# Patient Record
Sex: Male | Born: 1970
Health system: Southern US, Community
[De-identification: ages and names within clinical notes are randomized; demographics above are authoritative.]

## PROBLEM LIST (undated history)

## (undated) DIAGNOSIS — Z87442 Personal history of urinary calculi: Secondary | ICD-10-CM

## (undated) DIAGNOSIS — G8929 Other chronic pain: Secondary | ICD-10-CM

## (undated) DIAGNOSIS — C801 Malignant (primary) neoplasm, unspecified: Secondary | ICD-10-CM

## (undated) DIAGNOSIS — M5412 Radiculopathy, cervical region: Secondary | ICD-10-CM

## (undated) DIAGNOSIS — Z8489 Family history of other specified conditions: Secondary | ICD-10-CM

## (undated) DIAGNOSIS — R4689 Other symptoms and signs involving appearance and behavior: Secondary | ICD-10-CM

## (undated) DIAGNOSIS — N200 Calculus of kidney: Secondary | ICD-10-CM

## (undated) DIAGNOSIS — R011 Cardiac murmur, unspecified: Secondary | ICD-10-CM

## (undated) DIAGNOSIS — J45909 Unspecified asthma, uncomplicated: Secondary | ICD-10-CM

## (undated) DIAGNOSIS — F4310 Post-traumatic stress disorder, unspecified: Secondary | ICD-10-CM

## (undated) DIAGNOSIS — M549 Dorsalgia, unspecified: Secondary | ICD-10-CM

## (undated) HISTORY — PX: EPIDURAL BLOCK INJECTION: SHX1516

## (undated) HISTORY — DX: Other symptoms and signs involving appearance and behavior: R46.89

## (undated) HISTORY — PX: SHOULDER SURGERY: SHX246

## (undated) HISTORY — PX: COLONOSCOPY: SHX174

## (undated) MED FILL — Dexamethasone Sodium Phosphate Inj 100 MG/10ML: INTRAMUSCULAR | Qty: 1 | Status: AC

## (undated) MED FILL — Fosaprepitant Dimeglumine For IV Infusion 150 MG (Base Eq): INTRAVENOUS | Qty: 5 | Status: AC

---

## 2004-09-11 ENCOUNTER — Emergency Department (HOSPITAL_COMMUNITY): Admission: EM | Admit: 2004-09-11 | Discharge: 2004-09-11 | Payer: Self-pay | Admitting: Emergency Medicine

## 2006-11-21 ENCOUNTER — Emergency Department (HOSPITAL_COMMUNITY): Admission: EM | Admit: 2006-11-21 | Discharge: 2006-11-21 | Payer: Self-pay | Admitting: Family Medicine

## 2006-11-22 ENCOUNTER — Emergency Department (HOSPITAL_COMMUNITY): Admission: EM | Admit: 2006-11-22 | Discharge: 2006-11-22 | Payer: Self-pay | Admitting: Emergency Medicine

## 2008-08-27 ENCOUNTER — Emergency Department (HOSPITAL_COMMUNITY): Admission: EM | Admit: 2008-08-27 | Discharge: 2008-08-27 | Payer: Self-pay | Admitting: Emergency Medicine

## 2008-09-07 ENCOUNTER — Emergency Department (HOSPITAL_COMMUNITY): Admission: EM | Admit: 2008-09-07 | Discharge: 2008-09-07 | Payer: Self-pay | Admitting: Emergency Medicine

## 2008-11-12 ENCOUNTER — Emergency Department (HOSPITAL_COMMUNITY): Admission: EM | Admit: 2008-11-12 | Discharge: 2008-11-12 | Payer: Self-pay | Admitting: Emergency Medicine

## 2008-11-13 ENCOUNTER — Emergency Department (HOSPITAL_COMMUNITY): Admission: EM | Admit: 2008-11-13 | Discharge: 2008-11-13 | Payer: Self-pay | Admitting: Emergency Medicine

## 2009-06-23 ENCOUNTER — Emergency Department (HOSPITAL_COMMUNITY): Admission: EM | Admit: 2009-06-23 | Discharge: 2009-06-23 | Payer: Self-pay | Admitting: Emergency Medicine

## 2009-06-24 ENCOUNTER — Emergency Department (HOSPITAL_COMMUNITY): Admission: EM | Admit: 2009-06-24 | Discharge: 2009-06-24 | Payer: Self-pay | Admitting: Emergency Medicine

## 2009-08-02 ENCOUNTER — Emergency Department (HOSPITAL_COMMUNITY): Admission: EM | Admit: 2009-08-02 | Discharge: 2009-08-02 | Payer: Self-pay | Admitting: Internal Medicine

## 2010-06-06 ENCOUNTER — Emergency Department (HOSPITAL_COMMUNITY): Admission: EM | Admit: 2010-06-06 | Discharge: 2010-06-07 | Payer: Self-pay | Admitting: Emergency Medicine

## 2010-08-17 ENCOUNTER — Emergency Department (HOSPITAL_COMMUNITY): Admission: EM | Admit: 2010-08-17 | Discharge: 2010-08-17 | Payer: Self-pay | Admitting: Emergency Medicine

## 2010-08-20 ENCOUNTER — Emergency Department (HOSPITAL_COMMUNITY): Admission: EM | Admit: 2010-08-20 | Discharge: 2010-08-20 | Payer: Self-pay | Admitting: Emergency Medicine

## 2010-12-22 ENCOUNTER — Emergency Department (HOSPITAL_COMMUNITY)
Admission: EM | Admit: 2010-12-22 | Discharge: 2010-12-22 | Disposition: A | Discharge status: Home / Self Care | Payer: Self-pay | Attending: Emergency Medicine | Admitting: Emergency Medicine

## 2010-12-22 DIAGNOSIS — R079 Chest pain, unspecified: Secondary | ICD-10-CM | POA: Insufficient documentation

## 2010-12-22 DIAGNOSIS — M546 Pain in thoracic spine: Secondary | ICD-10-CM | POA: Insufficient documentation

## 2010-12-22 DIAGNOSIS — S239XXA Sprain of unspecified parts of thorax, initial encounter: Secondary | ICD-10-CM | POA: Insufficient documentation

## 2010-12-22 DIAGNOSIS — X500XXA Overexertion from strenuous movement or load, initial encounter: Secondary | ICD-10-CM | POA: Insufficient documentation

## 2010-12-22 DIAGNOSIS — J45909 Unspecified asthma, uncomplicated: Secondary | ICD-10-CM | POA: Insufficient documentation

## 2010-12-22 LAB — URINALYSIS, ROUTINE W REFLEX MICROSCOPIC
Bilirubin Urine: NEGATIVE
Hgb urine dipstick: NEGATIVE
Ketones, ur: NEGATIVE mg/dL
Nitrite: NEGATIVE
Protein, ur: NEGATIVE mg/dL
Specific Gravity, Urine: 1.022 (ref 1.005–1.030)
Urine Glucose, Fasting: NEGATIVE mg/dL
Urobilinogen, UA: 0.2 mg/dL (ref 0.0–1.0)
pH: 6 (ref 5.0–8.0)

## 2011-01-15 LAB — DIFFERENTIAL
Basophils Absolute: 0.1 10*3/uL (ref 0.0–0.1)
Basophils Relative: 1 % (ref 0–1)
Eosinophils Absolute: 0.3 10*3/uL (ref 0.0–0.7)
Eosinophils Relative: 4 % (ref 0–5)
Lymphocytes Relative: 46 % (ref 12–46)
Lymphs Abs: 3.5 10*3/uL (ref 0.7–4.0)
Monocytes Absolute: 0.8 10*3/uL (ref 0.1–1.0)
Monocytes Relative: 10 % (ref 3–12)
Neutro Abs: 3 10*3/uL (ref 1.7–7.7)
Neutrophils Relative %: 39 % — ABNORMAL LOW (ref 43–77)

## 2011-01-15 LAB — BASIC METABOLIC PANEL
BUN: 16 mg/dL (ref 6–23)
CO2: 29 mEq/L (ref 19–32)
Calcium: 9 mg/dL (ref 8.4–10.5)
Chloride: 103 mEq/L (ref 96–112)
Creatinine, Ser: 1.06 mg/dL (ref 0.4–1.5)
GFR calc Af Amer: >60 mL/min (ref >60)
GFR calc non Af Amer: >60 mL/min (ref >60)
Glucose, Bld: 98 mg/dL (ref 70–99)
Potassium: 4 mEq/L (ref 3.5–5.1)
Sodium: 137 mEq/L (ref 135–145)

## 2011-01-15 LAB — CBC
HCT: 40.2 % (ref 39.0–52.0)
Hemoglobin: 13.2 g/dL (ref 13.0–17.0)
MCH: 29.3 pg (ref 26.0–34.0)
MCHC: 32.8 g/dL (ref 30.0–36.0)
MCV: 89.1 fL (ref 78.0–100.0)
Platelets: 261 10*3/uL (ref 150–400)
RBC: 4.51 MIL/uL (ref 4.22–5.81)
RDW: 13.5 % (ref 11.5–15.5)
WBC: 7.7 10*3/uL (ref 4.0–10.5)

## 2011-01-15 LAB — ETHANOL: Alcohol, Ethyl (B): <5 mg/dL (ref 0–10)

## 2011-02-08 LAB — URINALYSIS, ROUTINE W REFLEX MICROSCOPIC
Bilirubin Urine: NEGATIVE
Glucose, UA: NEGATIVE mg/dL
Ketones, ur: 15 mg/dL — AB
Leukocytes, UA: NEGATIVE
Nitrite: NEGATIVE
Protein, ur: NEGATIVE mg/dL
Specific Gravity, Urine: 1.022 (ref 1.005–1.030)
Urobilinogen, UA: 0.2 mg/dL (ref 0.0–1.0)
pH: 5.5 (ref 5.0–8.0)

## 2011-02-08 LAB — URINE MICROSCOPIC-ADD ON

## 2011-09-28 ENCOUNTER — Emergency Department (HOSPITAL_COMMUNITY)
Admission: EM | Admit: 2011-09-28 | Discharge: 2011-09-28 | Disposition: A | Discharge status: Home / Self Care | Payer: Managed Care, Other (non HMO) | Attending: Emergency Medicine | Admitting: Emergency Medicine

## 2011-09-28 ENCOUNTER — Encounter: Payer: Self-pay | Admitting: *Deleted

## 2011-09-28 ENCOUNTER — Emergency Department (HOSPITAL_COMMUNITY): Payer: Managed Care, Other (non HMO)

## 2011-09-28 DIAGNOSIS — M19019 Primary osteoarthritis, unspecified shoulder: Secondary | ICD-10-CM

## 2011-09-28 DIAGNOSIS — M6580 Other synovitis and tenosynovitis, unspecified site: Secondary | ICD-10-CM | POA: Insufficient documentation

## 2011-09-28 DIAGNOSIS — M259 Joint disorder, unspecified: Secondary | ICD-10-CM | POA: Insufficient documentation

## 2011-09-28 DIAGNOSIS — M25529 Pain in unspecified elbow: Secondary | ICD-10-CM | POA: Insufficient documentation

## 2011-09-28 DIAGNOSIS — M25519 Pain in unspecified shoulder: Secondary | ICD-10-CM | POA: Insufficient documentation

## 2011-09-28 DIAGNOSIS — R296 Repeated falls: Secondary | ICD-10-CM | POA: Insufficient documentation

## 2011-09-28 DIAGNOSIS — M778 Other enthesopathies, not elsewhere classified: Secondary | ICD-10-CM

## 2011-09-28 MED ORDER — DIAZEPAM 5 MG PO TABS: 5.0000 mg | ORAL_TABLET | Freq: Three times a day (TID) | ORAL | Status: AC | PRN

## 2011-09-28 MED ORDER — OXYCODONE-ACETAMINOPHEN 5-325 MG PO TABS
1.0000 | ORAL_TABLET | Freq: Once | ORAL | Status: AC
  Administered 2011-09-28: 1 via ORAL
  Filled 2011-09-28: qty 1

## 2011-09-28 MED ORDER — IBUPROFEN 800 MG PO TABS
800.0000 mg | ORAL_TABLET | Freq: Once | ORAL | Status: AC
  Administered 2011-09-28: 800 mg via ORAL
  Filled 2011-09-28: qty 1

## 2011-09-28 MED ORDER — IBUPROFEN 800 MG PO TABS: 800.0000 mg | ORAL_TABLET | Freq: Three times a day (TID) | ORAL | Status: AC

## 2011-09-28 MED ORDER — OXYCODONE-ACETAMINOPHEN 5-325 MG PO TABS: 2.0000 | ORAL_TABLET | ORAL | Status: AC | PRN

## 2011-09-28 MED ORDER — DIAZEPAM 5 MG PO TABS
5.0000 mg | ORAL_TABLET | Freq: Once | ORAL | Status: AC
  Administered 2011-09-28: 5 mg via ORAL
  Filled 2011-09-28: qty 1

## 2011-09-28 NOTE — ED Notes (Signed)
Pt shows no signs of neuro deficits
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
## 2011-09-28 NOTE — ED Notes (Signed)
Patient with c/o bilateral arm pain.  He states that he fell backwards two days ago and his right elbow and left arm are painful.  Patient took two vicodin at home but didn't help

## 2011-11-04 DIAGNOSIS — J9819 Other pulmonary collapse: Secondary | ICD-10-CM

## 2011-11-04 HISTORY — DX: Other pulmonary collapse: J98.19

## 2012-02-03 ENCOUNTER — Emergency Department (HOSPITAL_COMMUNITY)
Admission: EM | Admit: 2012-02-03 | Discharge: 2012-02-04 | Disposition: A | Discharge status: Home / Self Care | Payer: Self-pay | Attending: Emergency Medicine | Admitting: Emergency Medicine

## 2012-02-03 ENCOUNTER — Encounter (HOSPITAL_COMMUNITY): Payer: Self-pay | Admitting: *Deleted

## 2012-02-03 DIAGNOSIS — X58XXXA Exposure to other specified factors, initial encounter: Secondary | ICD-10-CM | POA: Insufficient documentation

## 2012-02-03 DIAGNOSIS — M722 Plantar fascial fibromatosis: Secondary | ICD-10-CM | POA: Insufficient documentation

## 2012-02-03 DIAGNOSIS — S161XXA Strain of muscle, fascia and tendon at neck level, initial encounter: Secondary | ICD-10-CM

## 2012-02-03 DIAGNOSIS — F17200 Nicotine dependence, unspecified, uncomplicated: Secondary | ICD-10-CM | POA: Insufficient documentation

## 2012-02-03 DIAGNOSIS — S139XXA Sprain of joints and ligaments of unspecified parts of neck, initial encounter: Secondary | ICD-10-CM | POA: Insufficient documentation

## 2012-02-03 NOTE — ED Notes (Signed)
Neck back chest pain and many other symptoms.  Rapid speech not stopping

## 2012-02-03 NOTE — ED Notes (Signed)
The pt has had both feet pain for  2 months.

## 2012-02-03 NOTE — ED Notes (Addendum)
Pt comes to the ED with multiple complaints:  Pt complaining of foot pain bilaterally for the past month - area where pain is the worst is on the "pads" and the "arch".  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  Pt is also reporting neck pain and says that he is unable to turn his neck to the left for the past five days.  The chest pain has been going on for the past week and describes pain as "annoyingly sharp and pressure and roll around almost seems like gas".  He states that his left arm goes numb at night.

## 2012-02-04 MED ORDER — DIAZEPAM 5 MG PO TABS: 5.0000 mg | ORAL_TABLET | Freq: Two times a day (BID) | ORAL | Status: AC

## 2012-02-04 MED ORDER — NAPROXEN 500 MG PO TABS: 500.0000 mg | ORAL_TABLET | Freq: Two times a day (BID) | ORAL | Status: DC | PRN

## 2012-02-04 NOTE — Discharge Instructions (Signed)

## 2012-02-12 NOTE — ED Provider Notes (Signed)
History    41 year old male with chief complaint of bilateral foot pain. Has been bothering him intermittently for the past several months. Worse with the last 2 days. Has been seen previously for the same complaint diagnosis of plantar fascia is. Pain is in the soles of his feet. Is exacerbated with palpation and movement. Patient states that he spends several hours a day on his feet. Denies any acute trauma. No unusual swelling or rash. No fevers or chills. No nausea or vomiting. Patient is also complaining of right lateral neck pain. Patient again has a history of similar pain in the past. Again denies acute trauma. No numbness, tingling or loss of strength. Review of symptoms patient is also complaining of intermittent sharp chest pain. No appreciable exacerbating relieving factors. No shortness of breath. Abdominal pain. Diffuse and crampy, feels like gas. Intermittent mild headaches. Sometimes his left arm goes numb but he attributes this to ackward sleeping position at night.  CSN: [id number]  Arrival date & time 02/03/12  2136   First MD Initiated Contact with Patient 02/03/12 2350      Chief Complaint  Patient presents with  . multiple symptoms     (Consider location/radiation/quality/duration/timing/severity/associated sxs/prior treatment) HPI  History reviewed. No pertinent past medical history.  History reviewed. No pertinent past surgical history.  No family history on file.  History  Substance Use Topics  . Smoking status: Passive Smoker  . Smokeless tobacco: Not on file  . Alcohol Use: Yes      Review of Systems   Review of symptoms negative unless otherwise noted in HPI.   Allergies  Bee and Vancomycin  Home Medications   Current Outpatient Rx  Name Route Sig Dispense Refill  . CYCLOBENZAPRINE HCL 10 MG PO TABS Oral Take 5 mg by mouth 3 (three) times daily as needed. For pain    . DIAZEPAM 5 MG PO TABS Oral Take 10 mg by mouth every 6 (six) hours as  needed. For pain    . IBUPROFEN 200 MG PO TABS Oral Take 800 mg by mouth every 6 (six) hours as needed. For pain     . IBUPROFEN 800 MG PO TABS Oral Take 800 mg by mouth every 8 (eight) hours as needed. For pain    . OXYCODONE-ACETAMINOPHEN 5-325 MG PO TABS Oral Take 1 tablet by mouth every 4 (four) hours as needed. For pain    . DIAZEPAM 5 MG PO TABS Oral Take 1 tablet (5 mg total) by mouth 2 (two) times daily. 10 tablet 0  . NAPROXEN 500 MG PO TABS Oral Take 1 tablet (500 mg total) by mouth 2 (two) times daily as needed. 30 tablet 0    BP 128/93  Pulse 73  Temp(Src) 98 F (36.7 C) (Oral)  Resp 16  SpO2 100%  Physical Exam  Nursing note and vitals reviewed. Constitutional: He appears well-developed and well-nourished. No distress.       Laying in bed. No acute distress.  HENT:  Head: Normocephalic and atraumatic.  Right Ear: External ear normal.  Left Ear: External ear normal.  Nose: Nose normal.  Mouth/Throat: Oropharynx is clear and moist.       Long dreadlocks. Tenderness along the right lateral neck. Full range of motion. No overlying skin changes. No midline spinal tenderness.  Eyes: Conjunctivae and EOM are normal. Pupils are equal, round, and reactive to light. Right eye exhibits no discharge. Left eye exhibits no discharge.  Neck: Neck supple.  Cardiovascular: Normal rate,  regular rhythm and normal heart sounds.  Exam reveals no gallop and no friction rub.   No murmur heard. Pulmonary/Chest: Effort normal and breath sounds normal. No respiratory distress.  Abdominal: Soft. He exhibits no distension. There is no tenderness.  Musculoskeletal: He exhibits no edema and no tenderness.       Diffuse tenderness along the plantar surface of both feet from area at the distal calcaneus 2 heads of the metacarpals. No pedal edema. No tenderness or swelling dorsally. Good DP pulses bilaterally. No calf tenderness. Leg symmetric as compared to each other.  Neurological: He is alert.    Skin: Skin is warm and dry. He is not diaphoretic.  Psychiatric: He has a normal mood and affect. His behavior is normal. Thought content normal.    ED Course  Procedures (including critical care time)  Labs Reviewed - No data to display No results found.   1. Neck strain   2. Plantar fasciitis       MDM  41 year old male with multiple complaints. Chief complaint which seems to be bilateral foot pain. Suspect plantar fascia this. Patient has a history of this and has been treated previously. No evidence of infection. Doubt DVT. Patient's also complaining of neck pain which I believe is strain. There is no history of trauma. He has a nonfocal neurological examination. Review of systems is positive for many other things including chest pain, headaches, abdominal pain and others. Very low suspicion for emergent etiology of these. Will treat for possible plantar fasciitis and cervical strain. Return precautions were discussed. Outpatient followup.        Virgel Manifold, MD 02/12/12 1426

## 2012-06-24 ENCOUNTER — Emergency Department (HOSPITAL_COMMUNITY)
Admission: EM | Admit: 2012-06-24 | Discharge: 2012-06-24 | Disposition: A | Discharge status: Home / Self Care | Payer: Self-pay | Attending: Emergency Medicine | Admitting: Emergency Medicine

## 2012-06-24 ENCOUNTER — Encounter (HOSPITAL_COMMUNITY): Payer: Self-pay | Admitting: Emergency Medicine

## 2012-06-24 DIAGNOSIS — M62838 Other muscle spasm: Secondary | ICD-10-CM | POA: Insufficient documentation

## 2012-06-24 DIAGNOSIS — F17200 Nicotine dependence, unspecified, uncomplicated: Secondary | ICD-10-CM | POA: Insufficient documentation

## 2012-06-24 MED ORDER — METHOCARBAMOL 500 MG PO TABS
1000.0000 mg | ORAL_TABLET | Freq: Once | ORAL | Status: AC
Start: 2012-06-24 — End: 2012-06-24
  Administered 2012-06-24: 1000 mg via ORAL
  Filled 2012-06-24: qty 2

## 2012-06-24 MED ORDER — METHOCARBAMOL 750 MG PO TABS: 750.0000 mg | ORAL_TABLET | Freq: Two times a day (BID) | ORAL | Status: AC

## 2012-06-24 NOTE — ED Provider Notes (Signed)
History     CSN: [id number]  Arrival date & time 06/24/12  0311   First MD Initiated Contact with Patient 06/24/12 [phone]      Chief Complaint  Patient presents with  . Neck Pain    (Consider location/radiation/quality/duration/timing/severity/associated sxs/prior treatment) HPI Pt with a work related injury in 5/13 and has persistent back pain. Pt is being followed and prescribed percocet which are not relieving all of his pain. He has had persistent muscle tightness across back esp trapezius and rhomboid muscle groups. No new injury. No weakness or numbness.  History reviewed. No pertinent past medical history.  History reviewed. No pertinent past surgical history.  No family history on file.  History  Substance Use Topics  . Smoking status: Passive Smoker  . Smokeless tobacco: Not on file  . Alcohol Use: Yes      Review of Systems  Constitutional: Negative for fever and chills.  Gastrointestinal: Negative for nausea, vomiting and abdominal pain.  Musculoskeletal: Positive for myalgias and back pain.  Neurological: Negative for weakness and numbness.    Allergies  Nutritional supplements; Shrimp; and Vancomycin  Home Medications   Current Outpatient Rx  Name Route Sig Dispense Refill  . OXYCODONE-ACETAMINOPHEN 5-325 MG PO TABS Oral Take 1 tablet by mouth every 4 (four) hours as needed. For pain    . METHOCARBAMOL 750 MG PO TABS Oral Take 1 tablet (750 mg total) by mouth 2 (two) times daily. 20 tablet 0    BP 140/93  Temp 98 F (36.7 C) (Oral)  Resp 16  SpO2 97%  Physical Exam  Nursing note and vitals reviewed. Constitutional: He is oriented to person, place, and time. He appears well-developed and well-nourished. No distress.  HENT:  Head: Normocephalic and atraumatic.  Mouth/Throat: Oropharynx is clear and moist.  Eyes: EOM are normal. Pupils are equal, round, and reactive to light.  Neck: Normal range of motion. Neck supple.  Cardiovascular: Normal  rate and regular rhythm.   Pulmonary/Chest: Effort normal and breath sounds normal. No respiratory distress. He has no wheezes. He has no rales.  Abdominal: Soft. Bowel sounds are normal.  Musculoskeletal: Normal range of motion. He exhibits tenderness (TTP, and noted muscle spasms of bl Trap/rhomboids. no midline TTP. ). He exhibits no edema.  Neurological: He is alert and oriented to person, place, and time.       5/5 motor, sensation intact, normal ambulation.   Skin: Skin is warm and dry. No rash noted. No erythema.  Psychiatric: He has a normal mood and affect. His behavior is normal.    ED Course  Procedures (including critical care time)  Labs Reviewed - No data to display No results found.   1. Muscle spasm       MDM  Will treat and d/c with muscle relaxant         Julianne Rice, MD 06/24/12 [phone]

## 2012-06-24 NOTE — ED Notes (Signed)
PT. 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

## 2012-07-19 ENCOUNTER — Emergency Department (INDEPENDENT_AMBULATORY_CARE_PROVIDER_SITE_OTHER)
Admission: EM | Admit: 2012-07-19 | Discharge: 2012-07-19 | Disposition: A | Discharge status: Home / Self Care | Payer: Self-pay | Source: Home / Self Care | Attending: Family Medicine | Admitting: Family Medicine

## 2012-07-19 ENCOUNTER — Encounter (HOSPITAL_COMMUNITY): Payer: Self-pay | Admitting: *Deleted

## 2012-07-19 DIAGNOSIS — Z4802 Encounter for removal of sutures: Secondary | ICD-10-CM

## 2012-07-19 HISTORY — DX: Unspecified asthma, uncomplicated: J45.909

## 2012-07-19 NOTE — ED Provider Notes (Signed)
History     CSN: [id number]  Arrival date & time 07/19/12  1114   First MD Initiated Contact with Patient 07/19/12 1308      Chief Complaint  Patient presents with  . Suture / Staple Removal    (Consider location/radiation/quality/duration/timing/severity/associated sxs/prior treatment) Patient is a 41 y.o. male presenting with suture removal. The history is provided by the patient.  Suture / Staple Removal  The sutures were placed 7 to 10 days ago (placed by orthopedic surgeon but not removed at post op check, here for removal.). There has been no drainage from the wound. There is no redness present. There is no swelling present. The pain has no pain.    Past Medical History  Diagnosis Date  . Asthma     Past Surgical History  Procedure Date  . Shoulder surgery     History reviewed. No pertinent family history.  History  Substance Use Topics  . Smoking status: Passive Smoke Exposure - Never Smoker  . Smokeless tobacco: Not on file  . Alcohol Use: Yes      Review of Systems  Constitutional: Negative.     Allergies  Nutritional supplements; Shrimp; and Vancomycin  Home Medications   Current Outpatient Rx  Name Route Sig Dispense Refill  . METHOCARBAMOL 500 MG PO TABS Oral Take 500 mg by mouth 4 (four) times daily.    . OXYCODONE-ACETAMINOPHEN 5-325 MG PO TABS Oral Take 1 tablet by mouth every 4 (four) hours as needed. For pain      BP 124/63  Pulse 68  Temp 98.2 F (36.8 C) (Oral)  Resp 18  SpO2 100%  Physical Exam  Nursing note and vitals reviewed. Constitutional: He appears well-developed and well-nourished.  Skin: Skin is warm and dry.       2 cm well healed incision over left clavicle, apparently sub cuticular closure, no sutures palpable, end of incision opened , probed under magnification, unable to locate any suture material.     ED Course  Procedures (including critical care time)  Labs Reviewed - No data to display No results  found.   1. Visit for suture removal       MDM  Pt unhappy with service as we were unable to locate sutures, pt doesn't feel that we believed him or didn't look hard enough for stitches. This is untrue, I was not able to locate them.        Billy Fischer, MD 07/19/12 [phone]

## 2012-07-19 NOTE — ED Notes (Signed)
Pt is here for suture removal of the left shoulder.    He had shoulder surgery in [place] 07-09-2012.   Wound of the left anterior shoulder healing well

## 2012-07-19 NOTE — ED Notes (Signed)
In with Dr Juventino Slovak while he was attempting to remove sutures.  Pt stated he went to surgeon on 07/13/12 and he stated the sutures were not ready to be removed.  Pt states that there were 2 pieces of suture material hanging out at bottom of sutured laceration.  Dr Juventino Slovak used lighted magnifying headlamp to attempt to remove sutures.  Dr Juventino Slovak used 15 lade to cut small incision at bottom of original incision.  Dr Juventino Slovak then used tweezers to attempt to locate end of suture material.  He was unable to see or feel any suture material.  He explained to patient that he could not see anything in laceration.  Patient again stating that there are sutures there that need to be removed.  Dr Juventino Slovak again explained to patient that he could not find anything to be removed.  Patient and wife became hostile.  Patient stated that Dr Juventino Slovak did not look for sutures.  Dr Juventino Slovak tried multiple times to explain to patient that he did open previous suturing site where patient stated loose ends were hanging out.  Patient put shirt on and left the room.  Patient very irritated and unable to be spoken with.

## 2012-08-30 ENCOUNTER — Encounter (HOSPITAL_COMMUNITY): Payer: Self-pay | Admitting: Cardiology

## 2012-08-30 ENCOUNTER — Emergency Department (HOSPITAL_COMMUNITY)
Admission: EM | Admit: 2012-08-30 | Discharge: 2012-08-30 | Disposition: A | Discharge status: Home / Self Care | Payer: Self-pay | Attending: Emergency Medicine | Admitting: Emergency Medicine

## 2012-08-30 DIAGNOSIS — Z8781 Personal history of (healed) traumatic fracture: Secondary | ICD-10-CM | POA: Insufficient documentation

## 2012-08-30 DIAGNOSIS — M549 Dorsalgia, unspecified: Secondary | ICD-10-CM | POA: Insufficient documentation

## 2012-08-30 DIAGNOSIS — J45909 Unspecified asthma, uncomplicated: Secondary | ICD-10-CM | POA: Insufficient documentation

## 2012-08-30 DIAGNOSIS — Z9889 Other specified postprocedural states: Secondary | ICD-10-CM | POA: Insufficient documentation

## 2012-08-30 DIAGNOSIS — Z79899 Other long term (current) drug therapy: Secondary | ICD-10-CM | POA: Insufficient documentation

## 2012-08-30 MED ORDER — OXYCODONE-ACETAMINOPHEN 5-325 MG PO TABS: 1.0000 | ORAL_TABLET | Freq: Four times a day (QID) | ORAL | Status: DC | PRN

## 2012-08-30 NOTE — ED Notes (Signed)
Pt reports back pain that started back in may after having a forklift dropped on his back. Reports he has a ruptured disc. Reports he ran out of his pain medication and has not been able to deal with the pain.

## 2012-08-30 NOTE — Progress Notes (Signed)
Orthopedic Tech Progress Note Patient Details:  Bradley Colon 1971-04-01 [id number]  Ortho Devices Ortho Device/Splint Location: lumbar support  Ortho Device/Splint Interventions: Application   Cammer, Theodoro Parma 08/30/2012, 3:48 PM
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
## 2012-08-30 NOTE — ED Notes (Addendum)
Pt had injury in May involving a forklift. He had back sx in [place] and goes back and forth to [place] to see that Dr. Abbott Pao states he ran out of pain meds Friday. Has appointment in [place] tomorrow but is unable to tolerate the ride on the bus.

## 2012-09-29 ENCOUNTER — Emergency Department (HOSPITAL_COMMUNITY)
Admission: EM | Admit: 2012-09-29 | Discharge: 2012-09-29 | Disposition: A | Discharge status: Home / Self Care | Payer: Self-pay | Attending: Emergency Medicine | Admitting: Emergency Medicine

## 2012-09-29 ENCOUNTER — Encounter (HOSPITAL_COMMUNITY): Payer: Self-pay | Admitting: *Deleted

## 2012-09-29 DIAGNOSIS — R079 Chest pain, unspecified: Secondary | ICD-10-CM | POA: Insufficient documentation

## 2012-09-29 DIAGNOSIS — M79609 Pain in unspecified limb: Secondary | ICD-10-CM | POA: Insufficient documentation

## 2012-09-29 DIAGNOSIS — G8929 Other chronic pain: Secondary | ICD-10-CM | POA: Insufficient documentation

## 2012-09-29 MED ORDER — OXYCODONE-ACETAMINOPHEN 5-325 MG PO TABS: 2.0000 | ORAL_TABLET | ORAL | Status: DC | PRN

## 2012-09-29 NOTE — ED Notes (Signed)
Pt states he took percocet 3 hrs ago

## 2012-09-29 NOTE — ED Provider Notes (Signed)
Pt angry because routine refill of Percocet not prescribed by initial EDP and requested opinion from another EDP while in ED, Pt admits and reminded by me that he has chronic pain with management best provided by his chronic pain doctor and no [facility], no mandate to prescribe narcotics in ED, will prescribe #6 tabs Percocet only and Pt to not expect refills of narcotics in ED for chronic pain in future.  Babette Relic, MD 09/30/12 2006

## 2012-09-29 NOTE — ED Notes (Signed)
Patient states he takes percocet at home and has taken it earlier.

## 2012-09-29 NOTE — ED Provider Notes (Signed)
History     CSN: [id number]  Arrival date & time 09/29/12  1656   First MD Initiated Contact with Patient 09/29/12 1739      Chief Complaint  Patient presents with  . Back Pain  . Chest Pain    (Consider location/radiation/quality/duration/timing/severity/associated sxs/prior treatment) HPI Complains of pain to back chest and legs for several months since involved in an accident. Patient reports he's run out of Percocet he is visiting from [place]. Last used Percocet today lasted 3 hours. Pain is worse with movement improved with rest Past Medical History  Diagnosis Date  . Asthma    Duration pain is improved with remaining still no fever no new injury no other complaint Past Surgical History  Procedure Date  . Shoulder surgery     No family history on file.  History  Substance Use Topics  . Smoking status: Passive Smoke Exposure - Never Smoker  . Smokeless tobacco: Not on file  . Alcohol Use: Yes      Review of Systems  Constitutional: Negative.   HENT: Negative.   Respiratory: Negative.   Cardiovascular: Positive for chest pain.  Gastrointestinal: Negative.   Musculoskeletal: Positive for back pain and arthralgias.  Skin: Negative.   Neurological: Negative.   Hematological: Negative.   Psychiatric/Behavioral: Negative.   All other systems reviewed and are negative.    Allergies  Bee pollen; Shrimp; Vancomycin; and Wheat bran  Home Medications   Current Outpatient Rx  Name  Route  Sig  Dispense  Refill  . IBUPROFEN 800 MG PO TABS   Oral   Take 800 mg by mouth 2 (two) times daily as needed. For pain         . METHOCARBAMOL 750 MG PO TABS   Oral   Take 1,500 mg by mouth daily as needed. For muscle spasms.         . OXYCODONE-ACETAMINOPHEN 5-325 MG PO TABS   Oral   Take 1 tablet by mouth every 6 (six) hours as needed. For pain   20 tablet   0     BP 151/83  Pulse 70  Temp 98.1 F (36.7 C) (Oral)  Resp 18  SpO2 100%  Physical  Exam  Nursing note and vitals reviewed. Constitutional: He is oriented to person, place, and time. He appears well-developed and well-nourished.  HENT:  Head: Normocephalic and atraumatic.  Eyes: Conjunctivae normal are normal. Pupils are equal, round, and reactive to light.  Neck: Neck supple. No tracheal deviation present. No thyromegaly present.  Cardiovascular: Normal rate and regular rhythm.   No murmur heard. Pulmonary/Chest: Effort normal and breath sounds normal.  Abdominal: Soft. Bowel sounds are normal. He exhibits no distension. There is no tenderness.  Musculoskeletal: Normal range of motion. He exhibits no edema and no tenderness.  Neurological: He is alert and oriented to person, place, and time. Coordination normal.       Gait normal  Skin: Skin is warm and dry. No rash noted.  Psychiatric: He has a normal mood and affect.       Argumentative    ED Course  Procedures (including critical care time) Patient suggested to try to call his pain management clinic which I offered to do. They're unavailable at this hour and closed Labs Reviewed - No data to display No results found.  Patient was argumentative and threaten me with calling a lawyer.  1. Chronic pain       MDM  I suspect drug-seeking behavior. He's  had multiple ED visits at [facility] with prescriptions for narcotics over the past few months No prescription written Diagnosis chronic pain        Orlie Dakin, MD 09/29/12 1812

## 2012-09-29 NOTE — ED Notes (Signed)
To ED for eval continued arm, upper back, chest, and jaw pain. States he has had this same pain since May 23rd when he had a boat fall from a crane onto him. Skin w/d, resp e/u.

## 2013-03-15 ENCOUNTER — Emergency Department (HOSPITAL_COMMUNITY)
Admission: EM | Admit: 2013-03-15 | Discharge: 2013-03-15 | Disposition: A | Discharge status: Home / Self Care | Payer: Self-pay | Attending: Emergency Medicine | Admitting: Emergency Medicine

## 2013-03-15 ENCOUNTER — Encounter (HOSPITAL_COMMUNITY): Payer: Self-pay

## 2013-03-15 DIAGNOSIS — M542 Cervicalgia: Secondary | ICD-10-CM

## 2013-03-15 DIAGNOSIS — J45909 Unspecified asthma, uncomplicated: Secondary | ICD-10-CM | POA: Insufficient documentation

## 2013-03-15 DIAGNOSIS — Z87828 Personal history of other (healed) physical injury and trauma: Secondary | ICD-10-CM | POA: Insufficient documentation

## 2013-03-15 DIAGNOSIS — G8929 Other chronic pain: Secondary | ICD-10-CM

## 2013-03-15 DIAGNOSIS — Z79899 Other long term (current) drug therapy: Secondary | ICD-10-CM | POA: Insufficient documentation

## 2013-03-15 DIAGNOSIS — G8921 Chronic pain due to trauma: Secondary | ICD-10-CM | POA: Insufficient documentation

## 2013-03-15 DIAGNOSIS — M79609 Pain in unspecified limb: Secondary | ICD-10-CM | POA: Insufficient documentation

## 2013-03-15 MED ORDER — HYDROCODONE-ACETAMINOPHEN 5-325 MG PO TABS: ORAL_TABLET | ORAL | Status: DC

## 2013-03-15 MED ORDER — IBUPROFEN 800 MG PO TABS: 800.0000 mg | ORAL_TABLET | Freq: Three times a day (TID) | ORAL | Status: DC

## 2013-03-15 NOTE — ED Notes (Signed)
Pt c/o chronic posterior neck pain and bilateral arm pain d/t an injury 1 year ago. Pt reports having his neck stretched at physical therapy today w/no relief

## 2013-03-15 NOTE — ED Provider Notes (Signed)
History  This chart was scribed for non-physician practitioner Noland Fordyce, PA-C working with Julianne Rice, MD, by Truddie Coco, ED Scribe. This patient was seen in room TR08C/TR08C and the patient's care was started at 6:08 PM   CSN: [id number]  Arrival date & time 03/15/13  1648   First MD Initiated Contact with Patient 03/15/13 1744      Chief Complaint  Patient presents with  . Neck Pain     The history is provided by the patient. No language interpreter was used.   HPI Comments: Bradley Colon is a 42 y.o. male who presents to the Emergency Department complaining of chronic posterior neck pain associated with an injury that occurred one year ago which gradually worsened over the last three weeks.  He reports he has been seeing a physical therapist with no releif.   He reports the pain is sharp and throbbing, 6/10, and is now radiating down bilateral arms.  Pt has applied heat and taken ibuprofen with no relief.  He reports the pain is worse when waking and moving after rest.  Pt was prescribed percocet in the past which improved symptoms.  He reports his orthopaedist is in [place] and he cannot follow up with him for another three weeks.      Past Medical History  Diagnosis Date  . Asthma     Past Surgical History  Procedure Laterality Date  . Shoulder surgery      History reviewed. No pertinent family history.  History  Substance Use Topics  . Smoking status: Passive Smoke Exposure - Never Smoker  . Smokeless tobacco: Not on file  . Alcohol Use: Yes      Review of Systems  HENT: Positive for neck pain (posterior neck pain).   Musculoskeletal: Positive for arthralgias (bilateral arm pain).  All other systems reviewed and are negative.    Allergies  Bee pollen; Shrimp; Vancomycin; and Wheat bran  Home Medications   Current Outpatient Rx  Name  Route  Sig  Dispense  Refill  . Ibuprofen (IBU PO)   Oral   Take 4 tablets by mouth 2 (two) times daily as  needed (pain).         Marland Kitchen tiZANidine (ZANAFLEX) 4 MG tablet   Oral   Take 4 mg by mouth every 6 (six) hours as needed (muscle spasms).         Marland Kitchen HYDROcodone-acetaminophen (NORCO/VICODIN) 5-325 MG per tablet      Take 1-2 pills every 4-6 hours as needed for pain.   6 tablet   0   . ibuprofen (ADVIL,MOTRIN) 800 MG tablet   Oral   Take 1 tablet (800 mg total) by mouth 3 (three) times daily.   21 tablet   0     BP 138/99  Pulse 71  Temp(Src) 98.2 F (36.8 C) (Oral)  Resp 18  SpO2 100%  Physical Exam  Nursing note and vitals reviewed. Constitutional: He is oriented to person, place, and time. He appears well-developed and well-nourished. No distress.  HENT:  Head: Normocephalic and atraumatic.  Eyes: EOM are normal.  Neck: Normal range of motion. Neck supple. No tracheal deviation present.  No midline cervical tenderness.  Cervical para spinal tenderness.    Cardiovascular: Normal rate.   Pulmonary/Chest: Effort normal. No respiratory distress.  Musculoskeletal: Normal range of motion. He exhibits tenderness ( along upper trapezius muscle).  Neurological: He is alert and oriented to person, place, and time.  Skin: Skin is warm and  dry.  Psychiatric: He has a normal mood and affect. His behavior is normal.    ED Course  Procedures   DIAGNOSTIC STUDIES: Oxygen Saturation is 100% on room air, normal by my interpretation.    COORDINATION OF CARE:  6:17 PM Discussed course of care with pt which includes pain medication.  Will provide referral for local orthopaedist.  Pt understands and agrees.   Labs Reviewed - No data to display No results found.   1. Chronic neck pain       MDM  Pt with chronic back and neck pain c/o exacerbation of neck pain after receiving PT for his pain.  States he has orthopedist in [place] but cannot f/u with him for percocet rx for another 3 weeks.  Do not believe imaging would be of benefit at this time.  Rx: norco and ibuprofen.  Provided  contact info to be referred to a PCP, [facility] for chronic neck and back pain.   I personally performed the services described in this documentation, which was scribed in my presence. The recorded information has been reviewed and is accurate.      Noland Fordyce, PA-C 03/16/13 [phone]

## 2013-03-15 NOTE — ED Notes (Signed)
Pt states he took 2400 mg of ibprofen today.

## 2013-03-17 NOTE — ED Provider Notes (Signed)
Medical screening examination/treatment/procedure(s) were performed by non-physician practitioner and as supervising physician I was immediately available for consultation/collaboration.   Julianne Rice, MD 03/17/13 [phone]

## 2013-05-05 ENCOUNTER — Encounter (HOSPITAL_COMMUNITY): Payer: Self-pay | Admitting: *Deleted

## 2013-05-05 DIAGNOSIS — J45909 Unspecified asthma, uncomplicated: Secondary | ICD-10-CM | POA: Insufficient documentation

## 2013-05-05 DIAGNOSIS — Z87828 Personal history of other (healed) physical injury and trauma: Secondary | ICD-10-CM | POA: Insufficient documentation

## 2013-05-05 DIAGNOSIS — M542 Cervicalgia: Secondary | ICD-10-CM | POA: Insufficient documentation

## 2013-05-05 DIAGNOSIS — G8929 Other chronic pain: Secondary | ICD-10-CM | POA: Insufficient documentation

## 2013-05-05 DIAGNOSIS — Z791 Long term (current) use of non-steroidal anti-inflammatories (NSAID): Secondary | ICD-10-CM | POA: Insufficient documentation

## 2013-05-05 DIAGNOSIS — M5412 Radiculopathy, cervical region: Secondary | ICD-10-CM | POA: Insufficient documentation

## 2013-05-05 NOTE — ED Notes (Signed)
The pt has had neck pain for 1-2 weeks and for the past few days he has had some numbness in his rt fingers

## 2013-05-06 ENCOUNTER — Emergency Department (HOSPITAL_COMMUNITY): Payer: Self-pay

## 2013-05-06 ENCOUNTER — Emergency Department (HOSPITAL_COMMUNITY)
Admission: EM | Admit: 2013-05-06 | Discharge: 2013-05-06 | Disposition: A | Discharge status: Home / Self Care | Payer: Self-pay | Attending: Emergency Medicine | Admitting: Emergency Medicine

## 2013-05-06 DIAGNOSIS — M5412 Radiculopathy, cervical region: Secondary | ICD-10-CM

## 2013-05-06 MED ORDER — HYDROCODONE-ACETAMINOPHEN 5-325 MG PO TABS
1.0000 | ORAL_TABLET | Freq: Once | ORAL | Status: DC
  Filled 2013-05-06: qty 1

## 2013-05-06 MED ORDER — HYDROCODONE-ACETAMINOPHEN 5-325 MG PO TABS: ORAL_TABLET | ORAL | Status: DC

## 2013-05-06 NOTE — ED Provider Notes (Signed)
History    CSN: [id number] Arrival date & time 05/05/13  2338  First MD Initiated Contact with Patient 05/06/13 0045     Chief Complaint  Patient presents with  . Torticollis   (Consider location/radiation/quality/duration/timing/severity/associated sxs/prior Treatment) HPI  Zylin Kuhnke is a 42 y.o. male complaining of exacerbation of chronic neck pain. Patient has suffered an injury last year in [place] he has a workers comp case pending. He has an appointment with a local pain clinic in the next 2 weeks. He states his pain is severe, exacerbated by lying flat. He states that he has a pins and needles paresthesia to the second third digit on both hands. This has been happening over the last 2 days. He's been taking Norco and ibuprofen with little relief. He denies any new trauma, weakness.   Past Medical History  Diagnosis Date  . Asthma    Past Surgical History  Procedure Laterality Date  . Shoulder surgery     No family history on file. History  Substance Use Topics  . Smoking status: Passive Smoke Exposure - Never Smoker  . Smokeless tobacco: Not on file  . Alcohol Use: Yes    Review of Systems  Constitutional:       Negative except as described in HPI  HENT:       Negative except as described in HPI  Respiratory:       Negative except as described in HPI  Cardiovascular:       Negative except as described in HPI  Gastrointestinal:       Negative except as described in HPI  Genitourinary:       Negative except as described in HPI  Musculoskeletal:       Negative except as described in HPI  Skin:       Negative except as described in HPI  Neurological:       Negative except as described in HPI  All other systems reviewed and are negative.    Allergies  Bee pollen; Shrimp; Vancomycin; and Wheat bran  Home Medications   Current Outpatient Rx  Name  Route  Sig  Dispense  Refill  . HYDROcodone-acetaminophen (NORCO/VICODIN) 5-325 MG per tablet     Take 1-2 pills every 4-6 hours as needed for pain.   6 tablet   0   . ibuprofen (ADVIL,MOTRIN) 800 MG tablet   Oral   Take 1 tablet (800 mg total) by mouth 3 (three) times daily.   21 tablet   0   . tiZANidine (ZANAFLEX) 4 MG tablet   Oral   Take 4 mg by mouth every 6 (six) hours as needed (muscle spasms).          BP 127/88  Pulse 68  Temp(Src) 98.8 F (37.1 C)  Resp 18  SpO2 98% Physical Exam  Nursing note and vitals reviewed. Constitutional: He is oriented to person, place, and time. He appears well-developed and well-nourished. No distress.  HENT:  Head: Normocephalic.  Eyes: Conjunctivae and EOM are normal.  Cardiovascular: Normal rate.   Pulmonary/Chest: Effort normal. No stridor.  Musculoskeletal: Normal range of motion.  Neurological: He is alert and oriented to person, place, and time.  Strength is 5 out of 5x4 extremities, upper tremor these show 5 out of 5 strength in biceps, triceps and grip strength. Patient is able to distinguish between pinprick and light touch in all nerve distributions to the upper extremities.  Psychiatric: He has a normal mood and affect.  ED Course  Procedures (including critical care time) Labs Reviewed - No data to display No results found. 1. Cervical radiculopathy     MDM   Filed Vitals:   05/05/13 2348  BP: 127/88  Pulse: 68  Temp: 98.8 F (37.1 C)  Resp: 18  SpO2: 98%     Mathan Bernales is a 42 y.o. male with chronic cervicalgia complaining of paresthesia to bilateral hands. There is no weakness in patient has normal sensation bilaterally. Doubt cord compression. I have asked patient to followup with his appointment her pain management and orthopedic surgery  Pt is hemodynamically stable, appropriate for, and amenable to discharge at this time. Pt verbalized understanding and agrees with care plan. Outpatient follow-up and specific return precautions discussed.    New Prescriptions   HYDROCODONE-ACETAMINOPHEN  (NORCO/VICODIN) 5-325 MG PER TABLET    Take 1-2 tablets by mouth every 6 hours as needed for pain.     Monico Blitz, PA-C 05/06/13 [phone]

## 2013-05-06 NOTE — ED Provider Notes (Signed)
Medical screening examination/treatment/procedure(s) were performed by non-physician practitioner and as supervising physician I was immediately available for consultation/collaboration.    Johnna Acosta, MD 05/06/13 [phone]

## 2013-07-03 ENCOUNTER — Encounter (HOSPITAL_COMMUNITY): Payer: Self-pay | Admitting: Emergency Medicine

## 2013-07-03 ENCOUNTER — Emergency Department (HOSPITAL_COMMUNITY): Payer: Self-pay

## 2013-07-03 ENCOUNTER — Emergency Department (HOSPITAL_COMMUNITY)
Admission: EM | Admit: 2013-07-03 | Discharge: 2013-07-03 | Disposition: A | Discharge status: Home / Self Care | Payer: Self-pay | Attending: Emergency Medicine | Admitting: Emergency Medicine

## 2013-07-03 DIAGNOSIS — J45909 Unspecified asthma, uncomplicated: Secondary | ICD-10-CM | POA: Insufficient documentation

## 2013-07-03 DIAGNOSIS — M549 Dorsalgia, unspecified: Secondary | ICD-10-CM | POA: Insufficient documentation

## 2013-07-03 DIAGNOSIS — R3915 Urgency of urination: Secondary | ICD-10-CM | POA: Insufficient documentation

## 2013-07-03 DIAGNOSIS — Z91040 Latex allergy status: Secondary | ICD-10-CM | POA: Insufficient documentation

## 2013-07-03 DIAGNOSIS — Z791 Long term (current) use of non-steroidal anti-inflammatories (NSAID): Secondary | ICD-10-CM | POA: Insufficient documentation

## 2013-07-03 DIAGNOSIS — Z87442 Personal history of urinary calculi: Secondary | ICD-10-CM | POA: Insufficient documentation

## 2013-07-03 HISTORY — DX: Calculus of kidney: N20.0

## 2013-07-03 LAB — CBC WITH DIFFERENTIAL/PLATELET
Basophils Relative: 0 % (ref 0–1)
Eosinophils Absolute: 0.2 10*3/uL (ref 0.0–0.7)
Eosinophils Relative: 3 % (ref 0–5)
HCT: 39.6 % (ref 39.0–52.0)
Hemoglobin: 13.8 g/dL (ref 13.0–17.0)
MCH: 30.1 pg (ref 26.0–34.0)
MCHC: 34.8 g/dL (ref 30.0–36.0)
MCV: 86.3 fL (ref 78.0–100.0)
Monocytes Absolute: 0.7 10*3/uL (ref 0.1–1.0)
Monocytes Relative: 9 % (ref 3–12)
Neutro Abs: 4.5 10*3/uL (ref 1.7–7.7)

## 2013-07-03 LAB — COMPREHENSIVE METABOLIC PANEL
ALT: 20 U/L (ref 0–53)
AST: 19 U/L (ref 0–37)
Calcium: 9.3 mg/dL (ref 8.4–10.5)
Potassium: 4.3 mEq/L (ref 3.5–5.1)
Sodium: 138 mEq/L (ref 135–145)
Total Protein: 6.7 g/dL (ref 6.0–8.3)

## 2013-07-03 LAB — URINALYSIS, ROUTINE W REFLEX MICROSCOPIC
Bilirubin Urine: NEGATIVE
Hgb urine dipstick: NEGATIVE
Specific Gravity, Urine: 1.02 (ref 1.005–1.030)
Urobilinogen, UA: 0.2 mg/dL (ref 0.0–1.0)

## 2013-07-03 MED ORDER — ONDANSETRON HCL 4 MG/2ML IJ SOLN
4.0000 mg | Freq: Once | INTRAMUSCULAR | Status: AC
  Administered 2013-07-03: 4 mg via INTRAVENOUS
  Filled 2013-07-03: qty 2

## 2013-07-03 MED ORDER — IBUPROFEN 800 MG PO TABS: 800.0000 mg | ORAL_TABLET | Freq: Three times a day (TID) | ORAL | Status: DC

## 2013-07-03 MED ORDER — MORPHINE SULFATE 4 MG/ML IJ SOLN
4.0000 mg | Freq: Once | INTRAMUSCULAR | Status: AC
  Administered 2013-07-03: 4 mg via INTRAVENOUS
  Filled 2013-07-03: qty 1

## 2013-07-03 MED ORDER — SODIUM CHLORIDE 0.9 % IV BOLUS (SEPSIS)
1000.0000 mL | Freq: Once | INTRAVENOUS | Status: AC
  Administered 2013-07-03: 1000 mL via INTRAVENOUS

## 2013-07-03 NOTE — Progress Notes (Signed)
Met patient at bedside.Role of case manager explained.Patient verbalizes his understanding.Patient reports increased flank pain as reason for ED visit.Patient reports he does not have a PCP. Patient provided with resource sheet for the [facility] clinic and urgent care center.Patient claims when he tried to see a Dr last week at the clinic they would not see him.I advised patient I  Would follow up and e-mail clinic. Patient reports multiple social Issues.Aldona Bar social worker informed of patient Issues.Patient provided with verbal /written education for [employer] And a discount card for the [employer].Patient provided with information on the Interactive resource center in [place].No further Case Manager issues identified.

## 2013-07-03 NOTE — ED Notes (Signed)
Patient presents to ED with complaints of right flank pain since 5 am this morning. Patient took 2  5mg  oxy this am without relief. Patient has history of kidney stones.

## 2013-07-03 NOTE — ED Provider Notes (Signed)
CSN: [id number]     Arrival date & time 07/03/13  [id number] History   First MD Initiated Contact with Patient 07/03/13 [phone]     Chief Complaint  Patient presents with  . Flank Pain   (Consider location/radiation/quality/duration/timing/severity/associated sxs/prior Treatment) HPI This is a 42 year old male history of chronic neck pain who presents with left flank pain. Patient states he has not been eating well this week and is dehydrated. He had onset of left flank pain last night. He thought it was his chronic pain. He took oxycodone without any relief. He last took 2 oxycodone at 6:30 AM but continues to have pain. He describes his pain as pinching and nonradiating.  Rates his pain at 10/10.  He states his pain is worse with movement and breathing. He endorses nausea without vomiting.  The patient has a history of kidney stones and states that this is somewhat similar. He endorses decreased urine output and darker urine. He denies any fevers, headache, chest pain, shortness of breath, focal weakness or numbness. Past Medical History  Diagnosis Date  . Asthma   . Kidney stones    Past Surgical History  Procedure Laterality Date  . Shoulder surgery     History reviewed. No pertinent family history. History  Substance Use Topics  . Smoking status: Passive Smoke Exposure - Never Smoker  . Smokeless tobacco: Not on file  . Alcohol Use: Yes    Review of Systems  Constitutional: Negative.  Negative for fever.  Respiratory: Negative.  Negative for chest tightness and shortness of breath.   Cardiovascular: Negative.  Negative for chest pain.  Gastrointestinal: Positive for nausea. Negative for vomiting, abdominal pain and diarrhea.  Genitourinary: Positive for urgency, flank pain and decreased urine volume. Negative for testicular pain.  Musculoskeletal: Positive for back pain.  Skin: Negative for wound.  Neurological: Negative for headaches.  Psychiatric/Behavioral: The patient is not  nervous/anxious.   All other systems reviewed and are negative.    Allergies  Bee pollen; Latex; Shrimp; Vancomycin; and Wheat bran  Home Medications   Current Outpatient Rx  Name  Route  Sig  Dispense  Refill  . HYDROcodone-acetaminophen (NORCO/VICODIN) 5-325 MG per tablet   Oral   Take 1 tablet by mouth every 6 (six) hours as needed for pain.         Marland Kitchen oxyCODONE-acetaminophen (PERCOCET/ROXICET) 5-325 MG per tablet   Oral   Take 1 tablet by mouth every 4 (four) hours as needed for pain.         Marland Kitchen tiZANidine (ZANAFLEX) 4 MG tablet   Oral   Take 4 mg by mouth every 6 (six) hours as needed (muscle spasms).         Marland Kitchen ibuprofen (ADVIL,MOTRIN) 800 MG tablet   Oral   Take 1 tablet (800 mg total) by mouth 3 (three) times daily.   21 tablet   0    BP 116/81  Pulse 54  Temp(Src) 98.7 F (37.1 C) (Oral)  Resp 18  Ht 5\' 8"  (1.727 m)  Wt 185 lb (83.915 kg)  BMI 28.14 kg/m2  SpO2 100% Physical Exam  Nursing note and vitals reviewed. Constitutional: He is oriented to person, place, and time. He appears well-developed and well-nourished. He appears distressed.  Patient appears uncomfortable.   HENT:  Head: Normocephalic and atraumatic.  Eyes: Pupils are equal, round, and reactive to light.  Neck: Neck supple.  Cardiovascular: Normal rate, regular rhythm and normal heart sounds.   No murmur heard. Pulmonary/Chest:  Effort normal and breath sounds normal. No respiratory distress. He has no wheezes.  Abdominal: Soft. Bowel sounds are normal. There is no tenderness. There is no rebound.  No CVA tenderness noted  Musculoskeletal: He exhibits no edema.  Lymphadenopathy:    He has no cervical adenopathy.  Neurological: He is alert and oriented to person, place, and time.  Skin: Skin is warm and dry.  Psychiatric: He has a normal mood and affect.    ED Course  Procedures (including critical care time) Labs Review Labs Reviewed  COMPREHENSIVE METABOLIC PANEL - Abnormal;  Notable for the following:    Glucose, Bld 159 (*)    GFR calc non Af Amer 76 (*)    GFR calc Af Amer 88 (*)    All other components within normal limits  CBC WITH DIFFERENTIAL  URINALYSIS, ROUTINE W REFLEX MICROSCOPIC   Imaging Review Ct Abdomen Pelvis Wo Contrast  07/03/2013   *RADIOLOGY REPORT*  Clinical Data: Flank pain, nausea, history of nephrolithiasis  CT ABDOMEN AND PELVIS WITHOUT CONTRAST  Technique:  Multidetector CT imaging of the abdomen and pelvis was performed following the standard protocol without intravenous contrast.  Comparison: 06/23/2009  Findings: Minor left greater than right basilar atelectasis. Normal heart size.  No pericardial or pleural effusion.  Abdomen:  Kidneys demonstrate no acute perinephric edema, obstruction, hydronephrosis, or obstructing ureteral calculus on either side.  Ureters are decompressed and symmetric.  Liver, collapsed gallbladder, biliary system, pancreas, spleen, and adrenal glands demonstrate no acute process by noncontrast imaging.  Negative for bowel obstruction, ileus, or free air.  Nonspecific colonic distention of the transverse colon.  No abdominal free fluid, fluid collection, hemorrhage, abscess, or adenopathy.  Minor calcific atherosclerosis of the aortic bifurcation.  Negative for aneurysm.  Pelvis:  Normal appendix demonstrated.  No acute distal bowel process.  No pelvic free fluid, fluid collection, hemorrhage, abscess, adenopathy, inguinal abnormality, or hernia.  Minor facet arthropathy in the lower lumbar spine.  No acute osseous finding.  IMPRESSION: No acute obstructing urinary tract or ureteral calculus.  Negative for hydronephrosis.  No acute intra-abdominal or pelvic process by noncontrast CT.   Original Report Authenticated By: Jerilynn Mages. Annamaria Boots, M.D.    MDM   1. Back pain    This 42 year old male who presents with left-sided back pain. He is nontoxic-appearing on exam.  He is afebrile and his vital signs are within normal limits. Exam  is notable for tenderness to palpation over the left flank. Basic labwork was obtained and is reassuring. CT scan without contrast was obtained to rule out acute kidney stones. This is negative. Patient was reassured. Patient will be discharged home. I feel source of his pain is likely musculoskeletal given the reproductive measure on exam. Patient will add ibuprofen to his pain medication regimen. He will followup with his pain Dr.  After history, exam, and medical workup I feel the patient has been appropriately medically screened and is safe for discharge home. Pertinent diagnoses were discussed with the patient. Patient was given return precautions.    Merryl Hacker, MD 07/03/13 [phone]

## 2013-07-05 ENCOUNTER — Telehealth: Payer: Self-pay | Admitting: General Practice

## 2013-07-28 NOTE — Telephone Encounter (Signed)
Pt will call back to set up appt 

## 2013-07-30 ENCOUNTER — Encounter (HOSPITAL_COMMUNITY): Payer: Self-pay | Admitting: Emergency Medicine

## 2013-07-30 ENCOUNTER — Emergency Department (HOSPITAL_COMMUNITY)
Admission: EM | Admit: 2013-07-30 | Discharge: 2013-07-31 | Disposition: A | Discharge status: Home / Self Care | Payer: Self-pay | Attending: Emergency Medicine | Admitting: Emergency Medicine

## 2013-07-30 ENCOUNTER — Emergency Department (HOSPITAL_COMMUNITY): Payer: Self-pay

## 2013-07-30 DIAGNOSIS — Z87442 Personal history of urinary calculi: Secondary | ICD-10-CM | POA: Insufficient documentation

## 2013-07-30 DIAGNOSIS — Z8739 Personal history of other diseases of the musculoskeletal system and connective tissue: Secondary | ICD-10-CM | POA: Insufficient documentation

## 2013-07-30 DIAGNOSIS — W010XXA Fall on same level from slipping, tripping and stumbling without subsequent striking against object, initial encounter: Secondary | ICD-10-CM | POA: Insufficient documentation

## 2013-07-30 DIAGNOSIS — X500XXA Overexertion from strenuous movement or load, initial encounter: Secondary | ICD-10-CM | POA: Insufficient documentation

## 2013-07-30 DIAGNOSIS — Y9289 Other specified places as the place of occurrence of the external cause: Secondary | ICD-10-CM | POA: Insufficient documentation

## 2013-07-30 DIAGNOSIS — Z91040 Latex allergy status: Secondary | ICD-10-CM | POA: Insufficient documentation

## 2013-07-30 DIAGNOSIS — Z79899 Other long term (current) drug therapy: Secondary | ICD-10-CM | POA: Insufficient documentation

## 2013-07-30 DIAGNOSIS — J45909 Unspecified asthma, uncomplicated: Secondary | ICD-10-CM | POA: Insufficient documentation

## 2013-07-30 DIAGNOSIS — S93409A Sprain of unspecified ligament of unspecified ankle, initial encounter: Secondary | ICD-10-CM | POA: Insufficient documentation

## 2013-07-30 DIAGNOSIS — Y9301 Activity, walking, marching and hiking: Secondary | ICD-10-CM | POA: Insufficient documentation

## 2013-07-30 HISTORY — DX: Radiculopathy, cervical region: M54.12

## 2013-07-30 HISTORY — DX: Unspecified asthma, uncomplicated: J45.909

## 2013-07-30 NOTE — ED Notes (Signed)
Pt. missed his step and injured his left ankle this evening with swelling.

## 2013-07-31 MED ORDER — HYDROCODONE-ACETAMINOPHEN 5-325 MG PO TABS: 1.0000 | ORAL_TABLET | Freq: Four times a day (QID) | ORAL | Status: DC | PRN

## 2013-07-31 NOTE — ED Provider Notes (Signed)
CSN: [id number]     Arrival date & time 07/30/13  2140 History   First MD Initiated Contact with Patient 07/30/13 2355     Chief Complaint  Patient presents with  . Ankle Pain   (Consider location/radiation/quality/duration/timing/severity/associated sxs/prior Treatment) HPI Comments: Stepped off curb twisting L ankle   Took a Vicodin PTA  Patient is a 42 y.o. male presenting with ankle pain. The history is provided by the patient.  Ankle Pain Location:  Ankle Injury: yes   Mechanism of injury: fall   Fall:    Fall occurred:  Walking   Point of impact:  Feet   Entrapped after fall: no   Ankle location:  L ankle Pain details:    Quality:  Aching and throbbing   Radiates to:  Does not radiate   Severity:  Mild   Onset quality:  Sudden   Timing:  Constant   Progression:  Unchanged Chronicity:  New Dislocation: no   Foreign body present:  No foreign bodies Associated symptoms: no fever     Past Medical History  Diagnosis Date  . Asthma   . Kidney stones   . Cervical radiculopathy   . Reactive airway disease    Past Surgical History  Procedure Laterality Date  . Shoulder surgery     No family history on file. History  Substance Use Topics  . Smoking status: Passive Smoke Exposure - Never Smoker  . Smokeless tobacco: Not on file  . Alcohol Use: Yes    Review of Systems  Constitutional: Negative for fever.  Musculoskeletal: Positive for joint swelling and arthralgias.  Neurological: Negative for weakness and numbness.  All other systems reviewed and are negative.    Allergies  Bee pollen; Shrimp; Vancomycin; Wheat bran; and Latex  Home Medications   Current Outpatient Rx  Name  Route  Sig  Dispense  Refill  . HYDROcodone-acetaminophen (NORCO/VICODIN) 5-325 MG per tablet   Oral   Take 1 tablet by mouth every 6 (six) hours as needed for pain.         Marland Kitchen ibuprofen (ADVIL,MOTRIN) 200 MG tablet   Oral   Take 800 mg by mouth every 6 (six) hours as needed  for pain.         . Ipratropium-Albuterol (COMBIVENT RESPIMAT IN)   Inhalation   Inhale 1 puff into the lungs once.         . loratadine (CLARITIN) 10 MG tablet   Oral   Take 10 mg by mouth daily as needed for allergies.         Marland Kitchen oxyCODONE-acetaminophen (PERCOCET/ROXICET) 5-325 MG per tablet   Oral   Take 1 tablet by mouth every 4 (four) hours as needed for pain.         Marland Kitchen tiZANidine (ZANAFLEX) 4 MG tablet   Oral   Take 4 mg by mouth every 6 (six) hours as needed (muscle spasms).         Marland Kitchen HYDROcodone-acetaminophen (NORCO/VICODIN) 5-325 MG per tablet   Oral   Take 1 tablet by mouth every 6 (six) hours as needed for pain.   10 tablet   0    BP 153/87  Pulse 83  Temp(Src) 98.4 F (36.9 C) (Oral)  Resp 20  SpO2 100% Physical Exam  Nursing note and vitals reviewed. Constitutional: He is oriented to person, place, and time. He appears well-developed and well-nourished.  HENT:  Head: Normocephalic.  Eyes: Pupils are equal, round, and reactive to light.  Neck: Normal  range of motion.  Cardiovascular: Normal rate.   Pulmonary/Chest: Effort normal.  Musculoskeletal:       Right shoulder: He exhibits tenderness and swelling. He exhibits normal range of motion.       Left ankle: He exhibits decreased range of motion and swelling. Tenderness.  Neurological: He is alert and oriented to person, place, and time.  Skin: Skin is warm. No erythema.    ED Course  Procedures (including critical care time) Labs Review Labs Reviewed - No data to display Imaging Review Dg Ankle Complete Left  07/30/2013   *RADIOLOGY REPORT*  Clinical Data: Left ankle injury and pain.  LEFT ANKLE COMPLETE - 3+ VIEW  Comparison: None  Findings: There is no evidence of fracture, subluxation or dislocation. The ankle mortise is intact. A small calcaneal spur is present. No other significant abnormalities noted.  IMPRESSION: No evidence of acute bony abnormality.   Original Report Authenticated  By: Margarette Canada, M.D.    MDM   1. Ankle sprain and strain, left, initial encounter    Placed in [facility]O given crutches and Vicodin for short period of time with orhto FU    Garald Balding, NP 07/31/13 [phone]

## 2013-08-03 NOTE — ED Provider Notes (Signed)
Medical screening examination/treatment/procedure(s) were performed by non-physician practitioner and as supervising physician I was immediately available for consultation/collaboration.  Mariea Clonts, MD 08/03/13 [phone]

## 2013-09-22 ENCOUNTER — Ambulatory Visit: Payer: Self-pay | Admitting: Internal Medicine

## 2013-10-11 ENCOUNTER — Ambulatory Visit: Payer: Self-pay | Admitting: Internal Medicine

## 2013-11-23 ENCOUNTER — Encounter (HOSPITAL_COMMUNITY): Payer: Self-pay | Admitting: Emergency Medicine

## 2013-11-23 ENCOUNTER — Emergency Department (HOSPITAL_COMMUNITY)
Admission: EM | Admit: 2013-11-23 | Discharge: 2013-11-23 | Disposition: A | Discharge status: Home / Self Care | Payer: Self-pay | Attending: Emergency Medicine | Admitting: Emergency Medicine

## 2013-11-23 DIAGNOSIS — K089 Disorder of teeth and supporting structures, unspecified: Secondary | ICD-10-CM | POA: Insufficient documentation

## 2013-11-23 DIAGNOSIS — K006 Disturbances in tooth eruption: Secondary | ICD-10-CM | POA: Insufficient documentation

## 2013-11-23 DIAGNOSIS — J45909 Unspecified asthma, uncomplicated: Secondary | ICD-10-CM | POA: Insufficient documentation

## 2013-11-23 DIAGNOSIS — Z79899 Other long term (current) drug therapy: Secondary | ICD-10-CM | POA: Insufficient documentation

## 2013-11-23 DIAGNOSIS — Z8739 Personal history of other diseases of the musculoskeletal system and connective tissue: Secondary | ICD-10-CM | POA: Insufficient documentation

## 2013-11-23 DIAGNOSIS — H9209 Otalgia, unspecified ear: Secondary | ICD-10-CM | POA: Insufficient documentation

## 2013-11-23 DIAGNOSIS — K0889 Other specified disorders of teeth and supporting structures: Secondary | ICD-10-CM

## 2013-11-23 DIAGNOSIS — Z87442 Personal history of urinary calculi: Secondary | ICD-10-CM | POA: Insufficient documentation

## 2013-11-23 DIAGNOSIS — K1370 Unspecified lesions of oral mucosa: Secondary | ICD-10-CM | POA: Insufficient documentation

## 2013-11-23 DIAGNOSIS — R509 Fever, unspecified: Secondary | ICD-10-CM | POA: Insufficient documentation

## 2013-11-23 DIAGNOSIS — R51 Headache: Secondary | ICD-10-CM | POA: Insufficient documentation

## 2013-11-23 DIAGNOSIS — Z91040 Latex allergy status: Secondary | ICD-10-CM | POA: Insufficient documentation

## 2013-11-23 MED ORDER — IBUPROFEN 800 MG PO TABS: 800.0000 mg | ORAL_TABLET | Freq: Three times a day (TID) | ORAL | Status: DC

## 2013-11-23 MED ORDER — PENICILLIN V POTASSIUM 250 MG PO TABS: 500.0000 mg | ORAL_TABLET | Freq: Once | ORAL | Status: DC

## 2013-11-23 MED ORDER — PENICILLIN V POTASSIUM 500 MG PO TABS: 500.0000 mg | ORAL_TABLET | Freq: Four times a day (QID) | ORAL | Status: DC

## 2013-11-23 NOTE — ED Notes (Signed)
Pt out at nurses station yelling at staff, calling staff names. Security called. Pt states that he does not want antibiotics or his discharge papers. Charge/AD made aware as well.

## 2013-11-23 NOTE — ED Provider Notes (Signed)
CSN: [id number]     Arrival date & time 11/23/13  1740 History   First MD Initiated Contact with Patient 11/23/13 1845     This chart was scribed for non-physician practitioner, Jeannett Senior, PA-C, working with Juanda Crumble B. Karle Starch, MD by Forrestine Him, ED Scribe. This patient was seen in room TR10C/TR10C and the patient's care was started at 7:39 PM.  Chief Complaint  Patient presents with  . Dental Pain   HPI  HPI Comments: Bradley Colon is a 43 y.o. male who presents to the Emergency Department complaining of dental pain to the right front tooth and gums with associated swelling that initially started a few days ago. He also reports diffuse pain to his face, and bilateral otalgia. He states he had a fever a few days ago up to 101 that has now resolved. He says eating and palpation exacerbates his pain causing shooting pain into his head. Denies any alleviating factors. Pt says he contacted his dentist and was told he could not be seen until 3/5. Pt has no other complaints at this time.  Past Medical History  Diagnosis Date  . Asthma   . Kidney stones   . Cervical radiculopathy   . Reactive airway disease    Past Surgical History  Procedure Laterality Date  . Shoulder surgery     No family history on file. History  Substance Use Topics  . Smoking status: Passive Smoke Exposure - Never Smoker  . Smokeless tobacco: Not on file  . Alcohol Use: Yes    Review of Systems  Constitutional: Positive for fever and chills.  HENT: Positive for dental problem and ear pain.   Neurological: Positive for headaches.    Allergies  Bee pollen; Shrimp; Vancomycin; Wheat bran; and Latex  Home Medications   Current Outpatient Rx  Name  Route  Sig  Dispense  Refill  . diphenhydrAMINE (BENADRYL) 25 mg capsule   Oral   Take 25 mg by mouth daily as needed for allergies.         Marland Kitchen ibuprofen (ADVIL,MOTRIN) 200 MG tablet   Oral   Take 800 mg by mouth every 6 (six) hours as needed for  pain.         Marland Kitchen loratadine (CLARITIN) 10 MG tablet   Oral   Take 10 mg by mouth daily as needed for allergies.         . Multiple Vitamins-Minerals (MULTIVITAMIN PO)   Oral   Take 1 tablet by mouth daily.         Marland Kitchen oxyCODONE-acetaminophen (PERCOCET) 10-325 MG per tablet   Oral   Take 1 tablet by mouth every 4 (four) hours as needed for pain.         Marland Kitchen tiZANidine (ZANAFLEX) 4 MG tablet   Oral   Take 4 mg by mouth every 6 (six) hours as needed (muscle spasms).          Triage Vitals: BP 141/92  Pulse 80  Temp(Src) 98.4 F (36.9 C) (Oral)  Resp 18  Ht 5\' 8"  (1.727 m)  Wt 195 lb (88.451 kg)  BMI 29.66 kg/m2  SpO2 98%  Physical Exam  Nursing note and vitals reviewed. Constitutional: He is oriented to person, place, and time. He appears well-developed and well-nourished.  HENT:  Head: Normocephalic and atraumatic.  Poor dentition. Most teeth including upper central incisors are loose. Gums appear normal. Tender to palpation over upper gums.   Eyes: EOM are normal.  Neck: Normal range of  motion.  Cardiovascular: Normal rate and regular rhythm.   Pulmonary/Chest: Effort normal.  Musculoskeletal: Normal range of motion.  Neurological: He is alert and oriented to person, place, and time.  Skin: Skin is warm and dry.  Psychiatric: He has a normal mood and affect. His behavior is normal.    ED Course  Procedures (including critical care time)  DIAGNOSTIC STUDIES: Oxygen Saturation is 98% on RA, Normal by my interpretation.    COORDINATION OF CARE: 7:43 PM- Will give antibiotic and pain medication. Discussed treatment plan with pt at bedside and pt agreed to plan.     Labs Review Labs Reviewed - No data to display Imaging Review No results found.  EKG Interpretation   None       MDM  No diagnosis found. PT with a dental pain and "apparent infection." When I examined pt, he became angry because I pressed on a painful tooth. He would not let me examine  him any further. There was no erythema over the gum, his teeth did appear to be loose, most of them, but he stated this is chronic. I explained to pt if I cannot examine him, i am unable to tell what is going on and what is causing his pain, from what I can tell by looking, his gum appeared normal. There is no obvious facial swelling. I was in the room with a scribe, and she has witnessed entire conversation. Pt was angry, yelling. He states he knows he has an infection, and his dentist wont see him until March 5th. i explained we can give him a referral to a different one, but he stated he did not want to see another one.  I asked what I can do for the pt. I asked if he needed antibiotics. He stated yes. I ordered pt a dose of penicillin here by his request and d/c home with penicillin. Pt then requested pain medications. Stated his tooth pain is severe. He also mentioned chronic pain, stating he has high tolerance for pain pills. I have explained to him that i will treat his pain with ibuprofen, because I am unable to examine him any further, i do not want mask any symptoms and he will need to come back if worsening. Pt became very angry after he was denied narcotic pain medications, he was again yelling at me and calling me "dummy." pt asked to speak to a supervisor. Charge nurse called. PT walked out before receiving his prescriptions or speaking with charge nurse. He was escorted by security due to his behavior and yelling in the hallway and calling me names. He also stated to [place] as walking out that he doesn't want drugs afterall because he just got 60 tabs of percocet 10s and he showed [place] a picture of his prescription on his phone. He walked out yelling saying he will sue everyone here including myself for touching his tooth and not treating him appropriately. Pt is afebrile, no signs of any other problems.   Filed Vitals:   11/23/13 1759  BP: 141/92  Pulse: 80  Temp: 98.4 F (36.9 C)  TempSrc: Oral   Resp: 18  Height: 5\' 8"  (1.727 m)  Weight: 195 lb (88.451 kg)  SpO2: 98%    I personally performed the services described in this documentation, which was scribed in my presence. The recorded information has been reviewed and is accurate.   Renold Genta, PA-C 11/23/13 2003

## 2013-11-23 NOTE — ED Notes (Signed)
Pt reports pain and swelling to R front tooth/gums.

## 2013-11-23 NOTE — Discharge Instructions (Signed)
Ibuprofen for pain. Penicillin for infection.

## 2013-11-23 NOTE — ED Provider Notes (Signed)
Medical screening examination/treatment/procedure(s) were performed by non-physician practitioner and as supervising physician I was immediately available for consultation/collaboration.  EKG Interpretation   None         Samik Balkcom B. Hassani Sliney, MD 11/23/13 2052 

## 2013-11-26 ENCOUNTER — Encounter (HOSPITAL_COMMUNITY): Payer: Self-pay | Admitting: Emergency Medicine

## 2013-11-26 ENCOUNTER — Emergency Department (HOSPITAL_COMMUNITY)
Admission: EM | Admit: 2013-11-26 | Discharge: 2013-11-26 | Disposition: A | Discharge status: Home / Self Care | Payer: Self-pay | Attending: Emergency Medicine | Admitting: Emergency Medicine

## 2013-11-26 DIAGNOSIS — Z87442 Personal history of urinary calculi: Secondary | ICD-10-CM | POA: Insufficient documentation

## 2013-11-26 DIAGNOSIS — R599 Enlarged lymph nodes, unspecified: Secondary | ICD-10-CM | POA: Insufficient documentation

## 2013-11-26 DIAGNOSIS — Z91040 Latex allergy status: Secondary | ICD-10-CM | POA: Insufficient documentation

## 2013-11-26 DIAGNOSIS — K002 Abnormalities of size and form of teeth: Secondary | ICD-10-CM | POA: Insufficient documentation

## 2013-11-26 DIAGNOSIS — K0889 Other specified disorders of teeth and supporting structures: Secondary | ICD-10-CM

## 2013-11-26 DIAGNOSIS — J45909 Unspecified asthma, uncomplicated: Secondary | ICD-10-CM | POA: Insufficient documentation

## 2013-11-26 DIAGNOSIS — Z791 Long term (current) use of non-steroidal anti-inflammatories (NSAID): Secondary | ICD-10-CM | POA: Insufficient documentation

## 2013-11-26 DIAGNOSIS — K089 Disorder of teeth and supporting structures, unspecified: Secondary | ICD-10-CM | POA: Insufficient documentation

## 2013-11-26 DIAGNOSIS — H9209 Otalgia, unspecified ear: Secondary | ICD-10-CM | POA: Insufficient documentation

## 2013-11-26 DIAGNOSIS — Z8739 Personal history of other diseases of the musculoskeletal system and connective tissue: Secondary | ICD-10-CM | POA: Insufficient documentation

## 2013-11-26 MED ORDER — AMOXICILLIN 500 MG PO CAPS: 500.0000 mg | ORAL_CAPSULE | Freq: Three times a day (TID) | ORAL | Status: DC

## 2013-11-26 MED ORDER — AMOXICILLIN 500 MG PO CAPS
500.0000 mg | ORAL_CAPSULE | Freq: Once | ORAL | Status: AC
  Administered 2013-11-26: 500 mg via ORAL
  Filled 2013-11-26: qty 1

## 2013-11-26 NOTE — Discharge Instructions (Signed)
Please continue to take ibuprofen for pain. Take amoxicillin for infection until all gone. Follow up with your dentist.    Dental Pain A tooth ache may be caused by cavities (tooth decay). Cavities expose the nerve of the tooth to air and hot or cold temperatures. It may come from an infection or abscess (also called a boil or furuncle) around your tooth. It is also often caused by dental caries (tooth decay). This causes the pain you are having. DIAGNOSIS  Your caregiver can diagnose this problem by exam. TREATMENT   If caused by an infection, it may be treated with medications which kill germs (antibiotics) and pain medications as prescribed by your caregiver. Take medications as directed.  Only take over-the-counter or prescription medicines for pain, discomfort, or fever as directed by your caregiver.  Whether the tooth ache today is caused by infection or dental disease, you should see your dentist as soon as possible for further care. SEEK MEDICAL CARE IF: The exam and treatment you received today has been provided on an emergency basis only. This is not a substitute for complete medical or dental care. If your problem worsens or new problems (symptoms) appear, and you are unable to meet with your dentist, call or return to this location. SEEK IMMEDIATE MEDICAL CARE IF:   You have a fever.  You develop redness and swelling of your face, jaw, or neck.  You are unable to open your mouth.  You have severe pain uncontrolled by pain medicine. MAKE SURE YOU:   Understand these instructions.  Will watch your condition.  Will get help right away if you are not doing well or get worse. Document Released: 10/20/2005 Document Revised: 01/12/2012 Document Reviewed: 06/07/2008 [facility] Patient Information 2014 [place].

## 2013-11-26 NOTE — ED Notes (Signed)
Pt states that he went to [place] for dental pain and was called a drug seeker.  States that he got angry and left.  C/o dental pain radiating to jaw and ears.

## 2013-11-26 NOTE — ED Provider Notes (Signed)
Medical screening examination/treatment/procedure(s) were performed by non-physician practitioner and as supervising physician I was immediately available for consultation/collaboration.  EKG Interpretation   None         Houston Siren, MD 11/26/13 1001

## 2013-11-26 NOTE — ED Provider Notes (Signed)
CSN: [id number]     Arrival date & time 11/26/13  5809 History   First MD Initiated Contact with Patient 11/26/13 [phone]     Chief Complaint  Patient presents with  . Dental Pain   (Consider location/radiation/quality/duration/timing/severity/associated sxs/prior Treatment) HPI Bradley Colon is a 43 y.o. male who presents to emergency department with dental pain and swollen lymph nodes. Pt states his pain began in week ago. Pain mainly in the right front tooth. States pain radiates around his face and down to the right submandibular area. Patient states he feels like his lymph nodes are swollen. Patient also reports pain radiating into the right ear. He denies fever, chills, malaise. No pain with swallowing. No upper respiratory symptoms. He was actually seen at [facility] emergency department by me 3 days ago, at which time he became angry because I did not give him any narcotic pain medications. Patient did not take his prescription for amoxicillin at that time. Patient also told me at that time he had an appointment with his dentist on March 5, however today he states that he moved his appointment to Monday. Patient denies any facial swelling. States pain worsened with biting down an opening his mouth. States pain is improved by taking his Percocet. No other complaints.   Past Medical History  Diagnosis Date  . Asthma   . Kidney stones   . Cervical radiculopathy   . Reactive airway disease    Past Surgical History  Procedure Laterality Date  . Shoulder surgery     No family history on file. History  Substance Use Topics  . Smoking status: Passive Smoke Exposure - Never Smoker  . Smokeless tobacco: Not on file  . Alcohol Use: Yes    Review of Systems  Constitutional: Negative for fever and chills.  HENT: Positive for dental problem and ear pain. Negative for facial swelling and sore throat.   Genitourinary: Negative for dysuria, urgency, frequency and hematuria.  Musculoskeletal:  Negative for arthralgias, myalgias, neck pain and neck stiffness.  Skin: Negative for rash.  Allergic/Immunologic: Negative for immunocompromised state.  Neurological: Negative for light-headedness and headaches.    Allergies  Bee pollen; Shrimp; Vancomycin; Wheat bran; and Latex  Home Medications   Current Outpatient Rx  Name  Route  Sig  Dispense  Refill  . ibuprofen (ADVIL,MOTRIN) 200 MG tablet   Oral   Take 800 mg by mouth every 6 (six) hours as needed for pain.         Marland Kitchen ibuprofen (ADVIL,MOTRIN) 800 MG tablet   Oral   Take 1 tablet (800 mg total) by mouth 3 (three) times daily.   21 tablet   0   . loratadine (CLARITIN) 10 MG tablet   Oral   Take 10 mg by mouth daily as needed for allergies.         . Multiple Vitamins-Minerals (MULTIVITAMIN PO)   Oral   Take 1 tablet by mouth daily.         Marland Kitchen oxyCODONE-acetaminophen (PERCOCET) 10-325 MG per tablet   Oral   Take 1 tablet by mouth every 4 (four) hours as needed for pain.         Marland Kitchen penicillin v potassium (VEETID) 500 MG tablet   Oral   Take 1 tablet (500 mg total) by mouth 4 (four) times daily.   40 tablet   0   . tiZANidine (ZANAFLEX) 4 MG tablet   Oral   Take 4 mg by mouth every 6 (six)  hours as needed (muscle spasms).          BP 165/103  Pulse 68  Temp(Src) 97.6 F (36.4 C) (Oral)  Resp 20  SpO2 99% Physical Exam  Constitutional: He is oriented to person, place, and time. No distress.  HENT:  Head: Normocephalic.  Right Ear: External ear normal.  Left Ear: External ear normal.  Nose: Nose normal.  Mouth/Throat: Oropharynx is clear and moist. No oropharyngeal exudate.  Poor dentition. Lose right front central incisor. No surrounding gum swelling or erythema. No palpable submandibular swollen lymphadenopathy. No cervical lymphadenopathy. TMs normal bilaterally  Eyes: Conjunctivae are normal.  Neck: Neck supple.  Cardiovascular: Normal rate, regular rhythm and normal heart sounds.    Pulmonary/Chest: Effort normal and breath sounds normal. No respiratory distress. He has no wheezes. He has no rales.  Neurological: He is alert and oriented to person, place, and time.  Skin: Skin is warm and dry.    ED Course  Procedures (including critical care time) Labs Review Labs Reviewed - No data to display Imaging Review No results found.  EKG Interpretation   None       MDM   1. Pain, dental    Pt with persistent dental pain. No signs of infection based on exam, pt requesting antibiotics. Given dose of amoxil in ed and d/c home with amoxil. Pt has pain medications at home according to him. Also has an apt for Monday with a dentist.   I was accompanied by [facility] officer the whole time while in the room with a pt due to his prior behavior.   Filed Vitals:   11/26/13 0706  BP: 165/103  Pulse: 68  Temp: 97.6 F (36.4 C)  TempSrc: Oral  Resp: 20  SpO2: 99%         Bradley Genta, PA-C 11/26/13 0828  Bradley Route Lazaro Isenhower, PA-C 11/26/13 1000

## 2014-01-08 ENCOUNTER — Emergency Department (HOSPITAL_COMMUNITY): Payer: Self-pay

## 2014-01-08 ENCOUNTER — Emergency Department (HOSPITAL_COMMUNITY)
Admission: EM | Admit: 2014-01-08 | Discharge: 2014-01-09 | Disposition: A | Discharge status: Home / Self Care | Payer: Self-pay | Attending: Emergency Medicine | Admitting: Emergency Medicine

## 2014-01-08 ENCOUNTER — Encounter (HOSPITAL_COMMUNITY): Payer: Self-pay | Admitting: Emergency Medicine

## 2014-01-08 DIAGNOSIS — M542 Cervicalgia: Secondary | ICD-10-CM | POA: Insufficient documentation

## 2014-01-08 DIAGNOSIS — Z87442 Personal history of urinary calculi: Secondary | ICD-10-CM | POA: Insufficient documentation

## 2014-01-08 DIAGNOSIS — J45909 Unspecified asthma, uncomplicated: Secondary | ICD-10-CM | POA: Insufficient documentation

## 2014-01-08 DIAGNOSIS — Z8781 Personal history of (healed) traumatic fracture: Secondary | ICD-10-CM | POA: Insufficient documentation

## 2014-01-08 DIAGNOSIS — I498 Other specified cardiac arrhythmias: Secondary | ICD-10-CM | POA: Insufficient documentation

## 2014-01-08 DIAGNOSIS — R071 Chest pain on breathing: Secondary | ICD-10-CM | POA: Insufficient documentation

## 2014-01-08 DIAGNOSIS — R209 Unspecified disturbances of skin sensation: Secondary | ICD-10-CM | POA: Insufficient documentation

## 2014-01-08 DIAGNOSIS — R0789 Other chest pain: Secondary | ICD-10-CM

## 2014-01-08 MED ORDER — KETOROLAC TROMETHAMINE 60 MG/2ML IM SOLN
60.0000 mg | Freq: Once | INTRAMUSCULAR | Status: AC
  Administered 2014-01-08: 60 mg via INTRAMUSCULAR
  Filled 2014-01-08: qty 2

## 2014-01-08 MED ORDER — ALBUTEROL SULFATE HFA 108 (90 BASE) MCG/ACT IN AERS: 1.0000 | INHALATION_SPRAY | Freq: Four times a day (QID) | RESPIRATORY_TRACT | Status: DC | PRN

## 2014-01-08 MED ORDER — INDOMETHACIN 25 MG PO CAPS: 25.0000 mg | ORAL_CAPSULE | Freq: Three times a day (TID) | ORAL | Status: DC | PRN

## 2014-01-08 NOTE — Discharge Instructions (Signed)
Chest Wall Pain Chest wall pain is pain felt in or around the chest bones and muscles. It may take up to 6 weeks to get better. It may take longer if you are active. Chest wall pain can happen on its own. Other times, things like germs, injury, coughing, or exercise can cause the pain. HOME CARE   Avoid activities that make you tired or cause pain. Try not to use your chest, belly (abdominal), or side muscles. Do not use heavy weights.  Put ice on the sore area.  Put ice in a plastic bag.  Place a towel between your skin and the bag.  Leave the ice on for 15-20 minutes for the first 2 days.  Only take medicine as told by your doctor. GET HELP RIGHT AWAY IF:   You have more pain or are very uncomfortable.  You have a fever.  Your chest pain gets worse.  You have new problems.  You feel sick to your stomach (nauseous) or throw up (vomit).  You start to sweat or feel lightheaded.  You have a cough with mucus (phlegm).  You cough up blood. MAKE SURE YOU:   Understand these instructions.  Will watch your condition.  Will get help right away if you are not doing well or get worse. Document Released: 04/07/2008 Document Revised: 01/12/2012 Document Reviewed: 06/16/2011 [facility] Patient Information 2014 [place], [place].

## 2014-01-08 NOTE — ED Provider Notes (Signed)
CSN: [id number]     Arrival date & time 01/08/14  1847 History   First MD Initiated Contact with Patient 01/08/14 2055     Chief Complaint  Patient presents with  . Back Pain     (Consider location/radiation/quality/duration/timing/severity/associated sxs/prior Treatment) Patient is a 43 y.o. male presenting with back pain and chest pain.  Back Pain Pain location: lower cervical spine, upper thoracic spine. Quality:  Burning Pain severity:  Moderate Duration:  3 days Timing:  Intermittent Progression:  Waxing and waning Chronicity:  Recurrent Context: twisting   Ineffective treatments:  Muscle relaxants and narcotics Associated symptoms: chest pain   Associated symptoms: no bladder incontinence, no bowel incontinence, no fever, no paresthesias, no perianal numbness and no weakness   Chest Pain Pain location:  L chest and R chest Pain quality: stabbing   Pain radiates to:  Does not radiate Pain radiates to the back: no   Pain severity:  Moderate Onset quality:  Sudden Duration:  3 days Timing:  Intermittent Progression:  Waxing and waning Chronicity:  New Context: breathing and raising an arm   Worsened by:  Coughing, deep breathing, exertion and movement Associated symptoms: back pain   Associated symptoms: no fever, no shortness of breath and no weakness    Patient with history of degenerative cervical disk disease, reports receiving epidural injection at orthopedist in [place] about one week ago.  Patient reporting tenderness at base of neck, increasing pain with right lateral rotation.  "Burning" sensation in both arms.  Chest wall pain, worse with inspiration and movement.  Remote history of rib fractures. Past Medical History  Diagnosis Date  . Asthma   . Kidney stones   . Cervical radiculopathy   . Reactive airway disease    Past Surgical History  Procedure Laterality Date  . Shoulder surgery     No family history on file. History  Substance Use Topics  . Smoking  status: Passive Smoke Exposure - Never Smoker  . Smokeless tobacco: Not on file  . Alcohol Use: Yes    Review of Systems  Constitutional: Negative for fever.  Respiratory: Negative for shortness of breath.   Cardiovascular: Positive for chest pain.  Gastrointestinal: Negative for bowel incontinence.  Genitourinary: Negative for bladder incontinence.  Musculoskeletal: Positive for back pain.  Neurological: Negative for weakness and paresthesias.  All other systems reviewed and are negative.      Allergies  Bee pollen; Shrimp; Vancomycin; Wheat bran; and Latex  Home Medications   Current Outpatient Rx  Name  Route  Sig  Dispense  Refill  . ibuprofen (ADVIL,MOTRIN) 200 MG tablet   Oral   Take 800 mg by mouth every 6 (six) hours as needed for pain.         . Multiple Vitamin (MULTIVITAMIN WITH MINERALS) TABS tablet   Oral   Take 1 tablet by mouth daily.         Marland Kitchen oxyCODONE-acetaminophen (PERCOCET) 10-325 MG per tablet   Oral   Take 1 tablet by mouth every 4 (four) hours as needed for pain.         Marland Kitchen tiZANidine (ZANAFLEX) 4 MG tablet   Oral   Take 4 mg by mouth every 6 (six) hours as needed (muscle spasms).          BP 136/61  Pulse 53  Temp(Src) 98 F (36.7 C) (Oral)  Resp 18  Ht 5\' 8"  (1.727 m)  Wt 200 lb (90.719 kg)  BMI 30.42 kg/m2  SpO2  98% Physical Exam  Nursing note and vitals reviewed. Constitutional: He is oriented to person, place, and time. He appears well-developed and well-nourished.  HENT:  Head: Normocephalic and atraumatic.  Eyes: Pupils are equal, round, and reactive to light.  Neck: Normal range of motion.    Cardiovascular: Regular rhythm and normal heart sounds.   No extrasystoles are present. Bradycardia present.   Pulmonary/Chest: Effort normal and breath sounds normal.  Abdominal: Soft. Bowel sounds are normal.  Musculoskeletal: He exhibits no edema and no tenderness.  Neurological: He is alert and oriented to person, place,  and time. No cranial nerve deficit. Coordination normal.  Skin: Skin is warm and dry.  Psychiatric: He has a normal mood and affect. His behavior is normal. Judgment and thought content normal.    ED Course  Procedures (including critical care time) Labs Review Labs Reviewed  URINALYSIS, ROUTINE W REFLEX MICROSCOPIC   Imaging Review No results found.   EKG Interpretation None      Date: 01/08/2014  Rate: 53  Rhythm: sinus bradycardia  QRS Axis: normal  Intervals: normal  ST/T Wave abnormalities: normal  Conduction Disutrbances:none  Narrative Interpretation: Sinus bradycardia  Old EKG Reviewed: none available  Patient primary complaint today is chest wall pain that is worsened with coughing, sneezing, deep inspiration, onset three days ago. Remote history of rib fractures, and this pain is similar.  Also indicates he received an epidural injection from an orthopedist in [place], and is now having pain at the base of his neck that worsens when rotating head to the right.  Neuro exam reassuring, no deficits noted.  Point tenderness at base of neck without overt edema or swelling.  Doubt abscess or hematoma. EKG without indication of ischemia.  No ACS risk factors.  Radiology results reviewed and shared with patient.   Discharged home with indocin for chest wall pain.  Return precautions discussed. MDM   Final diagnoses:  None    Chest wall pain. Chronic neck pain.    Norman Herrlich, NP 01/09/14 [phone]

## 2014-01-08 NOTE — ED Notes (Addendum)
Pt. Reports had epidural on feb 25th due to herniated disc and was laying around after. Pt went to stretch and felt pop in middle of back and urinated some on himself. Now experiencing pain in armpits back and entire torso. When pt turns head to right he has pain radiating to spine. Pt taking percocet and muscle relaxers at home (last dose 2pm) with relief for a short time then pain comes back.

## 2014-01-09 LAB — URINALYSIS, ROUTINE W REFLEX MICROSCOPIC
BILIRUBIN URINE: NEGATIVE
GLUCOSE, UA: NEGATIVE mg/dL
Hgb urine dipstick: NEGATIVE
Ketones, ur: NEGATIVE mg/dL
Leukocytes, UA: NEGATIVE
Nitrite: NEGATIVE
PH: 6.5 (ref 5.0–8.0)
Protein, ur: NEGATIVE mg/dL
SPECIFIC GRAVITY, URINE: 1.019 (ref 1.005–1.030)
Urobilinogen, UA: 0.2 mg/dL (ref 0.0–1.0)

## 2014-01-09 NOTE — ED Provider Notes (Signed)
Medical screening examination/treatment/procedure(s) were performed by non-physician practitioner and as supervising physician I was immediately available for consultation/collaboration.   EKG Interpretation   Date/Time:  Sunday January 08 2014 20:32:48 EDT Ventricular Rate:  53 PR Interval:  200 QRS Duration: 102 QT Interval:  408 QTC Calculation: 382 R Axis:   85 Text Interpretation:  Sinus bradycardia Otherwise normal ECG No  significant change since last tracing Confirmed by Kenzlie Disch,  DO, Yadier Bramhall  ([id number]) on 01/08/2014 10:22:24 PM        [place], DO 01/09/14 2302

## 2014-03-22 ENCOUNTER — Encounter (HOSPITAL_COMMUNITY): Payer: Self-pay | Admitting: Emergency Medicine

## 2014-03-22 DIAGNOSIS — R079 Chest pain, unspecified: Secondary | ICD-10-CM | POA: Insufficient documentation

## 2014-03-22 DIAGNOSIS — J45901 Unspecified asthma with (acute) exacerbation: Secondary | ICD-10-CM | POA: Insufficient documentation

## 2014-03-22 DIAGNOSIS — F17200 Nicotine dependence, unspecified, uncomplicated: Secondary | ICD-10-CM | POA: Insufficient documentation

## 2014-03-22 LAB — BASIC METABOLIC PANEL
BUN: 23 mg/dL (ref 6–23)
CO2: 23 meq/L (ref 19–32)
CREATININE: 1.06 mg/dL (ref 0.50–1.35)
Calcium: 9.4 mg/dL (ref 8.4–10.5)
Chloride: 107 mEq/L (ref 96–112)
GFR calc Af Amer: >90 mL/min (ref >90)
GFR calc non Af Amer: 84 mL/min — ABNORMAL LOW (ref >90)
GLUCOSE: 85 mg/dL (ref 70–99)
Potassium: 4.5 mEq/L (ref 3.7–5.3)
Sodium: 142 mEq/L (ref 137–147)

## 2014-03-22 LAB — CBC
HEMATOCRIT: 42.4 % (ref 39.0–52.0)
HEMOGLOBIN: 13.9 g/dL (ref 13.0–17.0)
MCH: 29.4 pg (ref 26.0–34.0)
MCHC: 32.8 g/dL (ref 30.0–36.0)
MCV: 89.6 fL (ref 78.0–100.0)
Platelets: 270 10*3/uL (ref 150–400)
RBC: 4.73 MIL/uL (ref 4.22–5.81)
RDW: 13.7 % (ref 11.5–15.5)
WBC: 8.1 10*3/uL (ref 4.0–10.5)

## 2014-03-22 NOTE — ED Notes (Signed)
Presents with left sided chest pain that comes and goes described as muscle spasms and "gas bubble" associated with nausea. Pt received epidural for cervical spine 2 weeks ago and reports that when he turned his head to the side, the pain began in the left neck and shot through down to left chest and also has the 2 middle fingers on left hand that twitch. He takes percocet for pain and the pain worse when he moves his head to the left. Breathing also makes pain worse.  Left breath sounds diminished. Pt unable to take deep breath without pain.

## 2014-03-23 ENCOUNTER — Encounter (HOSPITAL_COMMUNITY): Payer: Self-pay | Admitting: Emergency Medicine

## 2014-03-23 ENCOUNTER — Emergency Department (HOSPITAL_COMMUNITY)
Admission: EM | Admit: 2014-03-23 | Discharge: 2014-03-23 | Discharge status: Against Medical Advice | Payer: Self-pay | Attending: Emergency Medicine | Admitting: Emergency Medicine

## 2014-03-23 ENCOUNTER — Emergency Department (HOSPITAL_COMMUNITY)
Admission: EM | Admit: 2014-03-23 | Discharge: 2014-03-23 | Disposition: A | Discharge status: Home / Self Care | Payer: Self-pay | Attending: Emergency Medicine | Admitting: Emergency Medicine

## 2014-03-23 ENCOUNTER — Emergency Department (HOSPITAL_COMMUNITY): Payer: Self-pay

## 2014-03-23 DIAGNOSIS — F17200 Nicotine dependence, unspecified, uncomplicated: Secondary | ICD-10-CM | POA: Insufficient documentation

## 2014-03-23 DIAGNOSIS — Z87442 Personal history of urinary calculi: Secondary | ICD-10-CM | POA: Insufficient documentation

## 2014-03-23 DIAGNOSIS — M542 Cervicalgia: Secondary | ICD-10-CM | POA: Insufficient documentation

## 2014-03-23 DIAGNOSIS — M549 Dorsalgia, unspecified: Secondary | ICD-10-CM | POA: Insufficient documentation

## 2014-03-23 DIAGNOSIS — J45901 Unspecified asthma with (acute) exacerbation: Secondary | ICD-10-CM | POA: Insufficient documentation

## 2014-03-23 DIAGNOSIS — Z91040 Latex allergy status: Secondary | ICD-10-CM | POA: Insufficient documentation

## 2014-03-23 DIAGNOSIS — R0789 Other chest pain: Secondary | ICD-10-CM

## 2014-03-23 DIAGNOSIS — R071 Chest pain on breathing: Secondary | ICD-10-CM | POA: Insufficient documentation

## 2014-03-23 DIAGNOSIS — G8929 Other chronic pain: Secondary | ICD-10-CM

## 2014-03-23 DIAGNOSIS — G8921 Chronic pain due to trauma: Secondary | ICD-10-CM | POA: Insufficient documentation

## 2014-03-23 LAB — CBC
HEMATOCRIT: 43.3 % (ref 39.0–52.0)
Hemoglobin: 14.1 g/dL (ref 13.0–17.0)
MCH: 29.3 pg (ref 26.0–34.0)
MCHC: 32.6 g/dL (ref 30.0–36.0)
MCV: 89.8 fL (ref 78.0–100.0)
PLATELETS: 263 10*3/uL (ref 150–400)
RBC: 4.82 MIL/uL (ref 4.22–5.81)
RDW: 13.7 % (ref 11.5–15.5)
WBC: 7.3 10*3/uL (ref 4.0–10.5)

## 2014-03-23 LAB — I-STAT TROPONIN, ED: TROPONIN I, POC: 0 ng/mL (ref 0.00–0.08)

## 2014-03-23 LAB — BASIC METABOLIC PANEL
BUN: 22 mg/dL (ref 6–23)
CALCIUM: 9.5 mg/dL (ref 8.4–10.5)
CO2: 25 meq/L (ref 19–32)
CREATININE: 1.04 mg/dL (ref 0.50–1.35)
Chloride: 107 mEq/L (ref 96–112)
GFR calc Af Amer: >90 mL/min (ref >90)
GFR calc non Af Amer: 86 mL/min — ABNORMAL LOW (ref >90)
Glucose, Bld: 93 mg/dL (ref 70–99)
Potassium: 5 mEq/L (ref 3.7–5.3)
Sodium: 144 mEq/L (ref 137–147)

## 2014-03-23 LAB — D-DIMER, QUANTITATIVE (NOT AT ARMC)

## 2014-03-23 MED ORDER — HYDROMORPHONE HCL PF 1 MG/ML IJ SOLN
2.0000 mg | Freq: Once | INTRAMUSCULAR | Status: AC
  Administered 2014-03-23: 2 mg via INTRAMUSCULAR
  Filled 2014-03-23: qty 2

## 2014-03-23 MED ORDER — NAPROXEN 500 MG PO TABS: 500.0000 mg | ORAL_TABLET | Freq: Two times a day (BID) | ORAL | Status: DC

## 2014-03-23 MED ORDER — ONDANSETRON 4 MG PO TBDP
8.0000 mg | ORAL_TABLET | Freq: Once | ORAL | Status: AC
  Administered 2014-03-23: 8 mg via ORAL

## 2014-03-23 MED ORDER — ONDANSETRON 4 MG PO TBDP
ORAL_TABLET | ORAL | Status: AC
  Filled 2014-03-23: qty 2

## 2014-03-23 NOTE — ED Provider Notes (Signed)
CSN: [id number]     Arrival date & time 03/23/14  1022 History   First MD Initiated Contact with Patient 03/23/14 1118     Chief Complaint  Patient presents with  . Chest Pain     (Consider location/radiation/quality/duration/timing/severity/associated sxs/prior Treatment) Patient is a 43 y.o. male presenting with chest pain. The history is provided by the patient.  Chest Pain Associated symptoms: back pain and shortness of breath   Associated symptoms: no abdominal pain, no fever, no headache, no nausea and not vomiting    patient with history of chronic neck pain shoulder and back pain following on-the-job injury in [place] followed by pain management up there. Patient also under disability court proceedings. Patient with worse pain in the left chest radiating to the back as had this before but also associated with shortness of breath. Patient was afraid of that he was having a heart attack. Also had a history of pneumothorax in the past. Pain is made worse by taking a deep breath coughing moving. Pain is worse is 10 out of 10. Patient's already on Percocet and Zanaflex from pain management.  Past Medical History  Diagnosis Date  . Asthma   . Kidney stones   . Cervical radiculopathy   . Reactive airway disease    Past Surgical History  Procedure Laterality Date  . Shoulder surgery     No family history on file. History  Substance Use Topics  . Smoking status: Light Tobacco Smoker    Types: Cigars  . Smokeless tobacco: Not on file  . Alcohol Use: Yes    Review of Systems  Constitutional: Negative for fever.  HENT: Negative for congestion.   Eyes: Negative for visual disturbance.  Respiratory: Positive for shortness of breath.   Cardiovascular: Positive for chest pain.  Gastrointestinal: Negative for nausea, vomiting and abdominal pain.  Genitourinary: Negative for dysuria.  Musculoskeletal: Positive for back pain and neck pain.  Skin: Negative for rash.   Neurological: Negative for headaches.  Hematological: Does not bruise/bleed easily.      Allergies  Bee pollen; Other; Shrimp; Vancomycin; Wheat bran; and Latex  Home Medications   Prior to Admission medications   Medication Sig Start Date End Date Taking? Authorizing Provider  ibuprofen (ADVIL,MOTRIN) 200 MG tablet Take 800 mg by mouth every 6 (six) hours as needed for pain.   Yes Historical Provider, MD  Multiple Vitamin (MULTIVITAMIN WITH MINERALS) TABS tablet Take 1 tablet by mouth daily.   Yes Historical Provider, MD  oxyCODONE-acetaminophen (PERCOCET) 10-325 MG per tablet Take 1 tablet by mouth every 4 (four) hours as needed for pain.   Yes Historical Provider, MD  tetrahydrozoline-zinc (VISINE-AC) 0.05-0.25 % ophthalmic solution Place 2 drops into both eyes 2 (two) times daily as needed (for itching).    Yes Historical Provider, MD  tiZANidine (ZANAFLEX) 4 MG tablet Take 4 mg by mouth every 6 (six) hours as needed (muscle spasms).   Yes Historical Provider, MD  naproxen (NAPROSYN) 500 MG tablet Take 1 tablet (500 mg total) by mouth 2 (two) times daily. 03/23/14   Mervin Kung, MD   BP 117/75  Pulse 60  Temp(Src) 97.6 F (36.4 C) (Oral)  Resp 15  Wt 204 lb (92.534 kg)  SpO2 100% Physical Exam  Nursing note and vitals reviewed. Constitutional: He is oriented to person, place, and time. He appears well-developed and well-nourished. No distress.  HENT:  Head: Normocephalic and atraumatic.  Mouth/Throat: Oropharynx is clear and moist.  Eyes: Conjunctivae  and EOM are normal. Pupils are equal, round, and reactive to light.  Neck: Normal range of motion.  Cardiovascular: Normal rate, regular rhythm and normal heart sounds.   Pulmonary/Chest: Effort normal and breath sounds normal. No respiratory distress.  Abdominal: Soft. Bowel sounds are normal. There is no tenderness.  Musculoskeletal: Normal range of motion. He exhibits no tenderness.  Neurological: He is alert and  oriented to person, place, and time. No cranial nerve deficit. He exhibits normal muscle tone. Coordination normal.  Skin: Skin is warm. No rash noted.    ED Course  Procedures (including critical care time) Labs Review Labs Reviewed  BASIC METABOLIC PANEL - Abnormal; Notable for the following:    GFR calc non Af Amer 86 (*)    All other components within normal limits  CBC  D-DIMER, QUANTITATIVE  I-STAT TROPOININ, ED   Results for orders placed during the hospital encounter of 03/23/14  CBC      Result Value Ref Range   WBC 7.3  4.0 - 10.5 K/uL   RBC 4.82  4.22 - 5.81 MIL/uL   Hemoglobin 14.1  13.0 - 17.0 g/dL   HCT 43.3  39.0 - 52.0 %   MCV 89.8  78.0 - 100.0 fL   MCH 29.3  26.0 - 34.0 pg   MCHC 32.6  30.0 - 36.0 g/dL   RDW 13.7  11.5 - 15.5 %   Platelets 263  150 - 400 K/uL  BASIC METABOLIC PANEL      Result Value Ref Range   Sodium 144  137 - 147 mEq/L   Potassium 5.0  3.7 - 5.3 mEq/L   Chloride 107  96 - 112 mEq/L   CO2 25  19 - 32 mEq/L   Glucose, Bld 93  70 - 99 mg/dL   BUN 22  6 - 23 mg/dL   Creatinine, Ser 1.04  0.50 - 1.35 mg/dL   Calcium 9.5  8.4 - 10.5 mg/dL   GFR calc non Af Amer 86 (*) >90 mL/min   GFR calc Af Amer >90  >90 mL/min  D-DIMER, QUANTITATIVE      Result Value Ref Range   D-Dimer, Quant <0.27  0.00 - 0.48 ug/mL-FEU  I-STAT TROPOININ, ED      Result Value Ref Range   Troponin i, poc 0.00  0.00 - 0.08 ng/mL   Comment 3              Imaging Review Dg Chest 2 View  03/23/2014   CLINICAL DATA:  Left upper chest and axilla pain  EXAM: CHEST  2 VIEW  COMPARISON:  01/08/2014  FINDINGS: The heart size and mediastinal contours are within normal limits. Both lungs are clear. The visualized skeletal structures are unremarkable.  IMPRESSION: No active cardiopulmonary disease.   Electronically Signed   By: Skipper Cliche M.D.   On: 03/23/2014 13:18     EKG Interpretation   Date/Time:  Thursday Mar 23 2014 10:28:22 EDT Ventricular Rate:  80 PR  Interval:  180 QRS Duration: 98 QT Interval:  368 QTC Calculation: 424 R Axis:   35 Text Interpretation:  Normal sinus rhythm Normal ECG Confirmed by  Everline Mahaffy  MD, Jovon Winterhalter ([id number]) on 03/23/2014 11:59:59 AM      MDM   Final diagnoses:  Chest wall pain  Chronic pain    Patient with chronic pain in this area. Patient with some increase a left-sided chest pain that started couple days ago. Patient was here to be seen  last night with the weight was 5 hours so he left. Patient's bone is negative today EKG without acute changes. This sounds like chest wall pain made worse by movement. It is under disability lawsuit proceedings. Followed by pain management up in [place] works there but lives here. We'll try Naprosyn. Patient with some improvement here with IM hydromorphone. X-ray of rules out pneumothorax is had a history that in the past.  D-dimer also negative so not consistent with pulmonary embolus.  Mervin Kung, MD 03/23/14 [phone]

## 2014-03-23 NOTE — ED Notes (Signed)
Pt reports cp was seen here last night but couldn't wait. Reports pain is worse with deep breathing and when he turns his head. Is taking percocet for pain today at 0500 and 0900. Pt is a x 4. Skin warm and dry.

## 2014-03-23 NOTE — ED Notes (Signed)
Pt returned from xray. Clutching chest. C/o 8/10 chest pain. Dr. Rogene Houston aware. New orders received.

## 2014-03-23 NOTE — ED Notes (Signed)
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 Pt reports pain is spaspm, stabbing, sharp with movement of neck. Denies SOB, diaphoresis. Pt awake, alert, VSS, NAd at present.

## 2014-03-23 NOTE — Discharge Instructions (Signed)

## 2014-03-28 NOTE — Discharge Planning (Signed)
Pt was not seen by the [facility], Resource guide and [facility] orange card information will be mailed to the address listed.

## 2014-09-18 ENCOUNTER — Encounter: Payer: Self-pay | Admitting: Internal Medicine

## 2014-09-18 ENCOUNTER — Ambulatory Visit: Payer: Self-pay | Attending: Internal Medicine | Admitting: Internal Medicine

## 2014-09-18 VITALS — BP 132/86 | HR 67 | Temp 98.0°F | Resp 16 | Wt 198.0 lb

## 2014-09-18 DIAGNOSIS — Z139 Encounter for screening, unspecified: Secondary | ICD-10-CM

## 2014-09-18 DIAGNOSIS — M545 Low back pain, unspecified: Secondary | ICD-10-CM | POA: Insufficient documentation

## 2014-09-18 DIAGNOSIS — G8929 Other chronic pain: Secondary | ICD-10-CM | POA: Insufficient documentation

## 2014-09-18 DIAGNOSIS — F17210 Nicotine dependence, cigarettes, uncomplicated: Secondary | ICD-10-CM | POA: Insufficient documentation

## 2014-09-18 DIAGNOSIS — F1290 Cannabis use, unspecified, uncomplicated: Secondary | ICD-10-CM | POA: Insufficient documentation

## 2014-09-18 DIAGNOSIS — Z791 Long term (current) use of non-steroidal anti-inflammatories (NSAID): Secondary | ICD-10-CM | POA: Insufficient documentation

## 2014-09-18 MED ORDER — GABAPENTIN 300 MG PO CAPS: 300.0000 mg | ORAL_CAPSULE | Freq: Three times a day (TID) | ORAL | Status: DC

## 2014-09-18 NOTE — Progress Notes (Signed)
Patient here to establish care Has a history of herniated discs from a boat falling on him Has a hard time standing and sitting for long periods of time

## 2014-09-18 NOTE — Progress Notes (Signed)
Patient Demographics  Bradley Colon, is a 43 y.o. male  ERD:[id number]  HUD:[id number]  DOB - June 04, 1971  CC:  Chief Complaint  Patient presents with  . Establish Care    back pain       HPI: Bradley Colon is a 43 y.o. male here today to establish medical care.patient has history of chronic neck pain which happened after he had an accident at his job and was following up [place] and is taking muscle relaxants and pain medication, the symptoms are controlled but as per patient he is also having chronic low back pain, denies any recent fall or trauma denies any incontinence sometimes he feels numbness in his legs . Patient denies any fever chills. Patient has No headache, No chest pain, No abdominal pain - No Nausea, No new weakness tingling or numbness, No Cough - SOB.  Allergies  Allergen Reactions  . Bee Pollen Anaphylaxis and Swelling  . Other Shortness Of Breath    AGENT: grass  . Shrimp [Shellfish Allergy] Swelling and Other (See Comments)    Muscle cramps  . Vancomycin Itching  . Wheat Bran Swelling    Some swelling  . Latex Hives and Rash   Past Medical History  Diagnosis Date  . Asthma   . Kidney stones   . Cervical radiculopathy   . Reactive airway disease    Current Outpatient Prescriptions on File Prior to Visit  Medication Sig Dispense Refill  . ibuprofen (ADVIL,MOTRIN) 200 MG tablet Take 800 mg by mouth every 6 (six) hours as needed for pain.    . Multiple Vitamin (MULTIVITAMIN WITH MINERALS) TABS tablet Take 1 tablet by mouth daily.    . naproxen (NAPROSYN) 500 MG tablet Take 1 tablet (500 mg total) by mouth 2 (two) times daily. 14 tablet 0  . oxyCODONE-acetaminophen (PERCOCET) 10-325 MG per tablet Take 1 tablet by mouth every 4 (four) hours as needed for pain.    Marland Kitchen tetrahydrozoline-zinc (VISINE-AC) 0.05-0.25 % ophthalmic solution Place 2 drops into both eyes 2 (two) times daily as needed (for itching).     Marland Kitchen tiZANidine (ZANAFLEX) 4  MG tablet Take 4 mg by mouth every 6 (six) hours as needed (muscle spasms).     No current facility-administered medications on file prior to visit.   Family History  Problem Relation Age of Onset  . Hypertension Maternal Grandmother   . Diabetes Maternal Grandmother   . Cancer Maternal Grandmother    History   Social History  . Marital Status: Married    Spouse Name: N/A    Number of Children: N/A  . Years of Education: N/A   Occupational History  . Not on file.   Social History Main Topics  . Smoking status: Light Tobacco Smoker    Types: Cigars  . Smokeless tobacco: Not on file  . Alcohol Use: Yes     Comment:    . Drug Use: Yes    Special: Marijuana  . Sexual Activity: Not on file   Other Topics Concern  . Not on file   Social History Narrative    Review of Systems: Constitutional: Negative for fever, chills, diaphoresis, activity change, appetite change and fatigue. HENT: Negative for ear pain, nosebleeds, congestion, facial swelling, rhinorrhea, neck pain, neck stiffness and ear discharge.  Eyes: Negative for pain, discharge, redness, itching and visual disturbance. Respiratory: Negative for cough, choking, chest tightness, shortness of breath, wheezing and stridor.  Cardiovascular: Negative for chest pain,  palpitations and leg swelling. Gastrointestinal: Negative for abdominal distention. Genitourinary: Negative for dysuria, urgency, frequency, hematuria, flank pain, decreased urine volume, difficulty urinating and dyspareunia.  Musculoskeletal: Negative for back pain, joint swelling, arthralgia and gait problem. Neurological: Negative for dizziness, tremors, seizures, syncope, facial asymmetry, speech difficulty, weakness, light-headedness, numbness and headaches.  Hematological: Negative for adenopathy. Does not bruise/bleed easily. Psychiatric/Behavioral: Negative for hallucinations, behavioral problems, confusion, dysphoric mood, decreased concentration and  agitation.    Objective:   Filed Vitals:   09/18/14 1700  BP: 132/86  Pulse: 67  Temp: 98 F (36.7 C)  Resp: 16    Physical Exam: Constitutional: Patient appears well-developed and well-nourished. No distress. HENT: Normocephalic, atraumatic, External right and left ear normal. Oropharynx is clear and moist.  Eyes: Conjunctivae and EOM are normal. PERRLA, no scleral icterus. Neck: Normal ROM. Neck supple. No JVD. No tracheal deviation. No thyromegaly. CVS: RRR, S1/S2 +, no murmurs, no gallops, no carotid bruit.  Pulmonary: Effort and breath sounds normal, no stridor, rhonchi, wheezes, rales.  Abdominal: Soft. BS +, no distension, tenderness, rebound or guarding.  Musculoskeletal: Normal range of motion. No edema and no tenderness. With SLR patient complains of back pain no shooting pain.DTR 2+, equal strength both lower extremity is. Neuro: Alert. Normal reflexes, muscle tone coordination. No cranial nerve deficit. Skin: Skin is warm and dry. No rash noted. Not diaphoretic. No erythema. No pallor. Psychiatric: Normal mood and affect. Behavior, judgment, thought content normal.  Lab Results  Component Value Date   WBC 7.3 03/23/2014   HGB 14.1 03/23/2014   HCT 43.3 03/23/2014   MCV 89.8 03/23/2014   PLT 263 03/23/2014   Lab Results  Component Value Date   CREATININE 1.04 03/23/2014   BUN 22 03/23/2014   NA 144 03/23/2014   K 5.0 03/23/2014   CL 107 03/23/2014   CO2 25 03/23/2014    No results found for: HGBA1C Lipid Panel  No results found for: CHOL, TRIG, HDL, CHOLHDL, VLDL, LDLCALC     Assessment and plan:   1. Chronic low back pain  - DG Lumbar Spine Complete; Future - gabapentin (NEURONTIN) 300 MG capsule; Take 1 capsule (300 mg total) by mouth 3 (three) times daily.  Dispense: 90 capsule; Refill: 3  2. Screening Ordered baseline blood work.  - CBC with Differential - COMPLETE METABOLIC PANEL WITH GFR - TSH - Lipid panel - Vit D  25 hydroxy (rtn  osteoporosis monitoring) - Hemoglobin A1c     Health Maintenance Declines flu shot   Return in about 3 months (around 12/19/2014).    Lorayne Marek, MD

## 2014-09-19 LAB — COMPLETE METABOLIC PANEL WITH GFR
ALBUMIN: 4.2 g/dL (ref 3.5–5.2)
ALK PHOS: 67 U/L (ref 39–117)
ALT: 18 U/L (ref 0–53)
AST: 17 U/L (ref 0–37)
BILIRUBIN TOTAL: 0.8 mg/dL (ref 0.2–1.2)
BUN: 13 mg/dL (ref 6–23)
CALCIUM: 9.5 mg/dL (ref 8.4–10.5)
CHLORIDE: 104 meq/L (ref 96–112)
CO2: 27 mEq/L (ref 19–32)
Creat: 1.1 mg/dL (ref 0.50–1.35)
GFR, Est Non African American: 81 mL/min
GLUCOSE: 82 mg/dL (ref 70–99)
Potassium: 4.8 mEq/L (ref 3.5–5.3)
SODIUM: 140 meq/L (ref 135–145)
Total Protein: 7.1 g/dL (ref 6.0–8.3)

## 2014-09-19 LAB — CBC WITH DIFFERENTIAL/PLATELET
Basophils Absolute: 0 10*3/uL (ref 0.0–0.1)
Basophils Relative: 0 % (ref 0–1)
Eosinophils Absolute: 0.3 10*3/uL (ref 0.0–0.7)
Eosinophils Relative: 4 % (ref 0–5)
HCT: 42 % (ref 39.0–52.0)
Hemoglobin: 13.9 g/dL (ref 13.0–17.0)
LYMPHS ABS: 3.1 10*3/uL (ref 0.7–4.0)
LYMPHS PCT: 44 % (ref 12–46)
MCH: 29 pg (ref 26.0–34.0)
MCHC: 33.1 g/dL (ref 30.0–36.0)
MCV: 87.5 fL (ref 78.0–100.0)
MPV: 10.1 fL (ref 9.4–12.4)
Monocytes Absolute: 0.7 10*3/uL (ref 0.1–1.0)
Monocytes Relative: 10 % (ref 3–12)
Neutro Abs: 3 10*3/uL (ref 1.7–7.7)
Neutrophils Relative %: 42 % — ABNORMAL LOW (ref 43–77)
PLATELETS: 304 10*3/uL (ref 150–400)
RBC: 4.8 MIL/uL (ref 4.22–5.81)
RDW: 13.2 % (ref 11.5–15.5)
WBC: 7.1 10*3/uL (ref 4.0–10.5)

## 2014-09-19 LAB — LIPID PANEL
CHOL/HDL RATIO: 3.2 ratio
CHOLESTEROL: 104 mg/dL (ref 0–200)
HDL: 33 mg/dL — AB (ref >39)
LDL CALC: 60 mg/dL (ref 0–99)
TRIGLYCERIDES: 57 mg/dL (ref <150)
VLDL: 11 mg/dL (ref 0–40)

## 2014-09-19 LAB — HEMOGLOBIN A1C
HEMOGLOBIN A1C: 5.5 % (ref <5.7)
Mean Plasma Glucose: 111 mg/dL (ref <117)

## 2014-09-19 LAB — VITAMIN D 25 HYDROXY (VIT D DEFICIENCY, FRACTURES): Vit D, 25-Hydroxy: 38 ng/mL (ref 30–100)

## 2014-09-19 LAB — TSH: TSH: 1.71 u[IU]/mL (ref 0.350–4.500)

## 2014-10-11 ENCOUNTER — Emergency Department (HOSPITAL_COMMUNITY)
Admission: EM | Admit: 2014-10-11 | Discharge: 2014-10-12 | Disposition: A | Discharge status: Home / Self Care | Payer: Self-pay | Attending: Emergency Medicine | Admitting: Emergency Medicine

## 2014-10-11 ENCOUNTER — Encounter (HOSPITAL_COMMUNITY): Payer: Self-pay | Admitting: Adult Health

## 2014-10-11 DIAGNOSIS — Z87442 Personal history of urinary calculi: Secondary | ICD-10-CM | POA: Insufficient documentation

## 2014-10-11 DIAGNOSIS — Z791 Long term (current) use of non-steroidal anti-inflammatories (NSAID): Secondary | ICD-10-CM | POA: Insufficient documentation

## 2014-10-11 DIAGNOSIS — J45909 Unspecified asthma, uncomplicated: Secondary | ICD-10-CM | POA: Insufficient documentation

## 2014-10-11 DIAGNOSIS — Z79899 Other long term (current) drug therapy: Secondary | ICD-10-CM | POA: Insufficient documentation

## 2014-10-11 DIAGNOSIS — L299 Pruritus, unspecified: Secondary | ICD-10-CM | POA: Insufficient documentation

## 2014-10-11 DIAGNOSIS — Z720 Tobacco use: Secondary | ICD-10-CM | POA: Insufficient documentation

## 2014-10-11 DIAGNOSIS — L239 Allergic contact dermatitis, unspecified cause: Secondary | ICD-10-CM | POA: Insufficient documentation

## 2014-10-11 DIAGNOSIS — Z91040 Latex allergy status: Secondary | ICD-10-CM | POA: Insufficient documentation

## 2014-10-11 DIAGNOSIS — Z8739 Personal history of other diseases of the musculoskeletal system and connective tissue: Secondary | ICD-10-CM | POA: Insufficient documentation

## 2014-10-11 NOTE — ED Notes (Signed)
Presents with intermittent itching and red eyes for two weeks, it comes and goes and is better with benadryl. He is also requesting a x ray on his lower back because he had one scheduled with [facility] but he has not gotten it yet.

## 2014-10-12 MED ORDER — HYDROCORTISONE 2.5 % EX CREA: TOPICAL_CREAM | Freq: Two times a day (BID) | CUTANEOUS | Status: DC | PRN

## 2014-10-12 MED ORDER — HYDROXYZINE HCL 25 MG PO TABS: 25.0000 mg | ORAL_TABLET | Freq: Four times a day (QID) | ORAL | Status: DC | PRN

## 2014-10-12 MED ORDER — FEXOFENADINE HCL 60 MG PO TABS: 180.0000 mg | ORAL_TABLET | Freq: Every day | ORAL | Status: DC

## 2014-10-12 NOTE — ED Provider Notes (Signed)
CSN: [id number]     Arrival date & time 10/11/14  2344 History  This chart was scribed for non-physician practitioner, Zacarias Pontes, PA-C working with Debby Freiberg, MD by Tula Nakayama, ED scribe. This patient was seen in room TR07C/TR07C and the patient's care was started at 12:47 AM   Chief Complaint  Patient presents with  . Pruritis   Patient is a 43 y.o. male presenting with rash. The history is provided by the patient. No language interpreter was used.  Rash Location:  Full body Quality: itchiness   Severity:  Moderate Onset quality:  Gradual Duration:  2 weeks Timing:  Intermittent Progression:  Unchanged Chronicity:  New Context comment:  Exposure to allergens Relieved by:  Antihistamines Worsened by:  Continued exposure to allergens Ineffective treatments:  None tried Associated symptoms: no abdominal pain, no fever, no headaches, no hoarse voice, no induration, no joint pain, no myalgias, no nausea, no periorbital edema, no shortness of breath, no sore throat, no throat swelling, no tongue swelling, no URI, not vomiting and not wheezing     HPI Comments: Bradley Colon is a 43 y.o. male with a PMHx of multiple allergies and asthma/RAD, who presents to the Emergency Department complaining of intermittent, unchanged full body rash and itchiness that started 2 weeks ago. Pt has tried Benadryl and Zyrtec with relief to symptoms, but when he is exposed to perfumes, lotions, certain clothing items/soaps, or any other allergens the itching and rash returns again and he has to take another dose of benadryl to help relieve it once more. He notes symptoms become worse with exposure to heat and "excitement" such as when he becomes anxious. Pt states a history of scabies infection in his home, but reports that current symptoms are not consistent with prior reaction, and that he had successfully eradicated that before. No other affected individuals at home at this time. He states he's  "allergic to everything" and that multiple allergens set him off, but he can't think of a particular allergen that started this rash. He states that since his rash has started, he also noted he's had itchy eyes and sneezing, which resolves with benadryl as well. Pt denies fever, chills, abdominal pain, CP, SOB, wheezing and difficulty breathing, lip/tongue/facial swelling, or hoarse voice as associated symptoms.  Past Medical History  Diagnosis Date  . Asthma   . Kidney stones   . Cervical radiculopathy   . Reactive airway disease    Past Surgical History  Procedure Laterality Date  . Shoulder surgery     Family History  Problem Relation Age of Onset  . Hypertension Maternal Grandmother   . Diabetes Maternal Grandmother   . Cancer Maternal Grandmother    History  Substance Use Topics  . Smoking status: Light Tobacco Smoker    Types: Cigars  . Smokeless tobacco: Not on file  . Alcohol Use: Yes     Comment:      Review of Systems  Constitutional: Negative for fever and chills.  HENT: Positive for sneezing. Negative for facial swelling, hoarse voice and sore throat.   Eyes: Positive for itching.  Respiratory: Negative for shortness of breath and wheezing.   Cardiovascular: Negative for chest pain.  Gastrointestinal: Negative for nausea, vomiting and abdominal pain.  Musculoskeletal: Negative for myalgias and arthralgias.  Skin: Positive for rash (Itchiness).  Allergic/Immunologic: Positive for environmental allergies.  Neurological: Negative for weakness and headaches.    10 Systems reviewed and all are negative for acute change except as noted  in the HPI.    Allergies  Bee pollen; Other; Shrimp; Vancomycin; Wheat bran; and Latex  Home Medications   Prior to Admission medications   Medication Sig Start Date End Date Taking? Authorizing Provider  gabapentin (NEURONTIN) 300 MG capsule Take 1 capsule (300 mg total) by mouth 3 (three) times daily. 09/18/14   Lorayne Marek, MD  ibuprofen (ADVIL,MOTRIN) 200 MG tablet Take 800 mg by mouth every 6 (six) hours as needed for pain.    Historical Provider, MD  Multiple Vitamin (MULTIVITAMIN WITH MINERALS) TABS tablet Take 1 tablet by mouth daily.    Historical Provider, MD  naproxen (NAPROSYN) 500 MG tablet Take 1 tablet (500 mg total) by mouth 2 (two) times daily. 03/23/14   Fredia Sorrow, MD  oxyCODONE-acetaminophen (PERCOCET) 10-325 MG per tablet Take 1 tablet by mouth every 4 (four) hours as needed for pain.    Historical Provider, MD  tetrahydrozoline-zinc (VISINE-AC) 0.05-0.25 % ophthalmic solution Place 2 drops into both eyes 2 (two) times daily as needed (for itching).     Historical Provider, MD  tiZANidine (ZANAFLEX) 4 MG tablet Take 4 mg by mouth every 6 (six) hours as needed (muscle spasms).    Historical Provider, MD   BP 141/61 mmHg  Pulse 74  Temp(Src) 98.1 F (36.7 C) (Oral)  Resp 18  SpO2 97% Physical Exam  Constitutional: He is oriented to person, place, and time. Vital signs are normal. He appears well-developed and well-nourished.  Non-toxic appearance. No distress.  Afebrile nontoxic NAD  HENT:  Head: Normocephalic and atraumatic.  Mouth/Throat: Oropharynx is clear and moist and mucous membranes are normal.  No tongue/lip swelling  Eyes: Conjunctivae and EOM are normal. Right eye exhibits no discharge. Left eye exhibits no discharge.  No conjunctival injection, eyes clear bilaterally  Neck: Normal range of motion. Neck supple.  Cardiovascular: Normal rate and intact distal pulses.   Pulmonary/Chest: Effort normal. No respiratory distress.  Abdominal: Normal appearance. He exhibits no distension.  Musculoskeletal: Normal range of motion.  Neurological: He is alert and oriented to person, place, and time. He has normal strength. No sensory deficit.  Skin: Skin is warm, dry and intact. No rash noted. No erythema.  No rash or erythema No burrow marks in interdigital web spaces   Psychiatric: He has a normal mood and affect. His behavior is normal.  Nursing note and vitals reviewed.   ED Course  Procedures (including critical care time) DIAGNOSTIC STUDIES: Oxygen Saturation is 97% on RA, normal by my interpretation.    COORDINATION OF CARE: 1:00 AM Discussed treatment plan with pt which includes hydrocortisone, Allegra and Atarax and pt agreed to plan.  Labs Review Labs Reviewed - No data to display  Imaging Review No results found.   EKG Interpretation None      MDM   Final diagnoses:  Pruritic condition  Allergic dermatitis    43 y.o. male with itchy rash that is intermittent x2 wks and associated with allergic symptoms such as itchy eyes and sneezing. Rash currently not present since pt has been using benadryl with relief. Unknown exact allergen but pt has multiple sensitivities to soaps, lotions, perfumes, and clothing/detergents. Discussed use of daily allegra for allergies, as well as PRN vistaril and hydrocortisone cream. Could be some component of eczema but no specific areas noted. No interdigital web space burrow marks or excoriations. Pattern does not match that of scabies. No respiratory issues today. Discussed f/up with PCP in 5 days for ongoing eval and  management, as well as possible referral to allergy and asthma center for skin testing. Discussed avoidance of triggers. I explained the diagnosis and have given explicit precautions to return to the ER including for any other new or worsening symptoms. The patient understands and accepts the medical plan as it's been dictated and I have answered their questions. Discharge instructions concerning home care and prescriptions have been given. The patient is STABLE and is discharged to home in good condition.    I personally performed the services described in this documentation, which was scribed in my presence. The recorded information has been reviewed and is accurate.  BP 122/70 mmHg   Pulse 63  Temp(Src) 97.9 F (36.6 C) (Oral)  Resp 20  SpO2 97%  Meds ordered this encounter  Medications  . hydrOXYzine (ATARAX/VISTARIL) 25 MG tablet    Sig: Take 1 tablet (25 mg total) by mouth every 6 (six) hours as needed for itching.    Dispense:  28 tablet    Refill:  0    Order Specific Question:  Supervising Provider    Answer:  Noemi Chapel D [[id number]]  . hydrocortisone 2.5 % cream    Sig: Apply topically 2 (two) times daily as needed (itching).    Dispense:  15 g    Refill:  0    Order Specific Question:  Supervising Provider    Answer:  Noemi Chapel D [[id number]]  . fexofenadine (ALLEGRA) 60 MG tablet    Sig: Take 3 tablets (180 mg total) by mouth daily.    Dispense:  90 tablet    Refill:  1    Order Specific Question:  Supervising Provider    Answer:  Johnna Acosta [address] [place], PA-C 10/12/14 0221  Debby Freiberg, MD 10/16/14 [phone]

## 2014-10-12 NOTE — Discharge Instructions (Signed)
Take Vistaril, allegra, and hydrocortisone lotion as prescribed. Continue your usual home medications. Get plenty of rest and drink plenty of fluids. Avoid any known triggers. Please followup with your primary doctor for discussion of your diagnoses and further evaluation after today's visit; if you do not have a primary care doctor use the resource guide provided to find one; followup with dermatology as needed   Allergies  Allergies may happen from anything your body is sensitive to. This may be food, medicines, pollens, chemicals, and many other things. Food allergies can be severe and deadly.  HOME CARE  If you do not know what causes a reaction, keep a diary. Write down the foods you ate and the symptoms that followed. Avoid foods that cause reactions.  If you have red raised spots (hives) or a rash:  Take medicine as told by your doctor.  Use medicines for red raised spots and itching as needed.  Apply cold cloths (compresses) to the skin. Take a cool bath. Avoid hot baths or showers.  If you are severely allergic:  It is often necessary to go to the hospital after you have treated your reaction.  Wear your medical alert jewelry.  You and your family must learn how to give a allergy shot or use an allergy kit (anaphylaxis kit).  Always carry your allergy kit or shot with you. Use this medicine as told by your doctor if a severe reaction is occurring. GET HELP RIGHT AWAY IF:  You have trouble breathing or are making high-pitched whistling sounds (wheezing).  You have a tight feeling in your chest or throat.  You have a puffy (swollen) mouth.  You have red raised spots, puffiness (swelling), or itching all over your body.  You have had a severe reaction that was helped by your allergy kit or shot. The reaction can return once the medicine has worn off.  You think you are having a food allergy. Symptoms most often happen within 30 minutes of eating a food.  Your symptoms  have not gone away within 2 days or are getting worse.  You have new symptoms.  You want to retest yourself with a food or drink you think causes an allergic reaction. Only do this under the care of a doctor. MAKE SURE YOU:   Understand these instructions.  Will watch your condition.  Will get help right away if you are not doing well or get worse. Document Released: 02/14/2013 Document Reviewed: 02/14/2013 [facility] Patient Information 2015 [place]. This information is not intended to replace advice given to you by your health care provider. Make sure you discuss any questions you have with your health care provider.  Eczema Eczema, also called atopic dermatitis, is a skin disorder that causes inflammation of the skin. It causes a red rash and dry, scaly skin. The skin becomes very itchy. Eczema is generally worse during the cooler winter months and often improves with the warmth of summer. Eczema usually starts showing signs in infancy. Some children outgrow eczema, but it may last through adulthood.  CAUSES  The exact cause of eczema is not known, but it appears to run in families. People with eczema often have a family history of eczema, allergies, asthma, or hay fever. Eczema is not contagious. Flare-ups of the condition may be caused by:   Contact with something you are sensitive or allergic to.   Stress. SIGNS AND SYMPTOMS  Dry, scaly skin.   Red, itchy rash.   Itchiness. This may occur  before the skin rash and may be very intense.  DIAGNOSIS  The diagnosis of eczema is usually made based on symptoms and medical history. TREATMENT  Eczema cannot be cured, but symptoms usually can be controlled with treatment and other strategies. A treatment plan might include:  Controlling the itching and scratching.   Use over-the-counter antihistamines as directed for itching. This is especially useful at night when the itching tends to be worse.   Use over-the-counter  steroid creams as directed for itching.   Avoid scratching. Scratching makes the rash and itching worse. It may also result in a skin infection (impetigo) due to a break in the skin caused by scratching.   Keeping the skin well moisturized with creams every day. This will seal in moisture and help prevent dryness. Lotions that contain alcohol and water should be avoided because they can dry the skin.   Limiting exposure to things that you are sensitive or allergic to (allergens).   Recognizing situations that cause stress.   Developing a plan to manage stress.  HOME CARE INSTRUCTIONS   Only take over-the-counter or prescription medicines as directed by your health care provider.   Do not use anything on the skin without checking with your health care provider.   Keep baths or showers short (5 minutes) in warm (not hot) water. Use mild cleansers for bathing. These should be unscented. You may add nonperfumed bath oil to the bath water. It is best to avoid soap and bubble bath.   Immediately after a bath or shower, when the skin is still damp, apply a moisturizing ointment to the entire body. This ointment should be a petroleum ointment. This will seal in moisture and help prevent dryness. The thicker the ointment, the better. These should be unscented.   Keep fingernails cut short. Children with eczema may need to wear soft gloves or mittens at night after applying an ointment.   Dress in clothes made of cotton or cotton blends. Dress lightly, because heat increases itching.   A child with eczema should stay away from anyone with fever blisters or cold sores. The virus that causes fever blisters (herpes simplex) can cause a serious skin infection in children with eczema. SEEK MEDICAL CARE IF:   Your itching interferes with sleep.   Your rash gets worse or is not better within 1 week after starting treatment.   You see pus or soft yellow scabs in the rash area.   You  have a fever.   You have a rash flare-up after contact with someone who has fever blisters.  Document Released: 10/17/2000 Document Revised: 08/10/2013 Document Reviewed: 05/23/2013 [facility] Patient Information 2015 [place], [place]. This information is not intended to replace advice given to you by your health care provider. Make sure you discuss any questions you have with your health care provider.

## 2015-01-04 ENCOUNTER — Emergency Department (HOSPITAL_COMMUNITY)
Admission: EM | Admit: 2015-01-04 | Discharge: 2015-01-04 | Disposition: A | Discharge status: Home / Self Care | Payer: Self-pay | Attending: Emergency Medicine | Admitting: Emergency Medicine

## 2015-01-04 ENCOUNTER — Encounter (HOSPITAL_COMMUNITY): Payer: Self-pay | Admitting: *Deleted

## 2015-01-04 ENCOUNTER — Emergency Department (HOSPITAL_COMMUNITY): Payer: Self-pay

## 2015-01-04 DIAGNOSIS — G8929 Other chronic pain: Secondary | ICD-10-CM | POA: Insufficient documentation

## 2015-01-04 DIAGNOSIS — Z79899 Other long term (current) drug therapy: Secondary | ICD-10-CM | POA: Insufficient documentation

## 2015-01-04 DIAGNOSIS — M549 Dorsalgia, unspecified: Secondary | ICD-10-CM

## 2015-01-04 DIAGNOSIS — Z91040 Latex allergy status: Secondary | ICD-10-CM | POA: Insufficient documentation

## 2015-01-04 DIAGNOSIS — Z720 Tobacco use: Secondary | ICD-10-CM | POA: Insufficient documentation

## 2015-01-04 DIAGNOSIS — Z87442 Personal history of urinary calculi: Secondary | ICD-10-CM | POA: Insufficient documentation

## 2015-01-04 DIAGNOSIS — J45909 Unspecified asthma, uncomplicated: Secondary | ICD-10-CM | POA: Insufficient documentation

## 2015-01-04 DIAGNOSIS — M545 Low back pain: Secondary | ICD-10-CM | POA: Insufficient documentation

## 2015-01-04 HISTORY — DX: Dorsalgia, unspecified: M54.9

## 2015-01-04 HISTORY — DX: Other chronic pain: G89.29

## 2015-01-04 MED ORDER — KETOROLAC TROMETHAMINE 60 MG/2ML IM SOLN
60.0000 mg | Freq: Once | INTRAMUSCULAR | Status: AC
  Administered 2015-01-04: 60 mg via INTRAMUSCULAR
  Filled 2015-01-04: qty 2

## 2015-01-04 NOTE — ED Notes (Signed)
Declined W/C at D/C and was escorted to lobby by RN. 

## 2015-01-04 NOTE — ED Notes (Signed)
Pt reports increased back pain on Monday. Pt reports he has pain meds at home but needs a X-Ray.

## 2015-01-04 NOTE — ED Provider Notes (Signed)
CSN: [id number]     Arrival date & time 01/04/15  1307 History  This chart was scribed for Bradley Docker, NP with Maudry Diego, MD by Edison Simon, ED Scribe. This patient was seen in room TR07C/TR07C and the patient's care was started at 1:21 PM.    Chief Complaint  Patient presents with  . Back Pain   Back Pain This is a chronic problem. The current episode started in the past 7 days. The problem occurs constantly. The problem has been gradually worsening since onset. The pain is present in the lumbar spine. Quality: pressure. The pain does not radiate. The pain is moderate. The pain is the same all the time. The symptoms are aggravated by bending and lying down. Pertinent negatives include no weakness. Risk factors include recent trauma. Treatments tried: Zanaflex, Percocet, Diazepam. The treatment provided mild relief.  HPI Comments: Bradley Colon is a 44 y.o. male with history of ongoing back pain who presents to the Emergency Department complaining of back pain, worsening 3 days ago. He states this is an ongoing problem since he had an injury in which he was hurt by a forklift. He states he has difficulty walking or standing for long periods of time. He states he was supposed to get an x-ray at [facility] but had a problem with a doctor there and did not go back for it. He states pain worsened after vacuuming his car 3 days ago. He states he has Zanaflex, percocet, and diazepam at home but they do not resolve his pain. He states it is worse with breathing, laying down, or bending over. He describes it as pressure.    Past Medical History  Diagnosis Date  . Asthma   . Kidney stones   . Cervical radiculopathy   . Reactive airway disease   . Chronic back pain    Past Surgical History  Procedure Laterality Date  . Shoulder surgery     Family History  Problem Relation Age of Onset  . Hypertension Maternal Grandmother   . Diabetes Maternal Grandmother   . Cancer  Maternal Grandmother    History  Substance Use Topics  . Smoking status: Light Tobacco Smoker    Types: Cigars  . Smokeless tobacco: Not on file  . Alcohol Use: Yes     Comment:      Review of Systems  Musculoskeletal: Positive for back pain.  Neurological: Negative for weakness.  All other systems reviewed and are negative.     Allergies  Bee pollen; Other; Shrimp; Vancomycin; Wheat bran; and Latex  Home Medications   Prior to Admission medications   Medication Sig Start Date End Date Taking? Authorizing Provider  fexofenadine (ALLEGRA) 60 MG tablet Take 3 tablets (180 mg total) by mouth daily. 10/12/14   Mercedes Strupp Camprubi-Soms, PA-C  gabapentin (NEURONTIN) 300 MG capsule Take 1 capsule (300 mg total) by mouth 3 (three) times daily. 09/18/14   Lorayne Marek, MD  hydrocortisone 2.5 % cream Apply topically 2 (two) times daily as needed (itching). 10/12/14   Mercedes Strupp Camprubi-Soms, PA-C  hydrOXYzine (ATARAX/VISTARIL) 25 MG tablet Take 1 tablet (25 mg total) by mouth every 6 (six) hours as needed for itching. 10/12/14   Mercedes Strupp Camprubi-Soms, PA-C  ibuprofen (ADVIL,MOTRIN) 200 MG tablet Take 800 mg by mouth every 6 (six) hours as needed for pain.    Historical Provider, MD  Multiple Vitamin (MULTIVITAMIN WITH MINERALS) TABS tablet Take 1 tablet by mouth daily.  Historical Provider, MD  naproxen (NAPROSYN) 500 MG tablet Take 1 tablet (500 mg total) by mouth 2 (two) times daily. 03/23/14   Fredia Sorrow, MD  oxyCODONE-acetaminophen (PERCOCET) 10-325 MG per tablet Take 1 tablet by mouth every 4 (four) hours as needed for pain.    Historical Provider, MD  tetrahydrozoline-zinc (VISINE-AC) 0.05-0.25 % ophthalmic solution Place 2 drops into both eyes 2 (two) times daily as needed (for itching).     Historical Provider, MD  tiZANidine (ZANAFLEX) 4 MG tablet Take 4 mg by mouth every 6 (six) hours as needed (muscle spasms).    Historical Provider, MD   BP 122/69 mmHg   Pulse 87  Temp(Src) 98.6 F (37 C) (Oral)  Resp 15  Ht 5\' 8"  (1.727 m)  Wt 195 lb (88.451 kg)  BMI 29.66 kg/m2  SpO2 100% Physical Exam  Constitutional: He is oriented to person, place, and time. He appears well-developed and well-nourished.  HENT:  Head: Normocephalic and atraumatic.  Eyes: Conjunctivae are normal.  Neck: Normal range of motion. Neck supple.  Pulmonary/Chest: Effort normal.  Musculoskeletal: Normal range of motion.  Moving all extremities normally.lumbar spine and paraspinal tenderness. Good sensation and strength in lower extremities  Neurological: He is alert and oriented to person, place, and time.  Skin: Skin is warm and dry.  Psychiatric: He has a normal mood and affect.  Nursing note and vitals reviewed.   ED Course  Procedures (including critical care time) DIAGNOSTIC STUDIES: Oxygen Saturation is 100% on room air, normal by my interpretation.    COORDINATION OF CARE: 1:28 PM Discussed treatment plan with patient at beside, including x-ray. The patient agrees with the plan and has no further questions at this time.   Labs Review Labs Reviewed - No data to display  Imaging Review Dg Lumbar Spine Complete  01/04/2015   CLINICAL DATA:  Pain following trauma 2 years prior. Recent exacerbation of pain  EXAM: LUMBAR SPINE - COMPLETE 4+ VIEW  COMPARISON:  None.  FINDINGS: Frontal, lateral, spot lumbosacral lateral, bilateral views were obtained. There are 5 non-rib-bearing lumbar type vertebral bodies. There is mild levoscoliosis. There is no fracture or spondylolisthesis. Disc spaces appear intact. There is no appreciable facet arthropathy.  IMPRESSION: Mild scoliosis. No fracture or spondylolisthesis. No appreciable arthropathy.   Electronically Signed   By: Lowella Grip III M.D.   On: 01/04/2015 13:57     EKG Interpretation None      MDM   Final diagnoses:  Chronic back pain    No acute injury noted. Pt is neurologically intact. Pt is okay  to follow up pcp. Pt already has pain medication and muscle relatxers  I personally performed the services described in this documentation, which was scribed in my presence. The recorded information has been reviewed and is accurate.   Bradley Docker, NP 01/04/15 [place], MD 01/05/15 1515

## 2015-01-04 NOTE — Discharge Instructions (Signed)

## 2015-02-28 ENCOUNTER — Ambulatory Visit: Payer: Self-pay | Admitting: Internal Medicine

## 2015-03-14 ENCOUNTER — Ambulatory Visit: Payer: Self-pay | Attending: Internal Medicine | Admitting: Internal Medicine

## 2015-03-14 ENCOUNTER — Encounter: Payer: Self-pay | Admitting: Internal Medicine

## 2015-03-14 VITALS — BP 142/95 | HR 68 | Temp 98.0°F | Resp 16 | Wt 204.0 lb

## 2015-03-14 DIAGNOSIS — M545 Low back pain, unspecified: Secondary | ICD-10-CM

## 2015-03-14 DIAGNOSIS — M62830 Muscle spasm of back: Secondary | ICD-10-CM | POA: Insufficient documentation

## 2015-03-14 DIAGNOSIS — G8929 Other chronic pain: Secondary | ICD-10-CM | POA: Insufficient documentation

## 2015-03-14 NOTE — Progress Notes (Signed)
Patient here for follow up on his chronic back pain Patient complains of generalized pain to his back Shoulders and legs

## 2015-03-14 NOTE — Progress Notes (Signed)
MRN: [id number] Name: Bradley Colon  Sex: male Age: 44 y.o. DOB: 05-Jan-1971  Allergies: Bee pollen; Other; Shrimp; Vancomycin; Wheat bran; and Latex  Chief Complaint  Patient presents with  . Back Pain    HPI: Patient is 44 y.o. male who has to of chronic neck pain, back pain, comes today for followup, on the last visit blood work was done which was reviewed with the patient also patient had lumbar x-ray done which reported IMPRESSION: Mild scoliosis. No fracture or spondylolisthesis. No appreciable arthropathy. patient was prescribed Neurontin on the last visit as per patient he has not started taking the medication he's going to get the prescription filled, denies any incontinence.  Past Medical History  Diagnosis Date  . Asthma   . Kidney stones   . Cervical radiculopathy   . Reactive airway disease   . Chronic back pain     Past Surgical History  Procedure Laterality Date  . Shoulder surgery        Medication List       This list is accurate as of: 03/14/15  1:02 PM.  Always use your most recent med list.               fexofenadine 60 MG tablet  Commonly known as:  ALLEGRA  Take 3 tablets (180 mg total) by mouth daily.     gabapentin 300 MG capsule  Commonly known as:  NEURONTIN  Take 1 capsule (300 mg total) by mouth 3 (three) times daily.     hydrocortisone 2.5 % cream  Apply topically 2 (two) times daily as needed (itching).     hydrOXYzine 25 MG tablet  Commonly known as:  ATARAX/VISTARIL  Take 1 tablet (25 mg total) by mouth every 6 (six) hours as needed for itching.     ibuprofen 200 MG tablet  Commonly known as:  ADVIL,MOTRIN  Take 800 mg by mouth every 6 (six) hours as needed for pain.     multivitamin with minerals Tabs tablet  Take 1 tablet by mouth daily.     naproxen 500 MG tablet  Commonly known as:  NAPROSYN  Take 1 tablet (500 mg total) by mouth 2 (two) times daily.     oxyCODONE-acetaminophen 10-325 MG per tablet  Commonly  known as:  PERCOCET  Take 1 tablet by mouth every 4 (four) hours as needed for pain.     tetrahydrozoline-zinc 0.05-0.25 % ophthalmic solution  Commonly known as:  VISINE-AC  Place 2 drops into both eyes 2 (two) times daily as needed (for itching).     tiZANidine 4 MG tablet  Commonly known as:  ZANAFLEX  Take 4 mg by mouth every 6 (six) hours as needed (muscle spasms).        No orders of the defined types were placed in this encounter.     There is no immunization history on file for this patient.  Family History  Problem Relation Age of Onset  . Hypertension Maternal Grandmother   . Diabetes Maternal Grandmother   . Cancer Maternal Grandmother     History  Substance Use Topics  . Smoking status: Light Tobacco Smoker    Types: Cigars  . Smokeless tobacco: Not on file  . Alcohol Use: Yes     Comment:      Review of Systems   As noted in HPI  Filed Vitals:   03/14/15 1218  BP: 142/95  Pulse: 68  Temp: 98 F (36.7 C)  Resp: 16  Physical Exam  Physical Exam  Constitutional: No distress.  Eyes: EOM are normal. Pupils are equal, round, and reactive to light.  Cardiovascular: Normal rate and regular rhythm.   Pulmonary/Chest: Breath sounds normal. No respiratory distress. He has no wheezes. He has no rales.  Musculoskeletal:  Some lower lumbar paraspinal tenderness    CBC    Component Value Date/Time   WBC 7.1 09/18/2014 1733   RBC 4.80 09/18/2014 1733   HGB 13.9 09/18/2014 1733   HCT 42.0 09/18/2014 1733   PLT 304 09/18/2014 1733   MCV 87.5 09/18/2014 1733   LYMPHSABS 3.1 09/18/2014 1733   MONOABS 0.7 09/18/2014 1733   EOSABS 0.3 09/18/2014 1733   BASOSABS 0.0 09/18/2014 1733    CMP     Component Value Date/Time   NA 140 09/18/2014 1733   K 4.8 09/18/2014 1733   CL 104 09/18/2014 1733   CO2 27 09/18/2014 1733   GLUCOSE 82 09/18/2014 1733   BUN 13 09/18/2014 1733   CREATININE 1.10 09/18/2014 1733   CREATININE 1.04 03/23/2014 1055    CALCIUM 9.5 09/18/2014 1733   PROT 7.1 09/18/2014 1733   ALBUMIN 4.2 09/18/2014 1733   AST 17 09/18/2014 1733   ALT 18 09/18/2014 1733   ALKPHOS 67 09/18/2014 1733   BILITOT 0.8 09/18/2014 1733   GFRNONAA 81 09/18/2014 1733   GFRNONAA 86* 03/23/2014 1055   GFRAA >89 09/18/2014 1733   GFRAA >90 03/23/2014 1055    Lab Results  Component Value Date/Time   CHOL 104 09/18/2014 05:33 PM    Lab Results  Component Value Date/Time   HGBA1C 5.5 09/18/2014 05:33 PM    Lab Results  Component Value Date/Time   AST 17 09/18/2014 05:33 PM    Assessment and Plan  Chronic low back pain/Back muscle spasm Advised patient to apply heating pad, continue with muscle relaxant, he will start taking Neurontin.  Return in about 3 months (around 06/14/2015), or if symptoms worsen or fail to improve.   This note has been created with Surveyor, quantity. Any transcriptional errors are unintentional.    Lorayne Marek, MD

## 2015-04-05 ENCOUNTER — Encounter: Payer: Self-pay | Admitting: Internal Medicine

## 2015-04-05 ENCOUNTER — Ambulatory Visit: Payer: Self-pay | Attending: Internal Medicine | Admitting: Internal Medicine

## 2015-04-05 VITALS — BP 112/70 | HR 68 | Temp 98.0°F | Resp 16 | Wt 205.0 lb

## 2015-04-05 DIAGNOSIS — Z87891 Personal history of nicotine dependence: Secondary | ICD-10-CM | POA: Insufficient documentation

## 2015-04-05 DIAGNOSIS — M419 Scoliosis, unspecified: Secondary | ICD-10-CM | POA: Insufficient documentation

## 2015-04-05 DIAGNOSIS — F17200 Nicotine dependence, unspecified, uncomplicated: Secondary | ICD-10-CM | POA: Insufficient documentation

## 2015-04-05 DIAGNOSIS — M62830 Muscle spasm of back: Secondary | ICD-10-CM | POA: Insufficient documentation

## 2015-04-05 DIAGNOSIS — Z791 Long term (current) use of non-steroidal anti-inflammatories (NSAID): Secondary | ICD-10-CM | POA: Insufficient documentation

## 2015-04-05 DIAGNOSIS — M545 Low back pain, unspecified: Secondary | ICD-10-CM

## 2015-04-05 DIAGNOSIS — G8929 Other chronic pain: Secondary | ICD-10-CM | POA: Insufficient documentation

## 2015-04-05 MED ORDER — GABAPENTIN 300 MG PO CAPS: 300.0000 mg | ORAL_CAPSULE | Freq: Three times a day (TID) | ORAL | Status: DC

## 2015-04-05 NOTE — Progress Notes (Signed)
MRN: [id number] Name: Bradley Colon  Sex: male Age: 44 y.o. DOB: 04/09/1971  Allergies: Bee pollen; Other; Shrimp; Vancomycin; Wheat bran; and Latex  Chief Complaint  Patient presents with  . Sciatica    HPI: Patient is 44 y.o. male who history of chronic low back pain previously had a lumbar x-ray done which reported mild scoliosis no fracture or spondylolisthesis arthropathy,patient denies any incontinence denies any worsening symptoms as per patient sometimes he feels the pain going down to his buttocks, denies any recent fall or trauma, as per patient he has been trying to be physically active and in fact was feeling better 1-2 weeks ago and then he developed again the symptoms, he was prescribed Neurontin which he has not filled the prescription yet.  Past Medical History  Diagnosis Date  . Asthma   . Kidney stones   . Cervical radiculopathy   . Reactive airway disease   . Chronic back pain     Past Surgical History  Procedure Laterality Date  . Shoulder surgery        Medication List       This list is accurate as of: 04/05/15 12:29 PM.  Always use your most recent med list.               fexofenadine 60 MG tablet  Commonly known as:  ALLEGRA  Take 3 tablets (180 mg total) by mouth daily.     gabapentin 300 MG capsule  Commonly known as:  NEURONTIN  Take 1 capsule (300 mg total) by mouth 3 (three) times daily.     hydrocortisone 2.5 % cream  Apply topically 2 (two) times daily as needed (itching).     hydrOXYzine 25 MG tablet  Commonly known as:  ATARAX/VISTARIL  Take 1 tablet (25 mg total) by mouth every 6 (six) hours as needed for itching.     ibuprofen 200 MG tablet  Commonly known as:  ADVIL,MOTRIN  Take 800 mg by mouth every 6 (six) hours as needed for pain.     multivitamin with minerals Tabs tablet  Take 1 tablet by mouth daily.     naproxen 500 MG tablet  Commonly known as:  NAPROSYN  Take 1 tablet (500 mg total) by mouth 2 (two) times  daily.     oxyCODONE-acetaminophen 10-325 MG per tablet  Commonly known as:  PERCOCET  Take 1 tablet by mouth every 4 (four) hours as needed for pain.     tetrahydrozoline-zinc 0.05-0.25 % ophthalmic solution  Commonly known as:  VISINE-AC  Place 2 drops into both eyes 2 (two) times daily as needed (for itching).     tiZANidine 4 MG tablet  Commonly known as:  ZANAFLEX  Take 4 mg by mouth every 6 (six) hours as needed (muscle spasms).        Meds ordered this encounter  Medications  . gabapentin (NEURONTIN) 300 MG capsule    Sig: Take 1 capsule (300 mg total) by mouth 3 (three) times daily.    Dispense:  90 capsule    Refill:  3     There is no immunization history on file for this patient.  Family History  Problem Relation Age of Onset  . Hypertension Maternal Grandmother   . Diabetes Maternal Grandmother   . Cancer Maternal Grandmother     History  Substance Use Topics  . Smoking status: Light Tobacco Smoker    Types: Cigars  . Smokeless tobacco: Not on file  . Alcohol  Use: Yes     Comment:      Review of Systems   As noted in HPI  Filed Vitals:   04/05/15 1142  BP: 112/70  Pulse: 68  Temp: 98 F (36.7 C)  Resp: 16    Physical Exam  Physical Exam  Constitutional: No distress.  Eyes: EOM are normal. Pupils are equal, round, and reactive to light.  Cardiovascular: Normal rate and regular rhythm.   Pulmonary/Chest: Breath sounds normal. No respiratory distress. He has no wheezes. He has no rales.  Musculoskeletal:  Again noted right lower lumbar paraspinal tenderness,    CBC    Component Value Date/Time   WBC 7.1 09/18/2014 1733   RBC 4.80 09/18/2014 1733   HGB 13.9 09/18/2014 1733   HCT 42.0 09/18/2014 1733   PLT 304 09/18/2014 1733   MCV 87.5 09/18/2014 1733   LYMPHSABS 3.1 09/18/2014 1733   MONOABS 0.7 09/18/2014 1733   EOSABS 0.3 09/18/2014 1733   BASOSABS 0.0 09/18/2014 1733    CMP     Component Value Date/Time   NA 140  09/18/2014 1733   K 4.8 09/18/2014 1733   CL 104 09/18/2014 1733   CO2 27 09/18/2014 1733   GLUCOSE 82 09/18/2014 1733   BUN 13 09/18/2014 1733   CREATININE 1.10 09/18/2014 1733   CREATININE 1.04 03/23/2014 1055   CALCIUM 9.5 09/18/2014 1733   PROT 7.1 09/18/2014 1733   ALBUMIN 4.2 09/18/2014 1733   AST 17 09/18/2014 1733   ALT 18 09/18/2014 1733   ALKPHOS 67 09/18/2014 1733   BILITOT 0.8 09/18/2014 1733   GFRNONAA 81 09/18/2014 1733   GFRNONAA 86* 03/23/2014 1055   GFRAA >89 09/18/2014 1733   GFRAA >90 03/23/2014 1055    Lab Results  Component Value Date/Time   CHOL 104 09/18/2014 05:33 PM    Lab Results  Component Value Date/Time   HGBA1C 5.5 09/18/2014 05:33 PM    Lab Results  Component Value Date/Time   AST 17 09/18/2014 05:33 PM    Assessment and Plan  Chronic low back pain - Plan: patient is given the prescription he'll start taking gabapentin (NEURONTIN) 300 MG capsule, if the symptoms are not improving will consider referral to physical therapy.  Back muscle spasm Advise patient to apply heating pad.  Return in about 3 months (around 07/06/2015), or if symptoms worsen or fail to improve.   This note has been created with Surveyor, quantity. Any transcriptional errors are unintentional.    Lorayne Marek, MD

## 2015-04-05 NOTE — Progress Notes (Signed)
Patient complains of his sciatica to his right side has really Been bothering him lately.  If he sits too long it is painful. Patient has started Exercising and walking but the pain has gotten progressively worse

## 2015-07-27 ENCOUNTER — Encounter (HOSPITAL_COMMUNITY): Payer: Self-pay | Admitting: *Deleted

## 2015-07-27 ENCOUNTER — Emergency Department (HOSPITAL_COMMUNITY)
Admission: EM | Admit: 2015-07-27 | Discharge: 2015-07-27 | Disposition: A | Discharge status: Home / Self Care | Payer: Self-pay | Attending: Emergency Medicine | Admitting: Emergency Medicine

## 2015-07-27 DIAGNOSIS — K088 Other specified disorders of teeth and supporting structures: Secondary | ICD-10-CM | POA: Insufficient documentation

## 2015-07-27 DIAGNOSIS — Z87442 Personal history of urinary calculi: Secondary | ICD-10-CM | POA: Insufficient documentation

## 2015-07-27 DIAGNOSIS — K0889 Other specified disorders of teeth and supporting structures: Secondary | ICD-10-CM

## 2015-07-27 DIAGNOSIS — G8929 Other chronic pain: Secondary | ICD-10-CM | POA: Insufficient documentation

## 2015-07-27 DIAGNOSIS — Z91040 Latex allergy status: Secondary | ICD-10-CM | POA: Insufficient documentation

## 2015-07-27 DIAGNOSIS — Z79899 Other long term (current) drug therapy: Secondary | ICD-10-CM | POA: Insufficient documentation

## 2015-07-27 DIAGNOSIS — J45909 Unspecified asthma, uncomplicated: Secondary | ICD-10-CM | POA: Insufficient documentation

## 2015-07-27 DIAGNOSIS — Z720 Tobacco use: Secondary | ICD-10-CM | POA: Insufficient documentation

## 2015-07-27 MED ORDER — IBUPROFEN 400 MG PO TABS: 400.0000 mg | ORAL_TABLET | Freq: Four times a day (QID) | ORAL | Status: DC | PRN

## 2015-07-27 MED ORDER — PENICILLIN V POTASSIUM 250 MG PO TABS: 250.0000 mg | ORAL_TABLET | Freq: Four times a day (QID) | ORAL | Status: AC

## 2015-07-27 NOTE — Discharge Instructions (Signed)

## 2015-07-27 NOTE — ED Notes (Signed)
Pt reports right lower molar pain.

## 2015-07-27 NOTE — ED Notes (Signed)
Patient states he took "a kids antibiotics this morning when I was running in place.  It was the liquid kind and I just drank it".

## 2015-07-27 NOTE — ED Provider Notes (Signed)
CSN: [id number]     Arrival date & time 07/27/15  1057 History  This chart was scribed for non-physician practitioner Montine Circle, PA-C, working with Lacretia Leigh, MD, by Eustaquio Maize, ED Scribe. This patient was seen in room TR06C/TR06C and the patient's care was started at 12:01 PM.  Chief Complaint  Patient presents with  . Dental Pain   The history is provided by the patient. No language interpreter was used.     HPI Comments: Selig Wampole is a 44 y.o. male who presents to the Emergency Department complaining of gradual onset, right lower molar pain x 1 year, gradually worsening. Patient wears a partial.  He states that his dentist is still working on modifying this. The anchor for the partial has been irritating his gumline. Pt is waiting to see the dentist again when he can afford it. The dental pain is exacerbated with exposure to cold air. Pt took children's liquid amoxicillin and Advil without relief. He also complains of right ear pain. Denies fever, chills, or any other associated symptoms.    Past Medical History  Diagnosis Date  . Asthma   . Kidney stones   . Cervical radiculopathy   . Reactive airway disease   . Chronic back pain    Past Surgical History  Procedure Laterality Date  . Shoulder surgery     Family History  Problem Relation Age of Onset  . Hypertension Maternal Grandmother   . Diabetes Maternal Grandmother   . Cancer Maternal Grandmother    Social History  Substance Use Topics  . Smoking status: Light Tobacco Smoker    Types: Cigars  . Smokeless tobacco: None  . Alcohol Use: Yes     Comment:      Review of Systems  Constitutional: Negative for fever and chills.  HENT: Positive for dental problem (Right lower dental pain) and ear pain (Right).   Respiratory: Negative for shortness of breath.   Cardiovascular: Negative for chest pain.  Gastrointestinal: Negative for nausea and vomiting.  Neurological: Negative for headaches.   Allergies   Bee pollen; Other; Shrimp; Vancomycin; Wheat bran; and Latex  Home Medications   Prior to Admission medications   Medication Sig Start Date End Date Taking? Authorizing Provider  fexofenadine (ALLEGRA) 60 MG tablet Take 3 tablets (180 mg total) by mouth daily. 10/12/14   Mercedes Camprubi-Soms, PA-C  gabapentin (NEURONTIN) 300 MG capsule Take 1 capsule (300 mg total) by mouth 3 (three) times daily. 04/05/15   Lorayne Marek, MD  hydrocortisone 2.5 % cream Apply topically 2 (two) times daily as needed (itching). 10/12/14   Mercedes Camprubi-Soms, PA-C  hydrOXYzine (ATARAX/VISTARIL) 25 MG tablet Take 1 tablet (25 mg total) by mouth every 6 (six) hours as needed for itching. 10/12/14   Mercedes Camprubi-Soms, PA-C  ibuprofen (ADVIL,MOTRIN) 200 MG tablet Take 800 mg by mouth every 6 (six) hours as needed for pain.    Historical Provider, MD  Multiple Vitamin (MULTIVITAMIN WITH MINERALS) TABS tablet Take 1 tablet by mouth daily.    Historical Provider, MD  naproxen (NAPROSYN) 500 MG tablet Take 1 tablet (500 mg total) by mouth 2 (two) times daily. 03/23/14   Fredia Sorrow, MD  oxyCODONE-acetaminophen (PERCOCET) 10-325 MG per tablet Take 1 tablet by mouth every 4 (four) hours as needed for pain.    Historical Provider, MD  tetrahydrozoline-zinc (VISINE-AC) 0.05-0.25 % ophthalmic solution Place 2 drops into both eyes 2 (two) times daily as needed (for itching).     Historical Provider, MD  tiZANidine (ZANAFLEX) 4 MG tablet Take 4 mg by mouth every 6 (six) hours as needed (muscle spasms).    Historical Provider, MD   Triage Vitals: BP 155/109 mmHg  Pulse 84  Temp(Src) 98.3 F (36.8 C) (Oral)  Resp 18  SpO2 98%   Physical Exam  Constitutional: He is oriented to person, place, and time. He appears well-developed and well-nourished. No distress.  HENT:  Head: Normocephalic and atraumatic.  Mouth/Throat:    Poor dentition throughout. Anchor for partial in right upper rear.  Mild gingival  irritation, but no abscess.  Affected tooth as diagrammed.  No signs of peritonsillar or tonsillar abscess.  No signs of gingival abscess. Oropharynx is clear and without exudates.  Uvula is midline.  Airway is intact. No signs of Ludwig's angina with palpation of oral and sublingual mucosa.   Eyes: Conjunctivae and EOM are normal.  Neck: Neck supple. No tracheal deviation present.  Cardiovascular: Normal rate.   Pulmonary/Chest: Effort normal. No respiratory distress.  Musculoskeletal: Normal range of motion.  Neurological: He is alert and oriented to person, place, and time.  Skin: Skin is warm and dry.  Psychiatric: He has a normal mood and affect. His behavior is normal.  Nursing note and vitals reviewed.   ED Course  Procedures (including critical care time)  DIAGNOSTIC STUDIES: Oxygen Saturation is 98% on RA, normal by my interpretation.    COORDINATION OF CARE: 12:08 PM-Discussed treatment plan which includes Rx antibiotics and Motrin with pt at bedside and pt agreed to plan.    MDM   Final diagnoses:  Pain, dental   Patient with toothache.  No gross abscess.  Exam unconcerning for Ludwig's angina or spread of infection.  Will treat with penicillin and OTC pain medicine.  Urged patient to follow-up with dentist.    I personally performed the services described in this documentation, which was scribed in my presence. The recorded information has been reviewed and is accurate.       Montine Circle, PA-C 07/28/15 1603  Lacretia Leigh, MD 07/31/15 1248

## 2015-08-10 ENCOUNTER — Ambulatory Visit: Payer: Self-pay | Admitting: Internal Medicine

## 2015-08-14 ENCOUNTER — Ambulatory Visit: Payer: Self-pay | Admitting: Internal Medicine

## 2015-08-15 ENCOUNTER — Ambulatory Visit: Payer: Self-pay

## 2015-08-22 ENCOUNTER — Ambulatory Visit: Payer: Self-pay

## 2015-08-29 ENCOUNTER — Ambulatory Visit: Payer: Self-pay

## 2015-09-21 ENCOUNTER — Encounter (HOSPITAL_COMMUNITY): Payer: Self-pay | Admitting: Emergency Medicine

## 2015-09-21 ENCOUNTER — Emergency Department (HOSPITAL_COMMUNITY)
Admission: EM | Admit: 2015-09-21 | Discharge: 2015-09-21 | Disposition: A | Discharge status: Home / Self Care | Payer: Self-pay | Attending: Emergency Medicine | Admitting: Emergency Medicine

## 2015-09-21 DIAGNOSIS — Z791 Long term (current) use of non-steroidal anti-inflammatories (NSAID): Secondary | ICD-10-CM | POA: Insufficient documentation

## 2015-09-21 DIAGNOSIS — Z91040 Latex allergy status: Secondary | ICD-10-CM | POA: Insufficient documentation

## 2015-09-21 DIAGNOSIS — G8929 Other chronic pain: Secondary | ICD-10-CM | POA: Insufficient documentation

## 2015-09-21 DIAGNOSIS — F17200 Nicotine dependence, unspecified, uncomplicated: Secondary | ICD-10-CM | POA: Insufficient documentation

## 2015-09-21 DIAGNOSIS — Z79899 Other long term (current) drug therapy: Secondary | ICD-10-CM | POA: Insufficient documentation

## 2015-09-21 DIAGNOSIS — J45909 Unspecified asthma, uncomplicated: Secondary | ICD-10-CM | POA: Insufficient documentation

## 2015-09-21 DIAGNOSIS — Z87442 Personal history of urinary calculi: Secondary | ICD-10-CM | POA: Insufficient documentation

## 2015-09-21 DIAGNOSIS — Z8739 Personal history of other diseases of the musculoskeletal system and connective tissue: Secondary | ICD-10-CM | POA: Insufficient documentation

## 2015-09-21 DIAGNOSIS — L259 Unspecified contact dermatitis, unspecified cause: Secondary | ICD-10-CM | POA: Insufficient documentation

## 2015-09-21 MED ORDER — HYDROXYZINE HCL 25 MG PO TABS: 25.0000 mg | ORAL_TABLET | Freq: Four times a day (QID) | ORAL | Status: DC | PRN

## 2015-09-21 MED ORDER — DEXAMETHASONE SODIUM PHOSPHATE 10 MG/ML IJ SOLN
10.0000 mg | Freq: Once | INTRAMUSCULAR | Status: AC
  Administered 2015-09-21: 10 mg via INTRAMUSCULAR
  Filled 2015-09-21: qty 1

## 2015-09-21 NOTE — ED Notes (Signed)
Pt. reports chronic itchy rashes at thighs and lower back for several months .

## 2015-09-21 NOTE — ED Provider Notes (Signed)
CSN: [id number]     Arrival date & time 09/21/15  2101 History  By signing my name below, I, Soijett Blue, attest that this documentation has been prepared under the direction and in the presence of Montine Circle, PA-C Electronically Signed: Soijett Blue, ED Scribe. 09/21/2015. 9:32 PM.   Chief Complaint  Patient presents with  . Rash      The history is provided by the patient. No language interpreter was used.    Bradley Colon is a 44 y.o. male with a medical hx of multiple allergies and reactive airway disease who presents to the Emergency Department complaining of itchy rash, chronically for years, but recently worsened. He states that his rash is to his bilateral thighs and low back. He reports that he has been seen for his symptoms in the past and was treated with hydroxyzine. He states that the hydroxyzine and claritin alleviated his symptoms mildly. He states that he has not seen an allergist for his symptoms. Pt denies new soaps/medications/pets/environment/lotion/detergent. Pt is having associated symptoms of color change. He notes that he has tried claritin and benadryl with no relief of his symptoms. He denies wound, and any other symptoms. Pt is not allergic to any medications. Pt denies allergies to any medications. Denies medical hx of DM.    Past Medical History  Diagnosis Date  . Asthma   . Kidney stones   . Cervical radiculopathy   . Reactive airway disease   . Chronic back pain    Past Surgical History  Procedure Laterality Date  . Shoulder surgery     Family History  Problem Relation Age of Onset  . Hypertension Maternal Grandmother   . Diabetes Maternal Grandmother   . Cancer Maternal Grandmother    Social History  Substance Use Topics  . Smoking status: Light Tobacco Smoker    Types: Cigars  . Smokeless tobacco: None  . Alcohol Use: Yes     Comment:      Review of Systems  Constitutional: Negative for fever.  Skin: Positive for color change and rash.  Negative for wound.    Allergies  Bee pollen; Other; Shrimp; Vancomycin; Wheat bran; and Latex  Home Medications   Prior to Admission medications   Medication Sig Start Date End Date Taking? Authorizing Provider  fexofenadine (ALLEGRA) 60 MG tablet Take 3 tablets (180 mg total) by mouth daily. 10/12/14   Mercedes Camprubi-Soms, PA-C  gabapentin (NEURONTIN) 300 MG capsule Take 1 capsule (300 mg total) by mouth 3 (three) times daily. 04/05/15   Lorayne Marek, MD  hydrocortisone 2.5 % cream Apply topically 2 (two) times daily as needed (itching). 10/12/14   Mercedes Camprubi-Soms, PA-C  hydrOXYzine (ATARAX/VISTARIL) 25 MG tablet Take 1 tablet (25 mg total) by mouth every 6 (six) hours as needed for itching. 10/12/14   Mercedes Camprubi-Soms, PA-C  ibuprofen (ADVIL,MOTRIN) 400 MG tablet Take 1 tablet (400 mg total) by mouth every 6 (six) hours as needed. 07/27/15   Montine Circle, PA-C  Multiple Vitamin (MULTIVITAMIN WITH MINERALS) TABS tablet Take 1 tablet by mouth daily.    Historical Provider, MD  naproxen (NAPROSYN) 500 MG tablet Take 1 tablet (500 mg total) by mouth 2 (two) times daily. 03/23/14   Fredia Sorrow, MD  oxyCODONE-acetaminophen (PERCOCET) 10-325 MG per tablet Take 1 tablet by mouth every 4 (four) hours as needed for pain.    Historical Provider, MD  tetrahydrozoline-zinc (VISINE-AC) 0.05-0.25 % ophthalmic solution Place 2 drops into both eyes 2 (two) times daily as  needed (for itching).     Historical Provider, MD  tiZANidine (ZANAFLEX) 4 MG tablet Take 4 mg by mouth every 6 (six) hours as needed (muscle spasms).    Historical Provider, MD   BP 126/88 mmHg  Pulse 59  Temp(Src) 97.8 F (36.6 C) (Oral)  Resp 16  Ht '5\' 8"'$  (1.727 m)  Wt 194 lb (87.998 kg)  BMI 29.50 kg/m2  SpO2 98% Physical Exam  Constitutional: He is oriented to person, place, and time. He appears well-developed and well-nourished. No distress.  HENT:  Head: Normocephalic and atraumatic.  No stridor, airway  intact  Eyes: EOM are normal.  Neck: Neck supple.  Cardiovascular: Normal rate.   Pulmonary/Chest: Effort normal. No respiratory distress. He has no wheezes.  No wheezes  Musculoskeletal: Normal range of motion.  Neurological: He is alert and oriented to person, place, and time.  Skin: Skin is warm and dry.  Mild maculopapular rash on extremities and low back  Psychiatric: He has a normal mood and affect. His behavior is normal.  Nursing note and vitals reviewed.   ED Course  Procedures (including critical care time) DIAGNOSTIC STUDIES: Oxygen Saturation is 98% on RA, nl by my interpretation.    COORDINATION OF CARE: 9:32 PM Discussed treatment plan with pt at bedside which includes decadron injection, prednisone Rx, refill hydroxyzine Rx, continue with claritin and pt agreed to plan.     MDM   Final diagnoses:  Contact dermatitis    Patient with contact dermatitis.  Will give decadron.  Refill atarax.  Recommend allergist f/u.  I personally performed the services described in this documentation, which was scribed in my presence. The recorded information has been reviewed and is accurate.      Montine Circle, PA-C 09/21/15 2201  Daleen Bo, MD 09/21/15 [phone]

## 2015-09-21 NOTE — Discharge Instructions (Signed)
Contact Dermatitis Dermatitis is redness, soreness, and swelling (inflammation) of the skin. Contact dermatitis is a reaction to certain substances that touch the skin. There are two types of contact dermatitis:   Irritant contact dermatitis. This type is caused by something that irritates your skin, such as dry hands from washing them too much. This type does not require previous exposure to the substance for a reaction to occur. This type is more common.  Allergic contact dermatitis. This type is caused by a substance that you are allergic to, such as a nickel allergy or poison ivy. This type only occurs if you have been exposed to the substance (allergen) before. Upon a repeat exposure, your body reacts to the substance. This type is less common. CAUSES  Many different substances can cause contact dermatitis. Irritant contact dermatitis is most commonly caused by exposure to:   Makeup.   Soaps.   Detergents.   Bleaches.   Acids.   Metal salts, such as nickel.  Allergic contact dermatitis is most commonly caused by exposure to:   Poisonous plants.   Chemicals.   Jewelry.   Latex.   Medicines.   Preservatives in products, such as clothing.  RISK FACTORS This condition is more likely to develop in:   People who have jobs that expose them to irritants or allergens.  People who have certain medical conditions, such as asthma or eczema.  SYMPTOMS  Symptoms of this condition may occur anywhere on your body where the irritant has touched you or is touched by you. Symptoms include:  Dryness or flaking.   Redness.   Cracks.   Itching.   Pain or a burning feeling.   Blisters.  Drainage of small amounts of blood or clear fluid from skin cracks. With allergic contact dermatitis, there may also be swelling in areas such as the eyelids, mouth, or genitals.  DIAGNOSIS  This condition is diagnosed with a medical history and physical exam. A patch skin test  may be performed to help determine the cause. If the condition is related to your job, you may need to see an occupational medicine specialist. TREATMENT Treatment for this condition includes figuring out what caused the reaction and protecting your skin from further contact. Treatment may also include:   Steroid creams or ointments. Oral steroid medicines may be needed in more severe cases.  Antibiotics or antibacterial ointments, if a skin infection is present.  Antihistamine lotion or an antihistamine taken by mouth to ease itching.  A bandage (dressing). HOME CARE INSTRUCTIONS Skin Care  Moisturize your skin as needed.   Apply cool compresses to the affected areas.  Try taking a bath with:  Epsom salts. Follow the instructions on the packaging. You can get these at your local pharmacy or grocery store.  Baking soda. Pour a small amount into the bath as directed by your health care provider.  Colloidal oatmeal. Follow the instructions on the packaging. You can get this at your local pharmacy or grocery store.  Try applying baking soda paste to your skin. Stir water into baking soda until it reaches a paste-like consistency.  Do not scratch your skin.  Bathe less frequently, such as every other day.  Bathe in lukewarm water. Avoid using hot water. Medicines  Take or apply over-the-counter and prescription medicines only as told by your health care provider.   If you were prescribed an antibiotic medicine, take or apply your antibiotic as told by your health care provider. Do not stop using the   antibiotic even if your condition starts to improve. General Instructions  Keep all follow-up visits as told by your health care provider. This is important.  Avoid the substance that caused your reaction. If you do not know what caused it, keep a journal to try to track what caused it. Write down:  What you eat.  What cosmetic products you use.  What you drink.  What  you wear in the affected area. This includes jewelry.  If you were given a dressing, take care of it as told by your health care provider. This includes when to change and remove it. SEEK MEDICAL CARE IF:   Your condition does not improve with treatment.  Your condition gets worse.  You have signs of infection such as swelling, tenderness, redness, soreness, or warmth in the affected area.  You have a fever.  You have new symptoms. SEEK IMMEDIATE MEDICAL CARE IF:   You have a severe headache, neck pain, or neck stiffness.  You vomit.  You feel very sleepy.  You notice red streaks coming from the affected area.  Your bone or joint underneath the affected area becomes painful after the skin has healed.  The affected area turns darker.  You have difficulty breathing.   This information is not intended to replace advice given to you by your health care provider. Make sure you discuss any questions you have with your health care provider.   Document Released: 10/17/2000 Document Revised: 07/11/2015 Document Reviewed: 03/07/2015 Elsevier Interactive Patient Education 2016 [employer]  

## 2015-11-19 ENCOUNTER — Ambulatory Visit: Payer: Self-pay | Attending: Family Medicine | Admitting: Family Medicine

## 2015-11-19 ENCOUNTER — Encounter: Payer: Self-pay | Admitting: Family Medicine

## 2015-11-19 VITALS — BP 121/81 | HR 75 | Temp 98.7°F | Resp 16 | Ht 68.0 in | Wt 198.0 lb

## 2015-11-19 DIAGNOSIS — M545 Low back pain, unspecified: Secondary | ICD-10-CM

## 2015-11-19 DIAGNOSIS — G8929 Other chronic pain: Secondary | ICD-10-CM

## 2015-11-19 DIAGNOSIS — M542 Cervicalgia: Secondary | ICD-10-CM

## 2015-11-19 LAB — POCT GLYCOSYLATED HEMOGLOBIN (HGB A1C): Hemoglobin A1C: 5.5

## 2015-11-19 MED ORDER — ACETAMINOPHEN-CODEINE #3 300-30 MG PO TABS: 1.0000 | ORAL_TABLET | Freq: Three times a day (TID) | ORAL | Status: DC | PRN

## 2015-11-19 MED ORDER — IBUPROFEN 600 MG PO TABS: 600.0000 mg | ORAL_TABLET | Freq: Four times a day (QID) | ORAL | Status: DC | PRN

## 2015-11-19 MED ORDER — TIZANIDINE HCL 4 MG PO TABS: 4.0000 mg | ORAL_TABLET | Freq: Three times a day (TID) | ORAL | Status: DC | PRN

## 2015-11-19 NOTE — Assessment & Plan Note (Signed)
A: chronic pain with radicular symptoms reported. No red flags on history of exam.  Normal lumbar x-ray last year P:  Pain control per orders  PT Pain management
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
## 2015-11-19 NOTE — Assessment & Plan Note (Signed)
A: chronic pain with radicular symptoms reported. No red flags on history of exam.  P:  Pain control per orders  PT Pain management  Cervical x-ray

## 2015-11-19 NOTE — Patient Instructions (Addendum)
Bradley Colon was seen today for establish care and back pain.  Diagnoses and all orders for this visit:  Chronic neck pain -     tiZANidine (ZANAFLEX) 4 MG tablet; Take 1 tablet (4 mg total) by mouth every 8 (eight) hours as needed (muscle spasms). Reported on 11/19/2015 -     acetaminophen-codeine (TYLENOL #3) 300-30 MG tablet; Take 1 tablet by mouth every 8 (eight) hours as needed for moderate pain. -     ibuprofen (ADVIL,MOTRIN) 600 MG tablet; Take 1 tablet (600 mg total) by mouth every 6 (six) hours as needed. -     TSH -     Vitamin B12 -     HgB A1c -     Vitamin D, 25-hydroxy -     DG Cervical Spine 2-3Vclearing; Future -     Ambulatory referral to Physical Therapy -     Ambulatory referral to Pain Clinic  Chronic low back pain -     Ambulatory referral to Physical Therapy -     Ambulatory referral to Pain Clinic   You will be called with lab and x-ray results  F/u in 6 weeks for chronic neck and back pain   Dr. Adrian Blackwater

## 2015-11-20 ENCOUNTER — Telehealth: Payer: Self-pay

## 2015-11-20 LAB — TSH: TSH: 1.167 u[IU]/mL (ref 0.350–4.500)

## 2015-11-20 LAB — VITAMIN D 25 HYDROXY (VIT D DEFICIENCY, FRACTURES): VIT D 25 HYDROXY: 40 ng/mL (ref 30–100)

## 2015-11-20 LAB — VITAMIN B12: Vitamin B-12: 739 pg/mL (ref 211–911)

## 2015-11-20 NOTE — Telephone Encounter (Signed)
-----   Message from Boykin Nearing, MD sent at 11/20/2015 10:35 AM EST ----- All labs normal

## 2015-11-20 NOTE — Telephone Encounter (Signed)
Spoke with patient this am and he is aware of his normal  labs

## 2015-11-26 ENCOUNTER — Ambulatory Visit: Payer: Self-pay | Attending: Family Medicine | Admitting: Physical Therapy

## 2015-11-26 DIAGNOSIS — R6889 Other general symptoms and signs: Secondary | ICD-10-CM | POA: Insufficient documentation

## 2015-11-26 DIAGNOSIS — G8929 Other chronic pain: Secondary | ICD-10-CM | POA: Insufficient documentation

## 2015-11-26 DIAGNOSIS — M5441 Lumbago with sciatica, right side: Secondary | ICD-10-CM | POA: Insufficient documentation

## 2015-11-26 DIAGNOSIS — M542 Cervicalgia: Secondary | ICD-10-CM | POA: Insufficient documentation

## 2015-11-26 DIAGNOSIS — M2560 Stiffness of unspecified joint, not elsewhere classified: Secondary | ICD-10-CM | POA: Insufficient documentation

## 2015-11-26 DIAGNOSIS — R531 Weakness: Secondary | ICD-10-CM | POA: Insufficient documentation

## 2015-11-26 DIAGNOSIS — M5382 Other specified dorsopathies, cervical region: Secondary | ICD-10-CM | POA: Insufficient documentation

## 2015-12-03 ENCOUNTER — Ambulatory Visit: Payer: Self-pay | Admitting: Physical Therapy

## 2015-12-03 DIAGNOSIS — M5382 Other specified dorsopathies, cervical region: Secondary | ICD-10-CM

## 2015-12-03 DIAGNOSIS — G8929 Other chronic pain: Secondary | ICD-10-CM

## 2015-12-03 DIAGNOSIS — R6889 Other general symptoms and signs: Secondary | ICD-10-CM

## 2015-12-03 DIAGNOSIS — M5386 Other specified dorsopathies, lumbar region: Secondary | ICD-10-CM

## 2015-12-03 DIAGNOSIS — M542 Cervicalgia: Secondary | ICD-10-CM

## 2015-12-03 DIAGNOSIS — M5441 Lumbago with sciatica, right side: Secondary | ICD-10-CM

## 2015-12-03 DIAGNOSIS — R531 Weakness: Secondary | ICD-10-CM

## 2015-12-03 NOTE — Patient Instructions (Signed)
Posture Tips DO: - stand tall and erect - keep chin tucked in - keep head and shoulders in alignment - check posture regularly in mirror or large window - pull head back against headrest in car seat;  Change your position often.  Sit with lumbar support. DON'T: - slouch or slump while watching TV or reading - sit, stand or lie in one position  for too long;  Sitting is especially hard on the spine so if you sit at a desk/use the computer, then stand up often!   Copyright  VHI. All rights reserved.  Posture - Standing   Good posture is important. Avoid slouching and forward head thrust. Maintain curve in low back and align ears over shoul- ders, hips over ankles.  Pull your belly button in toward your back bone. Stand with even weight in great toe and little toe and heels, , Rib cage lifted up and chin down.  Not military.     Copyright  VHI. All rights reserved.  Posture - Sitting   Sit upright, head facing forward. Try using a roll to support lower back. Keep shoulders relaxed, and avoid rounded back. Keep hips level with knees. Avoid crossing legs for long periods. Sit on sit bones and not on tailbone with flexed posture.  Do not perch on edge of seat.   [name], PT 12/03/2015 12:01 PM Phone: [phone] Fax: [phone]    Copyright  VHI. All rights reserved.
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
## 2015-12-10 ENCOUNTER — Ambulatory Visit: Payer: Self-pay | Attending: Family Medicine | Admitting: Physical Therapy

## 2015-12-12 ENCOUNTER — Encounter: Payer: Self-pay | Admitting: Physical Therapy

## 2015-12-17 ENCOUNTER — Ambulatory Visit: Payer: Self-pay | Admitting: Physical Therapy

## 2015-12-17 ENCOUNTER — Telehealth: Payer: Self-pay | Admitting: Physical Therapy

## 2015-12-17 NOTE — Telephone Encounter (Signed)
Pt was called and said he was waiting to get financial assistance before continuing.  He said he should attend Wednesday appt this week.  Pt was educated on the attendance policy. Pt was told if he no shows for another appt. ( has a cancel and 2 no shows) , his appointments will be taken off the books and he will need to call to reschedule.  Bradley Colon  Verbalized understanding of the attendance policy.  [name], PT 12/17/2015 12:11 PM Phone: [phone] Fax: [phone]

## 2015-12-19 ENCOUNTER — Ambulatory Visit: Payer: Self-pay | Admitting: Physical Therapy

## 2015-12-24 ENCOUNTER — Ambulatory Visit: Payer: Self-pay | Admitting: Physical Therapy

## 2015-12-24 ENCOUNTER — Telehealth: Payer: Self-pay | Admitting: Physical Therapy

## 2015-12-24 NOTE — Telephone Encounter (Signed)
Pt was called and reminded of attendance policy.  Today was the 3rd no show for appt and 2 cancellation  Pt has not returned since his evaluation.  Pt was left message stating his remaining appt will be cancelled and he will need to call to reschedule due to attendance policy.  Number for outpatient was given.  [place], PT 12/24/2015 1:32 PM Phone: [phone] Fax: [phone]

## 2015-12-26 ENCOUNTER — Encounter: Payer: Self-pay | Admitting: Physical Therapy

## 2015-12-31 ENCOUNTER — Encounter: Payer: Self-pay | Admitting: Physical Therapy

## 2016-01-02 ENCOUNTER — Encounter: Payer: Self-pay | Admitting: Physical Therapy

## 2016-04-09 ENCOUNTER — Ambulatory Visit: Payer: Self-pay | Attending: Internal Medicine

## 2016-05-13 ENCOUNTER — Telehealth: Payer: Self-pay | Admitting: Family Medicine

## 2016-05-13 NOTE — Telephone Encounter (Signed)
Pt. Called requesting to speak with her PCP regarding an x-ray she had ordered for him to have.  Pt. Never had that x-ray done and would like for it to be rescheduled. Pt. Is also requesting a  Letter from his PCP stating that he is able to work. He also has some other questions to ask  His PCP. Please f/u with pt.

## 2016-05-13 NOTE — Telephone Encounter (Signed)
Patient can have x-ray, an appt is not needed. 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 He is advised to schedule OV for letter and other questions with me or first available provider

## 2016-05-13 NOTE — Telephone Encounter (Signed)
Contacted pt to inform him of Dr. Adrian Blackwater advice pt states he will go to the hospital to the get xray done also he will call and schedule an appointment with an provider that's available

## 2016-06-17 ENCOUNTER — Telehealth: Payer: Self-pay | Admitting: Family Medicine

## 2016-06-17 DIAGNOSIS — G8929 Other chronic pain: Secondary | ICD-10-CM

## 2016-06-17 DIAGNOSIS — M545 Low back pain: Principal | ICD-10-CM

## 2016-06-17 DIAGNOSIS — M542 Cervicalgia: Secondary | ICD-10-CM

## 2016-06-17 NOTE — Telephone Encounter (Signed)
Pt. Called stating that he's PCP had prescribed him gabapentin and pt. States that it did not work for him. Pt was then prescribed Tylenol # 3 and pt. Also stated that it makes him drowsy. He would like to be prescribed an Rx that does not make him drowsy. Please f/u

## 2016-06-19 MED ORDER — MELOXICAM 15 MG PO TABS: 15.0000 mg | ORAL_TABLET | Freq: Every day | ORAL | 0 refills | Status: DC

## 2016-06-19 NOTE — Telephone Encounter (Signed)
Called patient verified name and DOB He is having pain in R hip and R leg He also has some of this in his L leg   Pain does not interfere with activity He has pain after physical activity   He reports tylenol #3 made him sick Declines tramadol  He prefers antiinflammatory He has not tried mobic He is willing to try it   Sent in [employer]

## 2016-06-24 ENCOUNTER — Ambulatory Visit: Payer: Self-pay | Admitting: Family Medicine

## 2016-07-22 ENCOUNTER — Ambulatory Visit: Payer: Self-pay | Attending: Family Medicine | Admitting: Family Medicine

## 2016-07-22 ENCOUNTER — Encounter: Payer: Self-pay | Admitting: Family Medicine

## 2016-07-22 ENCOUNTER — Ambulatory Visit: Payer: Self-pay | Admitting: Family Medicine

## 2016-07-22 VITALS — BP 139/89 | HR 75 | Temp 98.4°F | Ht 68.0 in | Wt 190.2 lb

## 2016-07-22 DIAGNOSIS — F17290 Nicotine dependence, other tobacco product, uncomplicated: Secondary | ICD-10-CM | POA: Insufficient documentation

## 2016-07-22 DIAGNOSIS — M542 Cervicalgia: Secondary | ICD-10-CM | POA: Insufficient documentation

## 2016-07-22 DIAGNOSIS — M545 Low back pain, unspecified: Secondary | ICD-10-CM

## 2016-07-22 DIAGNOSIS — R5383 Other fatigue: Secondary | ICD-10-CM | POA: Insufficient documentation

## 2016-07-22 DIAGNOSIS — M25512 Pain in left shoulder: Secondary | ICD-10-CM

## 2016-07-22 DIAGNOSIS — G8929 Other chronic pain: Secondary | ICD-10-CM | POA: Insufficient documentation

## 2016-07-22 LAB — COMPLETE METABOLIC PANEL WITH GFR
ALT: 16 U/L (ref 9–46)
AST: 16 U/L (ref 10–40)
Albumin: 4.2 g/dL (ref 3.6–5.1)
Alkaline Phosphatase: 62 U/L (ref 40–115)
BILIRUBIN TOTAL: 0.6 mg/dL (ref 0.2–1.2)
BUN: 17 mg/dL (ref 7–25)
CO2: 26 mmol/L (ref 20–31)
CREATININE: 1.1 mg/dL (ref 0.60–1.35)
Calcium: 9.4 mg/dL (ref 8.6–10.3)
Chloride: 108 mmol/L (ref 98–110)
GFR, Est Non African American: 80 mL/min (ref >=60)
Glucose, Bld: 84 mg/dL (ref 65–99)
Potassium: 4.8 mmol/L (ref 3.5–5.3)
Sodium: 140 mmol/L (ref 135–146)
Total Protein: 7 g/dL (ref 6.1–8.1)

## 2016-07-22 MED ORDER — GABAPENTIN 300 MG PO CAPS: 300.0000 mg | ORAL_CAPSULE | Freq: Three times a day (TID) | ORAL | 3 refills | Status: DC

## 2016-07-22 NOTE — Patient Instructions (Addendum)
Bradley Colon was seen today for back pain.  Diagnoses and all orders for this visit:  Chronic low back pain -     COMPLETE METABOLIC PANEL WITH GFR -     MR Lumbar Spine Wo Contrast; Future -     Ambulatory referral to Pain Clinic  Chronic neck pain -     MR Cervical Spine Wo Contrast; Future -     Ambulatory referral to Pain Clinic    F/u in 6 weeks for chronic back and neck pain   Dr. Adrian Blackwater
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
## 2016-07-23 ENCOUNTER — Telehealth: Payer: Self-pay

## 2016-07-24 ENCOUNTER — Other Ambulatory Visit: Payer: Self-pay

## 2016-07-27 DIAGNOSIS — R5383 Other fatigue: Secondary | ICD-10-CM | POA: Insufficient documentation

## 2016-07-27 NOTE — Assessment & Plan Note (Signed)
Chronic neck pain MR cervical spine  Pain management referral

## 2016-07-27 NOTE — Assessment & Plan Note (Signed)
Fatigue  CBC Vit D Vit B12

## 2016-07-27 NOTE — Assessment & Plan Note (Signed)
Chronic pain Gabapentin MRI lumbar spine

## 2016-07-27 NOTE — Assessment & Plan Note (Signed)
Chronic left shoulder pain Sports medicine referral for ultrasound and possible steroid injection

## 2016-07-30 ENCOUNTER — Telehealth: Payer: Self-pay

## 2016-07-30 NOTE — Telephone Encounter (Signed)
Pt was called on 9/27 and no VM was set up to leave message.

## 2016-07-31 ENCOUNTER — Ambulatory Visit (HOSPITAL_COMMUNITY): Payer: Self-pay

## 2016-07-31 ENCOUNTER — Ambulatory Visit (HOSPITAL_COMMUNITY): Admission: RE | Admit: 2016-07-31 | Payer: Self-pay | Source: Ambulatory Visit

## 2016-08-01 ENCOUNTER — Ambulatory Visit: Payer: Self-pay | Admitting: Sports Medicine

## 2016-08-02 ENCOUNTER — Ambulatory Visit (HOSPITAL_COMMUNITY)
Admission: RE | Admit: 2016-08-02 | Discharge: 2016-08-02 | Disposition: A | Discharge status: Home / Self Care | Payer: Self-pay | Source: Ambulatory Visit | Attending: Family Medicine | Admitting: Family Medicine

## 2016-08-02 DIAGNOSIS — G8929 Other chronic pain: Secondary | ICD-10-CM | POA: Insufficient documentation

## 2016-08-02 DIAGNOSIS — M545 Low back pain, unspecified: Secondary | ICD-10-CM

## 2016-08-02 DIAGNOSIS — M47892 Other spondylosis, cervical region: Secondary | ICD-10-CM | POA: Insufficient documentation

## 2016-08-02 DIAGNOSIS — M542 Cervicalgia: Secondary | ICD-10-CM | POA: Insufficient documentation

## 2016-08-02 DIAGNOSIS — M778 Other enthesopathies, not elsewhere classified: Secondary | ICD-10-CM | POA: Insufficient documentation

## 2016-08-02 DIAGNOSIS — M4802 Spinal stenosis, cervical region: Secondary | ICD-10-CM | POA: Insufficient documentation

## 2016-08-04 NOTE — Telephone Encounter (Signed)
-----   Message from Boykin Nearing, MD sent at 07/24/2016  8:31 AM EDT ----- Normal metabolic panel, normal kidney and liver function test  Other labs still pending

## 2016-08-04 NOTE — Telephone Encounter (Signed)
Patient verified DOB Patient is aware of CMP,Kidney and Liver function being normal. Patient is aware of other lab test needing to be collected and tested. Patient has scheduled a lab visit for 08/05/16 at 11:00. No further questions at this time.

## 2016-08-05 ENCOUNTER — Ambulatory Visit: Payer: Self-pay | Attending: Family Medicine

## 2016-08-05 ENCOUNTER — Telehealth: Payer: Self-pay | Admitting: *Deleted

## 2016-08-05 DIAGNOSIS — R5383 Other fatigue: Secondary | ICD-10-CM | POA: Insufficient documentation

## 2016-08-05 NOTE — Telephone Encounter (Signed)
Medical Assistant left message on patient's home and cell voicemail. Voicemail states to give a call back to Nubia with [facility] at [phone].  

## 2016-08-05 NOTE — Telephone Encounter (Signed)
-----   Message from Boykin Nearing, MD sent at 08/05/2016  9:13 AM EDT ----- MRI of cervical spine reveals minimal to mild disc bulging. The most significant are of change is at C5-6. There is arthritis, disc bulging, bone spurring- these change are causing moderate narrowing on the foramen (nerve exits) with possible encroachment on the nerve that is worse on the right side.  If pain control cannot be achieved with the help of pain management, neurosurgery evaluation would be the next step.

## 2016-08-05 NOTE — Progress Notes (Signed)
Patient here for lab visit only 

## 2016-08-05 NOTE — Telephone Encounter (Signed)
-----   Message from Boykin Nearing, MD sent at 08/05/2016  9:26 AM EDT ----- Low back MRI looks pretty good. There is a just a little enlargement of the joints at L4-5, L5-SI.

## 2016-08-07 LAB — CBC
HEMATOCRIT: 44.3 % (ref 38.5–50.0)
HEMOGLOBIN: 14.2 g/dL (ref 13.2–17.1)
MCH: 28.6 pg (ref 27.0–33.0)
MCHC: 32.1 g/dL (ref 32.0–36.0)
MCV: 89.3 fL (ref 80.0–100.0)
MPV: 10.9 fL (ref 7.5–12.5)
Platelets: 296 10*3/uL (ref 140–400)
RBC: 4.96 MIL/uL (ref 4.20–5.80)
RDW: 14.2 % (ref 11.0–15.0)
WBC: 6.1 10*3/uL (ref 3.8–10.8)

## 2016-08-07 LAB — VITAMIN B12: Vitamin B-12: 518 pg/mL (ref 200–1100)

## 2016-08-08 ENCOUNTER — Telehealth: Payer: Self-pay

## 2016-08-08 LAB — VITAMIN D 25 HYDROXY (VIT D DEFICIENCY, FRACTURES): Vit D, 25-Hydroxy: 36 ng/mL (ref 30–100)

## 2016-08-08 NOTE — Telephone Encounter (Signed)
Pt was called on 10/06 and informed of lab results being normal.

## 2016-08-11 ENCOUNTER — Ambulatory Visit (INDEPENDENT_AMBULATORY_CARE_PROVIDER_SITE_OTHER): Payer: Self-pay | Admitting: Sports Medicine

## 2016-08-11 ENCOUNTER — Encounter: Payer: Self-pay | Admitting: Sports Medicine

## 2016-08-11 VITALS — BP 125/85 | HR 72

## 2016-08-11 DIAGNOSIS — G8929 Other chronic pain: Secondary | ICD-10-CM

## 2016-08-11 DIAGNOSIS — M25512 Pain in left shoulder: Secondary | ICD-10-CM

## 2016-08-11 DIAGNOSIS — M4722 Other spondylosis with radiculopathy, cervical region: Secondary | ICD-10-CM
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
## 2016-08-16 ENCOUNTER — Encounter (HOSPITAL_COMMUNITY): Payer: Self-pay

## 2016-08-16 ENCOUNTER — Emergency Department (HOSPITAL_COMMUNITY)
Admission: EM | Admit: 2016-08-16 | Discharge: 2016-08-16 | Disposition: A | Discharge status: Home / Self Care | Payer: Self-pay | Attending: Emergency Medicine | Admitting: Emergency Medicine

## 2016-08-16 DIAGNOSIS — F17290 Nicotine dependence, other tobacco product, uncomplicated: Secondary | ICD-10-CM | POA: Insufficient documentation

## 2016-08-16 DIAGNOSIS — Z91040 Latex allergy status: Secondary | ICD-10-CM | POA: Insufficient documentation

## 2016-08-16 DIAGNOSIS — M792 Neuralgia and neuritis, unspecified: Secondary | ICD-10-CM | POA: Insufficient documentation

## 2016-08-16 DIAGNOSIS — Z79899 Other long term (current) drug therapy: Secondary | ICD-10-CM | POA: Insufficient documentation

## 2016-08-16 DIAGNOSIS — J45909 Unspecified asthma, uncomplicated: Secondary | ICD-10-CM | POA: Insufficient documentation

## 2016-08-16 MED ORDER — DIAZEPAM 5 MG PO TABS
5.0000 mg | ORAL_TABLET | Freq: Once | ORAL | Status: AC
  Administered 2016-08-16: 5 mg via ORAL
  Filled 2016-08-16: qty 1

## 2016-08-16 MED ORDER — KETOROLAC TROMETHAMINE 30 MG/ML IJ SOLN
30.0000 mg | Freq: Once | INTRAMUSCULAR | Status: AC
  Administered 2016-08-16: 30 mg via INTRAMUSCULAR
  Filled 2016-08-16: qty 1

## 2016-08-16 NOTE — ED Notes (Signed)
Pt understood dc material. NAD noted. 

## 2016-08-16 NOTE — ED Triage Notes (Signed)
Pt has chronic neck/back pain from injury in 2013.  Onset 2 days left pain in scapula area that radiates around underneath left axilla.  Taking pain meds and muscle relaxers with no relief.

## 2016-08-16 NOTE — ED Provider Notes (Signed)
[facility] DEPT Provider Note   CSN: [id number] Arrival date & time: 08/16/16  1824  By signing my name below, I, Evelene Croon, attest that this documentation has been prepared under the direction and in the presence of non-physician practitioner, Waynetta Pean, PA-C. Electronically Signed: Evelene Croon, Scribe. 08/16/2016. 7:35 PM.   History   Chief Complaint Chief Complaint  Patient presents with  . Back Pain     The history is provided by the patient. No language interpreter was used.     HPI Comments:  Kalum Minner is a 45 y.o. male with a history of chronic back, neck, and left shoulder pain, who presents to the Emergency Department complaining of worsening pain to his left axilla, left upper back, and left neck x 3 days. Pt notes his pain worsened after he yawned and stretched his arms.  He describes his pain as sharp and notes his pain is exacerbated with deep inspiration. He feels pain is over his trapezius and in his axilla. Pt has been taking meds at home ( only Baclofen today) but notes they have not provided adequate relief. Pt denies recent fall or other injury. He denies bowel/bladder incontinence, difficulty moving his arm, chest pain, fevers, and acute numbness/weakness.    Past Medical History:  Diagnosis Date  . Asthma   . Cervical radiculopathy   . Chronic back pain   . Kidney stones   . Reactive airway disease     Patient Active Problem List   Diagnosis Date Noted  . Other fatigue 07/27/2016  . Chronic left shoulder pain 07/22/2016  . Chronic neck pain 11/19/2015  . Chronic low back pain 09/18/2014    Past Surgical History:  Procedure Laterality Date  . SHOULDER SURGERY         Home Medications    Prior to Admission medications   Medication Sig Start Date End Date Taking? Authorizing Provider  gabapentin (NEURONTIN) 300 MG capsule Take 1 capsule (300 mg total) by mouth 3 (three) times daily. 07/22/16   Boykin Nearing, MD  hydrOXYzine  (ATARAX/VISTARIL) 25 MG tablet Take 1 tablet (25 mg total) by mouth every 6 (six) hours as needed for itching. 09/21/15   Montine Circle, PA-C  ibuprofen (ADVIL,MOTRIN) 600 MG tablet Take 1 tablet (600 mg total) by mouth every 6 (six) hours as needed. 11/19/15   Josalyn Funches, MD  tiZANidine (ZANAFLEX) 4 MG tablet Take 1 tablet (4 mg total) by mouth every 8 (eight) hours as needed (muscle spasms). Reported on 11/19/2015 11/19/15   Boykin Nearing, MD    Family History Family History  Problem Relation Age of Onset  . Hypertension Maternal Grandmother   . Diabetes Maternal Grandmother   . Cancer Maternal Grandmother     Social History Social History  Substance Use Topics  . Smoking status: Light Tobacco Smoker    Types: Cigars  . Smokeless tobacco: Never Used  . Alcohol use 0.0 oz/week     Comment:       Allergies   Bee pollen; Other; Shrimp [shellfish allergy]; Vancomycin; Wheat bran; and Latex   Review of Systems Review of Systems  Constitutional: Negative for chills and fever.  Respiratory: Negative for shortness of breath.   Cardiovascular: Negative for chest pain.  Musculoskeletal: Positive for arthralgias, back pain and neck pain.  Skin: Negative for rash and wound.  Neurological: Negative for weakness and numbness.     Physical Exam Updated Vital Signs BP 121/77 (BP Location: Right Arm)   Pulse 67  Temp 98 F (36.7 C) (Oral)   Resp 18   SpO2 97%   Physical Exam  Constitutional: He is oriented to person, place, and time. He appears well-developed and well-nourished. No distress.  Nontoxic appearing.  HENT:  Head: Normocephalic and atraumatic.  Eyes: Conjunctivae are normal. Pupils are equal, round, and reactive to light. Right eye exhibits no discharge. Left eye exhibits no discharge.  Neck: Neck supple.  Cardiovascular: Normal rate, regular rhythm, normal heart sounds and intact distal pulses.   Pulmonary/Chest: Effort normal and breath sounds normal. No  respiratory distress. He has no wheezes. He has no rales. He exhibits no tenderness.  Lungs clear to auscultation bilaterally. No chest wall tenderness to palpation. No crepitus. Symmetric chest expansion bilaterally.  Abdominal: Soft. There is no tenderness.  Musculoskeletal: He exhibits tenderness. He exhibits no edema or deformity.  Tenderness over his left trapezius musculature. No left shoulder deformity, ecchymosis, edema or warmth. He has good ROM of his left shoulder, albeit with pain. Good strength to his bilateral upper extremities.   Lymphadenopathy:    He has no cervical adenopathy.  Neurological: He is alert and oriented to person, place, and time. Coordination normal.  Sensation is intact to his bilateral upper and lower extremities. Normal gait.  Skin: Skin is warm and dry. Capillary refill takes less than 2 seconds. No rash noted. He is not diaphoretic. No erythema. No pallor.  Psychiatric: He has a normal mood and affect. His behavior is normal.  Nursing note and vitals reviewed.    ED Treatments / Results  DIAGNOSTIC STUDIES:  Oxygen Saturation is 96% on RA, normal by my interpretation.    COORDINATION OF CARE:  7:21 PM Discussed treatment plan with pt at bedside and pt agreed to plan.  Labs (all labs ordered are listed, but only abnormal results are displayed) Labs Reviewed - No data to display  EKG  EKG Interpretation None       Radiology No results found.  Procedures Procedures (including critical care time)  Medications Ordered in ED Medications  ketorolac (TORADOL) 30 MG/ML injection 30 mg (30 mg Intramuscular Given 08/16/16 1930)  diazepam (VALIUM) tablet 5 mg (5 mg Oral Given 08/16/16 1930)     Initial Impression / Assessment and Plan / ED Course  I have reviewed the triage vital signs and the nursing notes.  Pertinent labs & imaging results that were available during my care of the patient were reviewed by me and considered in my medical  decision making (see chart for details).  Clinical Course     Patient with a history of chronic left shoulder pain presents to the emergency department complaining of worsening left shoulder pain after stretching and yawning three days ago. On examination is afebrile nontoxic appearing. His tenderness over his left trapezius musculature. He has good range of motion of his left shoulder, I'll be it with pain. He is neurovascularly intact. Based on medical records on August 02, 2016 and had MRI of Lumbar and Cervical spine showing stable mild spondylosis at C5-C6 and generally unremarkable lumbar MRI. 5 days ago he was seen by sports med for chronic left shoulder pain and is due to follow up soon.  Patient reports he's only had baclofen for treatment today. We'll provide him with a muscle relaxer Valium and Toradol in the emergency department. I encouraged him to take his medications as prescribed by the sports medicine physician. I did offer x-ray but, the patient declined today. I encouraged him to follow  closely with sports medicine and I discussed return precautions. I advised the patient to follow-up with their primary care provider this week. I advised the patient to return to the emergency department with new or worsening symptoms or new concerns. The patient verbalized understanding and agreement with plan.    Final Clinical Impressions(s) / ED Diagnoses   Final diagnoses:  Neuropathic pain of left shoulder    New Prescriptions New Prescriptions   No medications on file   I personally performed the services described in this documentation, which was scribed in my presence. The recorded information has been reviewed and is accurate.       Waynetta Pean, PA-C 08/16/16 1935    Forde Dandy, MD 08/17/16 [phone]

## 2016-08-18 ENCOUNTER — Telehealth: Payer: Self-pay | Admitting: Family Medicine

## 2016-08-18 DIAGNOSIS — M545 Low back pain: Principal | ICD-10-CM

## 2016-08-18 DIAGNOSIS — G8929 Other chronic pain: Secondary | ICD-10-CM

## 2016-08-18 DIAGNOSIS — M542 Cervicalgia: Secondary | ICD-10-CM

## 2016-08-18 NOTE — Telephone Encounter (Signed)
Patient said he was prescribed valium while in the hospital. Patient would like to taken off zanaflex and be prescribed valium for a neck and back injury.  Patient feels that valium is more effective than the zanaflex   Please follow up.

## 2016-08-20 ENCOUNTER — Ambulatory Visit: Payer: Self-pay

## 2016-08-20 MED ORDER — DIAZEPAM 10 MG PO TABS: 10.0000 mg | ORAL_TABLET | Freq: Two times a day (BID) | ORAL | 2 refills | Status: DC | PRN

## 2016-08-20 NOTE — Telephone Encounter (Signed)
Contacted pt to inform him of the rx prescribed and information about the controlled substance form

## 2016-08-20 NOTE — Telephone Encounter (Signed)
Noted.  Zanaflex discontinued, [facility] pharmacy called to d/c medication.   Please inform patient that valium is a controlled substance so he will need to review and agree to the controlled substance contract to receive Rx.   Rx ready for pick up

## 2016-08-22 ENCOUNTER — Ambulatory Visit: Payer: Self-pay | Admitting: Family Medicine

## 2016-08-22 NOTE — Telephone Encounter (Signed)
Called pt. To reschedule his appointment and he stated he is in pain and would like to speak with his PCP.  Please f/u

## 2016-09-04 ENCOUNTER — Ambulatory Visit: Payer: Medicaid Other | Attending: Family Medicine | Admitting: Family Medicine

## 2016-09-04 ENCOUNTER — Telehealth: Payer: Self-pay

## 2016-09-04 ENCOUNTER — Encounter: Payer: Self-pay | Admitting: Family Medicine

## 2016-09-04 ENCOUNTER — Ambulatory Visit: Payer: Self-pay | Admitting: Family Medicine

## 2016-09-04 DIAGNOSIS — M5442 Lumbago with sciatica, left side: Secondary | ICD-10-CM | POA: Diagnosis not present

## 2016-09-04 DIAGNOSIS — M25512 Pain in left shoulder: Secondary | ICD-10-CM | POA: Diagnosis not present

## 2016-09-04 DIAGNOSIS — M542 Cervicalgia: Secondary | ICD-10-CM | POA: Diagnosis not present

## 2016-09-04 DIAGNOSIS — F17200 Nicotine dependence, unspecified, uncomplicated: Secondary | ICD-10-CM | POA: Insufficient documentation

## 2016-09-04 DIAGNOSIS — M5441 Lumbago with sciatica, right side: Secondary | ICD-10-CM | POA: Insufficient documentation

## 2016-09-04 DIAGNOSIS — G8929 Other chronic pain: Secondary | ICD-10-CM

## 2016-09-04 DIAGNOSIS — R200 Anesthesia of skin: Secondary | ICD-10-CM | POA: Insufficient documentation

## 2016-09-04 MED ORDER — MELOXICAM 15 MG PO TABS: 15.0000 mg | ORAL_TABLET | Freq: Every day | ORAL | 2 refills | Status: DC

## 2016-09-04 MED ORDER — GABAPENTIN 300 MG PO CAPS: 300.0000 mg | ORAL_CAPSULE | Freq: Two times a day (BID) | ORAL | 3 refills | Status: DC

## 2016-09-04 NOTE — Patient Instructions (Addendum)
Bradley Colon was seen today for back pain.  Diagnoses and all orders for this visit:  Chronic bilateral low back pain with bilateral sciatica -     gabapentin (NEURONTIN) 300 MG capsule; Take 1 capsule (300 mg total) by mouth 2 (two) times daily. -     meloxicam (MOBIC) 15 MG tablet; Take 1 tablet (15 mg total) by mouth daily.   Take the gabapentin twice daily Take mobic instead of ibuprofen Take valium at night since sedation is an issue  F/u in 6 weeks for chronic pain   Dr. Adrian Blackwater
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
## 2016-09-04 NOTE — Progress Notes (Signed)
Pt is here today for back and neck pain.

## 2016-09-04 NOTE — Assessment & Plan Note (Signed)
A: chronic pain P: Pursuing pain management Schedule gabapentin BID Add daily mobic Continue valium 1-2 times daily

## 2016-09-04 NOTE — Telephone Encounter (Signed)
Pt will be seem 11/02 at 3:30

## 2016-11-10 ENCOUNTER — Telehealth: Payer: Self-pay | Admitting: Family Medicine

## 2016-11-10 NOTE — Telephone Encounter (Signed)
Patient called and would like to know if he has arthritis due to chronic pain and feels swelling.Patient states that he has had an MRI done and would like to know if PCP saw anything in the results. Please f/u

## 2016-11-12 NOTE — Telephone Encounter (Signed)
Will route to PCP 

## 2016-11-14 ENCOUNTER — Encounter (HOSPITAL_COMMUNITY): Payer: Self-pay

## 2016-11-14 ENCOUNTER — Emergency Department (HOSPITAL_COMMUNITY)
Admission: EM | Admit: 2016-11-14 | Discharge: 2016-11-14 | Disposition: A | Discharge status: Home / Self Care | Payer: Self-pay | Attending: Emergency Medicine | Admitting: Emergency Medicine

## 2016-11-14 ENCOUNTER — Emergency Department (HOSPITAL_COMMUNITY): Payer: Self-pay

## 2016-11-14 DIAGNOSIS — Z91040 Latex allergy status: Secondary | ICD-10-CM | POA: Insufficient documentation

## 2016-11-14 DIAGNOSIS — F17290 Nicotine dependence, other tobacco product, uncomplicated: Secondary | ICD-10-CM | POA: Insufficient documentation

## 2016-11-14 DIAGNOSIS — J4521 Mild intermittent asthma with (acute) exacerbation: Secondary | ICD-10-CM | POA: Insufficient documentation

## 2016-11-14 LAB — COMPREHENSIVE METABOLIC PANEL
ALBUMIN: 4.1 g/dL (ref 3.5–5.0)
ALT: 23 U/L (ref 17–63)
ANION GAP: 11 (ref 5–15)
AST: 24 U/L (ref 15–41)
Alkaline Phosphatase: 65 U/L (ref 38–126)
BUN: 19 mg/dL (ref 6–20)
CO2: 27 mmol/L (ref 22–32)
Calcium: 10 mg/dL (ref 8.9–10.3)
Chloride: 104 mmol/L (ref 101–111)
Creatinine, Ser: 1.42 mg/dL — ABNORMAL HIGH (ref 0.61–1.24)
GFR calc Af Amer: >60 mL/min (ref >60)
GFR calc non Af Amer: 58 mL/min — ABNORMAL LOW (ref >60)
GLUCOSE: 109 mg/dL — AB (ref 65–99)
POTASSIUM: 4.1 mmol/L (ref 3.5–5.1)
SODIUM: 142 mmol/L (ref 135–145)
Total Bilirubin: 0.6 mg/dL (ref 0.3–1.2)
Total Protein: 7.4 g/dL (ref 6.5–8.1)

## 2016-11-14 LAB — CBC WITH DIFFERENTIAL/PLATELET
Basophils Absolute: 0 10*3/uL (ref 0.0–0.1)
Basophils Relative: 0 %
Eosinophils Absolute: 0.3 10*3/uL (ref 0.0–0.7)
Eosinophils Relative: 4 %
HEMATOCRIT: 43.7 % (ref 39.0–52.0)
Hemoglobin: 14.8 g/dL (ref 13.0–17.0)
LYMPHS ABS: 3.5 10*3/uL (ref 0.7–4.0)
LYMPHS PCT: 46 %
MCH: 28.8 pg (ref 26.0–34.0)
MCHC: 33.9 g/dL (ref 30.0–36.0)
MCV: 85.2 fL (ref 78.0–100.0)
MONO ABS: 0.6 10*3/uL (ref 0.1–1.0)
MONOS PCT: 8 %
NEUTROS ABS: 3.2 10*3/uL (ref 1.7–7.7)
Neutrophils Relative %: 42 %
Platelets: 279 10*3/uL (ref 150–400)
RBC: 5.13 MIL/uL (ref 4.22–5.81)
RDW: 13.2 % (ref 11.5–15.5)
WBC: 7.6 10*3/uL (ref 4.0–10.5)

## 2016-11-14 LAB — I-STAT CG4 LACTIC ACID, ED: LACTIC ACID, VENOUS: 2.73 mmol/L — AB (ref 0.5–1.9)

## 2016-11-14 MED ORDER — IPRATROPIUM-ALBUTEROL 0.5-2.5 (3) MG/3ML IN SOLN
3.0000 mL | Freq: Once | RESPIRATORY_TRACT | Status: AC
  Administered 2016-11-14: 3 mL via RESPIRATORY_TRACT
  Filled 2016-11-14: qty 3

## 2016-11-14 MED ORDER — DEXAMETHASONE SODIUM PHOSPHATE 10 MG/ML IJ SOLN
10.0000 mg | Freq: Once | INTRAMUSCULAR | Status: AC
  Administered 2016-11-14: 10 mg via INTRAVENOUS
  Filled 2016-11-14: qty 1

## 2016-11-14 MED ORDER — ALBUTEROL SULFATE HFA 108 (90 BASE) MCG/ACT IN AERS: 1.0000 | INHALATION_SPRAY | Freq: Four times a day (QID) | RESPIRATORY_TRACT | 0 refills | Status: DC | PRN

## 2016-11-14 MED ORDER — SODIUM CHLORIDE 0.9 % IV BOLUS (SEPSIS)
1000.0000 mL | Freq: Once | INTRAVENOUS | Status: AC
  Administered 2016-11-14: 1000 mL via INTRAVENOUS

## 2016-11-14 MED ORDER — KETOROLAC TROMETHAMINE 30 MG/ML IJ SOLN
30.0000 mg | Freq: Once | INTRAMUSCULAR | Status: AC
  Administered 2016-11-14: 30 mg via INTRAVENOUS
  Filled 2016-11-14: qty 1

## 2016-11-14 NOTE — Discharge Instructions (Signed)
Please read and follow all provided instructions.  Your diagnoses today include:  1. Mild intermittent asthma with exacerbation     Tests performed today include: Vital signs. See below for your results today.   Medications prescribed:  Take as prescribed   Home care instructions:  Follow any educational materials contained in this packet.  Follow-up instructions: Please follow-up with your primary care provider for further evaluation of symptoms and treatment   Return instructions:  Please return to the Emergency Department if you do not get better, if you get worse, or new symptoms OR  - Fever (temperature greater than 101.24F)  - Bleeding that does not stop with holding pressure to the area    -Severe pain (please note that you may be more sore the day after your accident)  - Chest Pain  - Difficulty breathing  - Severe nausea or vomiting  - Inability to tolerate food and liquids  - Passing out  - Skin becoming red around your wounds  - Change in mental status (confusion or lethargy)  - New numbness or weakness    Please return if you have any other emergent concerns.  Additional Information:  Your vital signs today were: BP 117/70    Pulse 63    Temp 98.1 F (36.7 C) (Oral)    Resp 19    Ht '5\' 8"'$  (1.727 m)    Wt 90.7 kg    SpO2 98%    BMI 30.41 kg/m  If your blood pressure (BP) was elevated above 135/85 this visit, please have this repeated by your doctor within one month. ---------------

## 2016-11-14 NOTE — ED Triage Notes (Signed)
Per Pt, Pt has a Hx of asthma. Pt reports when the weather got significantly cold he started to feel his breathing to get harder. Pt didn't have an inhaler at the time, but was able to get one the next day. Pt reports since the start, he started to have significant painful breathing. Pt reports the pain increases with inspiration on the right side that radiates to his right neck. Pt reports feeling like he is having labored breathing. Hx of neck injury with pain noted.

## 2016-11-14 NOTE — ED Notes (Signed)
Notified nurse 1st result from istat lactic acid.

## 2016-11-14 NOTE — ED Notes (Signed)
Patient transported to X-ray 

## 2016-11-14 NOTE — ED Provider Notes (Signed)
[facility] DEPT Provider Note   CSN: [id number] Arrival date & time: 11/14/16  1831     History   Chief Complaint Chief Complaint  Patient presents with  . Asthma    HPI Bradley Colon is a 46 y.o. male.  HPI  46 y.o. male with a hx of Asthma, presents to the Emergency Department today complaining of shortness of breath x several days. Pt states that ever since the weather has gotten colder, he started to have increase in labored breathing. Pt does not have inhaler at this time. Notes pain with inspiration on right side. Rates pain 8/10. Notes relief with inhaler. No fevers. No N/V/D. No ABD pain. No recent travel. No recent surgeries. No hx DVT/PE. No Hx ACS. No other symptoms noted.    Past Medical History:  Diagnosis Date  . Asthma   . Cervical radiculopathy   . Chronic back pain   . Kidney stones   . Reactive airway disease     Patient Active Problem List   Diagnosis Date Noted  . Other fatigue 07/27/2016  . Chronic left shoulder pain 07/22/2016  . Chronic neck pain 11/19/2015  . Chronic low back pain 09/18/2014    Past Surgical History:  Procedure Laterality Date  . EPIDURAL BLOCK INJECTION    . SHOULDER SURGERY         Home Medications    Prior to Admission medications   Medication Sig Start Date End Date Taking? Authorizing Provider  diazepam (VALIUM) 10 MG tablet Take 1 tablet (10 mg total) by mouth every 12 (twelve) hours as needed (pain). 08/20/16   Josalyn Funches, MD  gabapentin (NEURONTIN) 300 MG capsule Take 1 capsule (300 mg total) by mouth 2 (two) times daily. 09/04/16   Josalyn Funches, MD  meloxicam (MOBIC) 15 MG tablet Take 1 tablet (15 mg total) by mouth daily. 09/04/16   Boykin Nearing, MD    Family History Family History  Problem Relation Age of Onset  . Hypertension Maternal Grandmother   . Diabetes Maternal Grandmother   . Cancer Maternal Grandmother     Social History Social History  Substance Use Topics  . Smoking status:  Light Tobacco Smoker    Types: Cigars  . Smokeless tobacco: Never Used  . Alcohol use 0.0 oz/week     Comment:       Allergies   Bee pollen; Other; Shrimp [shellfish allergy]; Vancomycin; Wheat bran; and Latex   Review of Systems Review of Systems ROS reviewed and all are negative for acute change except as noted in the HPI.  Physical Exam Updated Vital Signs BP 128/90   Pulse 74   Temp 98.1 F (36.7 C) (Oral)   Resp 14   Ht '5\' 8"'$  (1.727 m)   Wt 90.7 kg   SpO2 97%   BMI 30.41 kg/m   Physical Exam  Constitutional: He is oriented to person, place, and time. He appears well-developed and well-nourished. No distress.  HENT:  Head: Normocephalic and atraumatic.  Right Ear: Tympanic membrane, external ear and ear canal normal.  Left Ear: Tympanic membrane, external ear and ear canal normal.  Nose: Nose normal.  Mouth/Throat: Uvula is midline, oropharynx is clear and moist and mucous membranes are normal. No trismus in the jaw. No oropharyngeal exudate, posterior oropharyngeal erythema or tonsillar abscesses.  Eyes: EOM are normal. Pupils are equal, round, and reactive to light.  Neck: Normal range of motion. Neck supple. No tracheal deviation present.  Cardiovascular: Normal rate, regular rhythm, S1  normal, S2 normal, normal heart sounds, intact distal pulses and normal pulses.   Pulmonary/Chest: Effort normal. No respiratory distress. He has no decreased breath sounds. He has wheezes in the right upper field and the left upper field. He has no rhonchi. He has no rales.  Abdominal: Normal appearance and bowel sounds are normal. There is no tenderness.  Musculoskeletal: Normal range of motion.  Neurological: He is alert and oriented to person, place, and time.  Skin: Skin is warm and dry.  Psychiatric: He has a normal mood and affect. His speech is normal and behavior is normal. Thought content normal.   ED Treatments / Results  Labs (all labs ordered are listed, but only  abnormal results are displayed) Labs Reviewed  COMPREHENSIVE METABOLIC PANEL - Abnormal; Notable for the following:       Result Value   Glucose, Bld 109 (*)    Creatinine, Ser 1.42 (*)    GFR calc non Af Amer 58 (*)    All other components within normal limits  I-STAT CG4 LACTIC ACID, ED - Abnormal; Notable for the following:    Lactic Acid, Venous 2.73 (*)    All other components within normal limits  CBC WITH DIFFERENTIAL/PLATELET   EKG  EKG Interpretation None      Radiology Dg Chest 2 View  Result Date: 11/14/2016 CLINICAL DATA:  Cough chest pain EXAM: CHEST  2 VIEW COMPARISON:  03/23/2014 FINDINGS: The heart size and mediastinal contours are within normal limits. Both lungs are clear. The visualized skeletal structures are unremarkable. IMPRESSION: No active cardiopulmonary disease. Electronically Signed   By: Donavan Foil M.D.   On: 11/14/2016 20:19    Procedures Procedures (including critical care time)  Medications Ordered in ED Medications - No data to display   Initial Impression / Assessment and Plan / ED Course  I have reviewed the triage vital signs and the nursing notes.  Pertinent labs & imaging results that were available during my care of the patient were reviewed by me and considered in my medical decision making (see chart for details).  Clinical Course    Final Clinical Impressions(s) / ED Diagnoses  {I have reviewed and evaluated the relevant laboratory values. {I have reviewed and evaluated the relevant imaging studies.  {I have reviewed the relevant previous healthcare records.  {I obtained HPI from historian.   ED Course:  Assessment: Pt is a 45yM with hx Asthma who presents with URI symptoms x several weeks. Recent Asthma exacerbation due to cold weather. Inhaler with moderate relief. No fevers. On exam, pt in NAD. Nontoxic/nonseptic appearing. VSS. Afebrile. Lungs with bilateral wheeze. Heart RRR. Abdomen nontender soft. Mild elevation in lactic  acid, but improved with fluids. Likely dehydration related as creatinine with slight elevation. CXR unremarkable. Perc negative. Doubt ACS. Given duoneb and steroids in ED. Pt does not want to take prednisone. Requested decadron injection instead. Plan is to DC home with follow up to PCP. Refilled Inhaler. At time of discharge, Patient is in no acute distress. Vital Signs are stable. Patient is able to ambulate. Patient able to tolerate PO.   Disposition/Plan:  DC Home Additional Verbal discharge instructions given and discussed with patient.  Pt Instructed to f/u with PCP in the next week for evaluation and treatment of symptoms. Return precautions given Pt acknowledges and agrees with plan  Supervising Physician Daleen Bo, MD  Final diagnoses:  Mild intermittent asthma with exacerbation    New Prescriptions New Prescriptions   No medications  on file        Shary Decamp, PA-C 11/14/16 [place], MD 11/15/16 [phone]

## 2016-11-17 ENCOUNTER — Ambulatory Visit: Payer: Medicaid Other | Attending: Family Medicine | Admitting: Family Medicine

## 2016-11-17 ENCOUNTER — Encounter: Payer: Self-pay | Admitting: Family Medicine

## 2016-11-17 VITALS — BP 126/83 | HR 71 | Temp 98.1°F | Ht 68.0 in | Wt 205.8 lb

## 2016-11-17 DIAGNOSIS — M545 Low back pain: Secondary | ICD-10-CM | POA: Diagnosis not present

## 2016-11-17 DIAGNOSIS — Z79899 Other long term (current) drug therapy: Secondary | ICD-10-CM | POA: Insufficient documentation

## 2016-11-17 DIAGNOSIS — M542 Cervicalgia: Secondary | ICD-10-CM | POA: Diagnosis not present

## 2016-11-17 DIAGNOSIS — G8929 Other chronic pain: Secondary | ICD-10-CM

## 2016-11-17 DIAGNOSIS — R200 Anesthesia of skin: Secondary | ICD-10-CM | POA: Insufficient documentation

## 2016-11-17 DIAGNOSIS — Z720 Tobacco use: Secondary | ICD-10-CM | POA: Insufficient documentation

## 2016-11-17 MED ORDER — METHYLPREDNISOLONE SODIUM SUCC 125 MG IJ SOLR
125.0000 mg | Freq: Once | INTRAMUSCULAR | Status: AC
  Administered 2016-11-17: 125 mg via INTRAMUSCULAR

## 2016-11-17 MED ORDER — TURMERIC 500 MG PO CAPS: 500.0000 mg | ORAL_CAPSULE | Freq: Every day | ORAL | Status: DC

## 2016-11-17 MED ORDER — DIAZEPAM 10 MG PO TABS: 10.0000 mg | ORAL_TABLET | Freq: Two times a day (BID) | ORAL | 2 refills | Status: DC | PRN

## 2016-11-17 NOTE — Assessment & Plan Note (Signed)
Chronic pain Refilled valium IM solu medrol  Start tumeric
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
## 2016-11-17 NOTE — Telephone Encounter (Signed)
Pt was called and informed of lab results. Pt has OV today.

## 2016-11-17 NOTE — Patient Instructions (Addendum)
Bradley Colon was seen today for pain, back pain and neck pain.  Diagnoses and all orders for this visit:  Chronic neck pain -     diazepam (VALIUM) 10 MG tablet; Take 1 tablet (10 mg total) by mouth every 12 (twelve) hours as needed (pain). -     methylPREDNISolone sodium succinate (SOLU-MEDROL) 125 mg/2 mL injection 125 mg; Inject 2 mLs (125 mg total) into the muscle once. -     Turmeric 500 MG CAPS; Take 500 mg by mouth daily.  Chronic bilateral low back pain without sciatica -     diazepam (VALIUM) 10 MG tablet; Take 1 tablet (10 mg total) by mouth every 12 (twelve) hours as needed (pain). -     methylPREDNISolone sodium succinate (SOLU-MEDROL) 125 mg/2 mL injection 125 mg; Inject 2 mLs (125 mg total) into the muscle once. -     Turmeric 500 MG CAPS; Take 500 mg by mouth daily.   Start tumeric, natural anti inflammatory   F/u in 6 weeks for chronic neck and back pains  Dr. Adrian Blackwater

## 2016-11-17 NOTE — Telephone Encounter (Signed)
Please inform patient that his neck and  low back MRI done in 07/2016. 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   Since arthritis is an inflammatory condition he was prescribed meloxicam as well as

## 2017-01-07 ENCOUNTER — Telehealth: Payer: Self-pay | Admitting: Family Medicine

## 2017-01-07 DIAGNOSIS — M542 Cervicalgia: Secondary | ICD-10-CM

## 2017-01-07 DIAGNOSIS — M5441 Lumbago with sciatica, right side: Principal | ICD-10-CM

## 2017-01-07 DIAGNOSIS — G8929 Other chronic pain: Secondary | ICD-10-CM

## 2017-01-07 DIAGNOSIS — M545 Low back pain, unspecified: Secondary | ICD-10-CM

## 2017-01-07 DIAGNOSIS — M5442 Lumbago with sciatica, left side: Principal | ICD-10-CM

## 2017-01-07 NOTE — Telephone Encounter (Signed)
Will route to PCP 

## 2017-01-07 NOTE — Telephone Encounter (Signed)
Patient called the office to speak with PCP regarding his medication. Pt stated that he is very concerned about the medication he was prescribed on last office visit. Pt has side effects to gabapentin and is scared that he will have the same side effects. Please follow up.  Thank you.

## 2017-01-09 ENCOUNTER — Telehealth: Payer: Self-pay

## 2017-01-09 MED ORDER — DIAZEPAM 10 MG PO TABS: 10.0000 mg | ORAL_TABLET | Freq: Every evening | ORAL | 2 refills | Status: DC | PRN

## 2017-01-09 MED ORDER — MELOXICAM 15 MG PO TABS: 15.0000 mg | ORAL_TABLET | Freq: Every day | ORAL | 2 refills | Status: DC

## 2017-01-09 NOTE — Telephone Encounter (Signed)
Called patient verified name and DOB He reports he is not taking valium He is swelling and having hand pain   He is eating tumeric for inflammation He is going to [facility] and hot tub  He is willing to try mobic Advised he take with food  Informed him we monitor kidneys as well  He is taking valium Send in refill 10 mg nightly  Ready for pick up

## 2017-01-09 NOTE — Telephone Encounter (Signed)
Pt was called and informed of script being ready for pick up.

## 2017-03-06 ENCOUNTER — Ambulatory Visit: Payer: Self-pay | Attending: Family Medicine | Admitting: Family Medicine

## 2017-03-06 ENCOUNTER — Encounter: Payer: Self-pay | Admitting: Family Medicine

## 2017-03-06 VITALS — BP 106/73 | HR 79 | Temp 98.1°F | Ht 68.0 in | Wt 198.0 lb

## 2017-03-06 DIAGNOSIS — R200 Anesthesia of skin: Secondary | ICD-10-CM | POA: Insufficient documentation

## 2017-03-06 DIAGNOSIS — M542 Cervicalgia: Secondary | ICD-10-CM | POA: Insufficient documentation

## 2017-03-06 DIAGNOSIS — Z79899 Other long term (current) drug therapy: Secondary | ICD-10-CM | POA: Insufficient documentation

## 2017-03-06 DIAGNOSIS — G8929 Other chronic pain: Secondary | ICD-10-CM | POA: Insufficient documentation

## 2017-03-06 DIAGNOSIS — M545 Low back pain: Secondary | ICD-10-CM | POA: Insufficient documentation

## 2017-03-06 DIAGNOSIS — Z720 Tobacco use: Secondary | ICD-10-CM | POA: Insufficient documentation

## 2017-03-06 MED ORDER — MELOXICAM 15 MG PO TABS: 15.0000 mg | ORAL_TABLET | Freq: Every day | ORAL | 2 refills | Status: DC

## 2017-03-06 NOTE — Patient Instructions (Addendum)
Bradley Colon was seen today for back pain.  Diagnoses and all orders for this visit:  Chronic neck pain -     Ambulatory referral to Physical Therapy -     meloxicam (MOBIC) 15 MG tablet; Take 1 tablet (15 mg total) by mouth daily. -     Ambulatory referral to Pain Clinic  Chronic bilateral low back pain without sciatica -     Ambulatory referral to Physical Therapy -     meloxicam (MOBIC) 15 MG tablet; Take 1 tablet (15 mg total) by mouth daily. -     Ambulatory referral to Pain Clinic   f/u in 8 weeks for chronic pain  Dr. Adrian Blackwater

## 2017-03-06 NOTE — Assessment & Plan Note (Signed)
Chronic Pain mobic Pain management, PT

## 2017-03-06 NOTE — Progress Notes (Signed)
Subjective:  Patient ID: Bradley Colon, male    DOB: December 19, 1970  Age: 46 y.o. MRN: [id number]  CC: Back Pain   HPI Bradley Colon presents for    1. Cervical neck pain: since work related injury in 2013. He was hit by a cane and fractured his L shoulder and L first rib fracture.  He also injured his cervical spine in this injury. He has stiffness in his neck, pain and numbness down both arms. This pain has been chronic since his injury. Previous treatments: physical therapy,  epidural injections in [place] because that is where the injury occurred.  Prescription narcotics: oxycodone. Tizanidine. Diazepam and gabapentin. He has stopped taking gabapentin due to swelling in feet and impotence. He is now taking it again but sparingly. Also taking valium. He also takes ibuprofen but it upsets his stomach.   2. Lumbar pain: pain in low back was also injured during the accident in 2013. He has stiffness and weakness after prolonged sitting and lying down.  He also had numbness and dull sensation in his legs. He gets tingling in legs when he bends forward. He has not had lumbar specific treatments. He feels better when he is active. He is severely achy after physical activity.    He was paid for job related injury. He is unemployed and uninsured. On occasion, he still sees pain management in [place]. He denies falls, loss of grip strength, fecal and urinary incontinence.   3. Numbness: in R hand comes and goes. Also has numbness in L hand. Sometimes has numbness in L leg.   Imaging:  Lumbar MRI 9/302017: IMPRESSION: 1. Mild facet hypertrophy in the lower lumbar spine. 2. No disc herniation, spinal stenosis or nerve root encroachment.   Cervical MRI 08/02/2016 MRI of cervical spine reveals minimal to mild disc bulging. The most significant are of change is at C5-6. There is arthritis, disc bulging, bone spurring- these change are causing moderate narrowing on the foramen (nerve exits) with  possible encroachment on the nerve that is worse on the right side.  If pain control cannot be achieved with the help of pain management, neurosurgery evaluation would be the next step.  Valium helps with pain He did not try mobic   Social History  Substance Use Topics  . Smoking status: Light Tobacco Smoker    Types: Cigars  . Smokeless tobacco: Never Used  . Alcohol use 0.0 oz/week     Comment:      Outpatient Medications Prior to Visit  Medication Sig Dispense Refill  . albuterol (PROVENTIL HFA;VENTOLIN HFA) 108 (90 Base) MCG/ACT inhaler Inhale 1 puff into the lungs every 6 (six) hours as needed for wheezing or shortness of breath. 1 Inhaler 0  . Budesonide (PULMICORT IN) Inhale 1 puff into the lungs daily.    . diazepam (VALIUM) 10 MG tablet Take 1 tablet (10 mg total) by mouth at bedtime as needed (pain). 30 tablet 2  . ibuprofen (ADVIL,MOTRIN) 200 MG tablet Take 800 mg by mouth 2 (two) times daily.    Marland Kitchen loratadine (CLARITIN) 10 MG tablet Take 10 mg by mouth daily as needed for itching.    . Multiple Vitamin (MULTIVITAMIN WITH MINERALS) TABS tablet Take 1 tablet by mouth daily.    . Pseudoephedrine-APAP-DM (DAYQUIL PO) Take 15 mLs by mouth daily as needed (congestion/cough).    Marland Kitchen tiZANidine (ZANAFLEX) 2 MG tablet Take 2 mg by mouth daily as needed for muscle spasms.    Marland Kitchen HYDROcodone-acetaminophen (  NORCO) 10-325 MG tablet Take 1 tablet by mouth every 6 (six) hours as needed (pain).    . meloxicam (MOBIC) 15 MG tablet Take 1 tablet (15 mg total) by mouth daily. (Patient not taking: Reported on 03/06/2017) 30 tablet 2  . Turmeric 500 MG CAPS Take 500 mg by mouth daily. (Patient not taking: Reported on 03/06/2017)     No facility-administered medications prior to visit.     ROS Review of Systems  Constitutional: Positive for fatigue. Negative for chills, fever and unexpected weight change.  Eyes: Negative for visual disturbance.  Respiratory: Negative for cough and shortness of  breath.   Cardiovascular: Negative for chest pain, palpitations and leg swelling.  Gastrointestinal: Negative for abdominal pain, blood in stool, constipation, diarrhea, nausea and vomiting.  Endocrine: Negative for polydipsia, polyphagia and polyuria.  Musculoskeletal: Positive for arthralgias (L shoulder ), back pain, myalgias, neck pain and neck stiffness. Negative for gait problem.  Skin: Negative for rash.  Allergic/Immunologic: Negative for immunocompromised state.  Neurological: Positive for numbness (in hands and legs ) and headaches.  Hematological: Negative for adenopathy. Does not bruise/bleed easily.  Psychiatric/Behavioral: Negative for dysphoric mood, sleep disturbance and suicidal ideas. The patient is not nervous/anxious.     Objective:  BP 106/73   Pulse 79   Temp 98.1 F (36.7 C) (Oral)   Ht '5\' 8"'$  (1.727 m)   Wt 198 lb (89.8 kg)   SpO2 97%   BMI 30.11 kg/m   BP/Weight 03/06/2017 11/17/2016 2/56/3893  Systolic BP 734 287 681  Diastolic BP 73 83 84  Wt. (Lbs) 198 205.8 200  BMI 30.11 31.29 30.41   Physical Exam  Constitutional: He appears well-developed and well-nourished. No distress.  HENT:  Head: Normocephalic and atraumatic.  Neck: Neck supple. Spinous process tenderness and muscular tenderness present. No neck rigidity. Decreased range of motion (ROM limited to L ) present. No edema and no erythema present.  Cardiovascular: Normal rate, regular rhythm, normal heart sounds and intact distal pulses.   Pulmonary/Chest: Effort normal.  Abdominal: Hernia confirmed negative in the right inguinal area and confirmed negative in the left inguinal area.  Genitourinary: Testes normal. Circumcised.  Musculoskeletal: He exhibits edema (trace LE ).       Left shoulder: He exhibits pain (in L lateral shoulder ). He exhibits normal range of motion, no tenderness, no bony tenderness, no swelling, no effusion, no crepitus, no deformity, no laceration, no spasm, normal pulse and  normal strength.       Lumbar back: He exhibits decreased range of motion, tenderness, bony tenderness, pain and spasm. He exhibits no swelling, no edema, no deformity, no laceration and normal pulse.  Neurological: He is alert.  Skin: Skin is warm and dry. No rash noted. No erythema.  Psychiatric: He has a normal mood and affect.      Assessment & Plan:  Tyreke was seen today for back pain.  Diagnoses and all orders for this visit:  Chronic neck pain -     Ambulatory referral to Physical Therapy -     meloxicam (MOBIC) 15 MG tablet; Take 1 tablet (15 mg total) by mouth daily. -     Ambulatory referral to Pain Clinic  Chronic bilateral low back pain without sciatica -     Ambulatory referral to Physical Therapy -     meloxicam (MOBIC) 15 MG tablet; Take 1 tablet (15 mg total) by mouth daily. -     Ambulatory referral to Pain Clinic  There are no diagnoses linked to this encounter.  No orders of the defined types were placed in this encounter.   Follow-up: Return in about 8 weeks (around 05/01/2017) for chronic pain .   Boykin Nearing MD

## 2017-03-12 ENCOUNTER — Encounter: Payer: Self-pay | Admitting: Physical Therapy

## 2017-03-12 ENCOUNTER — Telehealth: Payer: Self-pay | Admitting: Family Medicine

## 2017-03-12 ENCOUNTER — Ambulatory Visit: Payer: Self-pay | Attending: Family Medicine | Admitting: Physical Therapy

## 2017-03-12 DIAGNOSIS — M5382 Other specified dorsopathies, cervical region: Secondary | ICD-10-CM | POA: Insufficient documentation

## 2017-03-12 DIAGNOSIS — M6281 Muscle weakness (generalized): Secondary | ICD-10-CM | POA: Insufficient documentation

## 2017-03-12 DIAGNOSIS — R6889 Other general symptoms and signs: Secondary | ICD-10-CM | POA: Insufficient documentation

## 2017-03-12 DIAGNOSIS — G8929 Other chronic pain: Secondary | ICD-10-CM | POA: Insufficient documentation

## 2017-03-12 DIAGNOSIS — R531 Weakness: Secondary | ICD-10-CM | POA: Insufficient documentation

## 2017-03-12 DIAGNOSIS — R293 Abnormal posture: Secondary | ICD-10-CM | POA: Insufficient documentation

## 2017-03-12 DIAGNOSIS — R252 Cramp and spasm: Secondary | ICD-10-CM | POA: Insufficient documentation

## 2017-03-12 DIAGNOSIS — M5412 Radiculopathy, cervical region: Secondary | ICD-10-CM | POA: Insufficient documentation

## 2017-03-12 DIAGNOSIS — M542 Cervicalgia: Secondary | ICD-10-CM | POA: Insufficient documentation

## 2017-03-12 MED FILL — ?MELOXICAM 15 MG TAB: 15 MG | 30 days supply | Qty: 30 | Fill #0

## 2017-03-12 NOTE — Telephone Encounter (Signed)
Pt was called to get more information. There was no answer a VM was left informing pt to return phone cal.

## 2017-03-12 NOTE — Therapy (Signed)
[place], [place], [address] Phone: [phone]   Fax:  [phone]  Physical Therapy Evaluation  Patient Details  Name: Rameen Quinney MRN: [id number] Date of Birth: [birth year] Referring Provider: Boykin Nearing MD  Encounter Date: 03/12/2017      PT End of Session - 03/12/17 1057    Visit Number 1   Number of Visits 17   Date for PT Re-Evaluation 05/07/17   Authorization Type self pay   PT Start Time 1100   PT Stop Time 1151   PT Time Calculation (min) 51 min   Activity Tolerance Patient tolerated treatment well   Behavior During Therapy [facility] for tasks assessed/performed      Past Medical History:  Diagnosis Date  . Asthma   . Cervical radiculopathy   . Chronic back pain   . Kidney stones   . Reactive airway disease     Past Surgical History:  Procedure Laterality Date  . EPIDURAL BLOCK INJECTION    . SHOULDER SURGERY      There were no vitals filed for this visit.       Subjective Assessment - 03/12/17 1056    Subjective pt is a 46 y.o M with CC of chronic neck/ low back pain in 2013 due a fork lift holding a boat fell and landed on his left shoulder. reports N/T that goes down both arms into the finger tips of both hands, that seems to stay the same since onset. reports constant headaches thats worse in the morning and gettiong out of bed feeling like it is a crick in the neck.  Low back pain stays in the middle that shoots to both hips, reports N/T that goes down both both toes.   Limitations Lifting;Standing;Sitting;Walking;House hold activities   How long can you sit comfortably? 5 min   How long can you stand comfortably? 5 min   How long can you walk comfortably? 5 min   Diagnostic tests MRI on the neck / low back    Patient Stated Goals increase flexibility, know what to help self. reduce pain, increase strength   Currently in Pain? Yes   Pain Score 7    Pain Location Neck   Pain Orientation Left;Right   Pain Descriptors / Indicators Aching;Burning;Sore;Pins and needles   Pain Type Chronic pain   Pain Radiating Towards into both feet   Pain Onset More than a month ago   Pain Frequency Intermittent   Aggravating Factors  prolonged standing, sitting, moving around, lifting   Pain Relieving Factors moving around, stretching   Multiple Pain Sites Yes   Pain Score 6   Pain Location Back   Pain Orientation Lower   Pain Descriptors / Indicators Pins and needles;Aching;Sore   Pain Type Chronic pain   Pain Radiating Towards into bil hands/ fingers   Aggravating Factors  prolonged standing, sitting, moving around, lifting   Pain Relieving Factors moving around, stretching,            [facility] PT Assessment - 03/12/17 1057      Assessment   Medical Diagnosis Chronic neck and low back pain    Referring Provider Boykin Nearing MD   Onset Date/Surgical Date --  2013   Hand Dominance Right   Next MD Visit 8 weeks   Prior Therapy yes     Precautions   Precautions None     Restrictions   Weight Bearing Restrictions No     Balance Screen  Has the patient fallen in the past 6 months No   Has the patient had a decrease in activity level because of a fear of falling?  No   Is the patient reluctant to leave their home because of a fear of falling?  No     Home Environment   Living Environment Private residence   Living Arrangements Children   Available Help at Discharge Family;Available PRN/intermittently   Type of Home House   Home Access Stairs to enter;Ramped entrance   Entrance Stairs-Number of Steps 3   Entrance Stairs-Rails None   Home Layout One level   [place] - single point     Prior Function   Level of Independence Independent;Independent with basic ADLs   Vocation On disability  waiting on disablity   Leisure DJ, exericse, raising dogs, martial arts     Cognition   Overall Cognitive Status Within Functional Limits for tasks  assessed     Observation/Other Assessments   Focus on Therapeutic Outcomes (FOTO)  70% limited  predicted 55% limited     Posture/Postural Control   Posture/Postural Control Postural limitations   Postural Limitations Rounded Shoulders;Forward head     ROM / Strength   AROM / PROM / Strength AROM;PROM;Strength     AROM   AROM Assessment Site Cervical;Lumbar   Cervical Flexion 50  end range pain   Cervical Extension 30  end range tightness   Cervical - Right Side Bend 28  ERP   Cervical - Left Side Bend 40  end range tightness   Cervical - Right Rotation 60   Cervical - Left Rotation 50   Lumbar Flexion 80  N/T in the RLE at end range   Lumbar Extension 10  pinching at end range   Lumbar - Right Side Bend 10  end range pain onthe L   Lumbar - Left Side Bend 25     Strength   Strength Assessment Site Shoulder;Hip;Knee   Right/Left Hip Right;Left   Right Hip Flexion 4-/5   Right Hip Extension 3+/5   Right Hip ABduction 3+/5   Right Hip ADduction 3+/5   Left Hip Flexion 4-/5   Left Hip Extension 3+/5   Left Hip ABduction 3+/5   Left Hip ADduction 3+/5   Right/Left Knee Right;Left     Palpation   Spinal mobility hypomobilty    Palpation comment TTP in bil upper tap/ levator scapulae and sub-occipital tightness     Special Tests    Special Tests Cervical   Cervical Tests Spurling's;Dictraction;other     Spurling's   Findings Positive   Side Left     Distraction Test   Findngs Positive     other    Findings Positive   Side --  bil   Comment ULTT                   [facility] Adult PT Treatment/Exercise - 03/12/17 1057      Neck Exercises: Seated   Neck Retraction 10 reps;5 secs  given as HEP     Lumbar Exercises: Stretches   Lower Trunk Rotation --  2 x 10 given as HEP     Lumbar Exercises: Supine   Bent Knee Raise 10 reps  given as HEP   Bent Knee Raise Limitations cues to keep core tightn     Neck Exercises: Stretches   Upper  Trapezius Stretch 2 reps;30 seconds  PT Education - 03/12/17 1152    Education provided Yes   Education Details evaluation findings, POC, goals, HEP with proper form/rationale, anatomy of neck and upper trap"    Person(s) Educated Patient   Methods Explanation;Verbal cues;Handout   Comprehension Verbalized understanding;Verbal cues required          PT Short Term Goals - 03/12/17 1213      PT SHORT TERM GOAL #1   Title "Independent with initial HEP (04/12/2017)   Time 4   Period Weeks   Status New     PT SHORT TERM GOAL #2   Title verbalize and demo proper posture and lifting mechanics to prevent and reduce neck and low back pain (04/12/2016)   Time 4   Period Weeks   Status New     PT SHORT TERM GOAL #3   Title demonstrate reduce muscle spasm in the low back and neck to reduce pain to </=5/10 and promote mobility (04/12/2017)   Time 4   Period Weeks   Status New           PT Long Term Goals - 03/12/17 1216      PT LONG TERM GOAL #1   Title Independent with all HEP given as of last visit (05/07/2017)   Time 8   Period Weeks   Status New     PT LONG TERM GOAL #2   Title pt will increase trunk extension and L side bending by >/= 8 degrees with </= 2/10 pain for ADLs (05/07/2017)   Time 8   Period Weeks   Status New     PT LONG TERM GOAL #3   Title be able to rotate his head and side bending with </= 2/10 pain to promote safety with driving and ADLS (0/07/3817)   Time 8   Period Weeks   Status New     PT LONG TERM GOAL #4   Title he will be able to sit, stand, and walk for >/= 30 min with </= 2/10 for functional endurance required for ADLs (05/07/2017)   Time 8   Period Weeks   Status New     PT LONG TERM GOAL #5   Title increase FOTO score to </= 55% limited to demo improvement in function (05/07/2017)   Time 8   Period Weeks   Status New     Additional Long Term Goals   Additional Long Term Goals Yes     PT LONG TERM GOAL #6   Title  increase bil LE strength to >/= 4/5 to improve QOL, and  promote return to gym routine per pt's personal goal (05/07/2017)   Time 8   Period Weeks   Status New               Plan - 03/12/17 1203    Clinical Impression Statement Mr. Stuckey presents to OPPT as a moderate complexity evauation due to PMHx, continued fluctating pain in the neck/ low back and evaluation findings regarding CC of neck / low back pain from a fork lift carrying a boat tipped landing on his L shoulder in 2013.  He demos ERP with tightness during cervical and trunk AROM with diffiuclty returning to standing posture from trunk flexion requiring UE assist. weakness in bil LE. TTP of bil upper trap/ levator scapulae and sub-occipitals. He demos 3/4 test cluster for cervical radiculopathy indicating moderate-high probabiliy. pt would benefit from physical. He would benefit from physical therapy to decrease neck/ back muscle tightness,  increase LE strength and improve posture and biomechanics to promote maximal function by addressing the deficits listed.    Rehab Potential Good   PT Frequency 2x / week   PT Duration 8 weeks   PT Treatment/Interventions ADLs/Self Care Home Management;Cryotherapy;Electrical Stimulation;Iontophoresis '4mg'$ /ml Dexamethasone;Moist Heat;Ultrasound;Therapeutic activities;Therapeutic exercise;Traction;Manual techniques;Dry needling;Taping;Patient/family education;Passive range of motion;Neuromuscular re-education   PT Next Visit Plan assess/ review HEP, perform special test for lumbar spine, trial cervial traction, DN, hip strengthening   PT Home Exercise Plan chin tuck, upper trap stretching, lower trunk rotation, supine marching   Consulted and Agree with Plan of Care Patient      Patient will benefit from skilled therapeutic intervention in order to improve the following deficits and impairments:  Abnormal gait, Pain, Improper body mechanics, Postural dysfunction, Decreased strength, Increased muscle  spasms, Decreased range of motion, Decreased endurance, Decreased activity tolerance  Visit Diagnosis: Radiculopathy, cervical region - Plan: PT plan of care cert/re-cert  Cramp and spasm - Plan: PT plan of care cert/re-cert  Muscle weakness (generalized) - Plan: PT plan of care cert/re-cert  Abnormal posture - Plan: PT plan of care cert/re-cert     Problem List Patient Active Problem List   Diagnosis Date Noted  . Other fatigue 07/27/2016  . Chronic left shoulder pain 07/22/2016  . Chronic neck pain 11/19/2015  . Chronic low back pain 09/18/2014   [name] PT, DPT, LAT, ATC  03/12/17  12:27 PM      [facility] [address] [place], [place], [address] Phone: [phone]   Fax:  [phone]  Name: Corrion Stirewalt MRN: [id number] Date of Birth: Mar 05, 1971

## 2017-03-12 NOTE — Telephone Encounter (Signed)
PT came to the office, since he went to the [facility] and according to them he need a more specific referral, since he has problem with his rish and amrs, and he need a referral for that, please follow up with PT

## 2017-03-16 ENCOUNTER — Ambulatory Visit: Payer: Self-pay | Admitting: Physical Therapy

## 2017-03-18 ENCOUNTER — Encounter: Payer: Self-pay | Admitting: Family Medicine

## 2017-03-19 ENCOUNTER — Ambulatory Visit: Payer: Self-pay | Admitting: Physical Therapy

## 2017-03-19 DIAGNOSIS — R252 Cramp and spasm: Secondary | ICD-10-CM

## 2017-03-19 DIAGNOSIS — R6889 Other general symptoms and signs: Secondary | ICD-10-CM

## 2017-03-19 DIAGNOSIS — M542 Cervicalgia: Secondary | ICD-10-CM

## 2017-03-19 DIAGNOSIS — R293 Abnormal posture: Secondary | ICD-10-CM

## 2017-03-19 DIAGNOSIS — M6281 Muscle weakness (generalized): Secondary | ICD-10-CM

## 2017-03-19 DIAGNOSIS — R531 Weakness: Secondary | ICD-10-CM

## 2017-03-19 DIAGNOSIS — M5386 Other specified dorsopathies, lumbar region: Secondary | ICD-10-CM

## 2017-03-19 DIAGNOSIS — M5412 Radiculopathy, cervical region: Secondary | ICD-10-CM

## 2017-03-19 DIAGNOSIS — G8929 Other chronic pain: Secondary | ICD-10-CM

## 2017-03-19 DIAGNOSIS — M5382 Other specified dorsopathies, cervical region: Secondary | ICD-10-CM

## 2017-03-19 NOTE — Patient Instructions (Signed)
Bridge    Lift hips, keeping pelvis level. Do _10-20__ times, __2_ times per day.  Straight Leg Raise    Tighten stomach and slowly raise locked right leg  from floor. Repeat __10-15__ times per set. Do _1-2___ sets per session. Do __2__ sessions per day.  Abduction: Clam (Eccentric) - Side-Lying    Lie on side with knees bent. Lift top knee, keeping feet together. Keep trunk steady. Slowly lower for 3-5 seconds. _20__ reps per set, _1-2_ sets per day, __7_ days per week. Repeat on opposite side

## 2017-03-19 NOTE — Therapy (Signed)
[place], [place], [address] Phone: [phone]   Fax:  [phone]  Physical Therapy Treatment  Patient Details  Name: Bradley Colon MRN: [id number] Date of Birth: November 24, 1970 Referring Provider: Boykin Nearing MD  Encounter Date: 03/19/2017      PT End of Session - 03/19/17 1250    Visit Number 2   Number of Visits 17   Date for PT Re-Evaluation 05/07/17   Authorization Type self pay   PT Start Time 0867   PT Stop Time 1330   PT Time Calculation (min) 42 min      Past Medical History:  Diagnosis Date  . Asthma   . Cervical radiculopathy   . Chronic back pain   . Kidney stones   . Reactive airway disease     Past Surgical History:  Procedure Laterality Date  . EPIDURAL BLOCK INJECTION    . SHOULDER SURGERY      There were no vitals filed for this visit.      Subjective Assessment - 03/19/17 1301    Currently in Pain? Yes   Pain Score 5    Pain Location --  upper traps    Pain Descriptors / Indicators Aching;Dull   Pain Type Chronic pain   Pain Radiating Towards into hands                          [facility] Adult PT Treatment/Exercise - 03/19/17 0001      Neck Exercises: Supine   Neck Retraction 10 reps;5 secs   Other Supine Exercise horizontal abduction red x 15      Lumbar Exercises: Stretches   Lower Trunk Rotation --  2 x 10 given as HEP     Lumbar Exercises: Supine   Glut Set 10 reps   Clam 20 reps   Bent Knee Raise 10 reps  given as HEP   Bent Knee Raise Limitations cues to keep core tightn   Bridge 10 reps   Bridge Limitations with glut squeeze, and DF   Straight Leg Raise 10 reps   Straight Leg Raises Limitations abdominal draw in    Other Supine Lumbar Exercises physioball bridge x 10-pt has one at home      Lumbar Exercises: Sidelying   Clam 20 reps     Modalities   Modalities Traction     Traction   Type of Traction Cervical   Min (lbs)  6   Max (lbs) 12   Hold Time 60   Rest Time 10   Time 11                PT Education - 03/19/17 1324    Education provided Yes   Education Details HEP   Person(s) Educated Patient   Methods Explanation;Handout   Comprehension Verbalized understanding          PT Short Term Goals - 03/12/17 1213      PT SHORT TERM GOAL #1   Title "Independent with initial HEP (04/12/2017)   Time 4   Period Weeks   Status New     PT SHORT TERM GOAL #2   Title verbalize and demo proper posture and lifting mechanics to prevent and reduce neck and low back pain (04/12/2016)   Time 4   Period Weeks   Status New     PT SHORT TERM GOAL #3   Title demonstrate reduce muscle spasm in the low back  and neck to reduce pain to </=5/10 and promote mobility (04/12/2017)   Time 4   Period Weeks   Status New           PT Long Term Goals - 03/12/17 1216      PT LONG TERM GOAL #1   Title Independent with all HEP given as of last visit (05/07/2017)   Time 8   Period Weeks   Status New     PT LONG TERM GOAL #2   Title pt will increase trunk extension and L side bending by >/= 8 degrees with </= 2/10 pain for ADLs (05/07/2017)   Time 8   Period Weeks   Status New     PT LONG TERM GOAL #3   Title be able to rotate his head and side bending with </= 2/10 pain to promote safety with driving and ADLS (12/09/6387)   Time 8   Period Weeks   Status New     PT LONG TERM GOAL #4   Title he will be able to sit, stand, and walk for >/= 30 min with </= 2/10 for functional endurance required for ADLs (05/07/2017)   Time 8   Period Weeks   Status New     PT LONG TERM GOAL #5   Title increase FOTO score to </= 55% limited to demo improvement in function (05/07/2017)   Time 8   Period Weeks   Status New     Additional Long Term Goals   Additional Long Term Goals Yes     PT LONG TERM GOAL #6   Title increase bil LE strength to >/= 4/5 to improve QOL, and  promote return to gym routine per pt's  personal goal (05/07/2017)   Time 8   Period Weeks   Status New               Plan - 03/19/17 1318    Clinical Impression Statement Pt arrives c/o right radicular arm pain. We bagan wih reviewing HEP and progressed hip/ core strengthening. Pt has a physioball at home and was shown bridges with the ball. He fatigues quickly with SLRs. Began supine chin tucks however increased radicular pain so discontinued. Trial of mechanical traction to c-spine. Pt c/o headache after 8 minutes. Reduced pull and pt felt better and was able to complete treatment. He reported facial tightness during traction however afterward he felt no radicular pain and felt his head felt lighter.    PT Next Visit Plan assess/ review HEP, perform special test for lumbar spine, assess response to cervial traction, DN, hip strengthening/ update HEP   PT Home Exercise Plan chin tuck, upper trap stretching, lower trunk rotation, supine marching   Consulted and Agree with Plan of Care Patient      Patient will benefit from skilled therapeutic intervention in order to improve the following deficits and impairments:  Abnormal gait, Pain, Improper body mechanics, Postural dysfunction, Decreased strength, Increased muscle spasms, Decreased range of motion, Decreased endurance, Decreased activity tolerance  Visit Diagnosis: Radiculopathy, cervical region  Cramp and spasm  Muscle weakness (generalized)  Abnormal posture  Neck pain, chronic  Decreased ROM of lumbar spine  Decreased ROM of intervertebral discs of cervical spine  Activity intolerance  Generalized weakness     Problem List Patient Active Problem List   Diagnosis Date Noted  . Other fatigue 07/27/2016  . Chronic left shoulder pain 07/22/2016  . Chronic neck pain 11/19/2015  . Chronic low back pain 09/18/2014  Dorene Ar, [place] 03/19/2017, 1:44 PM  [place], [place], [address] Phone: [phone]   Fax:  [phone]  Name: Bradley Colon MRN: [id number] Date of Birth: Nov 19, 1970

## 2017-03-24 ENCOUNTER — Ambulatory Visit: Payer: Self-pay | Admitting: Physical Therapy

## 2017-03-26 ENCOUNTER — Encounter: Payer: Self-pay | Admitting: Physical Therapy

## 2017-03-26 ENCOUNTER — Ambulatory Visit: Payer: Self-pay | Admitting: Physical Therapy

## 2017-03-26 DIAGNOSIS — M5412 Radiculopathy, cervical region: Secondary | ICD-10-CM

## 2017-03-26 DIAGNOSIS — R252 Cramp and spasm: Secondary | ICD-10-CM

## 2017-03-26 DIAGNOSIS — M6281 Muscle weakness (generalized): Secondary | ICD-10-CM

## 2017-03-26 DIAGNOSIS — R293 Abnormal posture: Secondary | ICD-10-CM

## 2017-03-26 NOTE — Therapy (Signed)
Bradley Colon, [place], [address] Phone: [phone]   Fax:  [phone]  Physical Therapy Treatment  Patient Details  Name: Bradley Colon MRN: [id number] Date of Birth: October 17, 1971 Referring Provider: Boykin Nearing MD  Encounter Date: 03/26/2017      PT End of Session - 03/26/17 1101    Visit Number 3   Number of Visits 17   Date for PT Re-Evaluation 05/07/17   PT Start Time 1102   PT Stop Time 1152   PT Time Calculation (min) 50 min   Activity Tolerance Patient tolerated treatment well   Behavior During Therapy [name] for tasks assessed/performed      Past Medical History:  Diagnosis Date  . Asthma   . Cervical radiculopathy   . Chronic back pain   . Kidney stones   . Reactive airway disease     Past Surgical History:  Procedure Laterality Date  . EPIDURAL BLOCK INJECTION    . SHOULDER SURGERY      There were no vitals filed for this visit.      Subjective Assessment - 03/26/17 1100    Subjective "I felt sore after traction which only occured intermittently during the motion, and the low back and hip hurt following the last session"    Currently in Pain? Yes   Pain Score 0-No pain  while on meloxicam   Pain Location Neck   Pain Orientation Right;Left   Pain Descriptors / Indicators Tightness   Pain Type Chronic pain   Pain Onset More than a month ago   Pain Frequency Intermittent   Aggravating Factors  prolonged sitting and standing   Pain Relieving Factors moving around, stretching   Pain Score 0  with meloxicam   Pain Location Back   Pain Orientation Lower   Aggravating Factors  prolonged sitting and leaning onto the R   Pain Relieving Factors moving around, stretching                         [facility] Adult PT Treatment/Exercise - 03/26/17 1122      Modalities   Modalities Moist Heat     Moist Heat Therapy   Number Minutes Moist Heat 10 Minutes   Moist Heat  Location Cervical     Manual Therapy   Manual Therapy Soft tissue mobilization;Myofascial release;Joint mobilization;Taping   Joint Mobilization C3-C7 PA mobs grade 3   Soft tissue mobilization IASTM over bil upper traps/ levator scapulse   Myofascial Release fascial rolling/ stretching on bil upper trap/ levator scapuale   Mulligan R shoulder inhibition taping for upper trap  trial     Neck Exercises: Stretches   Upper Trapezius Stretch 2 reps;30 seconds          Trigger Point Dry Needling - 03/26/17 1118    Consent Given? Yes   Education Handout Provided Yes   Muscles Treated Upper Body Upper trapezius;Levator scapulae   Upper Trapezius Response Twitch reponse elicited;Palpable increased muscle length   Levator Scapulae Response Twitch response elicited;Palpable increased muscle length              PT Education - 03/26/17 1126    Education provided Yes   Education Details anatomy educaiton regarding muscle referral patterns. what TPDN is, benefits, what to expect and after care.   Person(s) Educated Patient   Methods Explanation;Verbal cues   Comprehension Verbalized understanding;Verbal cues required  PT Short Term Goals - 03/12/17 1213      PT SHORT TERM GOAL #1   Title "Independent with initial HEP (04/12/2017)   Time 4   Period Weeks   Status New     PT SHORT TERM GOAL #2   Title verbalize and demo proper posture and lifting mechanics to prevent and reduce neck and low back pain (04/12/2016)   Time 4   Period Weeks   Status New     PT SHORT TERM GOAL #3   Title demonstrate reduce muscle spasm in the low back and neck to reduce pain to </=5/10 and promote mobility (04/12/2017)   Time 4   Period Weeks   Status New           PT Long Term Goals - 03/12/17 1216      PT LONG TERM GOAL #1   Title Independent with all HEP given as of last visit (05/07/2017)   Time 8   Period Weeks   Status New     PT LONG TERM GOAL #2   Title pt will  increase trunk extension and L side bending by >/= 8 degrees with </= 2/10 pain for ADLs (05/07/2017)   Time 8   Period Weeks   Status New     PT LONG TERM GOAL #3   Title be able to rotate his head and side bending with </= 2/10 pain to promote safety with driving and ADLS (07/06/2670)   Time 8   Period Weeks   Status New     PT LONG TERM GOAL #4   Title he will be able to sit, stand, and walk for >/= 30 min with </= 2/10 for functional endurance required for ADLs (05/07/2017)   Time 8   Period Weeks   Status New     PT LONG TERM GOAL #5   Title increase FOTO score to </= 55% limited to demo improvement in function (05/07/2017)   Time 8   Period Weeks   Status New     Additional Long Term Goals   Additional Long Term Goals Yes     PT LONG TERM GOAL #6   Title increase bil LE strength to >/= 4/5 to improve QOL, and  promote return to gym routine per pt's personal goal (05/07/2017)   Time 8   Period Weeks   Status New               Plan - 03/26/17 1127    Clinical Impression Statement pt reported soreness/ tightness in the neck. TPDN was explained and performed on bil upper traps. IASTM was performed on bil upper trap and levator scap which he reported relief of tightness. trialed inhibition taping which he reported decreased tightness.  MHP post session for soreness which he reported relief   PT Next Visit Plan assess response to TPDN and inhibition taping, perform special test for lumbar spine, assess response to cervial traction, DN, hip strengthening/ update HEP   PT Home Exercise Plan chin tuck, upper trap stretching, lower trunk rotation, supine marching   Consulted and Agree with Plan of Care Patient      Patient will benefit from skilled therapeutic intervention in order to improve the following deficits and impairments:  Abnormal gait, Pain, Improper body mechanics, Postural dysfunction, Decreased strength, Increased muscle spasms, Decreased range of motion, Decreased  endurance, Decreased activity tolerance  Visit Diagnosis: Radiculopathy, cervical region  Muscle weakness (generalized)  Abnormal posture  Cramp and spasm  Problem List Patient Active Problem List   Diagnosis Date Noted  . Other fatigue 07/27/2016  . Chronic left shoulder pain 07/22/2016  . Chronic neck pain 11/19/2015  . Chronic low back pain 09/18/2014   [name] PT, DPT, LAT, ATC  03/26/17  11:45 AM      [facility] [address] [place], [place], [address] Phone: [phone]   Fax:  [phone]  Name: Nieves Barberi MRN: [id number] Date of Birth: [birth year]

## 2017-03-31 ENCOUNTER — Ambulatory Visit: Payer: Self-pay | Admitting: Physical Therapy

## 2017-04-02 ENCOUNTER — Ambulatory Visit: Payer: Self-pay | Admitting: Physical Therapy

## 2017-04-06 ENCOUNTER — Encounter: Payer: Self-pay | Admitting: Physical Medicine & Rehabilitation

## 2017-04-07 ENCOUNTER — Ambulatory Visit: Payer: Self-pay | Admitting: Physical Therapy

## 2017-04-09 ENCOUNTER — Encounter: Payer: Self-pay | Admitting: Physical Therapy

## 2017-04-09 ENCOUNTER — Telehealth: Payer: Self-pay | Admitting: Family Medicine

## 2017-04-09 ENCOUNTER — Ambulatory Visit: Payer: Self-pay | Attending: Family Medicine | Admitting: Physical Therapy

## 2017-04-09 DIAGNOSIS — R252 Cramp and spasm: Secondary | ICD-10-CM | POA: Insufficient documentation

## 2017-04-09 DIAGNOSIS — M5412 Radiculopathy, cervical region: Secondary | ICD-10-CM | POA: Insufficient documentation

## 2017-04-09 DIAGNOSIS — R531 Weakness: Secondary | ICD-10-CM | POA: Insufficient documentation

## 2017-04-09 DIAGNOSIS — M6281 Muscle weakness (generalized): Secondary | ICD-10-CM | POA: Insufficient documentation

## 2017-04-09 DIAGNOSIS — R293 Abnormal posture: Secondary | ICD-10-CM | POA: Insufficient documentation

## 2017-04-09 NOTE — Therapy (Signed)
[place], [place], [address] Phone: [phone]   Fax:  [phone]  Physical Therapy Treatment  Patient Details  Name: Bradley Colon MRN: [id number] Date of Birth: April 24, 1971 Referring Provider: Boykin Nearing MD  Encounter Date: 04/09/2017      PT End of Session - 04/09/17 1139    Visit Number 4   Number of Visits 17   Date for PT Re-Evaluation 05/07/17   PT Start Time 1101   PT Stop Time 1144   PT Time Calculation (min) 43 min   Activity Tolerance Patient tolerated treatment well   Behavior During Therapy [facility] for tasks assessed/performed      Past Medical History:  Diagnosis Date  . Asthma   . Cervical radiculopathy   . Chronic back pain   . Kidney stones   . Reactive airway disease     Past Surgical History:  Procedure Laterality Date  . EPIDURAL BLOCK INJECTION    . SHOULDER SURGERY      There were no vitals filed for this visit.      Subjective Assessment - 04/09/17 1104    Subjective "The needle worked, I am just feeling weird like my neck is unstable like I need a brace. I have been having some migraines.    Currently in Pain? Yes   Pain Location Neck   Pain Orientation Right;Left   Pain Descriptors / Indicators Tender   Pain Type Chronic pain   Pain Radiating Towards to the R forearm   Pain Onset More than a month ago   Pain Frequency Intermittent   Aggravating Factors  only when I am active   Pain Relieving Factors moving around, stretching,    Pain Score 6   Pain Location Back   Pain Orientation Lower   Pain Descriptors / Indicators Aching;Sore   Pain Type Chronic pain   Pain Onset More than a month ago   Aggravating Factors  prolonged sitting improperly    Pain Relieving Factors moving around, stretching sitting up straight            [facility] PT Assessment - 04/09/17 1112      AROM   Cervical Flexion 42   Cervical Extension 36   Cervical - Right Side Bend 42    Cervical - Left Side Bend 46   Cervical - Right Rotation 60   Cervical - Left Rotation 63                     [facility] Adult PT Treatment/Exercise - 04/09/17 1131      Neck Exercises: Supine   Cervical Isometrics Flexion;Extension;Right rotation;Left rotation;Right lateral flexion;Left lateral flexion;10 secs;10 reps   Neck Retraction 10 reps;5 secs  x 2 sets     Lumbar Exercises: Stretches   Piriformis Stretch 2 reps;30 seconds     Manual Therapy   Manual Therapy Manual Traction   Manual therapy comments manual trigger point release over bil scalenes, upper trap/ levator scapulae  sub-occipital release x 5 min   Joint Mobilization C3-C7 PA mobs grade 3, bil 1st rib mobs grade 3   Manual Traction 2 x 3 min cervical traction     Neck Exercises: Stretches   Upper Trapezius Stretch 2 reps;30 seconds                PT Education - 04/09/17 1146    Education provided Yes   Education Details time needed to answer multiple questions throughout  session.    Person(s) Educated Patient   Methods Explanation;Verbal cues   Comprehension Verbalized understanding;Verbal cues required          PT Short Term Goals - 03/12/17 1213      PT SHORT TERM GOAL #1   Title "Independent with initial HEP (04/12/2017)   Time 4   Period Weeks   Status New     PT SHORT TERM GOAL #2   Title verbalize and demo proper posture and lifting mechanics to prevent and reduce neck and low back pain (04/12/2016)   Time 4   Period Weeks   Status New     PT SHORT TERM GOAL #3   Title demonstrate reduce muscle spasm in the low back and neck to reduce pain to </=5/10 and promote mobility (04/12/2017)   Time 4   Period Weeks   Status New           PT Long Term Goals - 03/12/17 1216      PT LONG TERM GOAL #1   Title Independent with all HEP given as of last visit (05/07/2017)   Time 8   Period Weeks   Status New     PT LONG TERM GOAL #2   Title pt will increase trunk extension  and L side bending by >/= 8 degrees with </= 2/10 pain for ADLs (05/07/2017)   Time 8   Period Weeks   Status New     PT LONG TERM GOAL #3   Title be able to rotate his head and side bending with </= 2/10 pain to promote safety with driving and ADLS (11/04/4578)   Time 8   Period Weeks   Status New     PT LONG TERM GOAL #4   Title he will be able to sit, stand, and walk for >/= 30 min with </= 2/10 for functional endurance required for ADLs (05/07/2017)   Time 8   Period Weeks   Status New     PT LONG TERM GOAL #5   Title increase FOTO score to </= 55% limited to demo improvement in function (05/07/2017)   Time 8   Period Weeks   Status New     Additional Long Term Goals   Additional Long Term Goals Yes     PT LONG TERM GOAL #6   Title increase bil LE strength to >/= 4/5 to improve QOL, and  promote return to gym routine per pt's personal goal (05/07/2017)   Time 8   Period Weeks   Status New               Plan - 04/09/17 1146    Clinical Impression Statement pt reported decreased pain since the last session but feeling weak in the neck / unstable. utilized manual trigge rpoint release techniques and cervical/ 1st rib mobs to promote mobility which he reported improvement in wrist/ hand symptoms. perofrmed cervical isometrics to promote stability. pt reported decreased pain post session and declined modalities.   PT Next Visit Plan isometrics, piriformis stretching/ core strenghtening assess response to cervial traction, DN??, hip strengthening/ update HEP   PT Home Exercise Plan chin tuck, upper trap stretching, lower trunk rotation, supine marching   Consulted and Agree with Plan of Care Patient      Patient will benefit from skilled therapeutic intervention in order to improve the following deficits and impairments:     Visit Diagnosis: Radiculopathy, cervical region  Muscle weakness (generalized)  Abnormal posture  Cramp  and spasm  Generalized  weakness     Problem List Patient Active Problem List   Diagnosis Date Noted  . Other fatigue 07/27/2016  . Chronic left shoulder pain 07/22/2016  . Chronic neck pain 11/19/2015  . Chronic low back pain 09/18/2014   [name] PT, DPT, LAT, ATC  04/09/17  11:49 AM      [facility] [address] [place], [place], [address] Phone: [phone]   Fax:  [phone]  Name: Bradley Colon MRN: [id number] Date of Birth: 28-Jan-1971

## 2017-04-13 ENCOUNTER — Ambulatory Visit: Payer: Self-pay | Attending: Family Medicine | Admitting: Licensed Clinical Social Worker

## 2017-04-14 ENCOUNTER — Telehealth: Payer: Self-pay | Admitting: *Deleted

## 2017-04-14 NOTE — Telephone Encounter (Signed)
Pt girlfriend called to state patient has pain in chest after taking MOBIC. He feel that chest pain is related to taking this medication. Pt not with her. Will call patient.

## 2017-04-14 NOTE — Telephone Encounter (Addendum)
Pt states MOBIC took away pains that he normally has and was very helpful but took for only a few days because he developed chest pains. He read that this was a side effect. He states he hasn't noticed pain since stopped MOBIC 2 days ago. He states pain would radiate on both sides of chest and he had SOB.    He denies dizziness, BLE swelling, and diaphoresis. He states he has generalized acheyness and fatigue. He feels better but would like to know what he can take without having side effects.   Please advise.

## 2017-04-15 ENCOUNTER — Ambulatory Visit (INDEPENDENT_AMBULATORY_CARE_PROVIDER_SITE_OTHER): Payer: Self-pay | Admitting: Licensed Clinical Social Worker

## 2017-04-15 DIAGNOSIS — Z1389 Encounter for screening for other disorder: Secondary | ICD-10-CM

## 2017-04-15 DIAGNOSIS — Z1331 Encounter for screening for depression: Secondary | ICD-10-CM
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
## 2017-04-16 NOTE — Telephone Encounter (Signed)
Called back to patient Verified name and DOB  He reports the mobic calmed his pain, but he developed chest pain, sharp pains, exacerbated by movement.   He stopped mobic and 2 days later pain came back. He took meloxicam 15 mg last night   Plan: Decrease mobic 7.5 mg (1/2 tablet) Zantac 150 mg 1-2 times daily with food   He is advised he does not need to come in tomorrow for an appointment. He will give me an update in a few days   He needs orange card, he is advised to see financial

## 2017-04-16 NOTE — Telephone Encounter (Signed)
Attempted to call back to patient Got voicemail but mailbox full Unable to leave voicemail

## 2017-04-17 ENCOUNTER — Ambulatory Visit: Payer: Self-pay | Admitting: Family Medicine

## 2017-04-22 ENCOUNTER — Encounter: Payer: Self-pay | Admitting: Sports Medicine

## 2017-04-22 ENCOUNTER — Other Ambulatory Visit: Payer: Self-pay | Admitting: Licensed Clinical Social Worker

## 2017-04-27 ENCOUNTER — Other Ambulatory Visit: Payer: Self-pay | Admitting: Licensed Clinical Social Worker

## 2017-04-28 ENCOUNTER — Ambulatory Visit: Payer: Self-pay | Admitting: Physical Therapy

## 2017-04-29 ENCOUNTER — Ambulatory Visit: Payer: Self-pay | Admitting: Physical Therapy

## 2017-04-29 DIAGNOSIS — M6281 Muscle weakness (generalized): Secondary | ICD-10-CM

## 2017-04-29 DIAGNOSIS — R293 Abnormal posture: Secondary | ICD-10-CM

## 2017-04-29 DIAGNOSIS — R531 Weakness: Secondary | ICD-10-CM

## 2017-04-29 DIAGNOSIS — R252 Cramp and spasm: Secondary | ICD-10-CM

## 2017-04-29 DIAGNOSIS — M5412 Radiculopathy, cervical region: Secondary | ICD-10-CM

## 2017-04-30 ENCOUNTER — Encounter: Payer: Self-pay | Admitting: Physical Therapy

## 2017-04-30 ENCOUNTER — Ambulatory Visit: Payer: Self-pay | Admitting: Physical Therapy

## 2017-04-30 DIAGNOSIS — R293 Abnormal posture: Secondary | ICD-10-CM

## 2017-04-30 DIAGNOSIS — R252 Cramp and spasm: Secondary | ICD-10-CM

## 2017-04-30 DIAGNOSIS — R531 Weakness: Secondary | ICD-10-CM

## 2017-04-30 DIAGNOSIS — M6281 Muscle weakness (generalized): Secondary | ICD-10-CM

## 2017-04-30 DIAGNOSIS — M5412 Radiculopathy, cervical region: Secondary | ICD-10-CM

## 2017-04-30 NOTE — Therapy (Signed)
[place], [place], [address] Phone: [phone]   Fax:  [phone]  Physical Therapy Treatment  Patient Details  Name: Bradley Colon MRN: [id number] Date of Birth: 27-Feb-1971 Referring Provider: Boykin Nearing MD  Encounter Date: 04/30/2017      PT End of Session - 04/30/17 1122    Visit Number 6   Number of Visits 17   Date for PT Re-Evaluation 05/07/17   PT Start Time 1120  pt arrived 20 min late    PT Stop Time 1145   PT Time Calculation (min) 25 min   Activity Tolerance Patient tolerated treatment well   Behavior During Therapy [facility] for tasks assessed/performed      Past Medical History:  Diagnosis Date  . Asthma   . Cervical radiculopathy   . Chronic back pain   . Kidney stones   . Reactive airway disease     Past Surgical History:  Procedure Laterality Date  . EPIDURAL BLOCK INJECTION    . SHOULDER SURGERY      There were no vitals filed for this visit.      Subjective Assessment - 04/30/17 1120    Subjective "The neck is feeling sore today since yesterday but had some improvement with mobility"    Currently in Pain? Yes   Pain Score 0-No pain  took advil today   Pain Score 5  took advil this AM   Pain Location Back   Pain Orientation Lower   Pain Descriptors / Indicators Aching;Sore   Pain Type Chronic pain   Pain Onset More than a month ago   Aggravating Factors  prolonged sitting improperly                         [facility] Adult PT Treatment/Exercise - 04/30/17 1341      Manual Therapy   Joint Mobilization T1-T12 central PA, T1-T12 costovertebral mobs grade 3, L1-L5 PA grade 3                PT Education - 04/30/17 1001    Education provided Yes   Education Details continue with exercises    Person(s) Educated Patient   Methods Explanation;Verbal cues   Comprehension Verbalized understanding;Returned demonstration          PT Short  Term Goals - 04/30/17 1003      PT SHORT TERM GOAL #1   Title "Independent with initial HEP (04/12/2017)   Time 4   Period Weeks   Status On-going     PT SHORT TERM GOAL #2   Title verbalize and demo proper posture and lifting mechanics to prevent and reduce neck and low back pain (04/12/2016)   Time 4   Period Weeks   Status On-going     PT SHORT TERM GOAL #3   Title demonstrate reduce muscle spasm in the low back and neck to reduce pain to </=5/10 and promote mobility (04/12/2017)   Time 4   Period Weeks   Status On-going           PT Long Term Goals - 03/12/17 1216      PT LONG TERM GOAL #1   Title Independent with all HEP given as of last visit (05/07/2017)   Time 8   Period Weeks   Status New     PT LONG TERM GOAL #2   Title pt will increase trunk extension and L side bending by >/= 8 degrees  with </= 2/10 pain for ADLs (05/07/2017)   Time 8   Period Weeks   Status New     PT LONG TERM GOAL #3   Title be able to rotate his head and side bending with </= 2/10 pain to promote safety with driving and ADLS (07/07/8545)   Time 8   Period Weeks   Status New     PT LONG TERM GOAL #4   Title he will be able to sit, stand, and walk for >/= 30 min with </= 2/10 for functional endurance required for ADLs (05/07/2017)   Time 8   Period Weeks   Status New     PT LONG TERM GOAL #5   Title increase FOTO score to </= 55% limited to demo improvement in function (05/07/2017)   Time 8   Period Weeks   Status New     Additional Long Term Goals   Additional Long Term Goals Yes     PT LONG TERM GOAL #6   Title increase bil LE strength to >/= 4/5 to improve QOL, and  promote return to gym routine per pt's personal goal (05/07/2017)   Time 8   Period Weeks   Status New               Plan - 04/30/17 1331    Clinical Impression Statement pt was 20 minutes late today. Due to pt running 20 minutes late today focused on manual for the thoracic/ lumbar spine. Performed thoracic/  lumbar central mobs and costoverbetral mobs. Multiple times throughout treatment pt will report global hypersensitive responses in areas unrelated to the area be treated/ assessed. Post session he reported feeling sore but looser and declined modalities.    PT Next Visit Plan isometrics, piriformis stretching/ core strenghtening, hip strengthening/ update HEP   PT Home Exercise Plan chin tuck, upper trap stretching, lower trunk rotation, supine marching   Consulted and Agree with Plan of Care Patient      Patient will benefit from skilled therapeutic intervention in order to improve the following deficits and impairments:  Abnormal gait, Pain, Improper body mechanics, Postural dysfunction, Decreased strength, Increased muscle spasms, Decreased range of motion, Decreased endurance, Decreased activity tolerance  Visit Diagnosis: Radiculopathy, cervical region  Muscle weakness (generalized)  Abnormal posture  Cramp and spasm  Generalized weakness     Problem List Patient Active Problem List   Diagnosis Date Noted  . Other fatigue 07/27/2016  . Chronic left shoulder pain 07/22/2016  . Chronic neck pain 11/19/2015  . Chronic low back pain 09/18/2014   [name] PT, DPT, LAT, ATC  04/30/17  1:44 PM      [facility] [address] [place], [place], [address] Phone: [phone]   Fax:  [phone]  Name: Bradley Colon MRN: [id number] Date of Birth: 1971-02-23

## 2017-04-30 NOTE — Therapy (Signed)
[place], [place], [address] Phone: [phone]   Fax:  [phone]  Physical Therapy Treatment  Patient Details  Name: Bradley Colon MRN: [id number] Date of Birth: [birth year] Referring Provider: Boykin Nearing MD  Encounter Date: 04/29/2017      PT End of Session - 04/30/17 0958    Visit Number 5   Number of Visits 17   Date for PT Re-Evaluation 05/07/17   Authorization Type self pay   PT Start Time 1505   PT Stop Time 1545   PT Time Calculation (min) 40 min   Activity Tolerance Patient tolerated treatment well   Behavior During Therapy [facility] for tasks assessed/performed      Past Medical History:  Diagnosis Date  . Asthma   . Cervical radiculopathy   . Chronic back pain   . Kidney stones   . Reactive airway disease     Past Surgical History:  Procedure Laterality Date  . EPIDURAL BLOCK INJECTION    . SHOULDER SURGERY      There were no vitals filed for this visit.      Subjective Assessment - 04/30/17 0959    Subjective Patient reports pain that goes from his aneck and shoulder, into his left elbow, and into his right leg. The pain increases with differnet positions but it changes.    Limitations Lifting;Standing;Sitting;Walking;House hold activities   How long can you sit comfortably? 5 min   How long can you stand comfortably? 5 min   How long can you walk comfortably? 5 min   Diagnostic tests MRI on the neck / low back    Currently in Pain? Yes   Pain Score 4    Pain Location Shoulder   Pain Orientation Right   Pain Descriptors / Indicators Tender   Pain Type Chronic pain   Pain Radiating Towards to the right forearm    Pain Onset More than a month ago   Pain Frequency Intermittent   Aggravating Factors  only when i am active    Pain Relieving Factors mvoing around, stretching    Multiple Pain Sites No                         [facility] Adult PT  Treatment/Exercise - 04/30/17 0001      Neck Exercises: Seated   Other Seated Exercise levator stretch 2x20 sec bilateral;      Neck Exercises: Supine   Neck Retraction 10 reps;5 secs  x 2 sets     Manual Therapy   Manual therapy comments manual trigger point release over bil scalenes, upper trap/ levator scapulae  sub-occipital release x 5 min   Soft tissue mobilization IASTM over bil upper traps/ levator scapulse     Neck Exercises: Stretches   Upper Trapezius Stretch 2 reps;30 seconds                PT Education - 04/30/17 1001    Education provided Yes   Education Details continue with exercises    Person(s) Educated Patient   Methods Explanation;Verbal cues   Comprehension Verbalized understanding;Returned demonstration          PT Short Term Goals - 04/30/17 1003      PT SHORT TERM GOAL #1   Title "Independent with initial HEP (04/12/2017)   Time 4   Period Weeks   Status On-going     PT SHORT TERM GOAL #2   Title verbalize  and demo proper posture and lifting mechanics to prevent and reduce neck and low back pain (04/12/2016)   Time 4   Period Weeks   Status On-going     PT SHORT TERM GOAL #3   Title demonstrate reduce muscle spasm in the low back and neck to reduce pain to </=5/10 and promote mobility (04/12/2017)   Time 4   Period Weeks   Status On-going           PT Long Term Goals - 03/12/17 1216      PT LONG TERM GOAL #1   Title Independent with all HEP given as of last visit (05/07/2017)   Time 8   Period Weeks   Status New     PT LONG TERM GOAL #2   Title pt will increase trunk extension and L side bending by >/= 8 degrees with </= 2/10 pain for ADLs (05/07/2017)   Time 8   Period Weeks   Status New     PT LONG TERM GOAL #3   Title be able to rotate his head and side bending with </= 2/10 pain to promote safety with driving and ADLS (12/10/621)   Time 8   Period Weeks   Status New     PT LONG TERM GOAL #4   Title he will be able  to sit, stand, and walk for >/= 30 min with </= 2/10 for functional endurance required for ADLs (05/07/2017)   Time 8   Period Weeks   Status New     PT LONG TERM GOAL #5   Title increase FOTO score to </= 55% limited to demo improvement in function (05/07/2017)   Time 8   Period Weeks   Status New     Additional Long Term Goals   Additional Long Term Goals Yes     PT LONG TERM GOAL #6   Title increase bil LE strength to >/= 4/5 to improve QOL, and  promote return to gym routine per pt's personal goal (05/07/2017)   Time 8   Period Weeks   Status New               Plan - 04/30/17 1002    Clinical Impression Statement Patient reported improved pain after the treatment. He was encouraged to conitnue stretching at home. He had only minor tenderness in his elbow. He reports pain in his right hip. He belives it is coming form his neck but it is likely coming form his back. He may benefit from a review of his back stretches.    Clinical Presentation Stable   Clinical Decision Making Low   Rehab Potential Good   PT Frequency 2x / week   PT Duration 8 weeks   PT Treatment/Interventions ADLs/Self Care Home Management;Cryotherapy;Electrical Stimulation;Iontophoresis 4mg /ml Dexamethasone;Moist Heat;Ultrasound;Therapeutic activities;Therapeutic exercise;Traction;Manual techniques;Dry needling;Taping;Patient/family education;Passive range of motion;Neuromuscular re-education   PT Next Visit Plan isometrics, piriformis stretching/ core strenghtening assess response to cervial traction, DN??, hip strengthening/ update HEP   PT Home Exercise Plan chin tuck, upper trap stretching, lower trunk rotation, supine marching   Consulted and Agree with Plan of Care Patient      Patient will benefit from skilled therapeutic intervention in order to improve the following deficits and impairments:  Abnormal gait, Pain, Improper body mechanics, Postural dysfunction, Decreased strength, Increased muscle  spasms, Decreased range of motion, Decreased endurance, Decreased activity tolerance  Visit Diagnosis: Radiculopathy, cervical region  Muscle weakness (generalized)  Abnormal posture  Cramp and spasm  Generalized weakness     Problem List Patient Active Problem List   Diagnosis Date Noted  . Other fatigue 07/27/2016  . Chronic left shoulder pain 07/22/2016  . Chronic neck pain 11/19/2015  . Chronic low back pain 09/18/2014    Carney Living PT DPT  04/30/2017, 10:05 AM  [facility] [address] [place], [place], [address] Phone: [phone]   Fax:  [phone]  Name: Bradley Colon MRN: [id number] Date of Birth: [birth year]

## 2017-05-01 ENCOUNTER — Other Ambulatory Visit: Payer: Self-pay | Admitting: Licensed Clinical Social Worker

## 2017-05-05 ENCOUNTER — Other Ambulatory Visit: Payer: Self-pay | Admitting: Licensed Clinical Social Worker

## 2017-05-08 ENCOUNTER — Encounter: Payer: Self-pay | Admitting: Physical Therapy

## 2017-05-08 ENCOUNTER — Ambulatory Visit: Payer: Self-pay | Attending: Family Medicine | Admitting: Physical Therapy

## 2017-05-08 DIAGNOSIS — M5412 Radiculopathy, cervical region: Secondary | ICD-10-CM | POA: Insufficient documentation

## 2017-05-08 DIAGNOSIS — M6281 Muscle weakness (generalized): Secondary | ICD-10-CM | POA: Insufficient documentation

## 2017-05-08 DIAGNOSIS — R293 Abnormal posture: Secondary | ICD-10-CM | POA: Insufficient documentation

## 2017-05-11 NOTE — Therapy (Addendum)
[place], [place], [address] Phone: [phone]   Fax:  [phone]  Physical Therapy Treatment / Discharge Summary  Patient Details  Name: Bradley Colon MRN: [id number] Date of Birth: [birth year] Referring Provider: Boykin Nearing MD  Encounter Date: 05/08/2017      PT End of Session - 05/11/17 0807    Visit Number 7   Number of Visits 17   Date for PT Re-Evaluation 05/07/17   Authorization Type self pay   PT Start Time 1015   PT Stop Time 1058   PT Time Calculation (min) 43 min   Activity Tolerance Patient tolerated treatment well   Behavior During Therapy [facility] for tasks assessed/performed      Past Medical History:  Diagnosis Date  . Asthma   . Cervical radiculopathy   . Chronic back pain   . Kidney stones   . Reactive airway disease     Past Surgical History:  Procedure Laterality Date  . EPIDURAL BLOCK INJECTION    . SHOULDER SURGERY      There were no vitals filed for this visit.      Subjective Assessment - 05/11/17 0804    Subjective Patient reports he went swimming 2 days ago and had to spend 2 days in bed. He also cleaned his car and that hurt him. He has tried his soft tissue work.    Limitations Lifting;Standing;Sitting;Walking;House hold activities   How long can you sit comfortably? 5 min   How long can you stand comfortably? 5 min   How long can you walk comfortably? 5 min   Diagnostic tests MRI on the neck / low back    Patient Stated Goals increase flexibility, know what to help self. reduce pain, increase strength   Currently in Pain? Yes   Pain Score 0-No pain   Pain Location Shoulder   Pain Orientation Right   Pain Descriptors / Indicators Tender   Pain Type Chronic pain   Pain Radiating Towards to the right forearm    Pain Onset More than a month ago   Pain Frequency Intermittent   Aggravating Factors  swimming    Pain Relieving Factors moving and stretching                           [facility] Adult PT Treatment/Exercise - 05/11/17 0001      Neck Exercises: Supine   Neck Retraction 10 reps;5 secs  x 2 sets   Other Supine Exercise supine bilateral ER ;  horizontal abduction; bilateral d2 ER; 2x10 each yellow      Lumbar Exercises: Stretches   Single Knee to Chest Stretch Limitations 3x30sec bilateral    Piriformis Stretch 2 reps   Piriformis Stretch Limitations hip ER stretch 3x30 sec hold      Manual Therapy   Manual therapy comments manual trigger point release over bil scalenes, upper trap/ levator scapulae  sub-occipital release x 5 min   Soft tissue mobilization IASTM over bil upper traps/ levator scapulse   Manual Traction 2 x 3 min cervical traction;      Neck Exercises: Stretches   Upper Trapezius Stretch 2 reps;30 seconds                PT Education - 05/11/17 0805    Education provided Yes   Education Details inmportance of increasing strength    Person(s) Educated Patient   Methods Explanation;Demonstration   Comprehension Verbalized  understanding;Returned demonstration;Verbal cues required;Tactile cues required;Need further instruction          PT Short Term Goals - 04/30/17 1003      PT SHORT TERM GOAL #1   Title "Independent with initial HEP (04/12/2017)   Time 4   Period Weeks   Status On-going     PT SHORT TERM GOAL #2   Title verbalize and demo proper posture and lifting mechanics to prevent and reduce neck and low back pain (04/12/2016)   Time 4   Period Weeks   Status On-going     PT SHORT TERM GOAL #3   Title demonstrate reduce muscle spasm in the low back and neck to reduce pain to </=5/10 and promote mobility (04/12/2017)   Time 4   Period Weeks   Status On-going           PT Long Term Goals - 03/12/17 1216      PT LONG TERM GOAL #1   Title Independent with all HEP given as of last visit (05/07/2017)   Time 8   Period Weeks   Status New     PT LONG TERM GOAL #2   Title  pt will increase trunk extension and L side bending by >/= 8 degrees with </= 2/10 pain for ADLs (05/07/2017)   Time 8   Period Weeks   Status New     PT LONG TERM GOAL #3   Title be able to rotate his head and side bending with </= 2/10 pain to promote safety with driving and ADLS (04/03/4430)   Time 8   Period Weeks   Status New     PT LONG TERM GOAL #4   Title he will be able to sit, stand, and walk for >/= 30 min with </= 2/10 for functional endurance required for ADLs (05/07/2017)   Time 8   Period Weeks   Status New     PT LONG TERM GOAL #5   Title increase FOTO score to </= 55% limited to demo improvement in function (05/07/2017)   Time 8   Period Weeks   Status New     Additional Long Term Goals   Additional Long Term Goals Yes     PT LONG TERM GOAL #6   Title increase bil LE strength to >/= 4/5 to improve QOL, and  promote return to gym routine per pt's personal goal (05/07/2017)   Time 8   Period Weeks   Status New               Plan - 05/11/17 5400    Clinical Impression Statement for scapular strengthening. He continues to have more pain when he does activity. He wouuld benefit from furhter strengthening. He continues to have trigger points with paplaption.   Clinical Presentation Stable   Clinical Decision Making Low   Rehab Potential Good   PT Frequency 2x / week   PT Duration 8 weeks   PT Treatment/Interventions ADLs/Self Care Home Management;Cryotherapy;Electrical Stimulation;Iontophoresis 48m/ml Dexamethasone;Moist Heat;Ultrasound;Therapeutic activities;Therapeutic exercise;Traction;Manual techniques;Dry needling;Taping;Patient/family education;Passive range of motion;Neuromuscular re-education   PT Next Visit Plan isometrics, piriformis stretching/ core strenghtening, hip strengthening/ update HEP   PT Home Exercise Plan chin tuck, upper trap stretching, lower trunk rotation, supine marching   Consulted and Agree with Plan of Care Patient      Patient  will benefit from skilled therapeutic intervention in order to improve the following deficits and impairments:  Abnormal gait, Pain, Improper body mechanics, Postural dysfunction, Decreased  strength, Increased muscle spasms, Decreased range of motion, Decreased endurance, Decreased activity tolerance  Visit Diagnosis: Muscle weakness (generalized)  Abnormal posture  Radiculopathy, cervical region     Problem List Patient Active Problem List   Diagnosis Date Noted  . Other fatigue 07/27/2016  . Chronic left shoulder pain 07/22/2016  . Chronic neck pain 11/19/2015  . Chronic low back pain 09/18/2014    Carney Living  PT DPT  05/11/2017, 8:15 AM  [facility] [address] [place], [place], [address] Phone: [phone]   Fax:  [phone]  Name: Bradley Colon MRN: [id number] Date of Birth: [birth year]    PHYSICAL THERAPY DISCHARGE SUMMARY  Visits from Start of Care: 7  Current functional level related to goals / functional outcomes: See goals   Remaining deficits: unknown   Education / Equipment: HEP, posture/ lifting mechanics, theraband,   Plan: Patient agrees to discharge.  Patient goals were not met. Patient is being discharged due to not returning since the last visit.  ?????      Kristoffer Leamon PT, DPT, LAT, ATC  06/02/17  11:28 AM

## 2017-05-12 ENCOUNTER — Ambulatory Visit: Payer: Self-pay | Admitting: Physical Therapy

## 2017-05-14 ENCOUNTER — Encounter: Payer: Self-pay | Attending: Physical Medicine & Rehabilitation

## 2017-05-14 ENCOUNTER — Encounter: Payer: Self-pay | Admitting: Physical Medicine & Rehabilitation

## 2017-05-14 ENCOUNTER — Ambulatory Visit (HOSPITAL_BASED_OUTPATIENT_CLINIC_OR_DEPARTMENT_OTHER): Payer: Self-pay | Admitting: Physical Medicine & Rehabilitation

## 2017-05-14 VITALS — BP 129/88 | HR 65

## 2017-05-14 DIAGNOSIS — G8921 Chronic pain due to trauma: Secondary | ICD-10-CM | POA: Insufficient documentation

## 2017-05-14 DIAGNOSIS — F4310 Post-traumatic stress disorder, unspecified: Secondary | ICD-10-CM | POA: Insufficient documentation

## 2017-05-14 DIAGNOSIS — M47816 Spondylosis without myelopathy or radiculopathy, lumbar region: Secondary | ICD-10-CM | POA: Insufficient documentation

## 2017-05-14 DIAGNOSIS — M5030 Other cervical disc degeneration, unspecified cervical region: Secondary | ICD-10-CM | POA: Insufficient documentation

## 2017-05-14 DIAGNOSIS — M797 Fibromyalgia: Secondary | ICD-10-CM | POA: Insufficient documentation

## 2017-05-14 MED ORDER — DULOXETINE HCL 20 MG PO CPEP: 20.0000 mg | ORAL_CAPSULE | Freq: Every day | ORAL | 1 refills | Status: DC

## 2017-05-14 NOTE — Patient Instructions (Signed)
Fibromyalgia  Lumbar arthritis  Pace your activities  See Neuropsych

## 2017-05-14 NOTE — Progress Notes (Addendum)
Subjective:    Patient ID: Bradley Colon, male    DOB: 30-Jul-1971, 46 y.o.   MRN: [id number]  HPI  Chief complaint widespread migrating body pain.  Patient had a work-related injury in 2013 , with left first rib fracture, left shoulder fracture, as well as a neck injury. Patient was working in [place] at the time, but living in [place]. Then patient had therapy including physical therapy, epidural injection, lower and higher in the neck. Patient also has had recent follow-up MRI of the cervical spine in 2017, as well as of the lumbar spine.  The patient states he injured his lumbar spine as well, but this has not received treatments like the neck.  Patient also has intermittent tingling in his hands and feet. He has pain when he walks on cement surfaces.  Patient is able to dress and bathe himself. He has some difficulty bending forward to put his socks on.  Patient with no bowel or bladder difficulties.  Pain Inventory Average Pain 9 Pain Right Now 6 My pain is intermittent, constant, sharp, burning, dull, stabbing, tingling, aching and wet  In the last 24 hours, has pain interfered with the following? General activity 7 Relation with others 7 Enjoyment of life 10 What TIME of day is your pain at its worst? morning Sleep (in general) Fair  Pain is worse with: walking, bending, sitting, inactivity and standing Pain improves with: heat/ice, medication and injections Relief from Meds: 5  Mobility walk without assistance use a cane ability to climb steps?  yes do you drive?  yes  Function not employed: date last employed 2013 I need assistance with the following:  meal prep, household duties and shopping  Neuro/Psych weakness numbness tingling trouble walking spasms anxiety  Prior Studies Any changes since last visit?  no  Physicians involved in your care Any changes since last visit?  no   Family History  Problem Relation Age of Onset  . Hypertension  Maternal Grandmother   . Diabetes Maternal Grandmother   . Cancer Maternal Grandmother    Social History   Social History  . Marital status: Married    Spouse name: N/A  . Number of children: N/A  . Years of education: N/A   Social History Main Topics  . Smoking status: Light Tobacco Smoker    Types: Cigars  . Smokeless tobacco: Never Used  . Alcohol use 0.0 oz/week     Comment:    . Drug use: Yes    Types: Marijuana     Comment: Last Used November 04 2015  . Sexual activity: Not on file   Other Topics Concern  . Not on file   Social History Narrative   Lives with partner   Has 2 sons with autism   1 daughter with ADD   Past Surgical History:  Procedure Laterality Date  . EPIDURAL BLOCK INJECTION    . SHOULDER SURGERY     Past Medical History:  Diagnosis Date  . Asthma   . Cervical radiculopathy   . Chronic back pain   . Kidney stones   . Reactive airway disease    There were no vitals taken for this visit.  Opioid Risk Score:   Fall Risk Score:  `1  Depression screen PHQ 2/9  Depression screen [facility] 2/9 03/06/2017 08/11/2016 07/22/2016  Decreased Interest 3 0 0  Down, Depressed, Hopeless 1 0 1  PHQ - 2 Score 4 0 1  Altered sleeping 3 - -  Tired,  decreased energy 3 - -  Change in appetite 3 - -  Feeling bad or failure about yourself  1 - -  Trouble concentrating 3 - -  Moving slowly or fidgety/restless 0 - -  Suicidal thoughts 0 - -  PHQ-9 Score 17 - -     Review of Systems  Constitutional: Positive for appetite change.  HENT: Negative.   Eyes: Negative.   Respiratory: Negative.   Cardiovascular: Negative.   Gastrointestinal: Negative.   Endocrine: Negative.   Musculoskeletal: Negative.   Skin: Negative.   Allergic/Immunologic: Negative.   Neurological: Negative.   Hematological: Negative.   Psychiatric/Behavioral: Negative.   All other systems reviewed and are negative.      Objective:   Physical Exam  Constitutional: He is oriented to  person, place, and time.  Cardiovascular: Normal rate and regular rhythm.   Pulmonary/Chest: Effort normal and breath sounds normal. No respiratory distress. He has no wheezes. He has no rales.  Abdominal: Soft. Bowel sounds are normal. He exhibits no distension. There is no tenderness.  Musculoskeletal:  No evidence of joint swelling at the elbows, wrists, knees, ankles, fingers or toes. Full range of motion bilateral upper and lower limbs. Tenderness palpation bilateral upper trapezius, bilateral lateral epicondyles, bilateral medial knee, bilateral cervical paraspinal, bilateral trochanteric and lumbar paraspinal areas.  Full range of motion in the cervical, thoracic spine. Reduced range of motion lumbar extension. Normal range of motion lumbar flexion  Neurological: He is alert and oriented to person, place, and time. He displays no atrophy and no tremor. No cranial nerve deficit or sensory deficit. He exhibits normal muscle tone. Coordination and gait normal.  Reflex Scores:      Tricep reflexes are 2+ on the right side and 2+ on the left side.      Bicep reflexes are 2+ on the right side and 2+ on the left side.      Brachioradialis reflexes are 2+ on the right side and 2+ on the left side.      Patellar reflexes are 2+ on the right side and 2+ on the left side.      Achilles reflexes are 2+ on the right side and 2+ on the left side. Gait without evidence of toe drag or knee instability. Motor strength is 5/5 bilateral deltoid, biceps, triceps, grip, hip flexor, knee extensor, ankle dorsiflexor and plantar flexor. Sensation intact to pinprick, light touch and proprioception bilateral C5, C6, C7, C8, L2, L3, L4, L5-S1 dermatome distribution   Psychiatric: He has a normal mood and affect.  Vitals reviewed.         Assessment & Plan:  1. Widespread body pain, upper lower extremity as well as torso. He has intermittent nonspecific paresthesias of the hands and feet. He has 12 out of  18 fibromyalgia tender points positive. No evidence of joint swelling or rash. Patient likely has fibromyalgia syndrome. Other medical workup may include thyroid studies, sedimentation rate, rheumatoid factor, ANA. We'll defer to primary care on ordering these. We'll start Cymbalta 20 mg per day, but likely will need to titrate upward.  2. 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 Patient has elevated PDQ 9, referral to neuropsychology. Cymbalta should be helpful in this regard  3. Chronic low back pain. Reviewed MRI of the lumbar spine and explained to patient. The findings. There is mild lumbar spondylosis, this correlates with pain during lumbar extension as well as decreased lumbar extension range. Continue PT will need some core strengthening for this. May  benefit from lumbar medial branch blocks  Other considerations may include sacroiliac dysfunction Reassess after another 4-6 weeks  . 4. Chronic cervical pain. Most recent MRI not showing any significant stenosis. He is increased symptomatology, left side which is likely from chronic soft tissue injury to the left shoulder. Mild foraminal stenosis, right C5-6 minimal on the left. Gets good relief with dry needling, discussed use of heat as well as muscle creams. Continue physical therapy for cervical thoracic stabilization.  Do not think narcotic analgesics are indicated in this case.

## 2017-05-14 NOTE — Addendum Note (Signed)
Addended by: Charlett Blake on: 05/14/2017 11:56 AM   Modules accepted: Orders

## 2017-05-15 ENCOUNTER — Ambulatory Visit (INDEPENDENT_AMBULATORY_CARE_PROVIDER_SITE_OTHER): Payer: Self-pay | Admitting: Licensed Clinical Social Worker

## 2017-05-15 DIAGNOSIS — Z1389 Encounter for screening for other disorder: Secondary | ICD-10-CM

## 2017-05-15 DIAGNOSIS — Z1331 Encounter for screening for depression: Secondary | ICD-10-CM
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
## 2017-05-25 ENCOUNTER — Encounter: Payer: Self-pay | Admitting: Psychology

## 2017-05-25 ENCOUNTER — Encounter (HOSPITAL_BASED_OUTPATIENT_CLINIC_OR_DEPARTMENT_OTHER): Payer: Self-pay | Admitting: Psychology

## 2017-05-25 DIAGNOSIS — M797 Fibromyalgia: Secondary | ICD-10-CM

## 2017-05-25 DIAGNOSIS — G8921 Chronic pain due to trauma: Secondary | ICD-10-CM

## 2017-05-25 DIAGNOSIS — M5030 Other cervical disc degeneration, unspecified cervical region: Secondary | ICD-10-CM

## 2017-05-25 DIAGNOSIS — F4312 Post-traumatic stress disorder, chronic: Secondary | ICD-10-CM | POA: Insufficient documentation

## 2017-05-25 DIAGNOSIS — M47816 Spondylosis without myelopathy or radiculopathy, lumbar region: Secondary | ICD-10-CM

## 2017-05-25 DIAGNOSIS — F4310 Post-traumatic stress disorder, unspecified: Secondary | ICD-10-CM
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
## 2017-06-12 ENCOUNTER — Ambulatory Visit: Payer: Self-pay | Admitting: Physical Medicine & Rehabilitation

## 2017-06-30 MED FILL — DULoxetine HCL 20 MG CPEP: 20 | 30 days supply | Qty: 30 | Fill #0

## 2017-07-03 ENCOUNTER — Ambulatory Visit: Payer: Self-pay

## 2017-07-15 ENCOUNTER — Ambulatory Visit: Payer: Self-pay

## 2017-09-22 ENCOUNTER — Ambulatory Visit: Payer: Medicaid Other | Attending: Internal Medicine | Admitting: Internal Medicine

## 2017-09-22 ENCOUNTER — Encounter: Payer: Self-pay | Admitting: Internal Medicine

## 2017-09-22 DIAGNOSIS — M545 Low back pain: Secondary | ICD-10-CM | POA: Diagnosis not present

## 2017-09-22 DIAGNOSIS — M542 Cervicalgia: Secondary | ICD-10-CM | POA: Diagnosis not present

## 2017-09-22 DIAGNOSIS — G8929 Other chronic pain: Secondary | ICD-10-CM | POA: Diagnosis not present

## 2017-09-22 DIAGNOSIS — F329 Major depressive disorder, single episode, unspecified: Secondary | ICD-10-CM | POA: Insufficient documentation

## 2017-09-22 DIAGNOSIS — F17210 Nicotine dependence, cigarettes, uncomplicated: Secondary | ICD-10-CM | POA: Insufficient documentation

## 2017-09-22 DIAGNOSIS — Z79899 Other long term (current) drug therapy: Secondary | ICD-10-CM | POA: Insufficient documentation

## 2017-09-22 DIAGNOSIS — M47816 Spondylosis without myelopathy or radiculopathy, lumbar region: Secondary | ICD-10-CM | POA: Insufficient documentation

## 2017-09-22 MED ORDER — CYCLOBENZAPRINE HCL 5 MG PO TABS: 5.0000 mg | ORAL_TABLET | Freq: Two times a day (BID) | ORAL | 3 refills | Status: DC | PRN

## 2017-09-22 MED ORDER — GABAPENTIN 300 MG PO CAPS: 600.0000 mg | ORAL_CAPSULE | Freq: Every day | ORAL | 5 refills | Status: DC

## 2017-09-22 MED ORDER — MELOXICAM 15 MG PO TABS: 15.0000 mg | ORAL_TABLET | Freq: Every day | ORAL | 6 refills | Status: DC

## 2017-09-22 NOTE — Progress Notes (Signed)
Patient ID: Bradley Colon, male    DOB: Mar 17, 1971  MRN: [id number]  CC: Establish Care   Subjective: Bradley Colon is a 46 y.o. male who presents for His concerns today include:  Chronic neck pain and LBP post work related injury 2013.  On last visit patient was referred to physical therapy and pain management. -He did have physical therapy sessions on his neck. -He saw Dr.Kirsterns who felt that his widespread pain may be due to fibromyalgia.  Pain in the lower back due to lumbar spondylosis.  He recommended that he continue some physical therapy. He also noted positive depression screen and started patient on Cymbalta.  Patient referred to neuropsychologist whom he has seen once. -pt did not find the Cymbalta helpful so he d/c taking -neck always hurts and feels tight. P.T ,dry needle,was helpful but reports they no longer would except his discount. He thinks he had [facility] discount and not sure if it has expired. Waiting for disability to be approved -states he was encouraged to ride a bike by painspecialist. LBP gets worse with continue exercise -some numbness in hands with driving and holding phone -he was taking Meloxicam and  Gabapentin. He prefers to take the latter only at nights because it makes him drowsy if taken during the day  -Percocet worked but he is allergic. Would like to try something similar Cymbalta did not help Patient Active Problem List   Diagnosis Date Noted  . Other fatigue 07/27/2016  . Chronic left shoulder pain 07/22/2016  . Chronic neck pain 11/19/2015  . Chronic low back pain 09/18/2014     Current Outpatient Medications on File Prior to Visit  Medication Sig Dispense Refill  . albuterol (PROVENTIL HFA;VENTOLIN HFA) 108 (90 Base) MCG/ACT inhaler Inhale 1 puff into the lungs every 6 (six) hours as needed for wheezing or shortness of breath. 1 Inhaler 0  . loratadine (CLARITIN) 10 MG tablet Take 10 mg by mouth daily as needed for itching.    . Multiple  Vitamin (MULTIVITAMIN WITH MINERALS) TABS tablet Take 1 tablet by mouth daily.    . Turmeric 500 MG CAPS Take 500 mg by mouth daily.    . Budesonide (PULMICORT IN) Inhale 1 puff into the lungs daily.     No current facility-administered medications on file prior to visit.     Allergies  Allergen Reactions  . Bee Pollen Anaphylaxis and Swelling  . Bee Venom Anaphylaxis and Swelling    Pt has been stung by honey bees since he became a vegetarian at age 82 yrs old with no reaction.  . Other Shortness Of Breath and Itching    Reaction to cut grass, pet dander  . Decadron [Dexamethasone] Itching    Reaction to injection  . Shrimp [Shellfish Allergy] Swelling and Other (See Comments)    Muscle cramps  . Vancomycin Itching    Reaction to IV - pt states no reaction if it is infused slowly  . Wheat Bran Swelling    Some swelling  . Latex Hives and Rash    Reaction to fumes from latex paint    Social History   Socioeconomic History  . Marital status: Married    Spouse name: Not on file  . Number of children: Not on file  . Years of education: Not on file  . Highest education level: Not on file  Social Needs  . Financial resource strain: Not on file  . Food insecurity - worry: Not on file  .  Food insecurity - inability: Not on file  . Transportation needs - medical: Not on file  . Transportation needs - non-medical: Not on file  Occupational History  . Not on file  Tobacco Use  . Smoking status: Light Tobacco Smoker    Types: Cigars  . Smokeless tobacco: Never Used  Substance and Sexual Activity  . Alcohol use: Yes    Alcohol/week: 0.0 oz    Comment:    . Drug use: Yes    Types: Marijuana    Comment: Last Used November 04 2015  . Sexual activity: Not on file  Other Topics Concern  . Not on file  Social History Narrative   Lives with partner   Has 2 sons with autism   1 daughter with ADD    Family History  Problem Relation Age of Onset  . Hypertension Maternal  Grandmother   . Diabetes Maternal Grandmother   . Cancer Maternal Grandmother     Past Surgical History:  Procedure Laterality Date  . EPIDURAL BLOCK INJECTION    . SHOULDER SURGERY      ROS: Review of Systems Neg except as stated above. PHYSICAL EXAM: BP 123/81 (BP Location: Left Arm, Patient Position: Sitting, Cuff Size: Normal)   Pulse 60   Temp 98.2 F (36.8 C) (Oral)   Resp 18   Ht 5\' 8"  (1.727 m)   Wt 193 lb 6.4 oz (87.7 kg)   SpO2 99%   BMI 29.41 kg/m   Physical Exam General appearance - alert, well appearing, and in no distress. Pt answered his cell phone during the visit Mental status - alert, oriented to person, place, and time, normal mood, behavior, speech, dress, motor activity, and thought processes Musculoskeletal - no tenderness on palpation of LS spine. Power LEs 5/5 BL. Grip 5/5, Neck: slowed passive ROM   ASSESSMENT AND PLAN: 1. Chronic neck pain 2. Chronic bilateral low back pain without sciatica -recommend staying active but avoiding activities that cause flare  -advise reapply for [facility] Discount so that he can go back to [facility] -he is not interested in titration of Cymbalta Avoid narcotics - meloxicam (MOBIC) 15 MG tablet; Take 1 tablet (15 mg total) by mouth daily.  Dispense: 30 tablet; Refill: 6 - cyclobenzaprine (FLEXERIL) 5 MG tablet; Take 1 tablet (5 mg total) by mouth 2 (two) times daily as needed for muscle spasms.  Dispense: 30 tablet; Refill: 3 - gabapentin (NEURONTIN) 300 MG capsule; Take 2 capsules (600 mg total) by mouth at bedtime.  Dispense: 60 capsule; Refill: 5  25 mins spent with pt with over 50% of time spent discussing symptoms, advising on treatment and coordinating care. Patient was given the opportunity to ask questions.  Patient verbalized understanding of the plan and was able to repeat key elements of the plan.   No orders of the defined types were placed in this encounter.    Requested Prescriptions   Signed Prescriptions  Disp Refills  . meloxicam (MOBIC) 15 MG tablet 30 tablet 6    Sig: Take 1 tablet (15 mg total) by mouth daily.  . cyclobenzaprine (FLEXERIL) 5 MG tablet 30 tablet 3    Sig: Take 1 tablet (5 mg total) by mouth 2 (two) times daily as needed for muscle spasms.  Marland Kitchen gabapentin (NEURONTIN) 300 MG capsule 60 capsule 5    Sig: Take 2 capsules (600 mg total) by mouth at bedtime.    Return in about 5 months (around 02/20/2018).  Karle Plumber, MD, FACP

## 2017-10-23 MED FILL — MELOXICAM 15 MG TABLET: 15 | 30 days supply | Qty: 30 | Fill #0

## 2017-11-19 MED FILL — CYCLOBENZAPRINE 5 MG TABLET: 5 | 15 days supply | Qty: 30 | Fill #0

## 2017-12-10 ENCOUNTER — Encounter (HOSPITAL_COMMUNITY): Payer: Self-pay | Admitting: *Deleted

## 2017-12-10 ENCOUNTER — Other Ambulatory Visit: Payer: Self-pay

## 2017-12-10 ENCOUNTER — Emergency Department (HOSPITAL_COMMUNITY)
Admission: EM | Admit: 2017-12-10 | Discharge: 2017-12-10 | Disposition: A | Discharge status: Home / Self Care | Payer: Medicaid Other | Attending: Emergency Medicine | Admitting: Emergency Medicine

## 2017-12-10 DIAGNOSIS — Z5321 Procedure and treatment not carried out due to patient leaving prior to being seen by health care provider: Secondary | ICD-10-CM | POA: Diagnosis not present

## 2017-12-10 DIAGNOSIS — M79604 Pain in right leg: Secondary | ICD-10-CM | POA: Diagnosis present

## 2017-12-10 MED FILL — GABAPENTIN 300 MG CAPSULE: 300 | 30 days supply | Qty: 60 | Fill #0

## 2017-12-10 NOTE — ED Triage Notes (Signed)
Pt is here with complaints of recurring injuries C2-C7 and has problems standing and sitting.  Pt had a spasm and reports right buttocks and right leg hurts. He cannot put his right foot down.

## 2017-12-15 ENCOUNTER — Ambulatory Visit: Payer: Self-pay | Admitting: Physical Medicine & Rehabilitation

## 2017-12-15 DIAGNOSIS — M79672 Pain in left foot: Secondary | ICD-10-CM | POA: Insufficient documentation

## 2017-12-15 DIAGNOSIS — G8929 Other chronic pain: Secondary | ICD-10-CM | POA: Insufficient documentation

## 2017-12-15 DIAGNOSIS — M545 Low back pain: Secondary | ICD-10-CM | POA: Insufficient documentation

## 2017-12-15 DIAGNOSIS — S4292XS Fracture of left shoulder girdle, part unspecified, sequela: Secondary | ICD-10-CM | POA: Insufficient documentation

## 2017-12-15 DIAGNOSIS — R202 Paresthesia of skin: Secondary | ICD-10-CM | POA: Insufficient documentation

## 2017-12-15 DIAGNOSIS — F17290 Nicotine dependence, other tobacco product, uncomplicated: Secondary | ICD-10-CM | POA: Insufficient documentation

## 2017-12-15 DIAGNOSIS — M79671 Pain in right foot: Secondary | ICD-10-CM | POA: Insufficient documentation

## 2017-12-15 DIAGNOSIS — X58XXXS Exposure to other specified factors, sequela: Secondary | ICD-10-CM | POA: Insufficient documentation

## 2017-12-15 DIAGNOSIS — M5412 Radiculopathy, cervical region: Secondary | ICD-10-CM | POA: Insufficient documentation

## 2017-12-15 DIAGNOSIS — M79642 Pain in left hand: Secondary | ICD-10-CM | POA: Insufficient documentation

## 2017-12-15 DIAGNOSIS — S2232XS Fracture of one rib, left side, sequela: Secondary | ICD-10-CM | POA: Insufficient documentation

## 2017-12-15 DIAGNOSIS — M542 Cervicalgia: Secondary | ICD-10-CM | POA: Insufficient documentation

## 2017-12-15 DIAGNOSIS — M79605 Pain in left leg: Secondary | ICD-10-CM | POA: Insufficient documentation

## 2017-12-15 DIAGNOSIS — M79641 Pain in right hand: Secondary | ICD-10-CM | POA: Insufficient documentation

## 2017-12-18 ENCOUNTER — Encounter: Payer: Medicaid Other | Attending: Physical Medicine & Rehabilitation

## 2017-12-18 ENCOUNTER — Ambulatory Visit: Payer: Medicaid Other | Admitting: Physical Medicine & Rehabilitation

## 2017-12-18 ENCOUNTER — Encounter: Payer: Self-pay | Admitting: Physical Medicine & Rehabilitation

## 2017-12-18 VITALS — BP 134/90 | HR 68

## 2017-12-18 DIAGNOSIS — G8929 Other chronic pain: Secondary | ICD-10-CM | POA: Diagnosis not present

## 2017-12-18 DIAGNOSIS — F17290 Nicotine dependence, other tobacco product, uncomplicated: Secondary | ICD-10-CM | POA: Diagnosis not present

## 2017-12-18 DIAGNOSIS — X58XXXS Exposure to other specified factors, sequela: Secondary | ICD-10-CM | POA: Diagnosis not present

## 2017-12-18 DIAGNOSIS — R202 Paresthesia of skin: Secondary | ICD-10-CM | POA: Diagnosis not present

## 2017-12-18 DIAGNOSIS — M79671 Pain in right foot: Secondary | ICD-10-CM | POA: Diagnosis not present

## 2017-12-18 DIAGNOSIS — S4292XS Fracture of left shoulder girdle, part unspecified, sequela: Secondary | ICD-10-CM | POA: Diagnosis not present

## 2017-12-18 DIAGNOSIS — R208 Other disturbances of skin sensation: Secondary | ICD-10-CM | POA: Insufficient documentation

## 2017-12-18 DIAGNOSIS — M47816 Spondylosis without myelopathy or radiculopathy, lumbar region: Secondary | ICD-10-CM | POA: Diagnosis not present

## 2017-12-18 DIAGNOSIS — M79641 Pain in right hand: Secondary | ICD-10-CM | POA: Diagnosis not present

## 2017-12-18 DIAGNOSIS — M542 Cervicalgia: Secondary | ICD-10-CM

## 2017-12-18 DIAGNOSIS — M79642 Pain in left hand: Secondary | ICD-10-CM | POA: Diagnosis not present

## 2017-12-18 DIAGNOSIS — M5412 Radiculopathy, cervical region: Secondary | ICD-10-CM | POA: Diagnosis not present

## 2017-12-18 DIAGNOSIS — M79605 Pain in left leg: Secondary | ICD-10-CM | POA: Diagnosis not present

## 2017-12-18 DIAGNOSIS — S2232XS Fracture of one rib, left side, sequela: Secondary | ICD-10-CM | POA: Diagnosis not present

## 2017-12-18 DIAGNOSIS — M797 Fibromyalgia: Secondary | ICD-10-CM

## 2017-12-18 DIAGNOSIS — M545 Low back pain: Secondary | ICD-10-CM | POA: Diagnosis present

## 2017-12-18 DIAGNOSIS — M79672 Pain in left foot: Secondary | ICD-10-CM | POA: Diagnosis present

## 2017-12-18 MED ORDER — DULOXETINE HCL 30 MG PO CPEP: 30.0000 mg | ORAL_CAPSULE | Freq: Every day | ORAL | 1 refills | Status: DC

## 2017-12-18 NOTE — Progress Notes (Signed)
Subjective:    Patient ID: Bradley Colon, male    DOB: 1970-12-31, 47 y.o.   MRN: [id number]  HPI  47 year old male with history of work-related injury in 2013.  Sustained left first rib fracture left shoulder fracture neck injury.  Last MRI of the cervical spine showed some mild degenerative changes C5-C6 but no significant stenosis.  Patient was seen by me on initial evaluation in July 2018.  Primary pain diagnosis was fibromyalgia.  Secondary diagnoses included lumbar spondylosis plus minus sacroiliac disorder.  Recommendations were for neuropsychology evaluation as well as duloxetine 20 mg with plans to titrate upward..  This was performed and ongoing visits were recommended but the patient did not follow through.   The patient states that he lost his insurance at that time and had to reestablish. Patient states that he was homeless for a period of time in the interval but now has an apartment. He does not feel like the duloxetine was helpful for him.  We discussed that he was on a low dose with plans to increase. Seen by his PCP in November 2018 and was started on gabapentin which was helpful for some of his pain complaints but made him a little drowsy.  He is also placed on meloxicam which was also helpful for him.  He states that it wears off in the evening and supplements it with Tylenol.   Chief complaint is widespread body pain.  His pain is migratory affecting the low back mid back neck elbows hands feet and left leg.  Sometimes this pain appears to be activity related and sometimes it occurs when he is trying to sleep.  He gets total relief when he is in the water.  He has had no progressive weakness no loss of bowel or bladder function.  He has been working out in [employer] with [employer], has done recumbent bicycle and the elliptical and we discussed that all of these should be helpful for his condition.  Patient has overall goals of reducing medication usage as well as determining  the reason he has numbness in his hands and in his feet.  Past medical history negative for diabetes or thyroid disease Pain Inventory Average Pain 9 Pain Right Now 9 My pain is intermittent, sharp, burning, dull, stabbing, tingling and aching  In the last 24 hours, has pain interfered with the following? General activity 10 Relation with others 10 Enjoyment of life 10 What TIME of day is your pain at its worst? all Sleep (in general) Poor  Pain is worse with: walking, bending, sitting, inactivity, standing and some activites Pain improves with: . Relief from Meds: 2  Mobility walk without assistance use a cane ability to climb steps?  yes do you drive?  yes  Function disabled: date disabled . I need assistance with the following:  dressing, household duties and shopping  Neuro/Psych weakness numbness tingling trouble walking spasms confusion depression anxiety  Prior Studies Any changes since last visit?  no  Physicians involved in your care Any changes since last visit?  no   Family History  Problem Relation Age of Onset  . Hypertension Maternal Grandmother   . Diabetes Maternal Grandmother   . Cancer Maternal Grandmother    Social History   Socioeconomic History  . Marital status: Married    Spouse name: None  . Number of children: None  . Years of education: None  . Highest education level: None  Social Needs  . Financial resource strain: None  .  Food insecurity - worry: None  . Food insecurity - inability: None  . Transportation needs - medical: None  . Transportation needs - non-medical: None  Occupational History  . None  Tobacco Use  . Smoking status: Light Tobacco Smoker    Types: Cigars  . Smokeless tobacco: Never Used  Substance and Sexual Activity  . Alcohol use: No    Frequency: Never    Comment:    . Drug use: Yes    Types: Marijuana    Comment: Last Used November 04 2015  . Sexual activity: None  Other Topics Concern  .  None  Social History Narrative   Lives with partner   Has 2 sons with autism   1 daughter with ADD   Past Surgical History:  Procedure Laterality Date  . EPIDURAL BLOCK INJECTION    . SHOULDER SURGERY     Past Medical History:  Diagnosis Date  . Asthma   . Cervical radiculopathy   . Chronic back pain   . Kidney stones   . Reactive airway disease    There were no vitals taken for this visit.  Opioid Risk Score:   Fall Risk Score:  `1  Depression screen PHQ 2/9  Depression screen [facility] Fall 2/9 05/14/2017 03/06/2017 08/11/2016 07/22/2016  Decreased Interest 1 3 0 0  Down, Depressed, Hopeless 2 1 0 1  PHQ - 2 Score 3 4 0 1  Altered sleeping 3 3 - -  Tired, decreased energy 3 3 - -  Change in appetite 3 3 - -  Feeling bad or failure about yourself  3 1 - -  Trouble concentrating 3 3 - -  Moving slowly or fidgety/restless 3 0 - -  Suicidal thoughts 0 0 - -  PHQ-9 Score 21 17 - -  Difficult doing work/chores Very difficult - - -     Review of Systems  Constitutional: Positive for appetite change and unexpected weight change.  HENT: Negative.   Eyes: Negative.   Respiratory: Positive for shortness of breath and wheezing.   Cardiovascular: Negative.   Gastrointestinal: Negative.   Endocrine: Negative.   Genitourinary: Negative.   Musculoskeletal: Positive for arthralgias, back pain, gait problem, joint swelling, myalgias and neck pain.  Skin: Positive for rash.  Allergic/Immunologic: Negative.   Hematological: Negative.   Psychiatric/Behavioral: Negative.   All other systems reviewed and are negative.      Objective:   Physical Exam  Constitutional: He is oriented to person, place, and time. He appears well-developed and well-nourished. No distress.  HENT:  Head: Normocephalic and atraumatic.  Eyes: Conjunctivae are normal. Pupils are equal, round, and reactive to light.  Neck: Normal range of motion.  Neurological: He is alert and oriented to person, place, and time.    Patient has diminished pinprick in both hands on the palmar aspect only.  Decreased sensation in the first through fifth digits. Diminished sensation in the fifth toes bilaterally but normal sensation in other toes as well as the ankle and leg area. There is no evidence of atrophy in the hand or foot intrinsic muscles. Motor strength is 5/5 bilateral deltoid, bicep, tricep, grip, hip flexion, knee extensor, ankle dorsiflexor Gait is without evidence of toe drag or knee instability He has normal upper limb and lower limb range of motion Is near normal cervical spine range of motion.  Pain and mild limitation of lumbar extension, lumbar flexion is intact.   Skin: He is not diaphoretic.  Psychiatric: He has a normal  mood and affect.  Nursing note and vitals reviewed.  16/18 fibromyalgia tender points positive including bilateral paracervical ,lumbar gluteus, lateral epicondyle ,vastus medialis       Assessment & Plan:  1.  Chronic widespread body pain which is migratory not accompanied by synovitis or limitation in joint range of motion.  As discussed with the patient the underlying diagnosis is most likely fibromyalgia syndrome.  He does get very good relief when exercising in water.  Continue to encourage his current exercise program which consists of aquatic exercise recumbent bike as well as elliptical for moderate aerobic exercise.  Also light weights. In terms of medication management recommend duloxetine 30 mg/day trial for 1 month if not much better will increase to 60 mg/day he may continue his gabapentin with this however if he gets good relief with the duloxetine may be able to taper the gabapentin. 2.  Paresthesias with upper extremity and left lower extremity symptomatology.  Upper limb he has some symptoms suggestive of carpal tunnel syndrome however in the lower limb is sensation is only abnormal in the S1 dermatomal distribution. His symptoms are not explained by his cervical or  lumbar MRI.  Would recommend EMG/NCV of both upper limbs and left lower limb to further evaluate.  If EMG/MCV is normal I would suspect this is related to paresthesias resulting from fibromyalgia syndrome. 3.  Lumbar spondylosis without myelopathy may benefit from medial branch blocks discussed with patient may consider this.  4.  Cervicalgia without evidence of radiculopathy, do not think MRI is needed

## 2017-12-18 NOTE — Patient Instructions (Signed)
Would do EMG first  Then schedule for Lumbar injections

## 2018-01-19 ENCOUNTER — Encounter: Payer: Medicaid Other | Attending: Physical Medicine & Rehabilitation

## 2018-01-19 ENCOUNTER — Encounter: Payer: Self-pay | Admitting: Physical Medicine & Rehabilitation

## 2018-01-19 ENCOUNTER — Ambulatory Visit: Payer: Medicaid Other | Admitting: Physical Medicine & Rehabilitation

## 2018-01-19 VITALS — BP 128/87 | HR 73 | Resp 14

## 2018-01-19 DIAGNOSIS — M545 Low back pain: Secondary | ICD-10-CM | POA: Diagnosis present

## 2018-01-19 DIAGNOSIS — M79672 Pain in left foot: Secondary | ICD-10-CM | POA: Diagnosis present

## 2018-01-19 DIAGNOSIS — M79642 Pain in left hand: Secondary | ICD-10-CM | POA: Insufficient documentation

## 2018-01-19 DIAGNOSIS — M79671 Pain in right foot: Secondary | ICD-10-CM | POA: Diagnosis not present

## 2018-01-19 DIAGNOSIS — R202 Paresthesia of skin: Secondary | ICD-10-CM | POA: Diagnosis not present

## 2018-01-19 DIAGNOSIS — M542 Cervicalgia: Secondary | ICD-10-CM | POA: Insufficient documentation

## 2018-01-19 DIAGNOSIS — M79641 Pain in right hand: Secondary | ICD-10-CM | POA: Insufficient documentation

## 2018-01-19 DIAGNOSIS — S4292XS Fracture of left shoulder girdle, part unspecified, sequela: Secondary | ICD-10-CM | POA: Diagnosis not present

## 2018-01-19 DIAGNOSIS — M5412 Radiculopathy, cervical region: Secondary | ICD-10-CM | POA: Insufficient documentation

## 2018-01-19 DIAGNOSIS — M79605 Pain in left leg: Secondary | ICD-10-CM

## 2018-01-19 DIAGNOSIS — S2232XS Fracture of one rib, left side, sequela: Secondary | ICD-10-CM | POA: Insufficient documentation

## 2018-01-19 DIAGNOSIS — G8929 Other chronic pain: Secondary | ICD-10-CM | POA: Diagnosis not present

## 2018-01-19 DIAGNOSIS — F17290 Nicotine dependence, other tobacco product, uncomplicated: Secondary | ICD-10-CM | POA: Diagnosis not present

## 2018-01-19 NOTE — Patient Instructions (Signed)
Will discuss the results of your test next visit  Continue exercising!

## 2018-01-19 NOTE — Progress Notes (Signed)
EMG / NCV LLE completed for complaints of LLE throbbing pain.  Normal EDX  Study See scanned copy in media for details

## 2018-02-16 ENCOUNTER — Ambulatory Visit: Payer: Medicaid Other | Admitting: Physical Medicine & Rehabilitation

## 2018-02-18 ENCOUNTER — Encounter: Payer: Self-pay | Admitting: Physical Medicine & Rehabilitation

## 2018-02-18 ENCOUNTER — Ambulatory Visit: Payer: Medicaid Other | Admitting: Physical Medicine & Rehabilitation

## 2018-02-18 ENCOUNTER — Encounter: Payer: Medicaid Other | Attending: Physical Medicine & Rehabilitation

## 2018-02-18 VITALS — BP 129/85 | HR 62 | Resp 14 | Ht 68.0 in | Wt 201.0 lb

## 2018-02-18 DIAGNOSIS — M79672 Pain in left foot: Secondary | ICD-10-CM | POA: Diagnosis present

## 2018-02-18 DIAGNOSIS — S4292XS Fracture of left shoulder girdle, part unspecified, sequela: Secondary | ICD-10-CM | POA: Diagnosis not present

## 2018-02-18 DIAGNOSIS — M79671 Pain in right foot: Secondary | ICD-10-CM | POA: Insufficient documentation

## 2018-02-18 DIAGNOSIS — M79605 Pain in left leg: Secondary | ICD-10-CM | POA: Diagnosis not present

## 2018-02-18 DIAGNOSIS — M797 Fibromyalgia: Secondary | ICD-10-CM | POA: Diagnosis not present

## 2018-02-18 DIAGNOSIS — R202 Paresthesia of skin: Secondary | ICD-10-CM | POA: Insufficient documentation

## 2018-02-18 DIAGNOSIS — M542 Cervicalgia: Secondary | ICD-10-CM | POA: Insufficient documentation

## 2018-02-18 DIAGNOSIS — M79642 Pain in left hand: Secondary | ICD-10-CM | POA: Insufficient documentation

## 2018-02-18 DIAGNOSIS — F17290 Nicotine dependence, other tobacco product, uncomplicated: Secondary | ICD-10-CM | POA: Diagnosis not present

## 2018-02-18 DIAGNOSIS — M545 Low back pain: Secondary | ICD-10-CM | POA: Diagnosis present

## 2018-02-18 DIAGNOSIS — G8929 Other chronic pain: Secondary | ICD-10-CM | POA: Diagnosis not present

## 2018-02-18 DIAGNOSIS — S2232XS Fracture of one rib, left side, sequela: Secondary | ICD-10-CM | POA: Insufficient documentation

## 2018-02-18 DIAGNOSIS — M5412 Radiculopathy, cervical region: Secondary | ICD-10-CM | POA: Insufficient documentation

## 2018-02-18 DIAGNOSIS — M79641 Pain in right hand: Secondary | ICD-10-CM | POA: Diagnosis not present

## 2018-02-18 NOTE — Patient Instructions (Signed)
Start taking cymbalta every night

## 2018-02-18 NOTE — Progress Notes (Signed)
Subjective:    Patient ID: Bradley Colon, male    DOB: 03-07-1971, 47 y.o.   MRN: [id number]  Patient had a work-related injury in 2013 , with left first rib fracture, left shoulder fracture, as well as a neck injury. Patient was working in [place] at the time, but living in [place]. Then patient had therapy including physical therapy, epidural injection, lower and higher in the neck. Patient also has had recent follow-up MRI of the cervical spine in 2017, as well as of the lumbar spine.  The patient states he injured his lumbar spine as well, but this has not received treatments like the neck.  HPI Increasing activity walking dogs, mowing lawn  Takes gabapentin twice a day but only prn - fels like this is causing impotence  Takes cyclobenzaprine prn  Left lower ext pain entire leg, seemed to increase after twisting his Right ankle  Wants to discuss results of EMG LLE  Pain Inventory Average Pain 8 Pain Right Now 8 My pain is constant, sharp, burning, dull, stabbing, tingling, aching and throbbing, pulsating  In the last 24 hours, has pain interfered with the following? General activity 10 Relation with others 10 Enjoyment of life 10 What TIME of day is your pain at its worst? daytime Sleep (in general) Poor  Pain is worse with: walking, bending, sitting, inactivity and standing Pain improves with: rest, heat/ice, medication and injections Relief from Meds: 2  Mobility walk with assistance use a cane how many minutes can you walk? 3-5 ability to climb steps?  yes do you drive?  yes Do you have any goals in this area?  yes  Function disabled: date disabled .  Neuro/Psych weakness numbness tremor tingling trouble walking spasms confusion anxiety  Prior Studies Any changes since last visit?  no  Physicians involved in your care Any changes since last visit?  no   Family History  Problem Relation Age of Onset  . Hypertension Maternal Grandmother   .  Diabetes Maternal Grandmother   . Cancer Maternal Grandmother    Social History   Socioeconomic History  . Marital status: Married    Spouse name: Not on file  . Number of children: Not on file  . Years of education: Not on file  . Highest education level: Not on file  Occupational History  . Not on file  Social Needs  . Financial resource strain: Not on file  . Food insecurity:    Worry: Not on file    Inability: Not on file  . Transportation needs:    Medical: Not on file    Non-medical: Not on file  Tobacco Use  . Smoking status: Light Tobacco Smoker    Types: Cigars  . Smokeless tobacco: Never Used  Substance and Sexual Activity  . Alcohol use: No    Frequency: Never    Comment:    . Drug use: Yes    Types: Marijuana    Comment: Last Used November 04 2015  . Sexual activity: Not on file  Lifestyle  . Physical activity:    Days per week: Not on file    Minutes per session: Not on file  . Stress: Not on file  Relationships  . Social connections:    Talks on phone: Not on file    Gets together: Not on file    Attends religious service: Not on file    Active member of club or organization: Not on file    Attends meetings of  clubs or organizations: Not on file    Relationship status: Not on file  Other Topics Concern  . Not on file  Social History Narrative   Lives with partner   Has 2 sons with autism   1 daughter with ADD   Past Surgical History:  Procedure Laterality Date  . EPIDURAL BLOCK INJECTION    . SHOULDER SURGERY     Past Medical History:  Diagnosis Date  . Asthma   . Cervical radiculopathy   . Chronic back pain   . Kidney stones   . Reactive airway disease    BP 129/85 (BP Location: Left Arm, Patient Position: Sitting, Cuff Size: Normal)   Pulse 62   Resp 14   Ht 5\' 8"  (1.727 m)   Wt 201 lb (91.2 kg)   SpO2 97%   BMI 30.56 kg/m   Opioid Risk Score:   Fall Risk Score:  `1  Depression screen PHQ 2/9  Depression screen [facility] 2/9  05/14/2017 03/06/2017 08/11/2016 07/22/2016  Decreased Interest 1 3 0 0  Down, Depressed, Hopeless 2 1 0 1  PHQ - 2 Score 3 4 0 1  Altered sleeping 3 3 - -  Tired, decreased energy 3 3 - -  Change in appetite 3 3 - -  Feeling bad or failure about yourself  3 1 - -  Trouble concentrating 3 3 - -  Moving slowly or fidgety/restless 3 0 - -  Suicidal thoughts 0 0 - -  PHQ-9 Score 21 17 - -  Difficult doing work/chores Very difficult - - -    Review of Systems  Constitutional: Positive for appetite change.  HENT: Negative.   Eyes: Negative.   Respiratory: Positive for shortness of breath.   Cardiovascular: Negative.   Gastrointestinal: Positive for constipation.  Endocrine: Negative.   Genitourinary: Negative.   Musculoskeletal: Positive for back pain, gait problem, joint swelling, myalgias and neck pain.  Skin: Positive for rash.  Allergic/Immunologic: Negative.   Neurological: Positive for tremors, weakness and numbness.       Tingling  Hematological: Negative.   Psychiatric/Behavioral: Positive for confusion. The patient is nervous/anxious.   All other systems reviewed and are negative.      Objective:   Physical Exam  Constitutional: He is oriented to person, place, and time. He appears well-developed and well-nourished.  HENT:  Head: Normocephalic and atraumatic.  Eyes: Pupils are equal, round, and reactive to light. Conjunctivae and EOM are normal.  Neck: Normal range of motion.  Musculoskeletal: Normal range of motion.  Neurological: He is alert and oriented to person, place, and time.  Psychiatric: He has a normal mood and affect.  Nursing note and vitals reviewed. Motor 5/5 in bilateral deltoid biceps triceps grip, HF KE adf  Pedal pulses normal bilaterally  No hypersensitivity to LT in BLE, no hyperpathia with pp Intact PP sensation in BLE  No swelling or erythema in knees or ankles or feet  Tenerness in upper traps and gluteal region      Assessment & Plan:   1.  Fibromyalgia syndrome with widespread body pain and dysesthesia, EMG LLE negative, pulses normal no evidence of RSD  Would advise starting on cymbalta 30mg  qhs as previously prescribed, pt had elevated PHQ 9 as well Cont gentle aerobic exercise , advise fibromyalgia aquatic program  RTC 6wk with NP

## 2018-02-27 ENCOUNTER — Emergency Department (HOSPITAL_COMMUNITY)
Admission: EM | Admit: 2018-02-27 | Discharge: 2018-02-27 | Disposition: A | Discharge status: Home / Self Care | Payer: Medicaid Other | Attending: Emergency Medicine | Admitting: Emergency Medicine

## 2018-02-27 ENCOUNTER — Emergency Department (HOSPITAL_COMMUNITY): Payer: Medicaid Other

## 2018-02-27 ENCOUNTER — Encounter (HOSPITAL_COMMUNITY): Payer: Self-pay

## 2018-02-27 DIAGNOSIS — F17210 Nicotine dependence, cigarettes, uncomplicated: Secondary | ICD-10-CM | POA: Insufficient documentation

## 2018-02-27 DIAGNOSIS — J45909 Unspecified asthma, uncomplicated: Secondary | ICD-10-CM | POA: Diagnosis present

## 2018-02-27 DIAGNOSIS — Z79899 Other long term (current) drug therapy: Secondary | ICD-10-CM | POA: Insufficient documentation

## 2018-02-27 DIAGNOSIS — J4521 Mild intermittent asthma with (acute) exacerbation: Secondary | ICD-10-CM

## 2018-02-27 DIAGNOSIS — Z91040 Latex allergy status: Secondary | ICD-10-CM | POA: Insufficient documentation

## 2018-02-27 MED ORDER — PREDNISONE 20 MG PO TABS
40.0000 mg | ORAL_TABLET | Freq: Once | ORAL | Status: AC
  Administered 2018-02-27: 40 mg via ORAL
  Filled 2018-02-27: qty 2

## 2018-02-27 MED ORDER — PREDNISONE 20 MG PO TABS: ORAL_TABLET | ORAL | 0 refills | Status: DC

## 2018-02-27 NOTE — ED Triage Notes (Signed)
PT reports hx of reactive airway disease that has been flaring up over the last week with seasonal allergies. PT states he took left over prednisone, albuterol neb, and muscle relaxer with some relief

## 2018-02-27 NOTE — Discharge Instructions (Addendum)
Use your albuterol inhaler 2 puffs every 4 hours or your albuterol nebulizer every 4 hours as needed for shortness of breath or cough.  Return if needed more than every 4 hours.  Call the number on these instructions on Monday, 03/01/2018 to get a primary care physician.  Ask your new primary care physician to help you to stop smoking.  Avoid meloxicam, ibuprofen or the class of drugs known as NSAIDS while taking prednisone as the combination can cause bleeding ulcers.  Take Tylenol for aches while on prednisone

## 2018-02-27 NOTE — ED Provider Notes (Signed)
[facility] Provider Note   CSN: [id number] Arrival date & time: 02/27/18  1542     History   Chief Complaint Chief Complaint  Patient presents with  . Back Pain  . Asthma    HPI Bradley Colon is a 47 y.o. male.  Ends of wheezing and cough due to "reactive airway disease" but started this morning.  He treated himself with 10 mg of prednisone from  and old prescription and with albuterol nebulized treatment this morning with complete relief of breathing he is presently breathing normal.  He denies any fever.  Other associated symptoms include low back pain left side nonradiating which is worse with changing positions or moving.  Improved with remaining still.  Back pain is chronic.Marland Kitchen  He is treated himself with cyclobenzaprine and Mobic with partial relief.  No loss of bladder or bowel control.  No other associated symptoms.  HPI  Past Medical History:  Diagnosis Date  . Asthma   . Cervical radiculopathy   . Chronic back pain   . Kidney stones   . Reactive airway disease     Patient Active Problem List   Diagnosis Date Noted  . Fibromyalgia 12/18/2017  . Dysesthesia affecting both sides of body 12/18/2017  . Spondylosis without myelopathy or radiculopathy, lumbar region 12/18/2017  . Other fatigue 07/27/2016  . Chronic left shoulder pain 07/22/2016  . Chronic neck pain 11/19/2015  . Chronic low back pain 09/18/2014    Past Surgical History:  Procedure Laterality Date  . EPIDURAL BLOCK INJECTION    . SHOULDER SURGERY          Home Medications    Prior to Admission medications   Medication Sig Start Date End Date Taking? Authorizing Provider  albuterol (PROVENTIL HFA;VENTOLIN HFA) 108 (90 Base) MCG/ACT inhaler Inhale 1 puff into the lungs every 6 (six) hours as needed for wheezing or shortness of breath. 11/14/16  Yes Shary Decamp, PA-C  albuterol (PROVENTIL) (2.5 MG/3ML) 0.083% nebulizer solution Take 2.5 mg by nebulization every  6 (six) hours as needed for wheezing or shortness of breath.   Yes [provider]  cyclobenzaprine (FLEXERIL) 5 MG tablet Take 1 tablet (5 mg total) by mouth 2 (two) times daily as needed for muscle spasms. 09/22/17  Yes Ladell Pier, MD  ibuprofen (ADVIL,MOTRIN) 200 MG tablet Take 800 mg by mouth every 6 (six) hours as needed for fever, headache or mild pain.   Yes [provider]  DULoxetine (CYMBALTA) 30 MG capsule Take 1 capsule (30 mg total) by mouth daily. Patient not taking: Reported on 02/27/2018 12/18/17   Charlett Blake, MD  gabapentin (NEURONTIN) 300 MG capsule Take 2 capsules (600 mg total) by mouth at bedtime. Patient not taking: Reported on 02/27/2018 09/22/17   Ladell Pier, MD  predniSONE (DELTASONE) 20 MG tablet 2 tabs po daily x 4 days .  First dose to be taken 02/28/2018 02/27/18   Orlie Dakin, MD  Turmeric 500 MG CAPS Take 500 mg by mouth daily. Patient not taking: Reported on 02/27/2018 11/17/16   Boykin Nearing, MD    Family History Family History  Problem Relation Age of Onset  . Hypertension Maternal Grandmother   . Diabetes Maternal Grandmother   . Cancer Maternal Grandmother     Social History Social History   Tobacco Use  . Smoking status: Light Tobacco Smoker    Types: Cigars  . Smokeless tobacco: Never Used  Substance Use Topics  .  Alcohol use: No    Frequency: Never    Comment:    . Drug use: Yes    Types: Marijuana    Comment: Last Used November 04 2015     Allergies   Bee pollen; Bee venom; Other; Decadron [dexamethasone]; Shrimp [shellfish allergy]; Vancomycin; Wheat bran; and Latex   Review of Systems Review of Systems  Constitutional: Negative.   HENT: Negative.   Respiratory: Positive for cough and shortness of breath.   Cardiovascular: Negative.   Gastrointestinal: Negative.   Musculoskeletal: Positive for back pain.  Skin: Negative.   Neurological: Negative.   Psychiatric/Behavioral: Negative.     All other systems reviewed and are negative.    Physical Exam Updated Vital Signs BP 139/75   Pulse (!) 58   Temp 99.2 F (37.3 C) (Oral)   Resp 18   SpO2 100%   Physical Exam  Constitutional: He is oriented to person, place, and time. He appears well-developed and well-nourished.  HENT:  Head: Normocephalic and atraumatic.  Eyes: Pupils are equal, round, and reactive to light. Conjunctivae are normal.  Neck: Neck supple. No tracheal deviation present. No thyromegaly present.  Cardiovascular: Normal rate and regular rhythm.  No murmur heard. Pulmonary/Chest: Effort normal and breath sounds normal.  Abdominal: Soft. Bowel sounds are normal. He exhibits no distension. There is no tenderness.  Musculoskeletal: Normal range of motion. He exhibits no edema or tenderness.  Paraspinous nontender.  All 4 extremities without redness or tenderness neurovascular intact.  Gait is normal.  Neurological: He is alert and oriented to person, place, and time. Coordination normal.  Skin: Skin is warm and dry. No rash noted.  Psychiatric: He has a normal mood and affect.  Nursing note and vitals reviewed.    ED Treatments / Results  Labs (all labs ordered are listed, but only abnormal results are displayed) Labs Reviewed - No data to display  EKG EKG Interpretation  Date/Time:  Saturday February 27 2018 16:13:00 EDT Ventricular Rate:  64 PR Interval:  168 QRS Duration: 106 QT Interval:  396 QTC Calculation: 408 R Axis:   11 Text Interpretation:  Normal sinus rhythm Normal ECG No significant change since last tracing Confirmed by Orlie Dakin [phone] on 02/27/2018 6:45:14 PM   Radiology Dg Chest 2 View  Result Date: 02/27/2018 CLINICAL DATA:  Short of breath with chest tightness today. EXAM: CHEST - 2 VIEW COMPARISON:  11/14/2016 FINDINGS: The heart size and mediastinal contours are within normal limits. Both lungs are clear. No pleural effusion or pneumothorax. The visualized  skeletal structures are unremarkable. IMPRESSION: No active cardiopulmonary disease. Electronically Signed   By: Lajean Manes M.D.   On: 02/27/2018 17:19   Chest x-ray viewed by me Procedures Procedures (including critical care time)  Medications Ordered in ED Medications  predniSONE (DELTASONE) tablet 40 mg (has no administration in time range)     Initial Impression / Assessment and Plan / ED Course  I have reviewed the triage vital signs and the nursing notes.  Pertinent labs & imaging results that were available during my care of the patient were reviewed by me and considered in my medical decision making (see chart for details).     I counseled patient for 5 minutes on smoking cessation.  Plan prescription prednisone.  He will get 40 mg here orally prior to discharge.  He is instructed to use his albuterol inhaler or nebulizer every 4 hours as needed for shortness of breath or cough.  Return if needed  more than every 4 hours.  Referred primary care  Final Clinical Impressions(s) / ED Diagnoses  Diagnosis #1 exacerbation of asthma Final diagnoses:  None  #2 chronic back pain #3 tobacco abuse  ED Discharge Orders        Ordered    predniSONE (DELTASONE) 20 MG tablet     02/27/18 1851       Orlie Dakin, MD 02/27/18 1904

## 2018-03-26 ENCOUNTER — Encounter: Payer: Self-pay | Admitting: Physical Medicine & Rehabilitation

## 2018-03-26 ENCOUNTER — Ambulatory Visit: Payer: Medicaid Other | Admitting: Physical Medicine & Rehabilitation

## 2018-03-26 ENCOUNTER — Encounter: Payer: Medicaid Other | Attending: Physical Medicine & Rehabilitation

## 2018-03-26 ENCOUNTER — Ambulatory Visit: Payer: Medicaid Other | Admitting: Registered Nurse

## 2018-03-26 VITALS — BP 138/99 | HR 76 | Ht 68.0 in | Wt 199.8 lb

## 2018-03-26 DIAGNOSIS — M542 Cervicalgia: Secondary | ICD-10-CM | POA: Insufficient documentation

## 2018-03-26 DIAGNOSIS — R202 Paresthesia of skin: Secondary | ICD-10-CM | POA: Diagnosis not present

## 2018-03-26 DIAGNOSIS — M79605 Pain in left leg: Secondary | ICD-10-CM | POA: Diagnosis not present

## 2018-03-26 DIAGNOSIS — M79672 Pain in left foot: Secondary | ICD-10-CM | POA: Diagnosis present

## 2018-03-26 DIAGNOSIS — M79642 Pain in left hand: Secondary | ICD-10-CM | POA: Insufficient documentation

## 2018-03-26 DIAGNOSIS — S2232XS Fracture of one rib, left side, sequela: Secondary | ICD-10-CM | POA: Diagnosis not present

## 2018-03-26 DIAGNOSIS — M545 Low back pain: Secondary | ICD-10-CM | POA: Diagnosis present

## 2018-03-26 DIAGNOSIS — S4292XS Fracture of left shoulder girdle, part unspecified, sequela: Secondary | ICD-10-CM | POA: Diagnosis not present

## 2018-03-26 DIAGNOSIS — M79671 Pain in right foot: Secondary | ICD-10-CM | POA: Diagnosis not present

## 2018-03-26 DIAGNOSIS — M5412 Radiculopathy, cervical region: Secondary | ICD-10-CM | POA: Diagnosis not present

## 2018-03-26 DIAGNOSIS — M797 Fibromyalgia: Secondary | ICD-10-CM | POA: Diagnosis not present

## 2018-03-26 DIAGNOSIS — M79641 Pain in right hand: Secondary | ICD-10-CM | POA: Diagnosis not present

## 2018-03-26 DIAGNOSIS — F17290 Nicotine dependence, other tobacco product, uncomplicated: Secondary | ICD-10-CM | POA: Diagnosis not present

## 2018-03-26 DIAGNOSIS — G8929 Other chronic pain: Secondary | ICD-10-CM | POA: Insufficient documentation

## 2018-03-26 NOTE — Progress Notes (Signed)
Subjective:    Patient ID: Bradley Colon, male    DOB: [birth year], 47 y.o.   MRN: [id number]  HPI  47 year old male who had a work-related injury while working in [place] 2016.  He relocated to [place].  He has had chronic pain since his work-related injury.  MRI of the cervical spine and of the lumbar spine showed no significant spinal stenosis.  There were degenerative changes in the lower lumbar facet joints which were mild.  He did have C5-C6 spondylosis degenerative disc with some moderate foraminal stenosis right side.  Does not have any right upper extremity pain. 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  He does have a bottle of prednisone that he takes intermittently we discussed that he should not be doing this.  Cymbalta started proximally 57-year-old ago at a 20 mill grams dosing this was not helpful.  He took it upon himself to increase his dose to 40 mg in the morning and 20 evening in became sedated.  We discussed that dosing changes need to be done on a gradual basis.  Tylenol and ibuprofen use over-the-counter with partial relief. Patient states that he is working out stretching and doing yoga Pain Inventory Average Pain 5 Pain Right Now 9 My pain is constant, sharp, burning, dull, stabbing, tingling and aching  In the last 24 hours, has pain interfered with the following? General activity 8 Relation with others 8 Enjoyment of life 8 What TIME of day is your pain at its worst? varies Sleep (in general) Poor  Pain is worse with: walking, bending, sitting, inactivity, standing and some activites Pain improves with: rest, heat/ice, therapy/exercise and medication Relief from Meds: 0  Mobility walk without assistance ability to climb steps?  yes do you drive?  yes  Function disabled: date disabled 2013  Neuro/Psych weakness numbness tremor tingling trouble walking spasms  Prior Studies Any changes since last  visit?  no  Physicians involved in your care Any changes since last visit?  no   Family History  Problem Relation Age of Onset  . Hypertension Maternal Grandmother   . Diabetes Maternal Grandmother   . Cancer Maternal Grandmother    Social History   Socioeconomic History  . Marital status: Married    Spouse name: Not on file  . Number of children: Not on file  . Years of education: Not on file  . Highest education level: Not on file  Occupational History  . Not on file  Social Needs  . Financial resource strain: Not on file  . Food insecurity:    Worry: Not on file    Inability: Not on file  . Transportation needs:    Medical: Not on file    Non-medical: Not on file  Tobacco Use  . Smoking status: Light Tobacco Smoker    Types: Cigars  . Smokeless tobacco: Never Used  Substance and Sexual Activity  . Alcohol use: No    Frequency: Never    Comment:    . Drug use: Yes    Types: Marijuana    Comment: Last Used November 04 2015  . Sexual activity: Not on file  Lifestyle  . Physical activity:    Days per week: Not on file    Minutes per session: Not on file  . Stress: Not on file  Relationships  . Social connections:    Talks on phone: Not on file    Gets together: Not on  file    Attends religious service: Not on file    Active member of club or organization: Not on file    Attends meetings of clubs or organizations: Not on file    Relationship status: Not on file  Other Topics Concern  . Not on file  Social History Narrative   Lives with partner   Has 2 sons with autism   1 daughter with ADD   Past Surgical History:  Procedure Laterality Date  . EPIDURAL BLOCK INJECTION    . SHOULDER SURGERY     Past Medical History:  Diagnosis Date  . Asthma   . Cervical radiculopathy   . Chronic back pain   . Kidney stones   . Reactive airway disease    There were no vitals taken for this visit.  Opioid Risk Score:   Fall Risk Score:  `1  Depression screen  PHQ 2/9  Depression screen [facility] 2/9 05/14/2017 03/06/2017 08/11/2016 07/22/2016  Decreased Interest 1 3 0 0  Down, Depressed, Hopeless 2 1 0 1  PHQ - 2 Score 3 4 0 1  Altered sleeping 3 3 - -  Tired, decreased energy 3 3 - -  Change in appetite 3 3 - -  Feeling bad or failure about yourself  3 1 - -  Trouble concentrating 3 3 - -  Moving slowly or fidgety/restless 3 0 - -  Suicidal thoughts 0 0 - -  PHQ-9 Score 21 17 - -  Difficult doing work/chores Very difficult - - -     Review of Systems  Constitutional: Negative.   HENT: Negative.   Eyes: Negative.   Respiratory: Negative.   Cardiovascular: Negative.   Gastrointestinal: Positive for constipation.  Endocrine: Negative.   Genitourinary: Negative.   Musculoskeletal: Positive for arthralgias, back pain and myalgias.  Skin: Negative.   Allergic/Immunologic: Negative.   Neurological: Positive for weakness and numbness.  Hematological: Negative.   Psychiatric/Behavioral: Negative.   All other systems reviewed and are negative.      Objective:   Physical Exam  Constitutional: He is oriented to person, place, and time. He appears well-developed and well-nourished. No distress.  HENT:  Head: Normocephalic and atraumatic.  Musculoskeletal:  Patient is tenderness palpation in the lumbar paraspinals even with light palpation of the gluteal musculature as well as over the greater trochanters of the hip. He is full range of motion in the shoulders elbows wrists hands as well as hips knees ankles and feet.  His lumbar range of motion is 75% flexion extension lateral bending and rotation.  Cervical range of motion is full  Neurological: He is alert and oriented to person, place, and time.  Skin: He is not diaphoretic.  Psychiatric: He has a normal mood and affect.  Nursing note and vitals reviewed.         Assessment & Plan:  1.  Chronic posttraumatic pain he is widespread body pain which is most consistent with fibromyalgia  syndrome.  He did get some good pain relief with higher dose Cymbalta however he had some sedation with this.  I do believe he escalated his dose too quickly and we discussed that we should start with the 30 mg dose taken once a day for a month and then if not much better we may go up to the 40 mg. He will follow-up in a month 2.  Hip pain history of some type of congenital hip disorder which required bracing, question coxa valga.  Will check hip  x-rays.

## 2018-03-26 NOTE — Patient Instructions (Signed)
Get Cymbalta 30mg  per day

## 2018-04-23 ENCOUNTER — Ambulatory Visit: Payer: Medicaid Other | Admitting: Physical Medicine & Rehabilitation

## 2018-04-23 ENCOUNTER — Encounter: Payer: Medicaid Other | Attending: Physical Medicine & Rehabilitation

## 2018-04-23 DIAGNOSIS — M5412 Radiculopathy, cervical region: Secondary | ICD-10-CM | POA: Insufficient documentation

## 2018-04-23 DIAGNOSIS — R202 Paresthesia of skin: Secondary | ICD-10-CM | POA: Insufficient documentation

## 2018-04-23 DIAGNOSIS — M545 Low back pain: Secondary | ICD-10-CM | POA: Insufficient documentation

## 2018-04-23 DIAGNOSIS — M79672 Pain in left foot: Secondary | ICD-10-CM | POA: Insufficient documentation

## 2018-04-23 DIAGNOSIS — S2232XS Fracture of one rib, left side, sequela: Secondary | ICD-10-CM | POA: Insufficient documentation

## 2018-04-23 DIAGNOSIS — M79605 Pain in left leg: Secondary | ICD-10-CM | POA: Insufficient documentation

## 2018-04-23 DIAGNOSIS — S4292XS Fracture of left shoulder girdle, part unspecified, sequela: Secondary | ICD-10-CM | POA: Insufficient documentation

## 2018-04-23 DIAGNOSIS — M79671 Pain in right foot: Secondary | ICD-10-CM | POA: Insufficient documentation

## 2018-04-23 DIAGNOSIS — M79641 Pain in right hand: Secondary | ICD-10-CM | POA: Insufficient documentation

## 2018-04-23 DIAGNOSIS — M79642 Pain in left hand: Secondary | ICD-10-CM | POA: Insufficient documentation

## 2018-04-23 DIAGNOSIS — G8929 Other chronic pain: Secondary | ICD-10-CM | POA: Insufficient documentation

## 2018-04-23 DIAGNOSIS — F17290 Nicotine dependence, other tobacco product, uncomplicated: Secondary | ICD-10-CM | POA: Insufficient documentation

## 2018-04-23 DIAGNOSIS — M542 Cervicalgia: Secondary | ICD-10-CM | POA: Insufficient documentation

## 2018-05-11 ENCOUNTER — Ambulatory Visit: Payer: Medicaid Other | Admitting: Physical Medicine & Rehabilitation

## 2018-06-15 ENCOUNTER — Encounter: Payer: Medicaid Other | Attending: Physical Medicine & Rehabilitation

## 2018-06-15 ENCOUNTER — Ambulatory Visit: Payer: Medicaid Other | Admitting: Physical Medicine & Rehabilitation

## 2018-06-15 ENCOUNTER — Encounter: Payer: Self-pay | Admitting: Physical Medicine & Rehabilitation

## 2018-06-15 VITALS — BP 138/93 | HR 69 | Ht 68.0 in | Wt 197.6 lb

## 2018-06-15 DIAGNOSIS — M5412 Radiculopathy, cervical region: Secondary | ICD-10-CM | POA: Insufficient documentation

## 2018-06-15 DIAGNOSIS — M79605 Pain in left leg: Secondary | ICD-10-CM | POA: Insufficient documentation

## 2018-06-15 DIAGNOSIS — R202 Paresthesia of skin: Secondary | ICD-10-CM | POA: Insufficient documentation

## 2018-06-15 DIAGNOSIS — M79642 Pain in left hand: Secondary | ICD-10-CM | POA: Insufficient documentation

## 2018-06-15 DIAGNOSIS — M79671 Pain in right foot: Secondary | ICD-10-CM | POA: Diagnosis not present

## 2018-06-15 DIAGNOSIS — M545 Low back pain: Secondary | ICD-10-CM | POA: Insufficient documentation

## 2018-06-15 DIAGNOSIS — M79641 Pain in right hand: Secondary | ICD-10-CM | POA: Diagnosis not present

## 2018-06-15 DIAGNOSIS — M542 Cervicalgia: Secondary | ICD-10-CM | POA: Insufficient documentation

## 2018-06-15 DIAGNOSIS — M47816 Spondylosis without myelopathy or radiculopathy, lumbar region: Secondary | ICD-10-CM

## 2018-06-15 DIAGNOSIS — S4292XS Fracture of left shoulder girdle, part unspecified, sequela: Secondary | ICD-10-CM | POA: Insufficient documentation

## 2018-06-15 DIAGNOSIS — S2232XS Fracture of one rib, left side, sequela: Secondary | ICD-10-CM | POA: Insufficient documentation

## 2018-06-15 DIAGNOSIS — F17290 Nicotine dependence, other tobacco product, uncomplicated: Secondary | ICD-10-CM | POA: Diagnosis not present

## 2018-06-15 DIAGNOSIS — G8929 Other chronic pain: Secondary | ICD-10-CM | POA: Diagnosis not present

## 2018-06-15 DIAGNOSIS — M79672 Pain in left foot: Secondary | ICD-10-CM | POA: Diagnosis present

## 2018-06-15 MED ORDER — BETAMETHASONE SOD PHOS & ACET 6 (3-3) MG/ML IJ SUSP
6.0000 mg | Freq: Once | INTRAMUSCULAR | Status: AC
  Administered 2018-06-15: 6 mg via INTRAMUSCULAR

## 2018-06-15 NOTE — Patient Instructions (Signed)
Myofascial Pain Syndrome and Fibromyalgia Myofascial pain syndrome and fibromyalgia are both pain disorders. This pain may be felt mainly in your muscles.  Myofascial pain syndrome: ? Always has trigger points or tender points in the muscle that will cause pain when pressed. The pain may come and go. ? Usually affects your neck, upper back, and shoulder areas. The pain often radiates into your arms and hands.  Fibromyalgia: ? Has muscle pains and tenderness that come and go. ? Is often associated with fatigue and sleep disturbances. ? Has trigger points. ? Tends to be long-lasting (chronic), but is not life-threatening.  Fibromyalgia and myofascial pain are not the same. However, they often occur together. If you have both conditions, each can make the other worse. Both are common and can cause enough pain and fatigue to make day-to-day activities difficult. What are the causes? The exact causes of fibromyalgia and myofascial pain are not known. People with certain gene types may be more likely to develop fibromyalgia. Some factors can be triggers for both conditions, such as:  Spine disorders.  Arthritis.  Severe injury (trauma) and other physical stressors.  Being under a lot of stress.  A medical illness.  What are the signs or symptoms? Fibromyalgia The main symptom of fibromyalgia is widespread pain and tenderness in your muscles. This can vary over time. Pain is sometimes described as stabbing, shooting, or burning. You may have tingling or numbness, too. You may also have sleep problems and fatigue. You may wake up feeling tired and groggy (fibro fog). Other symptoms may include:  Bowel and bladder problems.  Headaches.  Visual problems.  Problems with odors and noises.  Depression or mood changes.  Painful menstrual periods (dysmenorrhea).  Dry skin or eyes.  Myofascial pain syndrome Symptoms of myofascial pain syndrome include:  Tight, ropy bands of  muscle.  Uncomfortable sensations in muscular areas, such as: ? Aching. ? Cramping. ? Burning. ? Numbness. ? Tingling. ? Muscle weakness.  Trouble moving certain muscles freely (range of motion).  How is this diagnosed? There are no specific tests to diagnose fibromyalgia or myofascial pain syndrome. Both can be hard to diagnose because their symptoms are common in many other conditions. Your health care provider may suspect one or both of these conditions based on your symptoms and medical history. Your health care provider will also do a physical exam. The key to diagnosing fibromyalgia is having pain, fatigue, and other symptoms for more than three months that cannot be explained by another condition. The key to diagnosing myofascial pain syndrome is finding trigger points in muscles that are tender and cause pain elsewhere in your body (referred pain). How is this treated? Treating fibromyalgia and myofascial pain often requires a team of health care providers. This usually starts with your primary provider and a physical therapist. You may also find it helpful to work with alternative health care providers, such as massage therapists or acupuncturists. Treatment for fibromyalgia may include medicines. This may include nonsteroidal anti-inflammatory drugs (NSAIDs), along with other medicines. Treatment for myofascial pain may also include:  NSAIDs.  Cooling and stretching of muscles.  Trigger point injections.  Sound wave (ultrasound) treatments to stimulate muscles.  Follow these instructions at home:  Take medicines only as directed by your health care provider.  Exercise as directed by your health care provider or physical therapist.  Try to avoid stressful situations.  Practice relaxation techniques to control your stress. You may want to try: ? Biofeedback. ? Visual   imagery. ? Hypnosis. ? Muscle relaxation. ? Yoga. ? Meditation.  Talk to your health care provider  about alternative treatments, such as acupuncture or massage treatment.  Maintain a healthy lifestyle. This includes eating a healthy diet and getting enough sleep.  Consider joining a support group.  Do not do activities that stress or strain your muscles. That includes repetitive motions and heavy lifting. Where to find more information:  National Fibromyalgia Association: www.fmaware.org  Arthritis Foundation: www.arthritis.org  American Chronic Pain Association: www.theacpa.org/condition/myofascial-pain Contact a health care provider if:  You have new symptoms.  Your symptoms get worse.  You have side effects from your medicines.  You have trouble sleeping.  Your condition is causing depression or anxiety. This information is not intended to replace advice given to you by your health care provider. Make sure you discuss any questions you have with your health care provider. Document Released: 10/20/2005 Document Revised: 03/27/2016 Document Reviewed: 07/26/2014 Elsevier Interactive Patient Education  2018 Elsevier Inc.  

## 2018-06-15 NOTE — Progress Notes (Addendum)
Subjective:    Patient ID: Bradley Colon, male    DOB: [birth year], 47 y.o.   MRN: [id number] Patient had a work-related injury in 2013 , with left first rib fracture, left shoulder fracture, as well as a neck injury. Patient was working in [place] at the time, but living in [place]. Then patient had therapy including physical therapy, epidural injection, lower and higher in the neck. Patient also has had recent follow-up MRI of the cervical spine in 2017, as well as of the lumbar spine. HPI  CC Widespread bodypain that is intermittent and migratory No joint swelling but hands feel stiff at times Groin injury after lifting, improved after 2 days of  Prednisone Swimming for exercise  Still takes ibuprofen several times a day which irritates stomach, has had IM steroid per PCP in 2017 which helped reduce NSAID use for a couple months  LLE "nerve pain" but no weakness, mainly when squatting Pain Inventory Average Pain 7 Pain Right Now 7 My pain is intermittent, sharp, dull and aching  In the last 24 hours, has pain interfered with the following? General activity 3 Relation with others 3 Enjoyment of life 3 What TIME of day is your pain at its worst? morning Sleep (in general) Poor  Pain is worse with: walking, bending, sitting, inactivity, standing and some activites Pain improves with: medication and pool, hottub Relief from Meds: na  Mobility walk without assistance  Function Do you have any goals in this area?  yes  Neuro/Psych No problems in this area  Prior Studies Any changes since last visit?  no  Physicians involved in your care Any changes since last visit?  no   Family History  Problem Relation Age of Onset  . Hypertension Maternal Grandmother   . Diabetes Maternal Grandmother   . Cancer Maternal Grandmother    Social History   Socioeconomic History  . Marital status: Married    Spouse name: Not on file  . Number of children: Not on file  . Years  of education: Not on file  . Highest education level: Not on file  Occupational History  . Not on file  Social Needs  . Financial resource strain: Not on file  . Food insecurity:    Worry: Not on file    Inability: Not on file  . Transportation needs:    Medical: Not on file    Non-medical: Not on file  Tobacco Use  . Smoking status: Light Tobacco Smoker    Types: Cigars  . Smokeless tobacco: Never Used  Substance and Sexual Activity  . Alcohol use: No    Frequency: Never    Comment:    . Drug use: Yes    Types: Marijuana    Comment: Last Used November 04 2015  . Sexual activity: Not on file  Lifestyle  . Physical activity:    Days per week: Not on file    Minutes per session: Not on file  . Stress: Not on file  Relationships  . Social connections:    Talks on phone: Not on file    Gets together: Not on file    Attends religious service: Not on file    Active member of club or organization: Not on file    Attends meetings of clubs or organizations: Not on file    Relationship status: Not on file  Other Topics Concern  . Not on file  Social History Narrative   Lives with partner   Has  2 sons with autism   1 daughter with ADD   Past Surgical History:  Procedure Laterality Date  . EPIDURAL BLOCK INJECTION    . SHOULDER SURGERY     Past Medical History:  Diagnosis Date  . Asthma   . Cervical radiculopathy   . Chronic back pain   . Kidney stones   . Reactive airway disease    BP (!) 138/93   Pulse 69   Ht 5\' 8"  (1.727 m)   Wt 197 lb 9.6 oz (89.6 kg)   SpO2 97%   BMI 30.04 kg/m   Opioid Risk Score:   Fall Risk Score:  `1  Depression screen PHQ 2/9  Depression screen [facility] 2/9 05/14/2017 03/06/2017 08/11/2016 07/22/2016  Decreased Interest 1 3 0 0  Down, Depressed, Hopeless 2 1 0 1  PHQ - 2 Score 3 4 0 1  Altered sleeping 3 3 - -  Tired, decreased energy 3 3 - -  Change in appetite 3 3 - -  Feeling bad or failure about yourself  3 1 - -  Trouble  concentrating 3 3 - -  Moving slowly or fidgety/restless 3 0 - -  Suicidal thoughts 0 0 - -  PHQ-9 Score 21 17 - -  Difficult doing work/chores Very difficult - - -     Review of Systems  Constitutional: Negative.   HENT: Negative.   Eyes: Negative.   Respiratory: Negative.   Cardiovascular: Negative.   Gastrointestinal: Negative.   Endocrine: Negative.   Genitourinary: Positive for testicular pain.  Musculoskeletal: Positive for arthralgias, back pain and myalgias.  Skin: Negative.   Allergic/Immunologic: Negative.   Neurological: Negative.   Hematological: Negative.   Psychiatric/Behavioral: Negative.   All other systems reviewed and are negative.      Objective:   Physical Exam  Constitutional: He is oriented to person, place, and time. He appears well-developed and well-nourished. No distress.  HENT:  Head: Normocephalic and atraumatic.  Eyes: Pupils are equal, round, and reactive to light. EOM are normal.  Neck: Normal range of motion.  Musculoskeletal:       Right shoulder: Normal.       Left shoulder: Normal.       Right elbow: Normal.      Left elbow: Normal.       Right wrist: Normal.       Left wrist: Normal.       Left hand: Normal.  Neurological: He is alert and oriented to person, place, and time. He has normal strength. He displays no atrophy and no tremor. Coordination and gait normal.  Motor 5/5 in BUE and BLE  Skin: Skin is warm and dry. He is not diaphoretic.  Psychiatric: His mood appears anxious. His affect is labile. He is hyperactive.  emotional lability when describing his inability to exercise like he used to              Assessment & Plan:  1.  Fibromyalgia syndrome tolerates gabapentin at night but has some hangover effect Cyclobenzaprine makes him hyper Cymbalta at 40mg  makes him "crazy loopy" Narcotics cause itching  Discussed trial of Lyrica which he declines due to fear of potential side effects  2.  LLE pain, "numbness"  but normal neuro exam.  EMG neg, do not think this is radiculitis, may have pseudosciatica from SI jt issue or parasthesia from #1  3.  Lumbar spondylosis suspect underlying facet pain- pt reluctant about spinal injection although medial branch blocks are  low risk and not epidural- will give celestone 6mg  IM today, pt states this has helped him reduce ibuprofen use in the past   4.  Cervical spondylosis and degenerative disk no radiculopathy  5.  Adjustment d/o f/u with Neuropsych, pt specifically denies suicidal ideation, feel down  Pt called about concerns about not sleeping.  He did receive at his request injection of Celestone IM for his lumbar pain.  His back does feel a lot better.  He was having concerns about impotence which we discussed was not related to his Celestone.  This has been going on prior to the injection.  He has received some prednisone for breathing issues in the past as well.

## 2018-06-16 ENCOUNTER — Telehealth: Payer: Self-pay | Admitting: Physical Medicine & Rehabilitation

## 2018-06-16 NOTE — Telephone Encounter (Signed)
He states this happened last night and he feels due to the injection from yesterday

## 2018-06-16 NOTE — Telephone Encounter (Signed)
AK - patient called and states that he is having difficulty sleeping, difficulty with male reproductive organs - - wife is upset - he needs for you to tell him how to flush this out or fix this situation - needs a phone call asap!!

## 2018-06-16 NOTE — Telephone Encounter (Signed)
These are issues for PCP not PM&R

## 2018-06-22 NOTE — Telephone Encounter (Signed)
Spoke to patient. Advised primary care to deal with erectile and sleep issues.  Patient admits to not having a PCP.  I advised phone call to medicaid case manager for listing of potential PCP's

## 2018-06-22 NOTE — Telephone Encounter (Signed)
Left message on VM if has not resolved and further concerns he may call office back. ( apparently he has spoken with Mancel Parsons CMA.)

## 2018-08-05 ENCOUNTER — Encounter: Payer: Medicaid Other | Attending: Physical Medicine & Rehabilitation | Admitting: Psychology

## 2018-08-05 DIAGNOSIS — G8929 Other chronic pain: Secondary | ICD-10-CM | POA: Insufficient documentation

## 2018-08-05 DIAGNOSIS — M79642 Pain in left hand: Secondary | ICD-10-CM | POA: Insufficient documentation

## 2018-08-05 DIAGNOSIS — M542 Cervicalgia: Secondary | ICD-10-CM | POA: Insufficient documentation

## 2018-08-05 DIAGNOSIS — M5412 Radiculopathy, cervical region: Secondary | ICD-10-CM | POA: Insufficient documentation

## 2018-08-05 DIAGNOSIS — S2232XS Fracture of one rib, left side, sequela: Secondary | ICD-10-CM | POA: Insufficient documentation

## 2018-08-05 DIAGNOSIS — M79671 Pain in right foot: Secondary | ICD-10-CM | POA: Insufficient documentation

## 2018-08-05 DIAGNOSIS — S4292XS Fracture of left shoulder girdle, part unspecified, sequela: Secondary | ICD-10-CM | POA: Insufficient documentation

## 2018-08-05 DIAGNOSIS — F17290 Nicotine dependence, other tobacco product, uncomplicated: Secondary | ICD-10-CM | POA: Insufficient documentation

## 2018-08-05 DIAGNOSIS — R202 Paresthesia of skin: Secondary | ICD-10-CM | POA: Insufficient documentation

## 2018-08-05 DIAGNOSIS — M79605 Pain in left leg: Secondary | ICD-10-CM | POA: Insufficient documentation

## 2018-08-05 DIAGNOSIS — M79641 Pain in right hand: Secondary | ICD-10-CM | POA: Insufficient documentation

## 2018-08-05 DIAGNOSIS — M79672 Pain in left foot: Secondary | ICD-10-CM | POA: Insufficient documentation

## 2018-08-05 DIAGNOSIS — M545 Low back pain: Secondary | ICD-10-CM | POA: Insufficient documentation

## 2018-09-13 ENCOUNTER — Encounter: Payer: Medicaid Other | Attending: Physical Medicine & Rehabilitation | Admitting: Registered Nurse

## 2018-09-13 ENCOUNTER — Encounter: Payer: Self-pay | Admitting: Registered Nurse

## 2018-09-13 VITALS — BP 129/80 | HR 50 | Resp 16 | Ht 68.0 in | Wt 204.0 lb

## 2018-09-13 DIAGNOSIS — G8929 Other chronic pain: Secondary | ICD-10-CM

## 2018-09-13 DIAGNOSIS — M79671 Pain in right foot: Secondary | ICD-10-CM | POA: Diagnosis not present

## 2018-09-13 DIAGNOSIS — M545 Low back pain: Secondary | ICD-10-CM | POA: Diagnosis present

## 2018-09-13 DIAGNOSIS — M5030 Other cervical disc degeneration, unspecified cervical region: Secondary | ICD-10-CM

## 2018-09-13 DIAGNOSIS — M47812 Spondylosis without myelopathy or radiculopathy, cervical region: Secondary | ICD-10-CM

## 2018-09-13 DIAGNOSIS — M5412 Radiculopathy, cervical region: Secondary | ICD-10-CM | POA: Diagnosis not present

## 2018-09-13 DIAGNOSIS — M797 Fibromyalgia: Secondary | ICD-10-CM

## 2018-09-13 DIAGNOSIS — S2232XS Fracture of one rib, left side, sequela: Secondary | ICD-10-CM | POA: Insufficient documentation

## 2018-09-13 DIAGNOSIS — M47816 Spondylosis without myelopathy or radiculopathy, lumbar region: Secondary | ICD-10-CM

## 2018-09-13 DIAGNOSIS — M542 Cervicalgia: Secondary | ICD-10-CM | POA: Insufficient documentation

## 2018-09-13 DIAGNOSIS — M79641 Pain in right hand: Secondary | ICD-10-CM | POA: Diagnosis not present

## 2018-09-13 DIAGNOSIS — M79605 Pain in left leg: Secondary | ICD-10-CM | POA: Diagnosis not present

## 2018-09-13 DIAGNOSIS — F17290 Nicotine dependence, other tobacco product, uncomplicated: Secondary | ICD-10-CM | POA: Insufficient documentation

## 2018-09-13 DIAGNOSIS — R202 Paresthesia of skin: Secondary | ICD-10-CM | POA: Diagnosis not present

## 2018-09-13 DIAGNOSIS — S4292XS Fracture of left shoulder girdle, part unspecified, sequela: Secondary | ICD-10-CM | POA: Diagnosis not present

## 2018-09-13 DIAGNOSIS — M79672 Pain in left foot: Secondary | ICD-10-CM | POA: Insufficient documentation

## 2018-09-13 DIAGNOSIS — R208 Other disturbances of skin sensation: Secondary | ICD-10-CM | POA: Diagnosis not present

## 2018-09-13 DIAGNOSIS — M79642 Pain in left hand: Secondary | ICD-10-CM | POA: Insufficient documentation

## 2018-09-13 DIAGNOSIS — G8921 Chronic pain due to trauma: Secondary | ICD-10-CM | POA: Diagnosis not present

## 2018-09-13 DIAGNOSIS — M62838 Other muscle spasm: Secondary | ICD-10-CM

## 2018-09-13 DIAGNOSIS — F4310 Post-traumatic stress disorder, unspecified: Secondary | ICD-10-CM

## 2018-09-13 MED ORDER — PREGABALIN 25 MG PO CAPS: 25.0000 mg | ORAL_CAPSULE | Freq: Two times a day (BID) | ORAL | 1 refills | Status: DC

## 2018-09-13 MED ORDER — CYCLOBENZAPRINE HCL 5 MG PO TABS: 5.0000 mg | ORAL_TABLET | Freq: Two times a day (BID) | ORAL | 3 refills | Status: DC | PRN

## 2018-09-13 NOTE — Progress Notes (Signed)
Subjective:    Patient ID: Bradley Colon, male    DOB: 1971/09/24, 47 y.o.   MRN: [id number]  HPI: Mr. Bradley Colon is a 48 year old male who returns for follow up appointment for chronic pain and medication refill. He states his pain is located in his bilateral hands, left lower extremity and bilateral feet with tingling and burning also reports lower back pain.  Also states the gabapentin  Causes daytime drowsiness we will change therapy to Lyrica he verbalizes understanding. He rates his pain 5. His current exercise regime is walking.   Pain Inventory Average Pain 8 Pain Right Now 5 My pain is constant, sharp, burning, dull, stabbing, tingling, aching and migrating  In the last 24 hours, has pain interfered with the following? General activity n/a Relation with others n/a Enjoyment of life n/a What TIME of day is your pain at its worst? varies with activity Sleep (in general) Poor  Pain is worse with: walking, sitting, standing and some activites Pain improves with: medication and injections Relief from Meds: 3  Mobility walk without assistance  Function disabled: date disabled .  Neuro/Psych numbness tingling spasms depression  Prior Studies Any changes since last visit?  no  Physicians involved in your care Any changes since last visit?  no   Family History  Problem Relation Age of Onset  . Hypertension Maternal Grandmother   . Diabetes Maternal Grandmother   . Cancer Maternal Grandmother    Social History   Socioeconomic History  . Marital status: Married    Spouse name: Not on file  . Number of children: Not on file  . Years of education: Not on file  . Highest education level: Not on file  Occupational History  . Not on file  Social Needs  . Financial resource strain: Not on file  . Food insecurity:    Worry: Not on file    Inability: Not on file  . Transportation needs:    Medical: Not on file    Non-medical: Not on file  Tobacco Use  . Smoking  status: Light Tobacco Smoker    Types: Cigars  . Smokeless tobacco: Never Used  Substance and Sexual Activity  . Alcohol use: No    Frequency: Never    Comment:    . Drug use: Yes    Types: Marijuana    Comment: Last Used November 04 2015  . Sexual activity: Not on file  Lifestyle  . Physical activity:    Days per week: Not on file    Minutes per session: Not on file  . Stress: Not on file  Relationships  . Social connections:    Talks on phone: Not on file    Gets together: Not on file    Attends religious service: Not on file    Active member of club or organization: Not on file    Attends meetings of clubs or organizations: Not on file    Relationship status: Not on file  Other Topics Concern  . Not on file  Social History Narrative   Lives with partner   Has 2 sons with autism   1 daughter with ADD   Past Surgical History:  Procedure Laterality Date  . EPIDURAL BLOCK INJECTION    . SHOULDER SURGERY     Past Medical History:  Diagnosis Date  . Asthma   . Cervical radiculopathy   . Chronic back pain   . Kidney stones   . Reactive airway disease  BP 129/80   Pulse (!) 50   Resp 16   Ht 5\' 8"  (1.727 m)   Wt 204 lb (92.5 kg)   SpO2 96%   BMI 31.02 kg/m   Opioid Risk Score:   Fall Risk Score:  `1  Depression screen PHQ 2/9  Depression screen [facility] 2/9 05/14/2017 03/06/2017 08/11/2016 07/22/2016  Decreased Interest 1 3 0 0  Down, Depressed, Hopeless 2 1 0 1  PHQ - 2 Score 3 4 0 1  Altered sleeping 3 3 - -  Tired, decreased energy 3 3 - -  Change in appetite 3 3 - -  Feeling bad or failure about yourself  3 1 - -  Trouble concentrating 3 3 - -  Moving slowly or fidgety/restless 3 0 - -  Suicidal thoughts 0 0 - -  PHQ-9 Score 21 17 - -  Difficult doing work/chores Very difficult - - -    Review of Systems  Constitutional: Negative.   HENT: Negative.   Eyes: Negative.   Respiratory: Negative.   Cardiovascular: Negative.   Gastrointestinal: Negative.     Endocrine: Negative.   Genitourinary: Negative.   Musculoskeletal: Positive for back pain.       Spasms   Skin: Negative.   Neurological: Positive for numbness.       Tingling  Hematological: Negative.   Psychiatric/Behavioral: Positive for dysphoric mood.  All other systems reviewed and are negative.      Objective:   Physical Exam  Constitutional: He is oriented to person, place, and time. He appears well-nourished.  HENT:  Head: Normocephalic and atraumatic.  Neck: Normal range of motion. Neck supple.  Cardiovascular: Normal rate and regular rhythm.  Pulmonary/Chest: Effort normal and breath sounds normal.  Musculoskeletal:  Normal Muscle Bulk and Muscle Testing Reveals:  Upper Extremities: Full ROM and Muscle strength 5/5 Lower Extremities: Decreased ROM and Muscle Strength 5/5 Left Lower Extremity Flexion Produces Pain into Left Lower Extremity Arises from chair with ease Narrow Based Gait  Neurological: He is alert and oriented to person, place, and time.  Skin: Skin is warm and dry.  Psychiatric: He has a normal mood and affect. His behavior is normal.  Nursing note and vitals reviewed.         Assessment & Plan:  1. 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 Continue to Monitor.  2. Fibromyalgia/ Dysesthesia : RX: Lyrica 25 mg BID.  3. Cervical Spondylosis/ Cervical DDD: Continue HEP as Tolerated. Continue to Monitor.  4. Chronic Pain Syndrome: RX: Lyrica 5. PTSD: Schedule appointment with Dr. Sima Matas 6. Muscle Spasm: RX: Flexeril. Continue to monitor   30 minutes of face to face patient care time was spent during this visit. All questions were encouraged and answered.  F/U in 1 month

## 2018-09-13 NOTE — Patient Instructions (Signed)
Call office in two weeks to evaluate Lyrica : Medication Management: [phone]

## 2018-10-13 ENCOUNTER — Encounter: Payer: Medicaid Other | Admitting: Registered Nurse

## 2018-10-14 ENCOUNTER — Ambulatory Visit (HOSPITAL_COMMUNITY)
Admission: EM | Admit: 2018-10-14 | Discharge: 2018-10-14 | Disposition: A | Discharge status: Home / Self Care | Payer: Medicaid Other | Attending: Family Medicine | Admitting: Family Medicine

## 2018-10-14 ENCOUNTER — Encounter: Payer: Self-pay | Admitting: Registered Nurse

## 2018-10-14 ENCOUNTER — Ambulatory Visit: Payer: Medicaid Other | Admitting: Family Medicine

## 2018-10-14 ENCOUNTER — Encounter (HOSPITAL_COMMUNITY): Payer: Self-pay | Admitting: Emergency Medicine

## 2018-10-14 ENCOUNTER — Encounter: Payer: Medicaid Other | Attending: Physical Medicine & Rehabilitation | Admitting: Registered Nurse

## 2018-10-14 VITALS — BP 127/89 | HR 80 | Ht 68.0 in | Wt 198.0 lb

## 2018-10-14 DIAGNOSIS — F17290 Nicotine dependence, other tobacco product, uncomplicated: Secondary | ICD-10-CM | POA: Diagnosis not present

## 2018-10-14 DIAGNOSIS — M79672 Pain in left foot: Secondary | ICD-10-CM | POA: Diagnosis present

## 2018-10-14 DIAGNOSIS — S4292XS Fracture of left shoulder girdle, part unspecified, sequela: Secondary | ICD-10-CM | POA: Diagnosis not present

## 2018-10-14 DIAGNOSIS — R202 Paresthesia of skin: Secondary | ICD-10-CM | POA: Diagnosis not present

## 2018-10-14 DIAGNOSIS — M5412 Radiculopathy, cervical region: Secondary | ICD-10-CM | POA: Diagnosis not present

## 2018-10-14 DIAGNOSIS — R21 Rash and other nonspecific skin eruption: Secondary | ICD-10-CM | POA: Diagnosis not present

## 2018-10-14 DIAGNOSIS — M542 Cervicalgia: Secondary | ICD-10-CM | POA: Diagnosis present

## 2018-10-14 DIAGNOSIS — M62838 Other muscle spasm: Secondary | ICD-10-CM

## 2018-10-14 DIAGNOSIS — M47812 Spondylosis without myelopathy or radiculopathy, cervical region: Secondary | ICD-10-CM

## 2018-10-14 DIAGNOSIS — M79641 Pain in right hand: Secondary | ICD-10-CM | POA: Diagnosis not present

## 2018-10-14 DIAGNOSIS — R208 Other disturbances of skin sensation: Secondary | ICD-10-CM | POA: Diagnosis not present

## 2018-10-14 DIAGNOSIS — M545 Low back pain: Secondary | ICD-10-CM | POA: Diagnosis present

## 2018-10-14 DIAGNOSIS — G8921 Chronic pain due to trauma: Secondary | ICD-10-CM

## 2018-10-14 DIAGNOSIS — M797 Fibromyalgia: Secondary | ICD-10-CM

## 2018-10-14 DIAGNOSIS — M79671 Pain in right foot: Secondary | ICD-10-CM | POA: Insufficient documentation

## 2018-10-14 DIAGNOSIS — G8929 Other chronic pain: Secondary | ICD-10-CM | POA: Diagnosis not present

## 2018-10-14 DIAGNOSIS — F4310 Post-traumatic stress disorder, unspecified: Secondary | ICD-10-CM

## 2018-10-14 DIAGNOSIS — M79642 Pain in left hand: Secondary | ICD-10-CM | POA: Insufficient documentation

## 2018-10-14 DIAGNOSIS — S2232XS Fracture of one rib, left side, sequela: Secondary | ICD-10-CM | POA: Diagnosis not present

## 2018-10-14 DIAGNOSIS — M5030 Other cervical disc degeneration, unspecified cervical region: Secondary | ICD-10-CM | POA: Diagnosis not present

## 2018-10-14 DIAGNOSIS — M79605 Pain in left leg: Secondary | ICD-10-CM | POA: Diagnosis not present

## 2018-10-14 DIAGNOSIS — M47816 Spondylosis without myelopathy or radiculopathy, lumbar region: Secondary | ICD-10-CM

## 2018-10-14 MED ORDER — PREDNISONE 10 MG (21) PO TBPK: ORAL_TABLET | ORAL | 0 refills | Status: DC

## 2018-10-14 MED FILL — predniSONE 10 MG TABS: 10 | 6 days supply | Qty: 21 | Fill #0

## 2018-10-14 NOTE — Discharge Instructions (Signed)
We will go ahead and treat you with a steroid taper You can continue the Benadryl for itching Follow up as needed for continued or worsening symptoms

## 2018-10-14 NOTE — ED Triage Notes (Signed)
Pt presents to [facility] for assessment of full body rash, small in size, itchy

## 2018-10-14 NOTE — ED Provider Notes (Signed)
Bradley Colon    CSN: [id number] Arrival date & time: 10/14/18  1004     History   Chief Complaint Chief Complaint  Patient presents with  . Rash    HPI Bradley Colon is a 47 y.o. male.   Is a 47 year old male with past medical history of asthma, eczema, chronic back pain, fibromyalgia.  He also has multiple allergies.  He reports generalized, widespread maculopapular rash that is been very itchy for the last couple of days.  He contributes this rash to eating excess amount of sugar and change in diet.  Reporting very sensitive skin.  He reports similar reaction about a year ago.  He has been using some leftover prednisone he had for his symptoms.  He reports some relief with the prednisone with a rash to remains.  Denies any fever, joint pain. Denies any recent changes in lotions, detergents, foods or other possible irritants. No recent travel. Nobody else at home has the rash. Patient has been outside but denies any contact with plants or insects. No new foods or medications.   ROS per HPI      Past Medical History:  Diagnosis Date  . Asthma   . Cervical radiculopathy   . Chronic back pain   . Kidney stones   . Reactive airway disease     Patient Active Problem List   Diagnosis Date Noted  . Fibromyalgia 12/18/2017  . Dysesthesia affecting both sides of body 12/18/2017  . Spondylosis without myelopathy or radiculopathy, lumbar region 12/18/2017  . Other fatigue 07/27/2016  . Chronic left shoulder pain 07/22/2016  . Chronic neck pain 11/19/2015  . Chronic low back pain 09/18/2014    Past Surgical History:  Procedure Laterality Date  . EPIDURAL BLOCK INJECTION    . SHOULDER SURGERY         Home Medications    Prior to Admission medications   Medication Sig Start Date End Date Taking? Authorizing Provider  albuterol (PROVENTIL HFA;VENTOLIN HFA) 108 (90 Base) MCG/ACT inhaler Inhale 1 puff into the lungs every 6 (six) hours as needed for wheezing or  shortness of breath. 11/14/16   Shary Decamp, PA-C  albuterol (PROVENTIL) (2.5 MG/3ML) 0.083% nebulizer solution Take 2.5 mg by nebulization every 6 (six) hours as needed for wheezing or shortness of breath.    [provider]  cyclobenzaprine (FLEXERIL) 5 MG tablet Take 1 tablet (5 mg total) by mouth 2 (two) times daily as needed for muscle spasms. 09/13/18   Bayard Hugger, NP  ibuprofen (ADVIL,MOTRIN) 200 MG tablet Take 800 mg by mouth every 6 (six) hours as needed for fever, headache or mild pain.    [provider]  predniSONE (STERAPRED UNI-PAK 21 TAB) 10 MG (21) TBPK tablet 6 tabs for 1 day, then 5 tabs for 1 das, then 4 tabs for 1 day, then 3 tabs for 1 day, 2 tabs for 1 day, then 1 tab for 1 day 10/14/18   Loura Halt A, NP  pregabalin (LYRICA) 25 MG capsule Take 1 capsule (25 mg total) by mouth 2 (two) times daily. 09/13/18   Bayard Hugger, NP  Turmeric 500 MG CAPS Take 500 mg by mouth daily. 11/17/16   Boykin Nearing, MD    Family History Family History  Problem Relation Age of Onset  . Hypertension Maternal Grandmother   . Diabetes Maternal Grandmother   . Cancer Maternal Grandmother     Social History Social History   Tobacco Use  . Smoking  status: Light Tobacco Smoker    Types: Cigars  . Smokeless tobacco: Never Used  Substance Use Topics  . Alcohol use: No    Frequency: Never    Comment:    . Drug use: Yes    Types: Marijuana    Comment: Last Used November 04 2015     Allergies   Bee pollen; Bee venom; Other; Decadron [dexamethasone]; Shrimp [shellfish allergy]; Vancomycin; Wheat bran; and Latex   Review of Systems Review of Systems   Physical Exam Triage Vital Signs ED Triage Vitals  Enc Vitals Group     BP 10/14/18 1022 (!) 136/98     Pulse Rate 10/14/18 1022 65     Resp 10/14/18 1022 18     Temp 10/14/18 1022 98 F (36.7 C)     Temp Source 10/14/18 1022 Oral     SpO2 10/14/18 1022 91 %     Weight --      Height --      Head  Circumference --      Peak Flow --      Pain Score 10/14/18 1023 0     Pain Loc --      Pain Edu? --      Excl. in [place]? --    No data found.  Updated Vital Signs BP (!) 136/98 (BP Location: Left Arm)   Pulse 65   Temp 98 F (36.7 C) (Oral)   Resp 18   SpO2 91%   Visual Acuity Right Eye Distance:   Left Eye Distance:   Bilateral Distance:    Right Eye Near:   Left Eye Near:    Bilateral Near:     Physical Exam Vitals signs and nursing note reviewed.  Constitutional:      Appearance: Normal appearance.  HENT:     Head: Normocephalic and atraumatic.     Right Ear: Tympanic membrane normal.     Nose: Nose normal.     Mouth/Throat:     Mouth: Mucous membranes are dry.  Eyes:     Conjunctiva/sclera: Conjunctivae normal.  Neck:     Musculoskeletal: Normal range of motion.  Pulmonary:     Effort: Pulmonary effort is normal.  Musculoskeletal: Normal range of motion.  Skin:    General: Skin is warm and dry.     Findings: Rash present.     Comments: Diffuse maculopapular rash to chest, abdomen and back with dry patches.  Neurological:     Mental Status: He is alert.  Psychiatric:        Mood and Affect: Mood normal.      UC Treatments / Results  Labs (all labs ordered are listed, but only abnormal results are displayed) Labs Reviewed - No data to display  EKG None  Radiology No results found.  Procedures Procedures (including critical care time)  Medications Ordered in UC Medications - No data to display  Initial Impression / Assessment and Plan / UC Course  I have reviewed the triage vital signs and the nursing notes.  Pertinent labs & imaging results that were available during my care of the patient were reviewed by me and considered in my medical decision making (see chart for details).     Most likely some sort of atopic dermatitis We will do a prednisone taper to treat Benadryl as needed for itching Follow up as needed for continued or  worsening symptoms  Final Clinical Impressions(s) / UC Diagnoses   Final diagnoses:  Rash  Discharge Instructions     We will go ahead and treat you with a steroid taper You can continue the Benadryl for itching Follow up as needed for continued or worsening symptoms     ED Prescriptions    Medication Sig Dispense Auth. Provider   predniSONE (STERAPRED UNI-PAK 21 TAB) 10 MG (21) TBPK tablet 6 tabs for 1 day, then 5 tabs for 1 das, then 4 tabs for 1 day, then 3 tabs for 1 day, 2 tabs for 1 day, then 1 tab for 1 day 21 tablet Rozanna Box, Chong January A, NP     Controlled Substance Prescriptions [place] Controlled Substance Registry consulted? Not Applicable   Orvan July, NP 10/14/18 1221

## 2018-10-14 NOTE — Progress Notes (Signed)
Subjective:    Patient ID: Bradley Colon, male    DOB: 26-Sep-1971, 47 y.o.   MRN: [id number]  HPI: Bradley Colon is a 47 y.o. male who returns for follow up appointment for chronic pain and medication refill. He states his  pain is located in his bilateral hands and right foot. He rates his  pain 7. Also reports over the last month he had the flu and developed a generalized widespread maculopapular rash, so he hasn't taken any Lyrica.  States he had some prednisone from a previous prescription  in his home that he was taking , he didn't seek medical attention till today. Today he was seen in Urgent Care, note was reviewed. He was prescribed prednisone.   His  current exercise regime is walking.  Bradley Colon will began Lyrica after he completes  his prednisone he reports.     Pain Inventory Average Pain 7 Pain Right Now 7 My pain is sharp, dull, stabbing and aching  In the last 24 hours, has pain interfered with the following? General activity 4 Relation with others 4 Enjoyment of life 1 What TIME of day is your pain at its worst? night Sleep (in general) Poor  Pain is worse with: walking, bending, sitting, inactivity, standing and some activites Pain improves with: medication and injections Relief from Meds: 5  Mobility walk without assistance how many minutes can you walk? 3-5 ability to climb steps?  yes do you drive?  yes Do you have any goals in this area?  yes  Function disabled: date disabled .  Neuro/Psych No problems in this area  Prior Studies Any changes since last visit?  no  Physicians involved in your care Any changes since last visit?  no   Family History  Problem Relation Age of Onset  . Hypertension Maternal Grandmother   . Diabetes Maternal Grandmother   . Cancer Maternal Grandmother    Social History   Socioeconomic History  . Marital status: Married    Spouse name: Not on file  . Number of children: Not on file  . Years of education: Not on  file  . Highest education level: Not on file  Occupational History  . Not on file  Social Needs  . Financial resource strain: Not on file  . Food insecurity:    Worry: Not on file    Inability: Not on file  . Transportation needs:    Medical: Not on file    Non-medical: Not on file  Tobacco Use  . Smoking status: Light Tobacco Smoker    Types: Cigars  . Smokeless tobacco: Never Used  Substance and Sexual Activity  . Alcohol use: No    Frequency: Never    Comment:    . Drug use: Yes    Types: Marijuana    Comment: Last Used November 04 2015  . Sexual activity: Not on file  Lifestyle  . Physical activity:    Days per week: Not on file    Minutes per session: Not on file  . Stress: Not on file  Relationships  . Social connections:    Talks on phone: Not on file    Gets together: Not on file    Attends religious service: Not on file    Active member of club or organization: Not on file    Attends meetings of clubs or organizations: Not on file    Relationship status: Not on file  Other Topics Concern  . Not on file  Social History Narrative   Lives with partner   Has 2 sons with autism   1 daughter with ADD   Past Surgical History:  Procedure Laterality Date  . EPIDURAL BLOCK INJECTION    . SHOULDER SURGERY     Past Medical History:  Diagnosis Date  . Asthma   . Cervical radiculopathy   . Chronic back pain   . Kidney stones   . Reactive airway disease    BP 127/89   Pulse 80   Ht 5\' 8"  (1.727 m)   Wt 198 lb (89.8 kg)   SpO2 98%   BMI 30.11 kg/m   Opioid Risk Score:   Fall Risk Score:  `1  Depression screen PHQ 2/9  Depression screen [facility] 2/9 05/14/2017 03/06/2017 08/11/2016 07/22/2016  Decreased Interest 1 3 0 0  Down, Depressed, Hopeless 2 1 0 1  PHQ - 2 Score 3 4 0 1  Altered sleeping 3 3 - -  Tired, decreased energy 3 3 - -  Change in appetite 3 3 - -  Feeling bad or failure about yourself  3 1 - -  Trouble concentrating 3 3 - -  Moving slowly or  fidgety/restless 3 0 - -  Suicidal thoughts 0 0 - -  PHQ-9 Score 21 17 - -  Difficult doing work/chores Very difficult - - -    Review of Systems  Constitutional: Negative.   HENT: Negative.   Eyes: Negative.   Respiratory: Negative.   Gastrointestinal: Positive for constipation.  Endocrine: Negative.   Genitourinary: Negative.  Negative for difficulty urinating.  Musculoskeletal: Positive for arthralgias, back pain and neck pain.  Allergic/Immunologic: Negative.   Neurological: Negative.   Hematological: Negative.   Psychiatric/Behavioral: Negative.   All other systems reviewed and are negative.      Objective:   Physical Exam Vitals signs and nursing note reviewed.  Constitutional:      Appearance: Normal appearance.  Neck:     Musculoskeletal: Normal range of motion and neck supple.  Cardiovascular:     Rate and Rhythm: Normal rate and regular rhythm.     Pulses: Normal pulses.     Heart sounds: Normal heart sounds.  Pulmonary:     Effort: Pulmonary effort is normal.     Breath sounds: Normal breath sounds.  Musculoskeletal:     Comments: Normal Muscle Bulk and Muscle Testing Reveals:  Upper Extremities: Full ROM and Muscle Strength 5/5 Lower Extremities: Full ROM and Muscle Strength 5/5 Arises from chair with ease Narrow Based Gait   Skin:    General: Skin is warm and dry.     Findings: Rash present.     Comments: Generalized Maculopapular Rash  Neurological:     Mental Status: He is alert and oriented to person, place, and time.  Psychiatric:        Mood and Affect: Mood normal.        Behavior: Behavior normal.           Assessment & Plan:  1. 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 Continue to Monitor.  10/14/18 2. Fibromyalgia/ Dysesthesia :Continue Lyrica 25 mg BID. 10/14/18 3. Cervical Spondylosis/ Cervical DDD: Continue HEP as Tolerated. Continue to Monitor. 10/14/18 4. Chronic Pain Syndrome:  RContinue Lyrica. 10/14/18 5. PTSD: Has a cchedule appointment with Dr. Sima Matas. 10/14/18. 6. Muscle Spasm: Continue : Flexeril. Continue to monitor .10/14/18.  30 minutes of face to face patient care time was spent during this visit. All  questions were encouraged and answered.  F/U in 1 month

## 2018-10-19 ENCOUNTER — Ambulatory Visit (INDEPENDENT_AMBULATORY_CARE_PROVIDER_SITE_OTHER): Payer: Medicaid Other | Admitting: Family Medicine

## 2018-10-19 ENCOUNTER — Encounter: Payer: Self-pay | Admitting: Family Medicine

## 2018-10-19 VITALS — BP 119/73 | HR 78 | Resp 17 | Ht 68.0 in | Wt 198.4 lb

## 2018-10-19 DIAGNOSIS — K649 Unspecified hemorrhoids: Secondary | ICD-10-CM

## 2018-10-19 DIAGNOSIS — J4520 Mild intermittent asthma, uncomplicated: Secondary | ICD-10-CM

## 2018-10-19 DIAGNOSIS — G8929 Other chronic pain: Secondary | ICD-10-CM | POA: Diagnosis not present

## 2018-10-19 DIAGNOSIS — K5900 Constipation, unspecified: Secondary | ICD-10-CM | POA: Diagnosis not present

## 2018-10-19 DIAGNOSIS — Z7689 Persons encountering health services in other specified circumstances: Secondary | ICD-10-CM

## 2018-10-19 MED ORDER — POLYETHYLENE GLYCOL 3350 17 GM/SCOOP PO POWD: 17.0000 g | Freq: Two times a day (BID) | ORAL | 1 refills | Status: DC | PRN

## 2018-10-19 MED ORDER — ALBUTEROL SULFATE HFA 108 (90 BASE) MCG/ACT IN AERS: 2.0000 | INHALATION_SPRAY | Freq: Four times a day (QID) | RESPIRATORY_TRACT | 0 refills | Status: DC | PRN

## 2018-10-19 MED ORDER — ALBUTEROL SULFATE (2.5 MG/3ML) 0.083% IN NEBU: 2.5000 mg | INHALATION_SOLUTION | Freq: Four times a day (QID) | RESPIRATORY_TRACT | 1 refills | Status: DC | PRN

## 2018-10-19 NOTE — Progress Notes (Signed)
Bradley Colon, is a 47 y.o. male  ZDG:[id number]  RJJ:[id number]  DOB - [birth year]  CC:  Chief Complaint  Patient presents with  . Establish Care       HPI: Bradley Colon is a 47 y.o. male is here today to establish care.   Phyllis Whitefield has Chronic low back pain; Chronic neck pain; Chronic left shoulder pain; Other fatigue; Fibromyalgia; Dysesthesia affecting both sides of body; and Spondylosis without myelopathy or radiculopathy, lumbar region on their problem list.    Today's visit:  Patient is here today requesting refill of albuterol inhaler. Suffers from mild asthma symptom which occur only if he develops URI symptoms which typically occurs one annually with seasonal changes.  He is also requesting advise regarding chronic constipation. He is a vegetarian and eats a diet rich in starches, fruits, and some vegetables. Endorses increased water intake. He occasionally experiences hemorrhoids related to straining. Denies melena or rectal tearing. Last bowel movement within 24 hours.  Current medications: Current Outpatient Medications:  .  albuterol (PROVENTIL) (2.5 MG/3ML) 0.083% nebulizer solution, Take 2.5 mg by nebulization every 6 (six) hours as needed for wheezing or shortness of breath., Disp: , Rfl:  .  cyclobenzaprine (FLEXERIL) 5 MG tablet, Take 1 tablet (5 mg total) by mouth 2 (two) times daily as needed for muscle spasms., Disp: 30 tablet, Rfl: 3 .  ibuprofen (ADVIL,MOTRIN) 200 MG tablet, Take 800 mg by mouth every 6 (six) hours as needed for fever, headache or mild pain., Disp: , Rfl:  .  predniSONE (STERAPRED UNI-PAK 21 TAB) 10 MG (21) TBPK tablet, 6 tabs for 1 day, then 5 tabs for 1 das, then 4 tabs for 1 day, then 3 tabs for 1 day, 2 tabs for 1 day, then 1 tab for 1 day, Disp: 21 tablet, Rfl: 0 .  pregabalin (LYRICA) 25 MG capsule, Take 1 capsule (25 mg total) by mouth 2 (two) times daily., Disp: 60 capsule, Rfl: 1   Pertinent family medical history: family history includes Cancer  in his maternal grandmother; Diabetes in his maternal grandmother; Hypertension in his maternal grandmother.   Allergies  Allergen Reactions  . Bee Pollen Anaphylaxis and Swelling  . Bee Venom Anaphylaxis and Swelling    Pt has been stung by honey bees since he became a vegetarian at age 58 yrs old with no reaction.  . Other Shortness Of Breath and Itching    Reaction to cut grass, pet dander  . Decadron [Dexamethasone] Itching    Reaction to injection  . Shrimp [Shellfish Allergy] Swelling and Other (See Comments)    Muscle cramps  . Vancomycin Itching    Reaction to IV - pt states no reaction if it is infused slowly  . Wheat Bran Swelling    Some swelling  . Latex Hives and Rash    Reaction to fumes from latex paint    Social History   Socioeconomic History  . Marital status: Married    Spouse name: Not on file  . Number of children: Not on file  . Years of education: Not on file  . Highest education level: Not on file  Occupational History  . Not on file  Social Needs  . Financial resource strain: Not on file  . Food insecurity:    Worry: Not on file    Inability: Not on file  . Transportation needs:    Medical: Not on file    Non-medical: Not on file  Tobacco Use  . Smoking status: Light  Tobacco Smoker    Types: Cigars  . Smokeless tobacco: Never Used  Substance and Sexual Activity  . Alcohol use: No    Frequency: Never    Comment:    . Drug use: Yes    Types: Marijuana    Comment: Last Used November 04 2015  . Sexual activity: Not on file  Lifestyle  . Physical activity:    Days per week: Not on file    Minutes per session: Not on file  . Stress: Not on file  Relationships  . Social connections:    Talks on phone: Not on file    Gets together: Not on file    Attends religious service: Not on file    Active member of club or organization: Not on file    Attends meetings of clubs or organizations: Not on file    Relationship status: Not on file  .  Intimate partner violence:    Fear of current or ex partner: Not on file    Emotionally abused: Not on file    Physically abused: Not on file    Forced sexual activity: Not on file  Other Topics Concern  . Not on file  Social History Narrative   Lives with partner   Has 2 sons with autism   1 daughter with ADD    Review of Systems: Constitutional: Negative for fever, chills, diaphoresis, activity change, appetite change and fatigue. HEN   Objective:  There were no vitals filed for this visit.  BP Readings from Last 3 Encounters:  10/14/18 127/89  10/14/18 (!) 136/98  09/13/18 129/80    There were no vitals filed for this visit.    Physical Exam: Pertinent negatives listed in HPI General appearance: alert, well developed, well nourished, cooperative and in no distress Head: Normocephalic, without obvious abnormality, atraumatic Respiratory: Respirations even and unlabored, normal respiratory rate Heart: Rate, rhythm, normal. Negative murmurs.  Extremities: No gross deformities Skin: Skin color, texture, turgor normal. No rashes seen  Psych: Appropriate mood and affect. Neurologic: Mental status: Alert, oriented to person, place, and time, thought content appropriate. Lab Results  Component Value Date   HGBA1C 5.50 11/19/2015       Component Value Date/Time   CHOL 104 09/18/2014 1733   TRIG 57 09/18/2014 1733   HDL 33 (L) 09/18/2014 1733   CHOLHDL 3.2 09/18/2014 1733   VLDL 11 09/18/2014 1733   LDLCALC 60 09/18/2014 1733        Assessment and plan:  1. Encounter to establish care 2. Mild intermittent reactive airway disease with wheezing without complication Refilled albuterol inhaler and solution for nebulizer machine 3. Constipation, unspecified constipation type -Trial Miralax   Meds ordered this encounter  Medications  . albuterol (PROVENTIL) (2.5 MG/3ML) 0.083% nebulizer solution    Sig: Take 3 mLs (2.5 mg total) by nebulization every 6 (six) hours  as needed for wheezing or shortness of breath.    Dispense:  150 mL    Refill:  1  . albuterol (PROVENTIL HFA;VENTOLIN HFA) 108 (90 Base) MCG/ACT inhaler    Sig: Inhale 2 puffs into the lungs every 6 (six) hours as needed for wheezing or shortness of breath.    Dispense:  1 Inhaler    Refill:  0  . polyethylene glycol powder (GLYCOLAX/MIRALAX) powder    Sig: Take 17 g by mouth 2 (two) times daily as needed.    Dispense:  3350 g    Refill:  1  Return if symptoms worsen or fail to improve, for fasting labs with CPE , Complete Physical Exam.   The patient was given clear instructions to go to ER or return to medical center if symptoms don't improve, worsen or new problems develop. The patient verbalized understanding. The patient was advised  to call and obtain lab results if they haven't heard anything from out office within 7-10 business days.  Molli Barrows, FNP Primary Care at [name] [address] [place] [address] [phone]: [phone]    This note has been created with Dragon speech recognition software and Engineer, materials. Any transcriptional errors are unintentional.

## 2018-10-19 NOTE — Patient Instructions (Addendum)
Thank you for choosing Primary Care at [facility] to be your medical home!    Bradley Colon was seen by Molli Barrows, FNP today.   Otto Herb primary care provider is Scot Jun, FNP.   For the best care possible, you should try to see Molli Barrows, FNP-C whenever you come to the clinic.   We look forward to seeing you again soon!  If you have any questions about your visit today, please call us at [phone] or feel free to reach your primary care provider via [place].      Constipation, Adult Constipation is when a person:  Poops (has a bowel movement) fewer times in a week than normal.  Has a hard time pooping.  Has poop that is dry, hard, or bigger than normal.  Follow these instructions at home: Eating and drinking   Eat foods that have a lot of fiber, such as: ? Fresh fruits and vegetables. ? Whole grains. ? Beans.  Eat less of foods that are high in fat, low in fiber, or overly processed, such as: ? [place] fries. ? Hamburgers. ? Cookies. ? Candy. ? Soda.  Drink enough fluid to keep your pee (urine) clear or pale yellow. General instructions  Exercise regularly or as told by your doctor.  Go to the restroom when you feel like you need to poop. Do not hold it in.  Take over-the-counter and prescription medicines only as told by your doctor. These include any fiber supplements.  Do pelvic floor retraining exercises, such as: ? Doing deep breathing while relaxing your lower belly (abdomen). ? Relaxing your pelvic floor while pooping.  Watch your condition for any changes.  Keep all follow-up visits as told by your doctor. This is important. Contact a doctor if:  You have pain that gets worse.  You have a fever.  You have not pooped for 4 days.  You throw up (vomit).  You are not hungry.  You lose weight.  You are bleeding from the anus.  You have thin, pencil-like poop (stool). Get help right away if:  You have a fever,  and your symptoms suddenly get worse.  You leak poop or have blood in your poop.  Your belly feels hard or bigger than normal (is bloated).  You have very bad belly pain.  You feel dizzy or you faint. This information is not intended to replace advice given to you by your health care provider. Make sure you discuss any questions you have with your health care provider. Document Released: 04/07/2008 Document Revised: 05/09/2016 Document Reviewed: 04/09/2016 Elsevier Interactive Patient Education  2018 [employer].

## 2018-11-02 ENCOUNTER — Ambulatory Visit: Payer: Medicaid Other | Admitting: Family Medicine

## 2018-11-23 ENCOUNTER — Encounter: Payer: Medicaid Other | Attending: Physical Medicine & Rehabilitation | Admitting: Psychology

## 2018-11-23 ENCOUNTER — Encounter

## 2018-11-23 ENCOUNTER — Encounter: Payer: Self-pay | Admitting: Psychology

## 2018-11-23 DIAGNOSIS — M5412 Radiculopathy, cervical region: Secondary | ICD-10-CM | POA: Diagnosis not present

## 2018-11-23 DIAGNOSIS — G8929 Other chronic pain: Secondary | ICD-10-CM | POA: Diagnosis not present

## 2018-11-23 DIAGNOSIS — M79671 Pain in right foot: Secondary | ICD-10-CM | POA: Diagnosis not present

## 2018-11-23 DIAGNOSIS — G8921 Chronic pain due to trauma: Secondary | ICD-10-CM

## 2018-11-23 DIAGNOSIS — M79605 Pain in left leg: Secondary | ICD-10-CM | POA: Diagnosis not present

## 2018-11-23 DIAGNOSIS — F17290 Nicotine dependence, other tobacco product, uncomplicated: Secondary | ICD-10-CM | POA: Diagnosis not present

## 2018-11-23 DIAGNOSIS — S2232XS Fracture of one rib, left side, sequela: Secondary | ICD-10-CM | POA: Diagnosis not present

## 2018-11-23 DIAGNOSIS — R202 Paresthesia of skin: Secondary | ICD-10-CM | POA: Insufficient documentation

## 2018-11-23 DIAGNOSIS — M25512 Pain in left shoulder: Secondary | ICD-10-CM

## 2018-11-23 DIAGNOSIS — M79641 Pain in right hand: Secondary | ICD-10-CM | POA: Diagnosis not present

## 2018-11-23 DIAGNOSIS — M79672 Pain in left foot: Secondary | ICD-10-CM | POA: Diagnosis present

## 2018-11-23 DIAGNOSIS — M545 Low back pain: Secondary | ICD-10-CM | POA: Diagnosis present

## 2018-11-23 DIAGNOSIS — M5030 Other cervical disc degeneration, unspecified cervical region: Secondary | ICD-10-CM

## 2018-11-23 DIAGNOSIS — R208 Other disturbances of skin sensation: Secondary | ICD-10-CM

## 2018-11-23 DIAGNOSIS — F4310 Post-traumatic stress disorder, unspecified: Secondary | ICD-10-CM

## 2018-11-23 DIAGNOSIS — M542 Cervicalgia: Secondary | ICD-10-CM | POA: Diagnosis present

## 2018-11-23 DIAGNOSIS — S4292XS Fracture of left shoulder girdle, part unspecified, sequela: Secondary | ICD-10-CM | POA: Insufficient documentation

## 2018-11-23 DIAGNOSIS — M79642 Pain in left hand: Secondary | ICD-10-CM | POA: Insufficient documentation

## 2018-11-23 NOTE — Progress Notes (Signed)
Neuropsychological Consultation   Patient:   Bradley Colon   DOB:   December 29, 1970  MR Number:  [id number]  Location:  [facility] [place], [place] [id number] [place] [address] Dept: [phone]           Date of Service:   11/23/2018  Start Time:   11 AM End Time:   12  PM  Provider/Observer:  Ilean Skill, Psy.D.       Clinical Neuropsychologist       Billing Code/Service: [phone] 4 Units  Chief Complaint:    Bradley Colon was referred by Dr. Letta Pate for psychological/neuropsychological interventions due to issues related to the development of chronic pain and depression and possible fibromyalgia or RSD type symptoms.  Reason for Service:  was involved in a work related injury in 2013. The patient has had ongoing physical limitations and disability as well as significant ongoing pain since this severe injury. 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 He was working on a boat at [employer] yard when the owner of the boat yard was trying to move another boat with a bowed forklift that could raise a boat up to 40 feet high. The individual operating the forklift tried to move the boat with the Boat raised to its height.  The boat fell off the forklift or rales the forklift to fall down with the boat striking the patient in the left shoulder and twisting his neck and back and causing significant injury and to his shoulder. The patient reports that he was able to see the boat as it was beginning to fall on him. He reports that when he closes his eyes he still sees the boat and the crane falling towards them and thinking to himself how he needed to hope it did not land on his head and crush them and kill them. The patient experienced significant left shoulder injury as well as broken ribs, injured back. He reports  that this took an extensively long time to heal and that he still has chronic pain from this injury.  The patient had significant therapies in the past including physical therapy and epidural injections as well as work on both lower back and higher in his neck. The patient had a MRI of both his cervical spine as well as his back in 2017.  The patient reports that he received treatment primarily on his neck injury in his not receive the same type of treatment for his lumbar spine difficulties. The patient describes intermittent tingling in his hands and his feet as well as times where he'll have a burning sensation or feeling like water is running down his back or legs and also experienced numbness in his legs. The patient reports he is unable to sleep effectively due to neck pain. He reports whenever he holds his head still for any length of time that he will experience significant pain with any movement.  Current Status:  The patient returns after a long time away after our first visit.  The patient reports that he has had a lot of stress related to his girlfriend and their children as well as residual PTSD symptoms from his injuries.  The patient reports that he is constantly feeling in danger and feeling like something is going to fall on him or kill him.  The patient reports that the stressors with  his family heightened this feeling of being in constant danger.  We have continue to work on the symptoms.  Behavioral Observation: Bradley Colon  presents as a 48 y.o.-year-old Right African American Male who appeared his stated age. his dress was Appropriate and he was Well Groomed and his manners were Appropriate to the situation.  his participation was indicative of Appropriate, Attentive and impulsive behaviors.  There were  physical disabilities indicated by gait and posture and changes in posture while sitting.  he displayed an appropriate level of cooperation and motivation.     Interactions:    Active  Appropriate  Attention:   abnormal and attention span appeared shorter than expected for age  Memory:   within normal limits; recent and remote memory intact  Visuo-spatial:  within normal limits  Speech (Volume):  normal  Speech:   rapid; normal  Thought Process:  Coherent, Relevant and Tangential  Though Content:  WNL; not suicidal  Orientation:   person, place, time/date and situation  Judgment:   Good  Planning:   Fair  impulsive  Affect:    Anxious, Depressed and Labile  Mood:    Anxious and Depressed  Insight:   Good  Intelligence:   high  Marital Status/Living: The patient was born and raised in [place]. He is single but 6 children. He lives by himself.  Current Employment: The patient is not currently working and is been injured since 2013.  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  Substance Use:  No concerns of substance abuse are reported.    Education:   11th grade  Medical History:   Past Medical History:  Diagnosis Date  . Asthma   . Cervical radiculopathy   . Chronic back pain   . Kidney stones   . Reactive airway disease            Abuse/Trauma History: The patient experienced a significant traumatic event in 2013. 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 This individual is trying to transport the boat with the forklift extended to it's highest range.  THe wight of the boat caused it to tip over and fall on patient.  The boat impacted the patient's head, neck and shoulder.  The patient reports that he sees this event happening over and over whenever he closes his eyes.  Psychiatric History:  The patient has had significant difficulty adjusting to and dealing with the significant impact this injury is had on his financial life as well as his overall functioning and  physical functioning. The patient had no prior psychiatric history.  Family Med/Psych History:  Family History  Problem Relation Age of Onset  . Hypertension Maternal Grandmother   . Diabetes Maternal Grandmother   . Cancer Maternal Grandmother     Risk of Suicide/Violence: low the patient denies any suicidal or homicidal ideation.  Impression/DX:  Bradley Colon is a 49 year old male that was referred by Dr. Letta Pate for psychological/neuropsychological consultation. The patient describes increasing symptoms of anxiety and depression with loss of functioning and chronic pain symptoms. There may be the development of RSD symptoms as well or fibromyalgia-like symptoms. The patient acknowledges significant sleep deprivation which is likely playing a role in some of these symptoms as well. The patient has had documented issues with his cervical region a recent MRI studies were  complicated by movement artifact. The patient describes symptoms consistent with PTSD related to flashbacks and avoidance behaviors. There also issues of increasing and progressive mood disturbance either related to major depressive disorder or a mood disorder secondary to his general medical condition.  The patient returns after a long time away after our first visit.  The patient reports that he has had a lot of stress related to his girlfriend and their children as well as residual PTSD symptoms from his injuries.  The patient reports that he is constantly feeling in danger and feeling like something is going to fall on him or kill him.  The patient reports that the stressors with his family heightened this feeling of being in constant danger.  We have continue to work on the symptoms.   Disposition/Plan:  Will continue  the patient up for individual therapeutic interventions to help with mood difficulties as well as issues related to PTSD and chronic pain symptoms.  Diagnosis:    DDD (degenerative disc disease),  cervical  Chronic pain due to trauma  Dysesthesia affecting both sides of body  Post traumatic stress disorder (PTSD)  Chronic left shoulder pain         Electronically Signed   _______________________ Ilean Skill, Psy.D.

## 2018-12-01 MED FILL — PROAIR HFA 90 MCG INHALER: 108 (90 BAS | 25 days supply | Qty: 9 | Fill #0

## 2018-12-01 MED FILL — PREGABALIN 25 MG CAPS: 25 | 30 days supply | Qty: 60 | Fill #0

## 2018-12-09 ENCOUNTER — Encounter: Payer: Medicaid Other | Admitting: Psychology

## 2018-12-27 ENCOUNTER — Telehealth: Payer: Self-pay | Admitting: Family Medicine

## 2018-12-27 MED FILL — HYDROCORTISONE ACETATE 25 M: 25 | 7 days supply | Qty: 14 | Fill #0

## 2018-12-27 NOTE — Telephone Encounter (Signed)
Patient called requesting a call back from the nurse, patient didn't specify why just mentioned a hemorrhoid, please follow up.

## 2018-12-27 NOTE — Telephone Encounter (Signed)
This is not something that he has discussed with the provider previously. He would need to be seen either here or at urgent care.

## 2018-12-28 DIAGNOSIS — R4689 Other symptoms and signs involving appearance and behavior: Secondary | ICD-10-CM

## 2018-12-28 HISTORY — DX: Other symptoms and signs involving appearance and behavior: R46.89

## 2018-12-31 ENCOUNTER — Encounter: Payer: Medicaid Other | Admitting: Psychology

## 2019-01-05 ENCOUNTER — Ambulatory Visit
Admission: EM | Admit: 2019-01-05 | Discharge: 2019-01-05 | Disposition: A | Discharge status: Home / Self Care | Payer: Medicaid Other | Attending: Physician Assistant | Admitting: Physician Assistant

## 2019-01-05 DIAGNOSIS — M545 Low back pain, unspecified: Secondary | ICD-10-CM

## 2019-01-05 MED ORDER — PREDNISONE 50 MG PO TABS: 50.0000 mg | ORAL_TABLET | Freq: Every day | ORAL | 0 refills | Status: DC

## 2019-01-05 MED ORDER — MELOXICAM 7.5 MG PO TABS: 7.5000 mg | ORAL_TABLET | Freq: Every day | ORAL | 0 refills | Status: DC

## 2019-01-05 NOTE — ED Provider Notes (Signed)
[facility]    CSN: [id number] Arrival date & time: 01/05/19  1155     History   Chief Complaint Chief Complaint  Patient presents with  . Leg Pain    HPI Bradley Colon is a 48 y.o. male.   48 year old male comes in for leg pain. States that he was first seen for hemorrhoids, was given anusol with good relief. However, he is unsure if it is because he has been siting differently, has has had bilateral leg pain for the past few days. Denies injury/trauma. He states that certain movements can cause the pain to come on, which can be shooting pain down the lateral left leg. Denies saddle anesthesia, loss of bladder or bowel control. He had been taking mobic with some relief, but ran out. He tried one dose of steroid left over without relief.      Past Medical History:  Diagnosis Date  . Asthma   . Cervical radiculopathy   . Chronic back pain   . Kidney stones   . Reactive airway disease     Patient Active Problem List   Diagnosis Date Noted  . Fibromyalgia 12/18/2017  . Dysesthesia affecting both sides of body 12/18/2017  . Spondylosis without myelopathy or radiculopathy, lumbar region 12/18/2017  . Other fatigue 07/27/2016  . Chronic left shoulder pain 07/22/2016  . Chronic neck pain 11/19/2015  . Chronic low back pain 09/18/2014    Past Surgical History:  Procedure Laterality Date  . EPIDURAL BLOCK INJECTION    . SHOULDER SURGERY         Home Medications    Prior to Admission medications   Medication Sig Start Date End Date Taking? Authorizing Provider  albuterol (PROVENTIL HFA;VENTOLIN HFA) 108 (90 Base) MCG/ACT inhaler Inhale 2 puffs into the lungs every 6 (six) hours as needed for wheezing or shortness of breath. 10/19/18   Scot Jun, FNP  albuterol (PROVENTIL) (2.5 MG/3ML) 0.083% nebulizer solution Take 2.5 mg by nebulization every 6 (six) hours as needed for wheezing or shortness of breath.    [provider]  albuterol  (PROVENTIL) (2.5 MG/3ML) 0.083% nebulizer solution Take 3 mLs (2.5 mg total) by nebulization every 6 (six) hours as needed for wheezing or shortness of breath. 10/19/18   Scot Jun, FNP  cyclobenzaprine (FLEXERIL) 5 MG tablet Take 1 tablet (5 mg total) by mouth 2 (two) times daily as needed for muscle spasms. 09/13/18   Bayard Hugger, NP  ibuprofen (ADVIL,MOTRIN) 200 MG tablet Take 800 mg by mouth every 6 (six) hours as needed for fever, headache or mild pain.    [provider]  meloxicam (MOBIC) 7.5 MG tablet Take 1 tablet (7.5 mg total) by mouth daily. 01/05/19   Tasia Catchings, Mckynna Vanloan V, PA-C  polyethylene glycol powder (GLYCOLAX/MIRALAX) powder Take 17 g by mouth 2 (two) times daily as needed. 10/19/18   Scot Jun, FNP  predniSONE (DELTASONE) 50 MG tablet Take 1 tablet (50 mg total) by mouth daily with breakfast. 01/05/19   Tasia Catchings, Ona Roehrs V, PA-C  pregabalin (LYRICA) 25 MG capsule Take 1 capsule (25 mg total) by mouth 2 (two) times daily. 09/13/18   Bayard Hugger, NP    Family History Family History  Problem Relation Age of Onset  . Hypertension Maternal Grandmother   . Diabetes Maternal Grandmother   . Cancer Maternal Grandmother     Social History Social History   Tobacco Use  . Smoking status: Light Tobacco Smoker  Types: Cigars  . Smokeless tobacco: Never Used  Substance Use Topics  . Alcohol use: No    Frequency: Never    Comment:    . Drug use: Yes    Types: Marijuana    Comment: Last Used November 04 2015     Allergies   Bee pollen; Bee venom; Other; Decadron [dexamethasone]; Shrimp [shellfish allergy]; Vancomycin; Wheat bran; and Latex   Review of Systems Review of Systems  Reason unable to perform ROS: See HPI as above.     Physical Exam Triage Vital Signs ED Triage Vitals [01/05/19 1239]  Enc Vitals Group     BP 120/77     Pulse Rate 81     Resp 18     Temp 98.1 F (36.7 C)     Temp Source Oral     SpO2 97 %     Weight      Height       Head Circumference      Peak Flow      Pain Score 10     Pain Loc      Pain Edu?      Excl. in [place]?    No data found.  Updated Vital Signs BP 120/77 (BP Location: Left Arm)   Pulse 81   Temp 98.1 F (36.7 C) (Oral)   Resp 18   SpO2 97%   Physical Exam Constitutional:      General: He is not in acute distress.    Appearance: He is well-developed. He is not diaphoretic.  HENT:     Head: Normocephalic and atraumatic.  Eyes:     Conjunctiva/sclera: Conjunctivae normal.     Pupils: Pupils are equal, round, and reactive to light.  Musculoskeletal:     Comments: Difficult exam due to patient cooperation. Squirming during exam. Diffuse tenderness to palpation of the back. States there is pain to the lateral thigh, but moves away during palpation. He flinches and complains of pain with certain movement, but later observed to repeat movement without difficulty. He has full ROM of back and hips. Strength deferred. Sensation intact and equal bilaterally. He is walking with stable gait and normal coordination.   Neurological:     Mental Status: He is alert and oriented to person, place, and time.      UC Treatments / Results  Labs (all labs ordered are listed, but only abnormal results are displayed) Labs Reviewed - No data to display  EKG None  Radiology No results found.  Procedures Procedures (including critical care time)  Medications Ordered in UC Medications - No data to display  Initial Impression / Assessment and Plan / UC Course  I have reviewed the triage vital signs and the nursing notes.  Pertinent labs & imaging results that were available during my care of the patient were reviewed by me and considered in my medical decision making (see chart for details).    ?posisbe nerve involvement with patient's description of pain. However, difficult exam due to patient cooperation. Will try prednisone to help with symptoms. Can take mobic for pain. Patient to follow up  with PCP for further evaluation needed if symptoms not improving. Return precautions given.   Final Clinical Impressions(s) / UC Diagnoses   Final diagnoses:  Acute bilateral low back pain without sciatica    ED Prescriptions    Medication Sig Dispense Auth. Provider   predniSONE (DELTASONE) 50 MG tablet Take 1 tablet (50 mg total) by mouth daily with breakfast.  5 tablet Nyjah Denio V, PA-C   meloxicam (MOBIC) 7.5 MG tablet Take 1 tablet (7.5 mg total) by mouth daily. 10 tablet Tobin Chad, PA-C 01/05/19 1346

## 2019-01-05 NOTE — ED Triage Notes (Signed)
Pt states needs follow up from hemorrhoid flare up and now having lt leg pain from laying on rt side x3 days

## 2019-01-05 NOTE — Discharge Instructions (Signed)
Prednisone as directed. Continue mobic as needed for pain. Follow up with PCP for further evaluation needed. If experiencing loss of bladder or bowel control, numbness/tingling of inner thighs.

## 2019-01-13 ENCOUNTER — Encounter: Payer: Self-pay | Admitting: Physical Medicine & Rehabilitation

## 2019-01-13 ENCOUNTER — Encounter: Payer: Medicaid Other | Attending: Physical Medicine & Rehabilitation

## 2019-01-13 ENCOUNTER — Other Ambulatory Visit: Payer: Self-pay

## 2019-01-13 ENCOUNTER — Ambulatory Visit: Payer: Medicaid Other | Admitting: Physical Medicine & Rehabilitation

## 2019-01-13 VITALS — BP 143/88 | HR 61 | Ht 68.0 in | Wt 194.0 lb

## 2019-01-13 DIAGNOSIS — F17290 Nicotine dependence, other tobacco product, uncomplicated: Secondary | ICD-10-CM | POA: Insufficient documentation

## 2019-01-13 DIAGNOSIS — M25552 Pain in left hip: Secondary | ICD-10-CM

## 2019-01-13 DIAGNOSIS — M79641 Pain in right hand: Secondary | ICD-10-CM | POA: Diagnosis not present

## 2019-01-13 DIAGNOSIS — M542 Cervicalgia: Secondary | ICD-10-CM | POA: Diagnosis present

## 2019-01-13 DIAGNOSIS — M545 Low back pain: Secondary | ICD-10-CM | POA: Diagnosis present

## 2019-01-13 DIAGNOSIS — M79671 Pain in right foot: Secondary | ICD-10-CM | POA: Diagnosis not present

## 2019-01-13 DIAGNOSIS — R202 Paresthesia of skin: Secondary | ICD-10-CM | POA: Diagnosis not present

## 2019-01-13 DIAGNOSIS — M5412 Radiculopathy, cervical region: Secondary | ICD-10-CM | POA: Diagnosis not present

## 2019-01-13 DIAGNOSIS — M79672 Pain in left foot: Secondary | ICD-10-CM | POA: Insufficient documentation

## 2019-01-13 DIAGNOSIS — M797 Fibromyalgia: Secondary | ICD-10-CM | POA: Diagnosis not present

## 2019-01-13 DIAGNOSIS — S2232XS Fracture of one rib, left side, sequela: Secondary | ICD-10-CM | POA: Insufficient documentation

## 2019-01-13 DIAGNOSIS — G8929 Other chronic pain: Secondary | ICD-10-CM | POA: Diagnosis not present

## 2019-01-13 DIAGNOSIS — S4292XS Fracture of left shoulder girdle, part unspecified, sequela: Secondary | ICD-10-CM | POA: Insufficient documentation

## 2019-01-13 DIAGNOSIS — M79605 Pain in left leg: Secondary | ICD-10-CM | POA: Insufficient documentation

## 2019-01-13 DIAGNOSIS — M79642 Pain in left hand: Secondary | ICD-10-CM | POA: Diagnosis not present

## 2019-01-13 MED ORDER — MELOXICAM 7.5 MG PO TABS: 7.5000 mg | ORAL_TABLET | Freq: Every day | ORAL | 1 refills | Status: DC

## 2019-01-13 NOTE — Progress Notes (Signed)
Subjective:    Patient ID: Bradley Colon, male    DOB: [birth year], 48 y.o.   MRN: [id number]  HPI Felt bad after eating "impossible burger"  Using suppositiory for constipation Patient complains of a pulling sensation between his legs as well as in the hamstring area and hip area in the buttocks area.  This has been going on for the last month or 2.  The patient states he has been on the toilet for prolonged periods of time.  Sitting exacerbates his pain. Has been on prednisone for hip problems Ibuprofen is helpful for a couple hours  Remains independent with all self-care and mobility.  He states that sometimes he can do push-ups and sit ups and other times he has spasms of pain that only last a second or 2.  Had an episode of feeling totally normal which lasted for 63minutes Pain Inventory Average Pain 8 Pain Right Now 8 My pain is sharp, burning, dull, stabbing and aching  In the last 24 hours, has pain interfered with the following? General activity 0 Relation with others 0 Enjoyment of life 0 What TIME of day is your pain at its worst? morning Sleep (in general) Poor  Pain is worse with: walking, bending, sitting, inactivity, standing and some activites Pain improves with: heat/ice Relief from Meds: 0  Mobility walk without assistance ability to climb steps?  yes do you drive?  yes  Function disabled: date disabled .  Neuro/Psych spasms anxiety  Prior Studies Any changes since last visit?  no  Physicians involved in your care Any changes since last visit?  no   Family History  Problem Relation Age of Onset  . Hypertension Maternal Grandmother   . Diabetes Maternal Grandmother   . Cancer Maternal Grandmother    Social History   Socioeconomic History  . Marital status: Married    Spouse name: Not on file  . Number of children: Not on file  . Years of education: Not on file  . Highest education level: Not on file  Occupational History  . Not on file   Social Needs  . Financial resource strain: Not on file  . Food insecurity:    Worry: Not on file    Inability: Not on file  . Transportation needs:    Medical: Not on file    Non-medical: Not on file  Tobacco Use  . Smoking status: Light Tobacco Smoker    Types: Cigars  . Smokeless tobacco: Never Used  Substance and Sexual Activity  . Alcohol use: No    Frequency: Never    Comment:    . Drug use: Yes    Types: Marijuana    Comment: Last Used November 04 2015  . Sexual activity: Not on file  Lifestyle  . Physical activity:    Days per week: Not on file    Minutes per session: Not on file  . Stress: Not on file  Relationships  . Social connections:    Talks on phone: Not on file    Gets together: Not on file    Attends religious service: Not on file    Active member of club or organization: Not on file    Attends meetings of clubs or organizations: Not on file    Relationship status: Not on file  Other Topics Concern  . Not on file  Social History Narrative   Lives with partner   Has 2 sons with autism   1 daughter with ADD  Past Surgical History:  Procedure Laterality Date  . EPIDURAL BLOCK INJECTION    . SHOULDER SURGERY     Past Medical History:  Diagnosis Date  . Asthma   . Cervical radiculopathy   . Chronic back pain   . Kidney stones   . Reactive airway disease    There were no vitals taken for this visit.  Opioid Risk Score:   Fall Risk Score:  `1  Depression screen PHQ 2/9  Depression screen [facility] 2/9 05/14/2017 03/06/2017 08/11/2016 07/22/2016  Decreased Interest 1 3 0 0  Down, Depressed, Hopeless 2 1 0 1  PHQ - 2 Score 3 4 0 1  Altered sleeping 3 3 - -  Tired, decreased energy 3 3 - -  Change in appetite 3 3 - -  Feeling bad or failure about yourself  3 1 - -  Trouble concentrating 3 3 - -  Moving slowly or fidgety/restless 3 0 - -  Suicidal thoughts 0 0 - -  PHQ-9 Score 21 17 - -  Difficult doing work/chores Very difficult - - -     Review  of Systems  Constitutional: Positive for unexpected weight change.  HENT: Negative.   Eyes: Negative.   Respiratory: Negative.   Cardiovascular: Negative.   Gastrointestinal: Positive for constipation.  Endocrine: Negative.   Genitourinary: Negative.   Musculoskeletal: Positive for arthralgias and myalgias.  Skin: Positive for rash.  Allergic/Immunologic: Negative.   Neurological: Negative.   Hematological: Negative.   Psychiatric/Behavioral: The patient is nervous/anxious.   All other systems reviewed and are negative.      Objective:   Physical Exam Vitals signs and nursing note reviewed.  Constitutional:      Appearance: Normal appearance.  HENT:     Head: Normocephalic and atraumatic.     Nose: Nose normal.     Mouth/Throat:     Mouth: Mucous membranes are moist.     Pharynx: Oropharynx is clear.  Eyes:     Extraocular Movements: Extraocular movements intact.     Conjunctiva/sclera: Conjunctivae normal.     Pupils: Pupils are equal, round, and reactive to light.  Neurological:     General: No focal deficit present.     Mental Status: He is alert and oriented to person, place, and time.  Psychiatric:     Comments: Very animated, hyperkinetic   Strength is 5/5 bilateral deltoid bicep tricep grip hip flexor knee extensor ankle dorsiflexor Patient has normal range of motion in the cervical spine thoracic spine lumbar spine as well as shoulder elbow wrist and hands. He does have mildly reduced hip internal and external rotation on the right side and moderately reduced hip internal extra rotation on the left side accompanied by mild to moderate pain.  He has mild tenderness to palpation over the right greater trochanter and moderate over the left greater trochanter. He has normal knee foot and ankle range of motion without pain. His gait is without evidence of toe drag or knee instability he does not require assistive device. He has no tenderness  along the cervical  thoracic or lumbar area paraspinals        Assessment & Plan:  19.  48 year old male with diagnosis of fibromyalgia syndrome.  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  The patient is anxious about his pain complaints.  He was encouraged when he had a short period of time where he had absolutely no pain at all.  He  does not tolerate medications such as Cymbalta or Lyrica or opioid pain medications.  We discussed exercise and in particular gluteal strengthening. Also ordered left hip x-ray given his decreased range of motion.  If x-rays negative this is likely a soft tissue issue We discussed that consistency with stretching and exercise will be needed to help with his symptoms. Follow-up in 6 months Follow-up with neuropsychology for anxiety related to his pain complaints

## 2019-01-20 ENCOUNTER — Telehealth: Payer: Self-pay | Admitting: Family Medicine

## 2019-01-20 NOTE — Progress Notes (Signed)
GI referral placed today

## 2019-01-20 NOTE — Addendum Note (Signed)
Addended by: Scot Jun on: 01/20/2019 06:06 PM   Modules accepted: Orders

## 2019-01-20 NOTE — Telephone Encounter (Signed)
Completed.

## 2019-01-20 NOTE — Telephone Encounter (Signed)
Patient would like GI referral.

## 2019-01-20 NOTE — Progress Notes (Signed)
Called patient and he stated that he would like to see provider because of ongoing issue.

## 2019-01-21 ENCOUNTER — Ambulatory Visit: Payer: Medicaid Other | Admitting: Family Medicine

## 2019-01-24 ENCOUNTER — Telehealth: Payer: Self-pay | Admitting: Gastroenterology

## 2019-01-24 NOTE — Telephone Encounter (Signed)
Referral from 01/20/19. Please advise as to scheduling

## 2019-01-25 ENCOUNTER — Telehealth: Payer: Self-pay

## 2019-01-25 NOTE — Telephone Encounter (Signed)
PT is sched with Dr.Mansouraty 01/27/2019 @8 :30am

## 2019-01-25 NOTE — Telephone Encounter (Signed)
Please see the response from Dr Carlean Purl. Call the patient and schedule him for a face to face visit with a male provider. Thank you.

## 2019-01-25 NOTE — Telephone Encounter (Signed)
Someone should be able to see him this week I would think  I think this one may need to be an in-office visit   I would not schedule w/ male provider based upon notes from the urgent care

## 2019-01-25 NOTE — Telephone Encounter (Signed)
Pt called back in stating that he is in pain and need to see a doctor asap! He stated that he was only giving enough medication for 5days enough to follow up with provider and his symptoms has worsen and needs to see the doctor.

## 2019-01-25 NOTE — Telephone Encounter (Signed)
Covid-19 travel screening questions  Have you traveled in the last 14 days? If yes where? No  Do you now or have you had a fever in the last 14 days? No  Do you have any respiratory symptoms of shortness of breath or cough now or in the last 14 days? No  Do you have a medical history of Congestive Heart Failure? n/a  Do you have a medical history of lung disease? n/a  Do you have any family members or close contacts with diagnosed or suspected Covid-19? No

## 2019-01-25 NOTE — Telephone Encounter (Signed)
Dr Carlean Purl would you be willing to review this referral? Please be sure to review his encounter notes in Care Everywhere. Thank you

## 2019-01-26 ENCOUNTER — Telehealth: Payer: Self-pay

## 2019-01-26 NOTE — Telephone Encounter (Signed)
Covid-19 travel screening questions  Have you traveled in the last 14 days? If yes where?  Do you now or have you had a fever in the last 14 days?  Do you have any respiratory symptoms of shortness of breath or cough now or in the last 14 days?  Do you have a medical history of Congestive Heart Failure?  Do you have a medical history of lung disease?  Do you have any family members or close contacts with diagnosed or suspected Covid-19?      I was unable to reach the patient.  I left a message that if he could answer yes to any of the questions he needed to call the office prior to coming in the am.

## 2019-01-27 ENCOUNTER — Ambulatory Visit: Payer: Medicaid Other | Admitting: Gastroenterology

## 2019-01-28 ENCOUNTER — Other Ambulatory Visit (INDEPENDENT_AMBULATORY_CARE_PROVIDER_SITE_OTHER): Payer: Medicaid Other

## 2019-01-28 ENCOUNTER — Encounter: Payer: Self-pay | Admitting: Gastroenterology

## 2019-01-28 ENCOUNTER — Ambulatory Visit (INDEPENDENT_AMBULATORY_CARE_PROVIDER_SITE_OTHER): Payer: Medicaid Other | Admitting: Gastroenterology

## 2019-01-28 ENCOUNTER — Other Ambulatory Visit: Payer: Self-pay

## 2019-01-28 VITALS — BP 140/80 | HR 94 | Temp 98.3°F | Ht 68.0 in | Wt 195.0 lb

## 2019-01-28 DIAGNOSIS — R194 Change in bowel habit: Secondary | ICD-10-CM

## 2019-01-28 DIAGNOSIS — K642 Third degree hemorrhoids: Secondary | ICD-10-CM | POA: Diagnosis not present

## 2019-01-28 DIAGNOSIS — K6289 Other specified diseases of anus and rectum: Secondary | ICD-10-CM

## 2019-01-28 DIAGNOSIS — K648 Other hemorrhoids: Secondary | ICD-10-CM | POA: Diagnosis not present

## 2019-01-28 DIAGNOSIS — K5900 Constipation, unspecified: Secondary | ICD-10-CM

## 2019-01-28 LAB — SEDIMENTATION RATE: Sed Rate: 19 mm/hr — ABNORMAL HIGH (ref 0–15)

## 2019-01-28 LAB — HIGH SENSITIVITY CRP: CRP HIGH SENSITIVITY: 16.42 mg/L — AB (ref 0.000–5.000)

## 2019-01-28 LAB — BASIC METABOLIC PANEL
BUN: 20 mg/dL (ref 6–23)
CHLORIDE: 102 meq/L (ref 96–112)
CO2: 28 meq/L (ref 19–32)
Calcium: 10 mg/dL (ref 8.4–10.5)
Creatinine, Ser: 1.17 mg/dL (ref 0.40–1.50)
GFR: 80.03 mL/min (ref >60.00)
GLUCOSE: 86 mg/dL (ref 70–99)
POTASSIUM: 4.6 meq/L (ref 3.5–5.1)
SODIUM: 139 meq/L (ref 135–145)

## 2019-01-28 LAB — CBC
HEMATOCRIT: 44.1 % (ref 39.0–52.0)
Hemoglobin: 14.6 g/dL (ref 13.0–17.0)
MCHC: 33.2 g/dL (ref 30.0–36.0)
MCV: 88.3 fl (ref 78.0–100.0)
PLATELETS: 279 10*3/uL (ref 150.0–400.0)
RBC: 4.99 Mil/uL (ref 4.22–5.81)
RDW: 13.8 % (ref 11.5–15.5)
WBC: 7.1 10*3/uL (ref 4.0–10.5)

## 2019-01-28 LAB — TSH: TSH: 0.49 u[IU]/mL (ref 0.35–4.50)

## 2019-01-28 MED ORDER — HYDROCORTISONE ACETATE 25 MG RE SUPP: 25.0000 mg | Freq: Every day | RECTAL | 1 refills | Status: DC

## 2019-01-28 MED ORDER — LIDOCAINE 4 % EX CREA: 1.0000 "application " | TOPICAL_CREAM | CUTANEOUS | 0 refills | Status: DC | PRN

## 2019-01-28 MED ORDER — NITROGLYCERIN 0.4 % RE OINT: TOPICAL_OINTMENT | RECTAL | 1 refills | Status: DC

## 2019-01-28 MED ORDER — HYDROCORTISONE 2.5 % RE CREA: 1.0000 "application " | TOPICAL_CREAM | Freq: Two times a day (BID) | RECTAL | 2 refills | Status: DC

## 2019-01-28 MED ORDER — PEG 3350-KCL-NA BICARB-NACL 420 G PO SOLR: 4000.0000 mL | Freq: Once | ORAL | 0 refills | Status: AC

## 2019-01-28 MED FILL — RECTIV 0.4% OINTMENT: 0.4 | 10 days supply | Qty: 30 | Fill #0

## 2019-01-28 MED FILL — HYDROCORTISONE ACETATE 25 M: 25 | 30 days supply | Qty: 30 | Fill #0

## 2019-01-28 MED FILL — PEG-3350 SOLUTION: 420 | 1 days supply | Qty: 4000 | Fill #0

## 2019-01-28 MED FILL — PROCTOZONE-HC 2.5 % CREA: 2.5 | 30 days supply | Qty: 30 | Fill #0

## 2019-01-28 MED FILL — MELOXICAM 7.5 MG TABLET: 7.5 | 30 days supply | Qty: 30 | Fill #0

## 2019-01-28 NOTE — Patient Instructions (Addendum)
If you are age 50 or older, your body mass index should be between 23-30. Your Body mass index is 29.65 kg/m. If this is out of the aforementioned range listed, please consider follow up with your Primary Care Provider.  If you are age 75 or younger, your body mass index should be between 19-25. Your Body mass index is 29.65 kg/m. If this is out of the aformentioned range listed, please consider follow up with your Primary Care Provider.   Your provider has requested that you go to the basement level for lab work before leaving today. Press "B" on the elevator. The lab is located at the first door on the left as you exit the elevator.   We have sent the following medications to your pharmacy for you to pick up at your convenience: Anusol supp and Anusol cream   We have given you samples of the following medication to take: Linzess 69mcg  Hold Miralax while taking Linzess. Continue Stool Softner as directed.   Written prescription given for Nitroglycerin 4% for you to take to pharmacy of your choice, cost wise.    You have been scheduled for a colonoscopy. Please follow written instructions given to you at your visit today.  Please pick up your prep supplies at the pharmacy within the next 1-3 days. If you use inhalers (even only as needed), please bring them with you on the day of your procedure. Your physician has requested that you go to www.startemmi.com and enter the access code given to you at your visit today. This web site gives a general overview about your procedure. However, you should still follow specific instructions given to you by our office regarding your preparation for the procedure.    How to Take a [employer] A sitz bath is a warm water bath that may be used to care for your rectum, genital area, or the area between your rectum and genitals (perineum). For a sitz bath, the water only comes up to your hips and covers your buttocks. A sitz bath may done at home in a bathtub  or with a portable sitz bath that fits over the toilet. Your health care provider may recommend a sitz bath to help:  Relieve pain and discomfort after delivering a baby.  Relieve pain and itching from hemorrhoids or anal fissures.  Relieve pain after certain surgeries.  Relax muscles that are sore or tight. How to take a sitz bath Take 2  sitz baths a day, or as many as told by your health care provider. Bathtub sitz bath To take a sitz bath in a bathtub: 1. Partially fill a bathtub with warm water. The water should be deep enough to cover your hips and buttocks when you are sitting in the tub. 2. If your health care provider told you to put medicine in the water, follow his or her instructions. 3. Sit in the water. 4. Open the tub drain a little, and leave it open during your bath. 5. Turn on the warm water again, enough to replace the water that is draining out. Keep the water running throughout your bath. This helps keep the water at the right level and the right temperature. 6. Soak in the water for 15-20 minutes, or as long as told by your health care provider. 7. When you are done, be careful when you stand up. You may feel dizzy. 8. After the sitz bath, pat yourself dry. Do not rub your skin to dry it.  Over-the-toilet  sitz bath To take a sitz bath with an over-the-toilet basin: 1. Follow the manufacturer's instructions. 2. Fill the basin with warm water. 3. If your health care provider told you to put medicine in the water, follow his or her instructions. 4. Sit on the seat. Make sure the water covers your buttocks and perineum. 5. Soak in the water for 15-20 minutes, or as long as told by your health care provider. 6. After the sitz bath, pat yourself dry. Do not rub your skin to dry it. 7. Clean and dry the basin between uses. 8. Discard the basin if it cracks, or according to the manufacturer's instructions. Contact a health care provider if:  Your symptoms get worse.  Do not continue with sitz baths if your symptoms get worse.  You have new symptoms. If this happens, do not continue with sitz baths until you talk with your health care provider. Summary  A sitz bath is a warm water bath in which the water only comes up to your hips and covers your buttocks.  A sitz bath may help relieve itching, relieve pain, and relax muscles that are sore or tight in the lower part of your body,   including your genital area.  Take 2 sitz baths a day, or as many as told by your health care provider. Soak in the water for 15-20 minutes.  Do not continue with sitz baths if your symptoms get worse. This information is not intended to replace advice given to you by your health care provider. Make sure you discuss any questions you have with your health care provider. Document Released: 07/12/2004 Document Revised: 10/22/2017 Document Reviewed: 10/22/2017 Elsevier Interactive Patient Education  2019 [employer].

## 2019-01-28 NOTE — Progress Notes (Signed)
GASTROENTEROLOGY OUTPATIENT CLINIC VISIT   Primary Care Provider Scot Jun, [place] [id number] [phone]  Referring Provider Scot Jun, [place], [place] [address] [phone]  Patient Profile: Bradley Colon is a 48 y.o. male with a pmh significant for active airway disease/asthma, chronic back pain (on opioid therapy), nephrolithiasis, cervical radiculopathy, hemorrhoids, tobacco use.  The patient presents to the [facility] for an evaluation and management of problem(s) noted below:  Problem List 1. Rectal pain   2. Grade III hemorrhoids   3. Constipation, unspecified constipation type   4. Change in bowel habits     History of Present Illness: This is the patient's first visit to the outpatient [place] clinic.  The patient was recently evaluated in February at an urgent care facility for concerns of rectal pain and discomfort.  He was diagnosed with internal hemorrhoids.  The patient's notation had suggested some inappropriate commentary during the patient's physical exam.  The patient was treated as if there were internal hemorrhoids based on exam and he was discharged from urgent care with suppositories.  The patient has continued to have issues.  Today, he is tangential at times with his history but describes the following back history.  Issues of constipation began after eating and then possible walker where he had 2 or 3 of these.  He believes because of the amount of fiber in this and possible walkers that he ate that he initially had multiple issues of looser bowel movements.  That occurred for the next couple of days however he then developed issues of constipation.  To him, the symptomatology of constipation was hard stools that were not coming every day.  It was at that time he began to feel his hemorrhoids as he is previously had hemorrhoid issues in the past.  Because of his  chronic narcotic use for his back pain he usually uses MiraLAX once daily.  He will occasionally use softeners of stool as well.  He is a long-term vegetarian and so he is well aware of fiber that is in his diet normally.  Now that his stools have become harder he is trying to take MiraLAX on a near daily basis multiple times per day when he eats.  He is now having more significant pain.  He also has lower abdominal discomfort but he is not sure if this is a result of moving furniture at this time.  While he was using Anusol it may have helped for a little bit but it was not completely relieving.  The patient has such significant discomfort that he takes Motrin and Aleve to try and help with the pain in his rectum.  He is been given meloxicam for his report as well.  At one point in time when the constipation was so bad he actually performed digital examination himself and felt something protruding within the actual rectum up to his third finger joint.  He has had very infrequent hematochezia as a result of his hemorrhoids.  He is sexually active with women only.  He has never had any anal receptive intercourse.  He has only done a digital rectal exam on himself on one occasion within the last few weeks but never introduced anything into his anus previously.  He recalls being told by his mother that he strain for years but he has learned to minimize that over the course the last few years.  He has no  family history of any GI malignancies.  The patient has never had an upper or lower endoscopy.  GI Review of Systems Positive as above Negative for dysphagia, odynophagia, nausea, vomiting, melena, bloating  Review of Systems General: Denies fevers/chills/weight loss HEENT: Denies oral lesions Cardiovascular: Denies chest pain Pulmonary: Denies shortness of breath Gastroenterological: See HPI Genitourinary: Denies darkened urine Hematological: Denies easy bruising/bleeding Endocrine: Denies temperature  intolerance Dermatological: Denies skin changes Psychological: Mood is stable Musculoskeletal: Has chronic arthralgias   Medications Current Outpatient Medications  Medication Sig Dispense Refill   albuterol (PROVENTIL HFA;VENTOLIN HFA) 108 (90 Base) MCG/ACT inhaler Inhale 2 puffs into the lungs every 6 (six) hours as needed for wheezing or shortness of breath. 1 Inhaler 0   albuterol (PROVENTIL) (2.5 MG/3ML) 0.083% nebulizer solution Take 3 mLs (2.5 mg total) by nebulization every 6 (six) hours as needed for wheezing or shortness of breath. 150 mL 1   calcium carbonate (TUMS - DOSED IN MG ELEMENTAL CALCIUM) 500 MG chewable tablet Chew 1 tablet by mouth daily.     docusate sodium (COLACE) 100 MG capsule Take 100 mg by mouth 2 (two) times daily.     meloxicam (MOBIC) 7.5 MG tablet Take 1 tablet (7.5 mg total) by mouth daily. 30 tablet 1   polyethylene glycol powder (GLYCOLAX/MIRALAX) powder Take 17 g by mouth 2 (two) times daily as needed. 3350 g 1   hydrocortisone (ANUSOL-HC) 2.5 % rectal cream Place 1 application rectally 2 (two) times daily. 30 g 2   hydrocortisone (ANUSOL-HC) 25 MG suppository Place 1 suppository (25 mg total) rectally at bedtime. 30 suppository 1   Nitroglycerin 0.4 % OINT Apply pea size amount to rectum as directed. 1 Tube 1   No current facility-administered medications for this visit.     Allergies Allergies  Allergen Reactions   Bee Pollen Anaphylaxis and Swelling   Bee Venom Anaphylaxis and Swelling    Pt has been stung by honey bees since he became a vegetarian at age 10 yrs old with no reaction.   Other Shortness Of Breath and Itching    Reaction to cut grass, pet dander   Decadron [Dexamethasone] Itching    Reaction to injection   Shrimp [Shellfish Allergy] Swelling and Other (See Comments)    Muscle cramps   Vancomycin Itching    Reaction to IV - pt states no reaction if it is infused slowly   Wheat Bran Swelling    Some swelling    Latex Hives and Rash    Reaction to fumes from latex paint    Histories Past Medical History:  Diagnosis Date   Asthma    Cervical radiculopathy    Chronic back pain    Kidney stones    Reactive airway disease    Past Surgical History:  Procedure Laterality Date   EPIDURAL BLOCK INJECTION     SHOULDER SURGERY     Social History   Socioeconomic History   Marital status: Married    Spouse name: Not on file   Number of children: Not on file   Years of education: Not on file   Highest education level: Not on file  Occupational History   Not on file  Social Needs   Financial resource strain: Not on file   Food insecurity:    Worry: Not on file    Inability: Not on file   Transportation needs:    Medical: Not on file    Non-medical: Not on file  Tobacco Use  Smoking status: Light Tobacco Smoker    Types: Cigars   Smokeless tobacco: Never Used  Substance and Sexual Activity   Alcohol use: No    Frequency: Never    Comment:     Drug use: Yes    Types: Marijuana    Comment: Last Used November 04 2015   Sexual activity: Not on file  Lifestyle   Physical activity:    Days per week: Not on file    Minutes per session: Not on file   Stress: Not on file  Relationships   Social connections:    Talks on phone: Not on file    Gets together: Not on file    Attends religious service: Not on file    Active member of club or organization: Not on file    Attends meetings of clubs or organizations: Not on file    Relationship status: Not on file   Intimate partner violence:    Fear of current or ex partner: Not on file    Emotionally abused: Not on file    Physically abused: Not on file    Forced sexual activity: Not on file  Other Topics Concern   Not on file  Social History Narrative   Lives with partner   Has 2 sons with autism   1 daughter with ADD   Family History  Problem Relation Age of Onset   Hypertension Maternal Grandmother     Diabetes Maternal Grandmother    Cancer Maternal Grandmother    Colon cancer Neg Hx    Esophageal cancer Neg Hx    Rectal cancer Neg Hx    Stomach cancer Neg Hx    Pancreatic cancer Neg Hx    Liver disease Neg Hx    Inflammatory bowel disease Neg Hx    I have reviewed his medical, social, and family history in detail and updated the electronic medical record as necessary.    PHYSICAL EXAMINATION  BP 140/80    Pulse 94    Temp 98.3 F (36.8 C)    Ht 5\' 8"  (1.727 m)    Wt 195 lb (88.5 kg)    BMI 29.65 kg/m  Wt Readings from Last 3 Encounters:  01/28/19 195 lb (88.5 kg)  01/13/19 194 lb (88 kg)  10/19/18 198 lb 6.4 oz (90 kg)  GEN: Anxious, appears stated age, doesn't appear chronically ill PSYCH: Cooperative, mildly pressured speech at times EYE: Conjunctivae pink, sclerae anicteric ENT: MMM, without oral ulcers, no erythema or exudates noted NECK: Supple CV: RR without R/Gs  RESP: CTAB posteriorly, without wheezing GI: NABS, soft, protuberant, mild tenderness to palpation in the lower quadrants bilaterally (exacerbated by rotational movement), without rebound or guarding, no HSM appreciated GU: DRE shows small external hemorrhoids as well as internal prolapsed hemorrhoids, no perianal fistula noted, digital rectal exam suggests a tight turn anal sphincter without appreciable mass up to 4 cm as result of patient's uncomfortable unable to perform complete digital rectal, anoscopy showed evidence of multiple columns of internal hemorrhoids but was not able to be fully placed within the anal rectal canal due to patient on comfort ability MSK/EXT: No lower extremity edema SKIN: No jaundice NEURO:  Alert & Oriented x 3, no focal deficits   REVIEW OF DATA  I reviewed the following data at the time of this encounter:  GI Procedures and Studies  No relevant studies to review  Laboratory Studies  Reviewed in epic and care everywhere  Imaging Studies  No  relevant studies to  review   ASSESSMENT  Mr. Kilker is a 48 y.o. male with a pmh significant for active airway disease/asthma, chronic back pain (on opioid therapy), nephrolithiasis, cervical radiculopathy, hemorrhoids, tobacco use.  The patient is seen today for evaluation and management of:  1. Rectal pain   2. Grade III hemorrhoids   3. Constipation, unspecified constipation type   4. Change in bowel habits    This is a hemodynamically stable patient who has persistent rectal discomfort that on physical exam suggests issues of internal and external hemorrhoids playing a role.  With that being said, his change in bowel habits over the course of the last few weeks and the significance of his discomfort does suggest a need for more urgent evaluation even in the COVID-19 pandemic.  I would like to get some blood work to evaluate for metabolic changes or issues leading to constipation.  I also like to get some inflammatory markers for the setting of whether this could be, although no overt urgency symptoms, a new diagnosis of rectal proctitis in addition to hemorrhoidal disease that is exacerbating symptoms.  I do think that he will require a more urgent lower endoscopic evaluation not only to get him up-to-date for colon cancer screening before diagnostic purposes of his rectal pain.  I am going to try and optimize his bowel habits.  We are going to give the patient Linzess 72 mcg to begin initiating on a daily basis.  While taking the Linzess he may discontinue/hold the MiraLAX.  He should continue to use stool softeners (docusate 100 mg once to twice daily).  I will begin the patient on Anusol suppositories to be taken at nighttime and I will initiate the patient on Anusol hydrocortisone cream which he can use 1-2 times daily.  We want to minimize his overall steroids to his rectum/perianal region because there is a chance that this can thin the perineal skin.  He may continue witch hazel pads.  I would like him to start sitz  bath's 1-2 times daily.  He already has good H2O intake and he will continue that and he is a vegetarian so is very good with his fiber.  We will proceed with a diagnostic colonoscopy.  Further evaluation and management to be dictated by that.  I have asked him to try and minimize his nonsteroidal use as much as possible.The risks and benefits of endoscopic evaluation were discussed with the patient; these include but are not limited to the risk of perforation, infection, bleeding, missed lesions, lack of diagnosis, severe illness requiring hospitalization, as well as anesthesia and sedation related illnesses.  The patient is agreeable to proceed.  During the patient's visit, he did remark on the perception of men and women in regards to anal intercourse and I asked the patient to try and hold his comments and remain focused on him trying to get better at which point he stopped these remarks.  All patient questions were answered, to the best of my ability, and the patient agrees to the aforementioned plan of action with follow-up as indicated.    PLAN  Laboratories as outlined below Initiate Anusol suppositories nightly Initiate hydrocortisone cream 1-2 times daily  initiate Linzess 72 mcg (samples given) Continue stool softeners 1-2 times daily Continue H2O intake Minimize long-periods of sitting as able and move every couple of hours to prevent situated status Minimize straining Sitz Baths 1-2x daily Witch Hazel Pads Endoscopic evaluation is necessary due to significance of symptoms  to rule out other disease/disorders/obstructive lesions Colorectal surgery evaluation for band ligation vs hemorrhoidal resection may be required Offered Nitroglycerin 0.4% ointment though no overt fissure to attempt aid in discomfort May use Recti-care for now   Orders Placed This Encounter  Procedures   CBC   Basic Metabolic Panel (BMET)   TSH   Sedimentation rate   CRP High sensitivity   Calcium,  ionized   Ambulatory referral to Gastroenterology    New Prescriptions   HYDROCORTISONE (ANUSOL-HC) 2.5 % RECTAL CREAM    Place 1 application rectally 2 (two) times daily.   HYDROCORTISONE (ANUSOL-HC) 25 MG SUPPOSITORY    Place 1 suppository (25 mg total) rectally at bedtime.   NITROGLYCERIN 0.4 % OINT    Apply pea size amount to rectum as directed.   Modified Medications   No medications on file    Planned Follow Up: No follow-ups on file.   Justice Britain, MD [facility] Office # [phone]

## 2019-01-29 LAB — CALCIUM, IONIZED: Calcium, Ion: 5.4 mg/dL (ref 4.8–5.6)

## 2019-01-31 ENCOUNTER — Other Ambulatory Visit: Payer: Self-pay

## 2019-01-31 ENCOUNTER — Encounter: Payer: Self-pay | Admitting: Gastroenterology

## 2019-01-31 DIAGNOSIS — R194 Change in bowel habit: Secondary | ICD-10-CM

## 2019-01-31 DIAGNOSIS — K5900 Constipation, unspecified: Secondary | ICD-10-CM | POA: Insufficient documentation

## 2019-01-31 DIAGNOSIS — K6289 Other specified diseases of anus and rectum: Secondary | ICD-10-CM | POA: Insufficient documentation

## 2019-01-31 DIAGNOSIS — K642 Third degree hemorrhoids: Secondary | ICD-10-CM | POA: Insufficient documentation

## 2019-01-31 NOTE — Progress Notes (Unsigned)
Cc

## 2019-02-09 ENCOUNTER — Telehealth: Payer: Self-pay | Admitting: Gastroenterology

## 2019-02-09 NOTE — Telephone Encounter (Signed)
The pt has used nitroglycerin ointment as prescribed but it causes him to have a severe headache for about 4 hours.  He was advised to stop the nitro and use the anusol supp and cream and call back if he does not improve with this regimen.  FYI Dr Rush Landmark

## 2019-02-09 NOTE — Telephone Encounter (Signed)
Pt stated that nitroglycerin cream has been giving him side effects.  Please call back to discuss.

## 2019-02-10 ENCOUNTER — Encounter

## 2019-02-10 ENCOUNTER — Encounter: Payer: Medicaid Other | Attending: Physical Medicine & Rehabilitation | Admitting: Psychology

## 2019-02-10 ENCOUNTER — Other Ambulatory Visit: Payer: Self-pay

## 2019-02-10 ENCOUNTER — Encounter: Payer: Self-pay | Admitting: Psychology

## 2019-02-10 DIAGNOSIS — M545 Low back pain: Secondary | ICD-10-CM | POA: Insufficient documentation

## 2019-02-10 DIAGNOSIS — G8929 Other chronic pain: Secondary | ICD-10-CM | POA: Insufficient documentation

## 2019-02-10 DIAGNOSIS — M542 Cervicalgia: Secondary | ICD-10-CM | POA: Insufficient documentation

## 2019-02-10 DIAGNOSIS — G8921 Chronic pain due to trauma: Secondary | ICD-10-CM | POA: Diagnosis not present

## 2019-02-10 DIAGNOSIS — F4310 Post-traumatic stress disorder, unspecified: Secondary | ICD-10-CM | POA: Diagnosis not present

## 2019-02-10 DIAGNOSIS — S4292XS Fracture of left shoulder girdle, part unspecified, sequela: Secondary | ICD-10-CM | POA: Insufficient documentation

## 2019-02-10 DIAGNOSIS — M79672 Pain in left foot: Secondary | ICD-10-CM | POA: Insufficient documentation

## 2019-02-10 DIAGNOSIS — F17290 Nicotine dependence, other tobacco product, uncomplicated: Secondary | ICD-10-CM | POA: Insufficient documentation

## 2019-02-10 DIAGNOSIS — M79605 Pain in left leg: Secondary | ICD-10-CM | POA: Insufficient documentation

## 2019-02-10 DIAGNOSIS — R202 Paresthesia of skin: Secondary | ICD-10-CM | POA: Insufficient documentation

## 2019-02-10 DIAGNOSIS — M79641 Pain in right hand: Secondary | ICD-10-CM | POA: Insufficient documentation

## 2019-02-10 DIAGNOSIS — M5030 Other cervical disc degeneration, unspecified cervical region: Secondary | ICD-10-CM

## 2019-02-10 DIAGNOSIS — M5412 Radiculopathy, cervical region: Secondary | ICD-10-CM | POA: Insufficient documentation

## 2019-02-10 DIAGNOSIS — M797 Fibromyalgia: Secondary | ICD-10-CM

## 2019-02-10 DIAGNOSIS — M79671 Pain in right foot: Secondary | ICD-10-CM | POA: Insufficient documentation

## 2019-02-10 DIAGNOSIS — S2232XS Fracture of one rib, left side, sequela: Secondary | ICD-10-CM | POA: Insufficient documentation

## 2019-02-10 DIAGNOSIS — M79642 Pain in left hand: Secondary | ICD-10-CM | POA: Insufficient documentation

## 2019-02-10 NOTE — Progress Notes (Signed)
Neuropsychological Consultation   Patient:   Bradley Colon   DOB:   04/04/1971  MR Number:  [id number]  Location:  [facility] [place], [place] [id number] [address] [place] [address] Dept: [phone]           Date of Service:   02/10/2019  Start Time:   1 PM End Time:   2 PM  Provider/Observer:  Ilean Skill, Psy.D.       Clinical Neuropsychologist       Billing Code/Service: [phone] 2 units  Today the visit was conducted over WebEx video conferencing and lasted for 1 hour of time.  There was initial difficulty getting the audio to connect but we maintain video connection.  The audio was achieved over direct phone call while we maintained the video connection through WebEx.  Chief Complaint:    Paton Crum was referred by Dr. Letta Pate for psychological/neuropsychological interventions due to issues related to the development of chronic pain and depression and possible fibromyalgia or RSD type symptoms.  The patient is continuing to have difficulty with significant pain symptoms and significant issues of depression.  Reason for Service:  The patient was involved in a work related injury in 2013. The patient has had ongoing physical limitations and disability as well as significant ongoing pain since this severe injury. 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 He was working on a boat at [employer] yard when the owner of the boat yard was trying to move another boat with a bowed forklift that could raise a boat up to 40 feet high. The individual operating the forklift tried to move the boat with the Boat raised to its height.  The boat fell off the forklift or rales the forklift to fall down with the boat striking the patient in the left shoulder and twisting his neck and back and causing significant  injury and to his shoulder. The patient reports that he was able to see the boat as it was beginning to fall on him. He reports that when he closes his eyes he still sees the boat and the crane falling towards them and thinking to himself how he needed to hope it did not land on his head and crush them and kill them. The patient experienced significant left shoulder injury as well as broken ribs, injured back. He reports that this took an extensively long time to heal and that he still has chronic pain from this injury.  The patient had significant therapies in the past including physical therapy and epidural injections as well as work on both lower back and higher in his neck. The patient had a MRI of both his cervical spine as well as his back in 2017.  The patient reports that he received treatment primarily on his neck injury in his not receive the same type of treatment for his lumbar spine difficulties. The patient describes intermittent tingling in his hands and his feet as well as times where he'll have a burning sensation or feeling like water is running down his back or legs and also experienced numbness in his legs. The patient reports he is unable to sleep effectively due to neck pain. He reports whenever he holds his head still for any length of time that he will experience significant pain with any movement.   The above reason for service  has been reviewed and remains applicable for the current visit.  The patient is continuing to struggle with depressive symptoms that are having a negative impact on his ability to get physical activity and movement.  The patient is describing memory issues, anhedonia, lethargy, and significant pain.  The patient continues to have some issues related to late effects of posttraumatic stress disorder.  Current Status:  The patient reports he is continuing to have significant issues related to PTSD symptoms but more noteworthy are issues of continued issues of  depression and chronic pain symptoms.   Behavioral Observation: Sadao Weyer  presents as a 48 y.o.-year-old Right African American Male who appeared his stated age. his dress was Appropriate and he was Well Groomed and his manners were Appropriate to the situation.  his participation was indicative of Appropriate, Attentive and impulsive behaviors.  There were  physical disabilities indicated by gait and posture and changes in posture while sitting.  he displayed an appropriate level of cooperation and motivation.     Interactions:    Active Appropriate  Attention:   abnormal and attention span appeared shorter than expected for age  Memory:   within normal limits; recent and remote memory intact  Visuo-spatial:  within normal limits  Speech (Volume):  normal  Speech:   rapid; normal  Thought Process:  Coherent, Relevant and Tangential  Though Content:  WNL; not suicidal  Orientation:   person, place, time/date and situation  Judgment:   Good  Planning:   Fair  impulsive  Affect:    Anxious, Depressed and Labile  Mood:    Anxious and Depressed  Insight:   Good  Intelligence:   high  Marital Status/Living: The patient was born and raised in [place]. He is single but 6 children. He lives by himself.  Current Employment: The patient is not currently working and is been injured since 2013.  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  Substance Use:  No concerns of substance abuse are reported.    Education:   11th grade  Medical History:   Past Medical History:  Diagnosis Date  . Asthma   . Cervical radiculopathy   . Chronic back pain   . Kidney stones   . Reactive airway disease            Abuse/Trauma History: The patient experienced a significant traumatic event in 2013. 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 This individual is trying to transport the boat with the forklift extended to it's highest range.  THe wight of the boat caused it to tip over and fall on patient.  The boat impacted the patient's head, neck and shoulder.  The patient reports that he sees this event happening over and over whenever he closes his eyes.  Psychiatric History:  The patient has had significant difficulty adjusting to and dealing with the significant impact this injury is had on his financial life as well as his overall functioning and physical functioning. The patient had no prior psychiatric history.  Family Med/Psych History:  Family History  Problem Relation Age of Onset  . Hypertension Maternal Grandmother   . Diabetes Maternal Grandmother   . Cancer Maternal Grandmother   . Colon cancer Neg Hx   . Esophageal cancer Neg Hx   . Rectal cancer Neg Hx   .  Stomach cancer Neg Hx   . Pancreatic cancer Neg Hx   . Liver disease Neg Hx   . Inflammatory bowel disease Neg Hx     Risk of Suicide/Violence: low the patient denies any suicidal or homicidal ideation.  Impression/DX:  Diar Berkel is a 48 year old male that was referred by Dr. Letta Pate for psychological/neuropsychological consultation. The patient describes increasing symptoms of anxiety and depression with loss of functioning and chronic pain symptoms. There may be the development of RSD symptoms as well or fibromyalgia-like symptoms. The patient acknowledges significant sleep deprivation which is likely playing a role in some of these symptoms as well. The patient has had documented issues with his cervical region a recent MRI studies were complicated by movement artifact. The patient describes symptoms consistent with PTSD related to flashbacks and avoidance behaviors. There also issues of increasing and progressive mood disturbance either related to major depressive disorder or a mood disorder secondary to his general  medical condition.  The patient returns after a long time away after our first visit.  The patient reports that he has had a lot of stress related to his girlfriend and their children as well as residual PTSD symptoms from his injuries.  The patient reports that he is constantly feeling in danger and feeling like something is going to fall on him or kill him.  The patient reports that the stressors with his family heightened this feeling of being in constant danger.  We have continue to work on the symptoms.   Disposition/Plan:  The patient is to call the office tomorrow afternoon to set up another follow-up appointment to continue to work on relationship to his depression and chronic pain condition and his PTSD symptoms.  Diagnosis:    Fibromyalgia  DDD (degenerative disc disease), cervical  Chronic pain due to trauma  Post traumatic stress disorder (PTSD)         Electronically Signed   _______________________ Ilean Skill, Psy.D.

## 2019-02-10 NOTE — Telephone Encounter (Signed)
I am sorry to hear about the NTG ointment causing that issue. I agree with the further assessment in regards to the suppository and creams. Thank you. GM

## 2019-02-12 NOTE — Telephone Encounter (Signed)
done

## 2019-02-14 ENCOUNTER — Telehealth: Payer: Self-pay | Admitting: *Deleted

## 2019-02-14 ENCOUNTER — Telehealth: Payer: Self-pay | Admitting: Gastroenterology

## 2019-02-14 DIAGNOSIS — C20 Malignant neoplasm of rectum: Secondary | ICD-10-CM

## 2019-02-14 NOTE — Telephone Encounter (Signed)
Pt wife called and stated that husband wasn't able to find the doculax in the store what else can he take in place for that. She also needs some advise on how her husband is to take the linzess samples tht was giving to him.

## 2019-02-14 NOTE — Telephone Encounter (Signed)
Covid-19 travel screening questions  Have you traveled in the last 14 days? No If yes where?  Do you now or have you had a fever in the last 14 days? No  Do you have any respiratory symptoms of shortness of breath or cough now or in the last 14 days? No  Do you have any family members or close contacts with diagnosed or suspected Covid-19? No       

## 2019-02-15 ENCOUNTER — Other Ambulatory Visit: Payer: Self-pay

## 2019-02-15 ENCOUNTER — Telehealth: Payer: Self-pay | Admitting: Gastroenterology

## 2019-02-15 ENCOUNTER — Ambulatory Visit (AMBULATORY_SURGERY_CENTER): Payer: Medicaid Other | Admitting: Gastroenterology

## 2019-02-15 ENCOUNTER — Other Ambulatory Visit (INDEPENDENT_AMBULATORY_CARE_PROVIDER_SITE_OTHER): Payer: Medicaid Other

## 2019-02-15 ENCOUNTER — Encounter: Payer: Self-pay | Admitting: Gastroenterology

## 2019-02-15 VITALS — BP 110/50 | HR 55 | Temp 98.8°F | Resp 18 | Ht 68.0 in | Wt 195.0 lb

## 2019-02-15 DIAGNOSIS — K5900 Constipation, unspecified: Secondary | ICD-10-CM | POA: Diagnosis not present

## 2019-02-15 DIAGNOSIS — K6289 Other specified diseases of anus and rectum: Secondary | ICD-10-CM | POA: Diagnosis not present

## 2019-02-15 DIAGNOSIS — R194 Change in bowel habit: Secondary | ICD-10-CM

## 2019-02-15 DIAGNOSIS — K642 Third degree hemorrhoids: Secondary | ICD-10-CM

## 2019-02-15 DIAGNOSIS — C20 Malignant neoplasm of rectum: Secondary | ICD-10-CM

## 2019-02-15 LAB — SEDIMENTATION RATE: Sed Rate: 15 mm/hr (ref 0–15)

## 2019-02-15 LAB — HIGH SENSITIVITY CRP: CRP, High Sensitivity: 4.9 mg/L (ref 0.000–5.000)

## 2019-02-15 MED ORDER — FENTANYL CITRATE (PF) 100 MCG/2ML IJ SOLN
25.0000 ug | Freq: Once | INTRAMUSCULAR | Status: AC
  Administered 2019-02-15: 25 ug via INTRAVENOUS

## 2019-02-15 MED ORDER — OXYCODONE-ACETAMINOPHEN 5-325 MG PO TABS: 1.0000 | ORAL_TABLET | ORAL | 0 refills | Status: AC | PRN

## 2019-02-15 MED ORDER — SODIUM CHLORIDE 0.9 % IV SOLN: 500.0000 mL | Freq: Once | INTRAVENOUS | Status: DC

## 2019-02-15 MED FILL — OXYCODONE-ACETAMINOPHEN 5-3: 5-325 | 5 days supply | Qty: 30 | Fill #0

## 2019-02-15 NOTE — Op Note (Addendum)
[place] Patient Name: Bradley Colon Procedure Date: 02/15/2019 8:47 AM MRN: [id number] Endoscopist: Justice Britain , MD Age: 48 Referring MD:  Date of Birth: 05-09-1971 Gender: Male Account #: [id number] Procedure:                Colonoscopy Indications:              Hematochezia, Suspected hemorrhoids, Rectal pain,                            Incidental change in bowel habits noted Medicines:                Monitored Anesthesia Care Procedure:                Pre-Anesthesia Assessment:                           - Prior to the procedure, a History and Physical                            was performed, and patient medications and                            allergies were reviewed. The patient's tolerance of                            previous anesthesia was also reviewed. The risks                            and benefits of the procedure and the sedation                            options and risks were discussed with the patient.                            All questions were answered, and informed consent                            was obtained. Prior Anticoagulants: The patient has                            taken no previous anticoagulant or antiplatelet                            agents. ASA Grade Assessment: II - A patient with                            mild systemic disease. After reviewing the risks                            and benefits, the patient was deemed in                            satisfactory condition to undergo the procedure.  After obtaining informed consent, the colonoscope                            was passed under direct vision. Throughout the                            procedure, the patient's blood pressure, pulse, and                            oxygen saturations were monitored continuously. The                            Colonoscope was introduced through the anus and                            advanced to the 5 cm  into the ileum. The                            colonoscopy was performed without difficulty. The                            patient tolerated the procedure. The quality of the                            bowel preparation was good. The terminal ileum,                            ileocecal valve, appendiceal orifice, and rectum                            were photographed. Scope In: 9:04:44 AM Scope Out: 9:28:46 AM Scope Withdrawal Time: 0 hours 21 minutes 34 seconds  Total Procedure Duration: 0 hours 24 minutes 2 seconds  Findings:                 The digital rectal exam findings include palpable                            rectal mass-lesion, non-thrombosed external                            hemorrhoids and non-thrombosed internal hemorrhoids.                           The terminal ileum and ileocecal valve appeared                            normal.                           Normal mucosa was found in the recto-sigmoid colon,                            in the sigmoid colon, in the descending colon, at  the splenic flexure, in the transverse colon, at                            the hepatic flexure, in the ascending colon and in                            the cecum.                           A frond-like/villous, fungating, infiltrative and                            ulcerated non-obstructing large mass was found in                            the distal rectum extending into the mid rectum.                            The mass was partially circumferential (involving                            one-third of the lumen circumference). The mass                            measured five cm in length (from 4 cm to 9 cm from                            the anal verge. Milld oozing was present with what                            looked to be a large vessel in the middle of the                            mass/lesion. Biopsies were taken with a cold                             forceps for histology. Area proximal to the mass                            was tattooed with an injection of Spot (carbon                            black).                           Non-bleeding non-thrombosed external and internal                            hemorrhoids were found during perianal exam and                            during digital exam. The hemorrhoids were Grade II                            (  internal hemorrhoids that prolapse but reduce                            spontaneously). Complications:            No immediate complications. Estimated Blood Loss:     Estimated blood loss was minimal. Impression:               - Palpable rectal mass-lesion, non-thrombosed                            external hemorrhoids and non-thrombosed internal                            hemorrhoids found on digital rectal exam.                           - The examined portion of the ileum was normal.                           - Normal mucosa in the recto-sigmoid colon, in the                            sigmoid colon, in the descending colon, at the                            splenic flexure, in the transverse colon, at the                            hepatic flexure, in the ascending colon and in the                            cecum.                           - Likely malignant tumor in the distal rectum to                            the mid rectum. Patient with long standing                            constipation, an abnormal Sterocal ulceration could                            also be considered. Biopsied. Tattooed proximally.                           - Non-bleeding non-thrombosed external and internal                            hemorrhoids. Recommendation:           - The patient will be observed post-procedure,                            until all discharge criteria are met.                           -  Discharge patient to home.                           - Patient has a contact number  available for                            emergencies. The signs and symptoms of potential                            delayed complications were discussed with the                            patient. Return to normal activities tomorrow.                            Written discharge instructions were provided to the                            patient.                           - Resume previous diet.                           - Continue present medications.                           - Will send a short course of Oxycodone to patient                            to try and help with his discomfort, though he                            knows this is a limited basis and if necessary will                            need to discuss things further with his primary.                           - Await pathology results which will be rushed.                           - Although unlikely, we should also send HIV                            Antibody as well as a RPR test.                           - Will proceed with ordering                            CT-Chest/Abdomen/Pelvis to be done within the next                            week.  Patient will then likely need Pelvic MRI for                            further staging.                           - Will need surgical referral no matter what, even                            if biopsies are non-malignant.                           - Repeat colonoscopy in 1 year for surveillance                            based on pathology results and therapies.                           - The findings and recommendations were discussed                            with the patient. Justice Britain, MD 02/15/2019 9:42:09 AM

## 2019-02-15 NOTE — Progress Notes (Signed)
After discussion with patient, I have gone ahead and prescribed Percocet 5/325 for a 5-day course to be used up to every 4 hours. This is only a short term medication to get the patient through the recent procedure and then he can restart his prior medication regimen. I have asked him to try and minimize NSAIDs so that he does not develop NSAID induced ulcerations. Patient understands further medications will need to come through his PCP. Holding on scheduling a CT-Scan for at least 1-week to get results of his biopsies back.  Justice Britain, MD [facility] Office # [phone]

## 2019-02-15 NOTE — Patient Instructions (Signed)
Rectal mass tattooed and biopsied today.  Dr Rush Landmark will give you more information when he talks to you today. Coupon given for RectiCare the highest OTC numbing agent for rectal use.  Pain medicine was given in Recovery Room. CT contrast, 2 bottles given with blank instruction sheet.  Nurse from Dr Mansouraty's office will call you with dates and times.   YOU HAD AN ENDOSCOPIC PROCEDURE TODAY AT [facility]:   Refer to the procedure report that was given to you for any specific questions about what was found during the examination.  If the procedure report does not answer your questions, please call your gastroenterologist to clarify.  If you requested that your care partner not be given the details of your procedure findings, then the procedure report has been included in a sealed envelope for you to review at your convenience later.  YOU SHOULD EXPECT: Some feelings of bloating in the abdomen. Passage of more gas than usual.  Walking can help get rid of the air that was put into your GI tract during the procedure and reduce the bloating. If you had a lower endoscopy (such as a colonoscopy or flexible sigmoidoscopy) you may notice spotting of blood in your stool or on the toilet paper. If you underwent a bowel prep for your procedure, you may not have a normal bowel movement for a few days.  Please Note:  You might notice some irritation and congestion in your nose or some drainage.  This is from the oxygen used during your procedure.  There is no need for concern and it should clear up in a day or so.  SYMPTOMS TO REPORT IMMEDIATELY:   Following lower endoscopy (colonoscopy or flexible sigmoidoscopy):  Excessive amounts of blood in the stool  Significant tenderness or worsening of abdominal pains  Swelling of the abdomen that is new, acute  Fever of 100F or higher  For urgent or emergent issues, a gastroenterologist can be reached at any hour by calling [phone]  [phone].   DIET:  We do recommend a small meal at first, but then you may proceed to your regular diet.  Drink plenty of fluids but you should avoid alcoholic beverages for 24 hours.  ACTIVITY:  You should plan to take it easy for the rest of today and you should NOT DRIVE or use heavy machinery until tomorrow (because of the sedation medicines used during the test).    FOLLOW UP: Our staff will call the number listed on your records the next business day following your procedure to check on you and address any questions or concerns that you may have regarding the information given to you following your procedure. If we do not reach you, we will leave a message.  However, if you are feeling well and you are not experiencing any problems, there is no need to return our call.  We will assume that you have returned to your regular daily activities without incident.  If any biopsies were taken you will be contacted by phone or by letter within the next 1-3 weeks.  Please call us at [phone] if you have not heard about the biopsies in 3 weeks.    SIGNATURES/CONFIDENTIALITY: You and/or your care partner have signed paperwork which will be entered into your electronic medical record.  These signatures attest to the fact that that the information above on your After Visit Summary has been reviewed and is understood.  Full responsibility of the confidentiality of this discharge  information lies with you and/or your care-partner. 

## 2019-02-15 NOTE — Progress Notes (Signed)
Pt's states no medical or surgical changes since previsit or office visit.  Did update today.   CRNA and Dr. Carmin Muskrat are aware that the patient smoked marijuana at midnight.

## 2019-02-15 NOTE — Progress Notes (Signed)
PT taken to PACU. Monitors in place. VSS. Report given to RN. 

## 2019-02-15 NOTE — Telephone Encounter (Signed)
Pt just completed colonoscopy and requested pain medication to be sent to [facility].  Pt is there now.

## 2019-02-15 NOTE — Progress Notes (Signed)
Very talkative and constantly changing position to try and resolve pain.  Dr Rush Landmark in to talk to pt about results.

## 2019-02-15 NOTE — Telephone Encounter (Signed)
This is being handled by endo unit upstairs.

## 2019-02-15 NOTE — Progress Notes (Signed)
Called to room to assist during endoscopic procedure.  Patient ID and intended procedure confirmed with present staff. Received instructions for my participation in the procedure from the performing physician.  

## 2019-02-16 ENCOUNTER — Telehealth: Payer: Self-pay | Admitting: *Deleted

## 2019-02-16 LAB — RPR: RPR Ser Ql: NONREACTIVE

## 2019-02-16 LAB — HIV ANTIBODY (ROUTINE TESTING W REFLEX): HIV 1&2 Ab, 4th Generation: NONREACTIVE

## 2019-02-16 NOTE — Telephone Encounter (Signed)
  Follow up Call-  Call back number 02/15/2019  Post procedure Call Back phone  # [phone]  Permission to leave phone message No  Some recent data might be hidden     Patient questions:  Do you have a fever, pain , or abdominal swelling? no Pain Score  no  Have you tolerated food without any problems? no  Have you been able to return to your normal activities? no  Do you have any questions about your discharge instructions: Diet   no Medications  no Follow up visit  no  Do you have questions or concerns about your Care? no  Actions: * If pain score is 4 or above:

## 2019-02-17 ENCOUNTER — Other Ambulatory Visit: Payer: Self-pay

## 2019-02-17 ENCOUNTER — Telehealth: Payer: Self-pay | Admitting: Gastroenterology

## 2019-02-17 ENCOUNTER — Encounter: Payer: Self-pay | Admitting: Gastroenterology

## 2019-02-17 DIAGNOSIS — C20 Malignant neoplasm of rectum: Secondary | ICD-10-CM

## 2019-02-17 NOTE — Telephone Encounter (Signed)
I have called and spoke with Stacy at [facility] and she will call the pt and get him set up with CT scan.

## 2019-02-17 NOTE — Telephone Encounter (Signed)
I called and spoke with the patient on 02/16/2019 in the afternoon. I discussed with him the results of his colonoscopy biopsies which returned rush as we have asked for. They are diagnostic of rectal adenocarcinoma. The patient was taking a back even though I have told him that was my most significant concern based on the findings of the colonoscopy and the mass/lesion. The patient was very anxious over the phone and did not seem willing to accept that this was the possibility of having rectal cancer. For the next 15 minutes we discussed his history once again which we had reviewed previously during his actual visit in clinic as well as his attempts at trying to be as healthy as possible. He does except that on an occasion which we also asked had previously discussed that he had done a digital evaluation himself to try and feel what was going on and is concerned whether this really is not rectal cancer but a ulceration or tear from what he had previously. I told him, that family history and symptoms are not always present for long periods of time or even present at all in regards to family history for patients who are diagnosed with colon/rectal cancer. At this point in time, the patient asked whether we could have pathology we review the slides. I told the patient that I would be fine to reach out to pathology and that he would have to be willing to accept a possible secondary chart if another pathologist were to re-review the slides and he was willing to accept that. I will reach out to pathology to see if they can move forward with an intradepartmental consultation of pathology once again.  I fully trust the pathologists read in regards to the findings but as this is patient centered and initiated I will ask pathology for their help. The next steps in his evaluation include a pelvic abdominal CT as well as a CT chest which should be done with IV and p.o. contrast to ideally optimize staging.  He will  also need a pelvic MRI for further staging of rectal cancer. The patient was adamant that he does not want to have chemotherapy or radiation but would rather consider surgery if that can be done or doing nothing at all. After another 5 minutes of discussion we decided to move forward with the imaging studies and move forward with referrals once we have completed those studies. We will move forward with placing orders and getting those scheduled in the next couple of weeks and then as soon as they have been completed I will present his case at [facility].  An oncology referral was deferred by the patient currently until we get imaging studies and the pathology being we reviewed.  I told him that may continue to push out potential therapies and increase likelihood of other issues but that is what he has chosen at this point in time as he is sure he is not wanting to have chemotherapy. I told him he should have an opportunity to talk with the surgeons and the oncologist and the radiation oncologist to decide his final options rather than just blankly saying he would not want to have therapy and he is willing to consider that. At this point in time after 25 minutes on the phone we agreed with the plan of action as noted above. Patty or covering RN please proceed with a CT chest abdomen pelvis with IV/p.o. contrast.  He should then be scheduled for a pelvic  MRI to be done after the cross-sectional imaging with CT has been completed. Hold on oncology referral for now based on patient preference. I will reach out to pathology in the next couple of days to see if we can have an intradepartmental review of the slides.  Justice Britain, MD [facility] Advanced [facility]oscopy Office # [phone]

## 2019-02-23 ENCOUNTER — Other Ambulatory Visit: Payer: Self-pay

## 2019-02-23 ENCOUNTER — Ambulatory Visit (INDEPENDENT_AMBULATORY_CARE_PROVIDER_SITE_OTHER)
Admission: RE | Admit: 2019-02-23 | Discharge: 2019-02-23 | Disposition: A | Discharge status: Home / Self Care | Payer: Medicaid Other | Source: Ambulatory Visit | Attending: Gastroenterology | Admitting: Gastroenterology

## 2019-02-23 DIAGNOSIS — C20 Malignant neoplasm of rectum: Secondary | ICD-10-CM | POA: Diagnosis not present

## 2019-02-23 MED ORDER — IOHEXOL 300 MG/ML  SOLN
100.0000 mL | Freq: Once | INTRAMUSCULAR | Status: AC | PRN
  Administered 2019-02-23: 100 mL via INTRAVENOUS

## 2019-02-24 ENCOUNTER — Other Ambulatory Visit: Payer: Self-pay

## 2019-02-24 DIAGNOSIS — C20 Malignant neoplasm of rectum: Secondary | ICD-10-CM

## 2019-02-24 MED ORDER — OMEPRAZOLE 40 MG PO CPDR: 40.0000 mg | DELAYED_RELEASE_CAPSULE | Freq: Every day | ORAL | 3 refills | Status: DC

## 2019-02-24 NOTE — Addendum Note (Signed)
Addended by: Marlon Pel on: 02/24/2019 10:15 AM   Modules accepted: Orders

## 2019-02-24 NOTE — Telephone Encounter (Signed)
Patient is scheduled for tomorrow at [facility] MRI on 02/25/19 7:30 arrival for 8:00.  He is notified to be NPO after midnight.  He is also notified to pick up rx tomorrow after MRI

## 2019-02-24 NOTE — Telephone Encounter (Signed)
I called and spoke with the patient about the results of the CT chest/abdomen/pelvis. No significant evidence of disease outside of the rectum.  There are some pulmonary nodules that will need follow-up by his primary care provider. We are going to move forward with a pelvic MRI as the final staging for his rectal adenocarcinoma. I would like to try and get this within the next week that I can discuss his case at multidisciplinary conference next week or 2. I will relay this to my nursing staff to help with initiating. Patient is also going to restart low-dose nitroglycerin to help with his discomfort.  We are going to send a prescription for a PPI 20 mg once daily (I am okay with omeprazole or pantoprazole or as omeprazole once daily) and we will send a prescription to the [facility] long pharmacy.  The reason for this is that he is using nonsteroidals to help with his discomfort and I would like to give some cyto-protection to the stomach lining if he continues to use that medicine for pain alleviation. Patient appreciative for his care.  Justice Britain, MD [facility] Advanced [facility]oscopy Office # [phone]

## 2019-02-25 ENCOUNTER — Ambulatory Visit (HOSPITAL_COMMUNITY)
Admission: RE | Admit: 2019-02-25 | Discharge: 2019-02-25 | Disposition: A | Discharge status: Home / Self Care | Payer: Medicaid Other | Source: Ambulatory Visit | Attending: Gastroenterology | Admitting: Gastroenterology

## 2019-02-25 ENCOUNTER — Other Ambulatory Visit: Payer: Self-pay

## 2019-02-25 DIAGNOSIS — C20 Malignant neoplasm of rectum: Secondary | ICD-10-CM | POA: Insufficient documentation

## 2019-02-25 MED ORDER — GADOBUTROL 1 MMOL/ML IV SOLN
9.0000 mL | Freq: Once | INTRAVENOUS | Status: AC | PRN
  Administered 2019-02-25: 9 mL via INTRAVENOUS

## 2019-02-28 ENCOUNTER — Telehealth: Payer: Self-pay | Admitting: Gastroenterology

## 2019-02-28 NOTE — Telephone Encounter (Signed)
I called and spoke with the patient about the results of the MRI pelvis. 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 The patient was audibly concerned about this finding since nothing had been seen on cross-sectional CT/chest/abdomen/pelvis. He continues to be adamant that he would not want chemotherapy or radiation but is willing to consider the route of surgery. I have spoken with him on multiple occasions and reiterated that the standard of care may be something that surgery would not be upfront considered for him but he needs to have an opportunity to talk with someone to hear his options. He is willing to move forward with oncology and surgery referrals. I am going to try to present his case this coming Wednesday at multidisciplinary conference if not this week next week. Patient appreciative for call back and update.  Justice Britain, MD [facility] Advanced [facility] Office # [phone]

## 2019-03-01 ENCOUNTER — Telehealth: Payer: Self-pay

## 2019-03-01 ENCOUNTER — Other Ambulatory Visit: Payer: Self-pay

## 2019-03-01 DIAGNOSIS — C20 Malignant neoplasm of rectum: Secondary | ICD-10-CM

## 2019-03-01 NOTE — Telephone Encounter (Signed)
Called patient to introduce myself and role of navigator. After lengthy conversation, patient has agreed to meet with medical oncology, radiation oncology and surgeon. Patient has my contact information for questions or concerns. Patient will need close navigation.

## 2019-03-01 NOTE — Progress Notes (Signed)
Sent message to Dr. Johney Maine advising of urgent referral.

## 2019-03-01 NOTE — Telephone Encounter (Signed)
Oncology referral made and referral and records sent to [facility]

## 2019-03-02 ENCOUNTER — Encounter: Payer: Self-pay | Admitting: Surgery

## 2019-03-02 ENCOUNTER — Telehealth: Payer: Self-pay

## 2019-03-02 ENCOUNTER — Telehealth: Payer: Self-pay | Admitting: Gastroenterology

## 2019-03-02 DIAGNOSIS — C20 Malignant neoplasm of rectum: Secondary | ICD-10-CM | POA: Insufficient documentation

## 2019-03-02 DIAGNOSIS — J45909 Unspecified asthma, uncomplicated: Secondary | ICD-10-CM | POA: Insufficient documentation

## 2019-03-02 DIAGNOSIS — Z789 Other specified health status: Secondary | ICD-10-CM | POA: Insufficient documentation

## 2019-03-02 DIAGNOSIS — G8929 Other chronic pain: Secondary | ICD-10-CM

## 2019-03-02 DIAGNOSIS — F4323 Adjustment disorder with mixed anxiety and depressed mood: Secondary | ICD-10-CM | POA: Insufficient documentation

## 2019-03-02 DIAGNOSIS — G8921 Chronic pain due to trauma: Secondary | ICD-10-CM | POA: Insufficient documentation

## 2019-03-02 NOTE — Telephone Encounter (Signed)
I called this afternoon to speak with the patient to update him about our multidisciplinary conference earlier today. I discussed this case and we reviewed his endoscopic evaluation, radiologic evaluation and decided to move forward with next steps in potential therapeutic management. Standard of care would be for the patient to have opportunity to be evaluated by oncology, surgery, radiation oncology. These referrals have been placed and scheduled appointments are in place as well. I called to update the patient about this and he once again is adamant that radiation and chemotherapy are not going to be a part of his therapeutic management plan for his rectal cancer. After talking with him and listening to him for 20 minutes the patient states that there is no information or data that he can be given that would cause him to have a desire to undergo chemo or radiation as it has caused issues with family members including his grandmother as well as friends. I reiterated on multiple occasions that the standard of care would often necessitate having the opportunity to have chemo and radiation as neoadjuvant prior to surgical planning. I also discussed with the patient on multiple occasions that if he chooses not to undergo this potential route without at least hearing the options in him being able to make an informed decision that his own life expectancy may be significantly shortened. He is incredibly frustrated by the situation and although I wish I had the ability to make things easier for him I do not think that upfront surgery would be considered in his particular stage of rectal cancer. However, again I am not an oncologist or surgeon but I think him having opportunity to keep his appointments and have candid discussions with the oncologic multidisciplinary team is the right approach. He agreed to move forward with this. We will be here to try and support him as best we can however, we cannot approach  his care without having data to drive potential therapeutic options. I wished him the best and we will talk with him soon. This is for documentation purposes of our conversation.  Justice Britain, MD [facility] Office # [phone]

## 2019-03-02 NOTE — Telephone Encounter (Signed)
Called patient to advise of appointment with Dr. Benay Spice 2/29 @ 10:30 AM. He is aware of visitation restriction and the need to arrive 30 minutes early to register. Also advised of WebEx appointment with Dr. Lisbeth Renshaw on 5/5 @ 1:30 Patient is also scheduled to see Dr. Johney Maine on 5/4. Patient has my contact information for questions or concerns. We had a lengthy conversation about his fears regarding chemo and radiation. Will continue to support and follow.

## 2019-03-03 ENCOUNTER — Inpatient Hospital Stay: Payer: Medicaid Other | Attending: Oncology | Admitting: Oncology

## 2019-03-03 ENCOUNTER — Telehealth: Payer: Self-pay | Admitting: Gastroenterology

## 2019-03-03 ENCOUNTER — Telehealth: Payer: Self-pay

## 2019-03-03 NOTE — Telephone Encounter (Signed)
The pt referral to [facility] for second opinion faxed to [facility] at [phone].

## 2019-03-03 NOTE — Telephone Encounter (Signed)
Patty, he is going to keep his Surgery Oncology visit with Dr. Johney Maine next week, but would like to set up another opinion as well as he is wanting to try and avoid Radiation and Chemotherapy if possible. Please proceed with a [facility] referral (Thacker/Migaly/Mantyh/Moore) any of these surgeons would be my preference for their Colorectal experience. Thank you. GM

## 2019-03-03 NOTE — Progress Notes (Signed)
GI Location of Tumor / Histology: Rectal Cancer  Bradley Colon presented with rectal pain and constipation after eating a impossible whopper. Having some pain in his bottom.  Plan: Patient requesting second opinion at [facility], referral was made.  MRI Pelvis 02/25/2019: Rectal Adenocarcinoma T3c N2.  2 left perirectal lymph nodes, largest measuring 9 mm.  2 enlarged right internal iliac nodes, largest measuring 1.4 cm.  CT CAP 02/23/2019: Thickening of the low rectum without discrete mass appreciated.  Multiple small bilateral pulmonary nodules.  No definite evidence of metastatic disease in the chest, abdomen, or pelvis.  Colonoscopy 02/15/2019: Palpable rectal mass-lesion, non-thrombosed external hemorrhoids and non-thrombosed internal hemorrhoids found on digital rectal exam.  Likely malignant tumor in the distal rectum to the mid rectum.  Biopsies of Rectum 02/15/2019   Past/Anticipated interventions by gastroenterology, if any: Dr. Rush Landmark 03/02/2019 -I discussed this case and we reviewed his endoscopic evaluation, radiologic evaluation and decided to move forward with next steps in potential therapeutic management. -Standard of care would be for the patient to have opportunity to be evaluated by oncology, surgery, radiation oncology. -These referrals have been placed and scheduled appointments are in place as well. -I called the patient to update the patient about this and he once again is adamant that radiation and chemotherapy are not going to be part of his therapeutic management plan for his rectal cancer. -After talking with the patient he states there is no information or data that he can be given that would cause him to have a desire to undergo chemo or radiation as it has caused issues with family members including his grandmother as well as friends. -He is incredibly frustrated by the situation and although I wish I had the ability to make things easier for him I do not think that upfront  surgery would be considered in his particular stage of rectal cancer. - I think him having opportunity to keep his appointments and have candid discussions with the oncologic multidisciplinary team is the right approach.  He agreed to move forward with this.   Past/Anticipated interventions by surgeon, if any:  Dr. Johney Maine   Past/Anticipated interventions by medical oncology, if any:  Missed appointment with Dr. Benay Spice  Weight changes, if any: Lost a few pounds, stopped eating certain foods.  Bowel/Bladder complaints, if any:  Had a full size stool couple days ago.  Nausea / Vomiting, if any: No  Pain issues, if any: Left butt cheek  Any blood per rectum: No  SAFETY ISSUES:  Prior radiation? No  Pacemaker/ICD? No  Possible current pregnancy? N/A   Is the patient on methotrexate? No  Current Complaints/Details: -Getting a second opinion at [facility]

## 2019-03-03 NOTE — Telephone Encounter (Signed)
Pt would like a referral to [facility] GI Surgical Oncology. He stated that he wants to second opinion as he is trying to avoid radiation. Any questions please contact pt.

## 2019-03-03 NOTE — Telephone Encounter (Signed)
Mr. Bradley Colon did not show for initial consult with Dr. Benay Spice this morning. I called him and he is adamant that he is being "railroaded". I again relayed conference recommendation and reiterated what Dr. Rush Landmark has been telling him. I also shared the importance of meeting with Dr. Benay Spice to help understand the specifics of his cancer, treatment recommendations and to better understand what the consequences of no treatment might look like.  He hung up on me. He has my direct contact information for questions or concerns.

## 2019-03-07 ENCOUNTER — Ambulatory Visit: Payer: Self-pay | Admitting: Surgery

## 2019-03-08 ENCOUNTER — Ambulatory Visit
Admission: RE | Admit: 2019-03-08 | Discharge: 2019-03-08 | Disposition: A | Discharge status: Home / Self Care | Payer: Medicaid Other | Source: Ambulatory Visit | Attending: Radiation Oncology | Admitting: Radiation Oncology

## 2019-03-08 ENCOUNTER — Other Ambulatory Visit: Payer: Self-pay

## 2019-03-08 ENCOUNTER — Encounter: Payer: Self-pay | Admitting: Radiation Oncology

## 2019-03-08 VITALS — Ht 68.0 in | Wt 192.0 lb

## 2019-03-08 DIAGNOSIS — C20 Malignant neoplasm of rectum: Secondary | ICD-10-CM

## 2019-03-08 NOTE — Progress Notes (Signed)
Radiation Oncology         ([phone]) [phone] ________________________________  Name: Bradley Colon        MRN: [id number]  Date of Service: 03/08/2019 DOB: 1970/11/25  VQ:[id number], Bradley Sage, FNP  Bradley Pier, MD     REFERRING PHYSICIAN: Ladell Pier, MD   DIAGNOSIS: The encounter diagnosis was Rectal cancer [facility]).   HISTORY OF PRESENT ILLNESS: Bradley Colon is a 48 y.o. male seen at the request of Dr. Rush Colon for newly diagnosed rectal cancer.  He was evaluated in February 2020 and an urgent care facility for concerns with rectal pain and discomfort and was diagnosed with internal hemorrhoids.  Apparently he had been experiencing constipation as well.  He was evaluated by GI in mid March, and underwent colonoscopy on 02/15/2019.  This revealed a palpable rectal mass on digital exam as well as a frond-like villous fungating infiltrative and ulcerated nonobstructing mass in the distal rectum extending into the mid rectum it was partially circumferential involving one third of the luminal circumference measuring 5 cm in length with mild oozing present.  There were nonbleeding nonthrombosed external and internal hemorrhoids as well classified as grade 2.  Biopsies obtained at that time revealed adenocarcinoma consistent with colorectal primary.  He underwent CT of the chest abdomen and pelvis on 02/23/2019 revealing thickening in the low rectum without discrete mass, multiple bilateral pulmonary nodules measuring 5 mm in greatest dimension felt to be inflammatory, and an MRI of the pelvis on 02/25/2019 states she is cancer as T3cN2 with 2 enlarged right internal iliac nodes the largest measuring 14 mm.  Because of these findings he is been referred to Dr. Johney Colon, Dr. Benay Colon, and Dr. Lisbeth Colon.  Of note he has been resistant to consider chemo and radiation and his case has been discussed in GI conference.  He is also having a second opinion at [facility] to consider upfront surgery which he has been advised against.   He is seen via WebEx encounter today to discuss the role of chemoradiation.  PREVIOUS RADIATION THERAPY: No   PAST MEDICAL HISTORY:  Past Medical History:  Diagnosis Date   Aggressiveness 12/28/2018   Asthma    Cervical radiculopathy    Chronic back pain    Kidney stones    Reactive airway disease        PAST SURGICAL HISTORY: Past Surgical History:  Procedure Laterality Date   EPIDURAL BLOCK INJECTION     SHOULDER SURGERY       FAMILY HISTORY:  Family History  Problem Relation Age of Onset   Hypertension Maternal Grandmother    Diabetes Maternal Grandmother    Cancer Maternal Grandmother    Colon cancer Neg Hx    Esophageal cancer Neg Hx    Rectal cancer Neg Hx    Stomach cancer Neg Hx    Pancreatic cancer Neg Hx    Liver disease Neg Hx    Inflammatory bowel disease Neg Hx      SOCIAL HISTORY:  reports that he has been smoking cigars. He has never used smokeless tobacco. He reports current drug use. Drug: Marijuana. He reports that he does not drink alcohol.  The patient is married and resides in [place].  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    ALLERGIES: Bee pollen; Bee venom; Other; Decadron [dexamethasone]; Shrimp [shellfish allergy]; Vancomycin; Wheat bran; and Latex   MEDICATIONS:  Current Outpatient Medications  Medication Sig  Dispense Refill   albuterol (PROVENTIL HFA;VENTOLIN HFA) 108 (90 Base) MCG/ACT inhaler Inhale 2 puffs into the lungs every 6 (six) hours as needed for wheezing or shortness of breath. 1 Inhaler 0   albuterol (PROVENTIL) (2.5 MG/3ML) 0.083% nebulizer solution Take 3 mLs (2.5 mg total) by nebulization every 6 (six) hours as needed for wheezing or shortness of breath. 150 mL 1   calcium carbonate (TUMS - DOSED IN MG ELEMENTAL CALCIUM) 500 MG chewable tablet Chew 1 tablet by mouth daily.     docusate sodium (COLACE) 100 MG capsule Take 100 mg by mouth 2  (two) times daily.     hydrocortisone (ANUSOL-HC) 2.5 % rectal cream Place 1 application rectally 2 (two) times daily. 30 g 2   hydrocortisone (ANUSOL-HC) 25 MG suppository Place 1 suppository (25 mg total) rectally at bedtime. 30 suppository 1   meloxicam (MOBIC) 7.5 MG tablet Take 1 tablet (7.5 mg total) by mouth daily. 30 tablet 1   Nitroglycerin 0.4 % OINT Apply pea size amount to rectum as directed. (Patient not taking: Reported on 02/15/2019) 1 Tube 1   omeprazole (PRILOSEC) 40 MG capsule Take 1 capsule (40 mg total) by mouth daily. 90 capsule 3   polyethylene glycol powder (GLYCOLAX/MIRALAX) powder Take 17 g by mouth 2 (two) times daily as needed. (Patient not taking: Reported on 02/15/2019) 3350 g 1   No current facility-administered medications for this encounter.      REVIEW OF SYSTEMS: On review of systems, the patient reports that he is doing well overall. He describes occasional pain in his buttock area and has hemorrhoids so describes some heaviness in the pelvis. He denies any rectal bleeding or pain with passing stool  denies any chest pain, shortness of breath, cough, fevers, chills, night sweats, unintended weight changes. He denies any bowel or bladder disturbances, and denies abdominal pain, nausea or vomiting. He denies any new musculoskeletal or joint aches or pains. A complete review of systems is obtained and is otherwise negative.     PHYSICAL EXAM:  Unable to assess vitals due to nature of encounter  In general this is a well appearing African American male in no acute distress. He's alert and oriented x4 and appropriate throughout the examination. Cardiopulmonary assessment is negative for acute distress and he exhibits normal effort.    ECOG = 1  0 - Asymptomatic (Fully active, able to carry on all predisease activities without restriction)  1 - Symptomatic but completely ambulatory (Restricted in physically strenuous activity but ambulatory and able to carry  out work of a light or sedentary nature. For example, light housework, office work)  2 - Symptomatic, <50% in bed during the day (Ambulatory and capable of all self care but unable to carry out any work activities. Up and about more than 50% of waking hours)  3 - Symptomatic, >50% in bed, but not bedbound (Capable of only limited self-care, confined to bed or chair 50% or more of waking hours)  4 - Bedbound (Completely disabled. Cannot carry on any self-care. Totally confined to bed or chair)  5 - Death   Eustace Pen MM, Creech RH, Tormey DC, et al. [phone]). "Toxicity and response criteria of the [facility]". [place] Oncol. 5 (6): 649-55    LABORATORY DATA:  Lab Results  Component Value Date   WBC 7.1 01/28/2019   HGB 14.6 01/28/2019   HCT 44.1 01/28/2019   MCV 88.3 01/28/2019   PLT 279.0 01/28/2019   Lab  Results  Component Value Date   NA 139 01/28/2019   K 4.6 01/28/2019   CL 102 01/28/2019   CO2 28 01/28/2019   Lab Results  Component Value Date   ALT 23 11/14/2016   AST 24 11/14/2016   ALKPHOS 65 11/14/2016   BILITOT 0.6 11/14/2016      RADIOGRAPHY: Ct Chest W Contrast  Result Date: 02/24/2019 CLINICAL DATA:  Rectal cancer EXAM: CT CHEST, ABDOMEN, AND PELVIS WITH CONTRAST TECHNIQUE: Multidetector CT imaging of the chest, abdomen and pelvis was performed following the standard protocol during bolus administration of intravenous contrast. CONTRAST:  173m OMNIPAQUE IOHEXOL 300 MG/ML SOLN, additional oral enteric contrast COMPARISON:  CT abdomen pelvis, 07/03/2013 FINDINGS: CT CHEST FINDINGS Cardiovascular: No significant vascular findings. Normal heart size. No pericardial effusion. Mediastinum/Nodes: No enlarged mediastinal, hilar, or axillary lymph nodes. Thyroid gland, trachea, and esophagus demonstrate no significant findings. Lungs/Pleura: Multiple small bilateral pulmonary nodules, measuring up to 5 mm in the right middle lobe (series 3, image 77).  Additional nodules include a 3 mm nodule of the left lung base (series 3, image 105), and a 3 mm nodule of the superior segment left lower lobe (series 3, image 59). No pleural effusion or pneumothorax. Musculoskeletal: No chest wall mass or suspicious bone lesions identified. CT ABDOMEN PELVIS FINDINGS Hepatobiliary: No focal liver abnormality is seen. No gallstones, gallbladder wall thickening, or biliary dilatation. Pancreas: Unremarkable. No pancreatic ductal dilatation or surrounding inflammatory changes. Spleen: Normal in size without focal abnormality. Adrenals/Urinary Tract: Adrenal glands are unremarkable. Kidneys are normal, without renal calculi, focal lesion, or hydronephrosis. Bladder is unremarkable. Stomach/Bowel: Stomach is within normal limits. Appendix appears normal. Thickening of the low rectum without discrete mass appreciated. Vascular/Lymphatic: Calcific atherosclerosis. No enlarged abdominal or pelvic lymph nodes. Reproductive: No mass or other abnormality. Other: No abdominal wall hernia or abnormality. No abdominopelvic ascites. Musculoskeletal: No acute or significant osseous findings. IMPRESSION: 1. Thickening of the low rectum without discrete mass appreciated, in keeping with known diagnosis of rectal cancer. 2. Multiple small bilateral pulmonary nodules, measuring up to 5 mm in the right middle lobe (series 3, image 77). Additional nodules include a 3 mm nodule of the left lung base (series 3, image 105), and a 3 mm nodule of the superior segment left lower lobe (series 3, image 59). Nodule of the right middle lobe and left lung base were seen on examination dated 07/03/2013 and are benign sequelae of infection or inflammation. Attention on follow-up. 3. No definite evidence of metastatic disease in the chest, abdomen, or pelvis. Electronically Signed   By: AEddie CandleM.D.   On: 02/24/2019 08:33   Mr Pelvis W WRFContrast  Result Date: 02/25/2019 CLINICAL DATA:  Newly diagnosed  rectal carcinoma.  Local staging. EXAM: MRI PELVIS WITHOUT AND WITH CONTRAST TECHNIQUE: Multiplanar multisequence MR imaging of the pelvis was performed both before and after administration of intravenous contrast. Small amount of UKoreagel was administered per rectum to optimize tumor evaluation. CONTRAST:  9 mL Gadavist COMPARISON:  CT on 02/23/2019 FINDINGS: TUMOR LOCATION Tumor distance from Anal Verge/Skin Surface:  4.9 cm Tumor distance from Internal Anal Sphincter:  0 cm TUMOR DESCRIPTION Circumferential Extent: Anterior and left lateral walls from 10-6 o'clock positions Tumor Length: 3.5 cm T - CATEGORY Extension through Muscularis Propria: Yes, along the anterior and left lateral walls, with greatest extension measuring 7 mm = T3c Shortest Distance of any tumor/node from Mesorectal Fascia: 0 mm, along anterior and left lateral wall of  inferior rectum Extramural Vascular Invasion/Tumor Thrombus: No Invasion of Anterior Peritoneal Reflection: No Involvement of Adjacent Organs or Pelvic Sidewall: No Levator Ani Involvement: No N - CATEGORY Mesorectal Lymph Nodes >=28m: 2 left perirectal lymph nodes, largest measuring 9 mm Extra-mesorectal Lymphadenopathy: 2 enlarged right internal iliac nodes, largest measuring 1.4 cm in short axis = N2 Other:  None. IMPRESSION: Rectal adenocarcinoma T stage: T3c Rectal adenocarcinoma N stage:  N2 Distance from tumor to the internal anal sphincter is 0 cm. Electronically Signed   By: JEarle GellM.D.   On: 02/25/2019 11:35   Ct Abdomen Pelvis W Contrast  Result Date: 02/24/2019 CLINICAL DATA:  Rectal cancer EXAM: CT CHEST, ABDOMEN, AND PELVIS WITH CONTRAST TECHNIQUE: Multidetector CT imaging of the chest, abdomen and pelvis was performed following the standard protocol during bolus administration of intravenous contrast. CONTRAST:  1050mOMNIPAQUE IOHEXOL 300 MG/ML SOLN, additional oral enteric contrast COMPARISON:  CT abdomen pelvis, 07/03/2013 FINDINGS: CT CHEST FINDINGS  Cardiovascular: No significant vascular findings. Normal heart size. No pericardial effusion. Mediastinum/Nodes: No enlarged mediastinal, hilar, or axillary lymph nodes. Thyroid gland, trachea, and esophagus demonstrate no significant findings. Lungs/Pleura: Multiple small bilateral pulmonary nodules, measuring up to 5 mm in the right middle lobe (series 3, image 77). Additional nodules include a 3 mm nodule of the left lung base (series 3, image 105), and a 3 mm nodule of the superior segment left lower lobe (series 3, image 59). No pleural effusion or pneumothorax. Musculoskeletal: No chest wall mass or suspicious bone lesions identified. CT ABDOMEN PELVIS FINDINGS Hepatobiliary: No focal liver abnormality is seen. No gallstones, gallbladder wall thickening, or biliary dilatation. Pancreas: Unremarkable. No pancreatic ductal dilatation or surrounding inflammatory changes. Spleen: Normal in size without focal abnormality. Adrenals/Urinary Tract: Adrenal glands are unremarkable. Kidneys are normal, without renal calculi, focal lesion, or hydronephrosis. Bladder is unremarkable. Stomach/Bowel: Stomach is within normal limits. Appendix appears normal. Thickening of the low rectum without discrete mass appreciated. Vascular/Lymphatic: Calcific atherosclerosis. No enlarged abdominal or pelvic lymph nodes. Reproductive: No mass or other abnormality. Other: No abdominal wall hernia or abnormality. No abdominopelvic ascites. Musculoskeletal: No acute or significant osseous findings. IMPRESSION: 1. Thickening of the low rectum without discrete mass appreciated, in keeping with known diagnosis of rectal cancer. 2. Multiple small bilateral pulmonary nodules, measuring up to 5 mm in the right middle lobe (series 3, image 77). Additional nodules include a 3 mm nodule of the left lung base (series 3, image 105), and a 3 mm nodule of the superior segment left lower lobe (series 3, image 59). Nodule of the right middle lobe and  left lung base were seen on examination dated 07/03/2013 and are benign sequelae of infection or inflammation. Attention on follow-up. 3. No definite evidence of metastatic disease in the chest, abdomen, or pelvis. Electronically Signed   By: AlEddie Candle.D.   On: 02/24/2019 08:33       IMPRESSION/PLAN: 1. Stage IIIA-B, cT3cN2M0 adenocarcinoma of the rectum.Dr. MoLisbeth Renshawiscusses the pathology findings and reviews the nature of rectal cancer. We reviewed the rationale for neoadjuvant chemoRT upfront, versus following total neoadjuvant chemotherapy. He has met with Dr. GrJohney Mainend [facility]  We discussed the risks, benefits, short, and long term effects of radiotherapy, and the patient is contemplating his options. He is very uncertain about the long term risks associated with treatments that he perceives to be the reason other friends and family with cancer died.  Dr. MoLisbeth Colon  discusses the delivery and logistics of radiotherapy and anticipates a course of 5 1/2 weeks of radiotherapy to the rectum and regional nodes if he agrees. We will follow up with his decision making once he has seen Dr. Benay Colon and [place].   This encounter was provided by telemedicine platform Webex.  The patient has given verbal consent for this type of encounter and has been advised to only accept a meeting of this type in a secure network environment. The time spent during this encounter was 90 minutes. The attendants for this meeting include Blenda Nicely, RN, Dr. Lisbeth Colon, Hayden Pedro  and [place].  During the encounter,  Blenda Nicely, RN, Dr. Lisbeth Colon, and Hayden Pedro were located at [facility] Radiation Oncology Department.  River Ambrosio was located at home.    The above documentation reflects my direct findings during this shared patient visit. Please see the separate note by Dr. Lisbeth Colon on this date for the remainder of the patient's plan of care.    Carola Rhine, PAC

## 2019-03-09 ENCOUNTER — Telehealth: Payer: Self-pay | Admitting: *Deleted

## 2019-03-09 ENCOUNTER — Telehealth: Payer: Self-pay | Admitting: Oncology

## 2019-03-09 NOTE — Telephone Encounter (Signed)
On 03-09-19 fax medical records to [facility] it was consult note

## 2019-03-09 NOTE — Telephone Encounter (Signed)
Pt has been rescheduled to see Dr. Benay Spice on 5/13 at 11am. Appt date and time was ok'd by MD. Pt has been notified.

## 2019-03-14 ENCOUNTER — Telehealth: Payer: Self-pay | Admitting: Gastroenterology

## 2019-03-15 ENCOUNTER — Other Ambulatory Visit: Payer: Self-pay | Admitting: *Deleted

## 2019-03-15 ENCOUNTER — Ambulatory Visit: Payer: Medicaid Other | Admitting: Oncology

## 2019-03-15 DIAGNOSIS — C20 Malignant neoplasm of rectum: Secondary | ICD-10-CM

## 2019-03-15 NOTE — Progress Notes (Signed)
Dr. Johney Maine asked GI nurse navigator to have CEA drawn at new patient visit tomorrow. Lab appointment added w/CEA/

## 2019-03-16 ENCOUNTER — Telehealth: Payer: Self-pay

## 2019-03-16 ENCOUNTER — Other Ambulatory Visit: Payer: Self-pay

## 2019-03-16 ENCOUNTER — Inpatient Hospital Stay: Payer: Medicaid Other | Attending: Oncology | Admitting: Oncology

## 2019-03-16 ENCOUNTER — Inpatient Hospital Stay: Payer: Medicaid Other

## 2019-03-16 VITALS — BP 132/87 | HR 76 | Temp 97.9°F | Resp 18 | Ht 68.0 in | Wt 190.3 lb

## 2019-03-16 DIAGNOSIS — R918 Other nonspecific abnormal finding of lung field: Secondary | ICD-10-CM

## 2019-03-16 DIAGNOSIS — G893 Neoplasm related pain (acute) (chronic): Secondary | ICD-10-CM | POA: Diagnosis not present

## 2019-03-16 DIAGNOSIS — C20 Malignant neoplasm of rectum: Secondary | ICD-10-CM

## 2019-03-16 DIAGNOSIS — J45909 Unspecified asthma, uncomplicated: Secondary | ICD-10-CM | POA: Diagnosis not present

## 2019-03-16 MED ORDER — OXYCODONE HCL 5 MG PO TABS: 5.0000 mg | ORAL_TABLET | Freq: Four times a day (QID) | ORAL | 0 refills | Status: DC | PRN

## 2019-03-16 MED FILL — oxyCODONE HCL 5 MG TABS: 5 | 8 days supply | Qty: 30 | Fill #0

## 2019-03-16 NOTE — Telephone Encounter (Signed)
Please see pt request. Pt states that he had 2 procedures ?  Done on yesterday and he is pain.

## 2019-03-16 NOTE — Telephone Encounter (Signed)
Called Bradley Colon to touch base to offer support prior to his appointment with Dr. Benay Spice. He plans to make the appointment today at 11:00 AM.

## 2019-03-16 NOTE — Progress Notes (Signed)
[facility] Patient Consult   Requesting MD: [facility]  Bradley Colon 48 y.o.  [birth year]    Reason for Consult: Rectal cancer   HPI: Bradley Colon presented in February with rectal pain attributed to "hemorrhoids ".  He has a history of hemorrhoids in the past.  He was treated for hemorrhoids.  He developed constipation and was referred to Dr. Rush Landmark.  A digital exam and anoscopy could not be completely performed secondary to discomfort.  He was taken to a colonoscopy on 02/15/2019.  A mass was palpated on digital rectal exam.  An ulcerated nonobstructing mass was found in the distal rectum extending to the mid rectum.  The mass started at 4 cm from the anal verge.  Mild oozing was present.  Biopsies were obtained and the area was tattooed.  Nonbleeding and nonthrombosed internal and external hemorrhoids were noted.  The pathology from the rectum biopsy returned as adenocarcinoma.  CTs of the chest, abdomen, pelvis on 02/23/2019 revealed multiple small bilateral pulmonary nodules including a 5 mm right middle lobe nodule.  The right middle lobe and the left lung base nodules were present on a scan from 2014.No focal liver lesion.  There is thickening of the low rectum without a discrete mass.  No lymphadenopathy.  The right middle lobe and left lung base nodules were present on his scan from 2014.  He was referred for a pelvic MRI on 02/25/2019.  A tumor was noted at 4.9 cm from the anal verge.  Tumor extended through the muscularis propria.  There are 2 left perirectal nodes and 2 enlarged right internal iliac nodes.  The tumor was staged as a T3cN2 rectal tumor beginning at 0 cm from the internal anal sphincter.   His case was presented at the GI tumor conference.  He has seen Dr. Johney Maine and Dr. Lisbeth Renshaw.  He had a second opinion appointment with medical oncology and surgery yesterday at [facility].  He reports the [facility] consultants recommend total neoadjuvant therapy.  Past  Medical History:  Diagnosis Date  . Aggressiveness 12/28/2018  . Asthma   . Cervical radiculopathy   . Chronic back pain   . Kidney stones   . Reactive airway disease-seasonal and multiple other allergens     Past Surgical History:  Procedure Laterality Date  . EPIDURAL BLOCK INJECTION    . SHOULDER SURGERY-following a work-related accident   2013    Medications: Reviewed  Allergies:  Allergies  Allergen Reactions  . Bee Pollen Anaphylaxis and Swelling  . Bee Venom Anaphylaxis and Swelling    Pt has been stung by honey bees since he became a vegetarian at age 7 yrs old with no reaction.  . Other Shortness Of Breath and Itching    Reaction to cut grass, pet dander  . Decadron [Dexamethasone] Itching    Reaction to injection  . Shrimp [Shellfish Allergy] Swelling and Other (See Comments)    Muscle cramps  . Vancomycin Itching    Reaction to IV - pt states no reaction if it is infused slowly  . Wheat Bran Swelling    Some swelling  . Latex Hives and Rash    Reaction to fumes from latex paint    Family history: His paternal grandmother had breast cancer.  His maternal grandmother had lung cancer and was a smoker.  Social History:   He lives with his girlfriend and children in [place].  He is disabled secondary to a work-related accident in 2013.  He  has used cannabis.  No cigarette use.  No alcohol use.  No transfusion history.  No risk factor for HIV or hepatitis.  ROS:   Positives include: 6 pound weight loss since being diagnosed with rectal cancer, rectal urgency, bleeding following rectal examinations, hives from multiple allergens, chronic pain in the extremities following a work-related neck injury, recent erectile dysfunction  A complete ROS was otherwise negative.  Physical Exam:  Blood pressure 132/87, pulse 76, temperature 97.9 F (36.6 C), temperature source Oral, resp. rate 18, height 5\' 8"  (1.727 m), weight 190 lb 4.8 oz (86.3 kg), SpO2 100 %.   HEENT: Neck without mass Lungs: Clear bilaterally Cardiac: Regular rate and rhythm Abdomen: No hepatosplenomegaly, no mass, nontender GU: Testes without mass Vascular: No leg edema Lymph nodes: No cervical, supraclavicular, axillary, or inguinal nodes Neurologic: Alert and oriented, the motor exam appears intact in the upper and lower extremities bilaterally Skin: No rash Musculoskeletal: No spine tenderness   LAB:  CBC  Lab Results  Component Value Date   WBC 7.1 01/28/2019   HGB 14.6 01/28/2019   HCT 44.1 01/28/2019   MCV 88.3 01/28/2019   PLT 279.0 01/28/2019   NEUTROABS 3.2 11/14/2016        CMP  Lab Results  Component Value Date   NA 139 01/28/2019   K 4.6 01/28/2019   CL 102 01/28/2019   CO2 28 01/28/2019   GLUCOSE 86 01/28/2019   BUN 20 01/28/2019   CREATININE 1.17 01/28/2019   CALCIUM 10.0 01/28/2019   PROT 7.4 11/14/2016   ALBUMIN 4.1 11/14/2016   AST 24 11/14/2016   ALT 23 11/14/2016   ALKPHOS 65 11/14/2016   BILITOT 0.6 11/14/2016   GFRNONAA 58 (L) 11/14/2016   GFRAA >60 11/14/2016     No results found for: CEA1  Imaging:  As per HPI, CT images from 02/23/2019 reviewed with Bradley Colon   Assessment/Plan:   1. Rectal cancer, clinical stage III (T3cN2)  Rectal mass extending from 4-9 cm on colonoscopy 02/15/2019 with a biopsy confirming invasive adenocarcinoma  CTs 02/23/2019- thickening of the low rectum without a discrete mass, small bilateral pulmonary nodules, some present on imaging from 2014  Pelvic MRI 02/25/2019- T3cN2 tumor beginning at 4.9 cm from the anal verge and 0 cm from the internal anal sphincter, tumor extends through the muscularis propria, 2.  Rectal lymph nodes and 2 enlarged right internal iliac nodes 2. Pain secondary to #1 3. Reactive airways disease/asthma 4. Report of multiple allergens   Disposition:   Bradley Colon has been diagnosed with locally advanced rectal cancer.  I discussed diagnosis and treatment recommendations  with him.  I reviewed the CT images with Bradley Colon.  I agree with the recommendation for total neoadjuvant therapy.  I explained the rationale for neoadjuvant therapy. We reviewed the need for Port-A-Cath placement.    I recommend 8 cycles of FOLFOX to be followed by concurrent capecitabine and radiation.  We reviewed the potential toxicities associated with the FOLFOX regimen including the chance for nausea/vomiting, mucositis, diarrhea, alopecia, and hematologic toxicity.  We discussed the rash, sun sensitivity, hand/foot syndrome, and hyperpigmentation seen with 5-fluorouracil.  We reviewed the allergic reaction and various types of neuropathy associated with oxaliplatin.  We had an extensive discussion regarding the recommendations of a multidisciplinary team.  He remains undecided on proceeding with neoadjuvant therapy.  He would not agree to referral for Port-A-Cath placement or a chemotherapy class today.  He asked my nurse to contact him on 03/17/2021.  He will decide in the interim on whether to proceed.  I will refer him for Port-A-Cath placement, a chemotherapy teaching class, and schedule FOLFOX chemotherapy within the next 1-2 weeks if he agrees.  Betsy Coder, MD  03/16/2019, 12:12 PM

## 2019-03-17 NOTE — Telephone Encounter (Signed)
It looks like oncology has sent in a prescription for the patient.

## 2019-03-18 ENCOUNTER — Telehealth: Payer: Self-pay | Admitting: *Deleted

## 2019-03-18 ENCOUNTER — Telehealth: Payer: Self-pay | Admitting: Oncology

## 2019-03-18 NOTE — Telephone Encounter (Signed)
Confirmed 5/20 nutrition telephone visit with patient. Message below from patient forwarded to Dr. Benay Spice.  Dr. Benay Spice, Bradley Colon says he is sorry about the visit yesterday and that he didn't mean to break down on you. He says he is afraid, not sleeping, mentally this is getting to him and he is just trying to wrap his head around all of this.   He also says he has some questions he is going to send you via mychart and that if there is anyway to lessen the amount of chemo he will receive he thinks he can bring himself to do it.

## 2019-03-18 NOTE — Telephone Encounter (Addendum)
Left VM requesting patient return call or leave Mychart message with his decision regarding treatment for his rectal cancer. Called patient back to discuss his decision on treatment. He is still undecided. Has concern about putting impurities (drugs) into his body. Still obsessed with why he got the cancer since he has always taken good care of his body being vegetarian and meditates, etc. Says he wants to keep his body pure/clean so he can die pure. Says he feels chemo will "destroy my spirit". Report having PTSD and sees a therapist, a Dr. Waldon Merl and has put out a message for him to contact him to help him cope with this and make a decision. Says he is tearful, anxious, gets short of breath, can't sleep with this new diagnosis. He reports he has had panic attacks like this in the past. He is not willing to make appointment to speak with education nurse yet. Said his PTSD was due to a boat up on a crane falling on him several years ago and he thinks he died and returned to life. Ever since then he has this sensation of impending doom all the time. He state he will think about it over the weekend and send Dr. Benay Spice a Mychart message with his questions and thoughts. Encouraged him to also try to think of this logically..Make a pro/con list to put things in perspective as well. Informed him if he does not seek treatment, this will progress to a point he will be in a lot of pain and could die from complications. This will be a point where he will not obtain a cure. He verbalizes understanding.

## 2019-03-21 ENCOUNTER — Encounter: Payer: Self-pay | Admitting: Oncology

## 2019-03-22 ENCOUNTER — Telehealth: Payer: Self-pay

## 2019-03-22 NOTE — Telephone Encounter (Signed)
Spoke with Mr. Bradley Colon for 40 minutes. He was tearful throughout most of the conversation. I relayed Dr. Gearldine Shown message that an option would be to proceed with Zeloda/radiation instead of Folfox first. We also processed the fear that he's dealing with and how "unfair" he feels that it is. He did verbalize that [facility]'s suggested treatment plan up to this point reflects the plan that he has received at [facility]. After much discussion he agreed to call me back after he talks to [facility]. They are presenting his case at their tumor board today. He will call me back with his decision.

## 2019-03-23 ENCOUNTER — Encounter: Payer: Self-pay | Admitting: Oncology

## 2019-03-23 ENCOUNTER — Inpatient Hospital Stay: Payer: Medicaid Other | Admitting: Nutrition

## 2019-03-23 ENCOUNTER — Telehealth: Payer: Self-pay

## 2019-03-23 NOTE — Telephone Encounter (Signed)
Called and left message asking for a call back to discuss information received from [facility].

## 2019-03-23 NOTE — Progress Notes (Signed)
RD working remotely.  Contacted patient who asked if I could call him back on Friday. He says he is just coming to terms with his diagnosis and he needs time to settle himself. He does want to talk and states, "I'll be ready on Friday." Will add patient to my schedule.

## 2019-03-24 ENCOUNTER — Telehealth: Payer: Self-pay

## 2019-03-24 ENCOUNTER — Encounter: Payer: Self-pay | Admitting: Oncology

## 2019-03-24 NOTE — Telephone Encounter (Signed)
Called Bradley Colon to ask about treatment decision. He is waiting to talk to [facility] tomorrow one last time. He did say "I've exhausted all of my ideas". He promised to call me after he speaks with [facility].

## 2019-03-25 ENCOUNTER — Inpatient Hospital Stay: Payer: Medicaid Other | Admitting: Nutrition

## 2019-03-25 NOTE — Progress Notes (Signed)
RD working remotely.  Contacted and spoke with patient. He did not want to talk at this time. I will contact him next week.

## 2019-03-29 ENCOUNTER — Ambulatory Visit: Payer: Medicaid Other | Admitting: Nutrition

## 2019-03-29 ENCOUNTER — Telehealth: Payer: Self-pay

## 2019-03-29 NOTE — Telephone Encounter (Signed)
Called patient to see if he had make a decision about treatment. He exhibits circular thinking and appears to be perseverating on getting surgery first. Treatment plan reviewed multiple times. He wants a 100% guarantee that the chemo will work with no reccurence. Advised that that is not possible.  Patient has called surgical oncologist in [place] to see if they will due surgery upfront. Will fax information if requested. Patient reporting increasing pain. Reports Dilaudid not working. He has the clinic number for questions or concerns.

## 2019-03-29 NOTE — Progress Notes (Signed)
RD working remotely.  48 year old male diagnosed with Rectal cancer. He is a patient of Dr. Benay Spice.  PMH includes Kidney stones, chronic back pain, asthma.  Medications include Tums, Colace, Prilosec, Miralax.  Labs reviewed.  Height: 68 inches Weight: 190.3 pounds. UBW: 198 pounds in dec 2019. BMI: 28.94  Patient reports he is a vegetarian and avoids dairy. He is struggling with a decision to take chemotherapy and reports he doesn't know why he got cancer. He is very sensitive to "toxins". Many questions about nutrition.  Nutrition Diagnosis:  Food and Nutrition Related Knowledge Deficit related to new diagnosis of cancer as evidenced by no prior need for nutrition information.  Intervention: Educated patient on healthy plant based diet. Encouraged protein foods as tolerated. Active listening. Questions answered and contact information provided.  Monitoring, Evaluation, Goals: Patient will tolerate adequate calories and protein to maintain lean body mass.  Next Visit: To be scheduled as needed.

## 2019-03-30 ENCOUNTER — Telehealth: Payer: Self-pay

## 2019-03-30 NOTE — Telephone Encounter (Signed)
Patient called to say that he does not want to "go out of town" for treatment because of the drive. Mentioned that [facility] was too far as well. Will not fax referral packet to Dr. Verdia Kuba in  [place], [place] as requested by patient yesterday. Today atient called to negotiate the length of chemo course and then moving surgery up. He stated "I just can't go through this."  Patient has declined a social work and spiritual care referral. After many conversations with him I'm at a loss as to how to navigate this patient. Will continue to be available for patient support and questions.

## 2019-03-31 ENCOUNTER — Telehealth: Payer: Self-pay

## 2019-03-31 NOTE — Telephone Encounter (Signed)
Have talked to Mr. Bradley Colon several times today. Detailed instructions about treatment plan provided. Folfox reviewed as well as Xeloda radiation. Discussed need for PAC if he decided to follow recommended tx plan. He voiced that he will call back later today with tx decision.

## 2019-04-04 ENCOUNTER — Other Ambulatory Visit: Payer: Self-pay | Admitting: Oncology

## 2019-04-04 NOTE — Progress Notes (Signed)
I spoke to Mr. Tetzloff by telephone at approximately 5 PM on 04/04/2019.  He indicates that he remains undecided on beginning treatment for the rectal cancer.  He says that he is most likely going to proceed with neoadjuvant radiation at [facility]. Mr. Karnes says he will let us know  his decision this week.

## 2019-04-20 ENCOUNTER — Encounter: Payer: Self-pay | Admitting: Oncology

## 2019-04-22 ENCOUNTER — Telehealth: Payer: Self-pay | Admitting: *Deleted

## 2019-04-22 NOTE — Telephone Encounter (Signed)
Patient called to discuss his recent CT scan at [facility] and possible treatment plans. He verbalizes that he looked at scan and the liver lesion Dr. Rigoberto Noel was concerned about "looked just like two other spots on my liver he said were OK". He verbalized that he did not like Dr. Rigoberto Noel and his methods of discussion with him. Then went on very long recount of how many people he has known with cancer and thinking the chemo is what hastened their death. Having a very difficult time making a decision and he sounds very anxious with rambling thoughts and rapid speech pattern. Encouraged him to keep his port placement at [facility] on 04/26/19 and offered appointment on 04/28/19 at 0800 to see Dr. Benay Spice for further discussion. He agreed to the appointment.

## 2019-04-25 ENCOUNTER — Telehealth: Payer: Self-pay | Admitting: *Deleted

## 2019-04-25 MED FILL — oxyCODONE HCL 10 MG TABS: 10 | 15 days supply | Qty: 90 | Fill #0

## 2019-04-25 NOTE — Telephone Encounter (Addendum)
Per MD request left VM to move his 6/25 appointment to 04/27/19 at 2 pm. Requested call to confirm. Called back and offered him appointment on 6/24 for 1030, 1130 or keep the 2pm. He prefers to keep the 2 pm appointment. He was at [facility] today when RN called in process of having his port placed.

## 2019-04-26 ENCOUNTER — Encounter: Payer: Self-pay | Admitting: Oncology

## 2019-04-27 ENCOUNTER — Encounter: Payer: Self-pay | Admitting: *Deleted

## 2019-04-27 ENCOUNTER — Other Ambulatory Visit: Payer: Self-pay | Admitting: *Deleted

## 2019-04-27 ENCOUNTER — Inpatient Hospital Stay: Payer: Medicaid Other | Admitting: Oncology

## 2019-04-27 ENCOUNTER — Ambulatory Visit
Admission: RE | Admit: 2019-04-27 | Discharge: 2019-04-27 | Disposition: A | Discharge status: Home / Self Care | Payer: Self-pay | Source: Ambulatory Visit | Attending: Oncology | Admitting: Oncology

## 2019-04-27 DIAGNOSIS — C20 Malignant neoplasm of rectum: Secondary | ICD-10-CM

## 2019-04-28 ENCOUNTER — Ambulatory Visit: Payer: Medicaid Other | Admitting: Oncology

## 2019-05-02 MED FILL — OxyCONTIN 20 MG T12A: 20 | 30 days supply | Qty: 60 | Fill #0

## 2019-05-08 ENCOUNTER — Encounter: Payer: Self-pay | Admitting: Oncology

## 2019-05-16 ENCOUNTER — Telehealth: Payer: Self-pay | Admitting: *Deleted

## 2019-05-16 MED FILL — OxyCONTIN 20 MG T12A: 20 | 30 days supply | Qty: 60 | Fill #0

## 2019-05-16 NOTE — Telephone Encounter (Signed)
Called patient with appointment to see Dr. Benay Spice on 05/18/19 at 2 pm. He appreciates call and agrees to come in. Called Tedra Coupe in radiology and she will contact [facility] to push over the June CT scan for Dr. Benay Spice to review w/patient.

## 2019-05-17 ENCOUNTER — Other Ambulatory Visit: Payer: Self-pay | Admitting: *Deleted

## 2019-05-17 ENCOUNTER — Ambulatory Visit
Admission: RE | Admit: 2019-05-17 | Discharge: 2019-05-17 | Disposition: A | Discharge status: Home / Self Care | Payer: Self-pay | Source: Ambulatory Visit | Attending: Oncology | Admitting: Oncology

## 2019-05-17 DIAGNOSIS — C20 Malignant neoplasm of rectum: Secondary | ICD-10-CM

## 2019-05-18 ENCOUNTER — Other Ambulatory Visit: Payer: Self-pay

## 2019-05-18 ENCOUNTER — Inpatient Hospital Stay: Payer: Medicaid Other | Attending: Oncology | Admitting: Oncology

## 2019-05-18 ENCOUNTER — Telehealth: Payer: Self-pay | Admitting: *Deleted

## 2019-05-18 VITALS — BP 149/84 | HR 58 | Temp 97.8°F | Resp 18 | Ht 68.0 in | Wt 185.9 lb

## 2019-05-18 DIAGNOSIS — K769 Liver disease, unspecified: Secondary | ICD-10-CM | POA: Diagnosis not present

## 2019-05-18 DIAGNOSIS — G893 Neoplasm related pain (acute) (chronic): Secondary | ICD-10-CM | POA: Insufficient documentation

## 2019-05-18 DIAGNOSIS — C20 Malignant neoplasm of rectum: Secondary | ICD-10-CM | POA: Diagnosis not present

## 2019-05-18 DIAGNOSIS — J45909 Unspecified asthma, uncomplicated: Secondary | ICD-10-CM | POA: Insufficient documentation

## 2019-05-18 NOTE — Progress Notes (Signed)
[facility] OFFICE PROGRESS NOTE   Diagnosis: Rectal cancer  INTERVAL HISTORY:   Mr. Lanese requested a follow-up appointment to discuss his current treatment and future treatment plans.  He began concurrent Xeloda and radiation on 04/26/2019.  He reports tolerating the treatment well.  No mouth sores, rash, or hand/foot pain.  He had episodes of diarrhea over the past week, this has resolved.  He has noted improvement in rectal pain, bleeding, and constipation.  He is also noted improvement in erectile dysfunction.  A pretreatment CT evaluation on 04/19/2019 revealed a new small suspicious lesion in the right liver.  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  They are considering adding oxaliplatiin to the treatment regimen.  Mr. Fuhs is concerned about increased side effects with the addition of oxaliplatin.  He reports malaise and dyspnea during treatment.  No chest pain or cough.  He was started on Decadron for malaise and anorexia.  The symptoms resolved.  He is now taking OxyContin once daily.  He plans to attempt not taking the OxyContin.  Objective:  Vital signs in last 24 hours:  Blood pressure (!) 149/84, pulse (!) 58, temperature 97.8 F (36.6 C), temperature source Oral, resp. rate 18, height 5\' 8"  (1.727 m), weight 185 lb 14.4 oz (84.3 kg), SpO2 100 %.   Physical examination not performed today secondary to distancing with the coded pandemic   Labs: Laboratory studies from [facility] 05/10/2019-reviewed Medications: I have reviewed the patient's current medications.   Assessment/Plan:  1. Rectal cancer, clinical stage III (T3cN2)  Rectal mass extending from 4-9 cm on colonoscopy 02/15/2019 with a biopsy confirming invasive adenocarcinoma  CTs 02/23/2019- thickening of the low rectum without a discrete mass, small bilateral pulmonary nodules, some present on imaging from 2014  Pelvic MRI 02/25/2019- T3cN2 tumor beginning at 4.9 cm from the anal  verge and 0 cm from the internal anal sphincter, tumor extends through the muscularis propria, 2.  Rectal lymph nodes and 2 enlarged right internal iliac nodes  CTs at [facility] 04/19/2019- new subcentimeter right hepatic lesion concerning for metastasis, stable low rectal mass with prominent mesorectal lymph nodes, stable pathologic enlarged right internal iliac nodes, stable pulmonary nodules  Xeloda/radiation beginning 04/26/2019 2. Pain secondary to #1 3. Reactive airways disease/asthma 4. Report of multiple allergens   Disposition: Mr. Feig is completing concurrent Xeloda and radiation at [facility].  He appears to be tolerating the treatment well.  The rectal symptoms have improved.  I discussed the current treatment, implications of the liver lesion on the 04/19/2019 CT, and future treatment options with Mr. Faircloth.  His mother was present for today's visit by telephone.  We reviewed the 04/19/2019 CT images.  The small lesion in the right liver appears to be new.  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  I recommend completing the current course of Xeloda and radiation.  He can then undergo a restaging evaluation to further evaluate the liver lesion, potentially to include an MRI or PET.  I explained the need for oxaliplatin as part of the systemic therapy regimen regardless of the liver lesion.  I would consider CAPOX or FOLFOX following a restaging evaluation.  He may be a candidate for resection of the rectal primary and liver at the completion of neoadjuvant therapy.  Mr. Yett plans to continue Xeloda and radiation at [facility].  He will return for an office visit here  at the completion of therapy.  30 minutes were spent with the patient today.  The majority of the time was used for counseling and coordination of care.  Betsy Coder, MD  05/18/2019  3:04 PM

## 2019-05-18 NOTE — Telephone Encounter (Signed)
"  Alphonzo Severance [phone]).  Trying to reach Dr. Gearldine Shown nurse to notify her I am on my way.  Will be there in about fifteen minutes.  I'm on my way from [place]."

## 2019-06-08 ENCOUNTER — Inpatient Hospital Stay: Payer: Medicaid Other | Attending: Oncology | Admitting: Oncology

## 2019-07-06 MED FILL — oxyCODONE HCL 10 MG TABS: 10 | 15 days supply | Qty: 90 | Fill #0

## 2019-07-18 ENCOUNTER — Encounter: Payer: Medicaid Other | Attending: Registered Nurse | Admitting: Registered Nurse

## 2019-07-25 MED FILL — oxyCODONE HCL 10 MG TABS: 10 | 2 days supply | Qty: 10 | Fill #0

## 2019-08-19 DIAGNOSIS — Z79899 Other long term (current) drug therapy: Secondary | ICD-10-CM | POA: Insufficient documentation

## 2019-08-23 MED FILL — LIDOCAINE 2% VISCOUS SOLN: 2 | 5 days supply | Qty: 100 | Fill #0

## 2019-08-25 ENCOUNTER — Other Ambulatory Visit: Payer: Self-pay

## 2019-08-25 ENCOUNTER — Emergency Department (HOSPITAL_COMMUNITY): Payer: Medicaid Other

## 2019-08-25 ENCOUNTER — Emergency Department (HOSPITAL_COMMUNITY)
Admission: EM | Admit: 2019-08-25 | Discharge: 2019-08-25 | Disposition: A | Discharge status: Home / Self Care | Payer: Medicaid Other | Attending: Emergency Medicine | Admitting: Emergency Medicine

## 2019-08-25 DIAGNOSIS — Z5321 Procedure and treatment not carried out due to patient leaving prior to being seen by health care provider: Secondary | ICD-10-CM | POA: Insufficient documentation

## 2019-08-25 DIAGNOSIS — R0789 Other chest pain: Secondary | ICD-10-CM | POA: Diagnosis present

## 2019-08-25 LAB — CBC
HCT: 39 % (ref 39.0–52.0)
Hemoglobin: 12.9 g/dL — ABNORMAL LOW (ref 13.0–17.0)
MCH: 31.6 pg (ref 26.0–34.0)
MCHC: 33.1 g/dL (ref 30.0–36.0)
MCV: 95.6 fL (ref 80.0–100.0)
Platelets: 228 10*3/uL (ref 150–400)
RBC: 4.08 MIL/uL — ABNORMAL LOW (ref 4.22–5.81)
RDW: 13.2 % (ref 11.5–15.5)
WBC: 5.6 10*3/uL (ref 4.0–10.5)
nRBC: 0 % (ref 0.0–0.2)

## 2019-08-25 LAB — BASIC METABOLIC PANEL
Anion gap: 4 — ABNORMAL LOW (ref 5–15)
BUN: 17 mg/dL (ref 6–20)
CO2: 26 mmol/L (ref 22–32)
Calcium: 9.1 mg/dL (ref 8.9–10.3)
Chloride: 105 mmol/L (ref 98–111)
Creatinine, Ser: 1.3 mg/dL — ABNORMAL HIGH (ref 0.61–1.24)
GFR calc Af Amer: >60 mL/min (ref >60)
GFR calc non Af Amer: >60 mL/min (ref >60)
Glucose, Bld: 87 mg/dL (ref 70–99)
Potassium: 4.3 mmol/L (ref 3.5–5.1)
Sodium: 135 mmol/L (ref 135–145)

## 2019-08-25 LAB — TROPONIN I (HIGH SENSITIVITY): Troponin I (High Sensitivity): 2 ng/L (ref <18)

## 2019-08-25 NOTE — ED Triage Notes (Signed)
Pt c/o intermittent chest pains since chemo last Friday.

## 2019-08-25 NOTE — ED Notes (Signed)
Extra tubes sent to lab - dark green, gold x 2, and blue

## 2019-08-25 NOTE — ED Notes (Signed)
Patient reports he is leaving.

## 2019-08-26 MED FILL — DRONABINOL 2.5 MG CAP: 2.5 | 30 days supply | Qty: 60 | Fill #0

## 2019-09-13 MED FILL — predniSONE 50 MG TABS: 50 | 3 days supply | Qty: 3 | Fill #0

## 2019-09-14 MED FILL — FAMOTIDINE 40 MG TABLET: 40 | 10 days supply | Qty: 10 | Fill #0

## 2019-09-23 MED FILL — ALPRAZolam 0.25 MG TABS: 0.25 | 30 days supply | Qty: 90 | Fill #0

## 2019-09-28 MED FILL — predniSONE 50 MG TABS: 50 | 3 days supply | Qty: 3 | Fill #0

## 2019-09-30 ENCOUNTER — Emergency Department (HOSPITAL_COMMUNITY)
Admission: EM | Admit: 2019-09-30 | Discharge: 2019-09-30 | Disposition: A | Discharge status: Home / Self Care | Payer: Medicaid Other | Attending: Emergency Medicine | Admitting: Emergency Medicine

## 2019-09-30 ENCOUNTER — Other Ambulatory Visit: Payer: Self-pay

## 2019-09-30 ENCOUNTER — Encounter (HOSPITAL_COMMUNITY): Payer: Self-pay

## 2019-09-30 ENCOUNTER — Emergency Department (HOSPITAL_BASED_OUTPATIENT_CLINIC_OR_DEPARTMENT_OTHER): Payer: Medicaid Other

## 2019-09-30 ENCOUNTER — Emergency Department (HOSPITAL_COMMUNITY): Payer: Medicaid Other

## 2019-09-30 DIAGNOSIS — F17290 Nicotine dependence, other tobacco product, uncomplicated: Secondary | ICD-10-CM | POA: Insufficient documentation

## 2019-09-30 DIAGNOSIS — Z79899 Other long term (current) drug therapy: Secondary | ICD-10-CM | POA: Insufficient documentation

## 2019-09-30 DIAGNOSIS — M79601 Pain in right arm: Secondary | ICD-10-CM | POA: Diagnosis present

## 2019-09-30 DIAGNOSIS — I82C11 Acute embolism and thrombosis of right internal jugular vein: Secondary | ICD-10-CM | POA: Insufficient documentation

## 2019-09-30 DIAGNOSIS — Z91040 Latex allergy status: Secondary | ICD-10-CM | POA: Diagnosis not present

## 2019-09-30 DIAGNOSIS — Z20828 Contact with and (suspected) exposure to other viral communicable diseases: Secondary | ICD-10-CM | POA: Diagnosis not present

## 2019-09-30 DIAGNOSIS — J45909 Unspecified asthma, uncomplicated: Secondary | ICD-10-CM | POA: Insufficient documentation

## 2019-09-30 DIAGNOSIS — R52 Pain, unspecified: Secondary | ICD-10-CM

## 2019-09-30 LAB — BASIC METABOLIC PANEL
Anion gap: 8 (ref 5–15)
BUN: 18 mg/dL (ref 6–20)
CO2: 25 mmol/L (ref 22–32)
Calcium: 9.1 mg/dL (ref 8.9–10.3)
Chloride: 109 mmol/L (ref 98–111)
Creatinine, Ser: 1.17 mg/dL (ref 0.61–1.24)
GFR calc Af Amer: >60 mL/min (ref >60)
GFR calc non Af Amer: >60 mL/min (ref >60)
Glucose, Bld: 101 mg/dL — ABNORMAL HIGH (ref 70–99)
Potassium: 4.5 mmol/L (ref 3.5–5.1)
Sodium: 142 mmol/L (ref 135–145)

## 2019-09-30 LAB — SARS CORONAVIRUS 2 (TAT 6-24 HRS): SARS Coronavirus 2: NEGATIVE

## 2019-09-30 LAB — D-DIMER, QUANTITATIVE: D-Dimer, Quant: 0.87 ug/mL-FEU — ABNORMAL HIGH (ref 0.00–0.50)

## 2019-09-30 MED ORDER — APIXABAN 5 MG PO TABS
10.0000 mg | ORAL_TABLET | Freq: Two times a day (BID) | ORAL | Status: DC
  Filled 2019-09-30: qty 2

## 2019-09-30 MED ORDER — HYDROCODONE-ACETAMINOPHEN 5-325 MG PO TABS: 1.0000 | ORAL_TABLET | Freq: Once | ORAL | Status: DC

## 2019-09-30 MED ORDER — OXYCODONE HCL 5 MG PO TABS: 5.0000 mg | ORAL_TABLET | Freq: Four times a day (QID) | ORAL | 0 refills | Status: AC | PRN

## 2019-09-30 MED ORDER — APIXABAN 5 MG PO TABS: 5.0000 mg | ORAL_TABLET | Freq: Two times a day (BID) | ORAL | 0 refills | Status: DC

## 2019-09-30 MED ORDER — APIXABAN 5 MG PO TABS: 5.0000 mg | ORAL_TABLET | Freq: Two times a day (BID) | ORAL | Status: DC

## 2019-09-30 MED FILL — ELIQUIS 5 MG TABLET: 5 | 30 days supply | Qty: 60 | Fill #0

## 2019-09-30 MED FILL — oxyCODONE HCL 5 MG TABS: 5 | 3 days supply | Qty: 12 | Fill #0

## 2019-09-30 NOTE — ED Provider Notes (Signed)
[facility] EMERGENCY DEPARTMENT Provider Note   CSN: [id number] Arrival date & time: 09/30/19  1122     History   Chief Complaint Chief Complaint  Patient presents with  . Vascular Access Problem    HPI Bradley Colon is a 48 y.o. male.     HPI Patient with a history of ongoing therapy for rectal cancer presents with concern of right upper chest discomfort. Patient is a chemotherapy port in this area. He notes that recently he has been more irritated by sensation of this port, as well as with a heavy sensation in the right arm. No new fever, other chest pain, dyspnea, nausea, vomiting. Patient is not currently receiving chemotherapy, is scheduled to return to his oncology center in 4 days for CT scan with anticipation of surgery soon thereafter. Today after speaking with his oncology center at [facility], he was referred here for evaluation. He notes that he does take narcotics regularly for relief of his rectal pain, but symptoms of his arm discomfort, chest discomfort are not appreciably changed by this. He has previously gone to an about emergency department, but left prior to being evaluated. Past Medical History:  Diagnosis Date  . Aggressiveness 12/28/2018  . Asthma   . Cervical radiculopathy   . Chronic back pain   . Kidney stones   . Reactive airway disease     Patient Active Problem List   Diagnosis Date Noted  . Rectal cancer ([place]) 03/02/2019  . Reactive airway disease 03/02/2019  . Vegetarian 03/02/2019  . Adjustment disorder with mixed anxiety and depressed mood 03/02/2019  . 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 Change in bowel habits 01/31/2019  . Constipation 01/31/2019  . Grade III hemorrhoids 01/31/2019  . Rectal pain 01/31/2019  . Fibromyalgia 12/18/2017  . Dysesthesia affecting both sides of body 12/18/2017  . Spondylosis without myelopathy or radiculopathy, lumbar region 12/18/2017  . Chronic post-traumatic  stress disorder (PTSD) 05/25/2017  . Other fatigue 07/27/2016  . Chronic left shoulder pain 07/22/2016  . Chronic neck pain 11/19/2015  . Chronic low back pain 09/18/2014    Past Surgical History:  Procedure Laterality Date  . EPIDURAL BLOCK INJECTION    . SHOULDER SURGERY          Home Medications    Prior to Admission medications   Medication Sig Start Date End Date Taking? Authorizing Provider  albuterol (PROVENTIL HFA;VENTOLIN HFA) 108 (90 Base) MCG/ACT inhaler Inhale 2 puffs into the lungs every 6 (six) hours as needed for wheezing or shortness of breath. Patient not taking: Reported on 03/08/2019 10/19/18   Scot Jun, FNP  apixaban (ELIQUIS) 5 MG TABS tablet Take 1 tablet (5 mg total) by mouth 2 (two) times daily. 09/30/19 10/30/19  Carmin Muskrat, MD  calcium carbonate (TUMS - DOSED IN MG ELEMENTAL CALCIUM) 500 MG chewable tablet Chew 1 tablet by mouth daily.    [provider]  capecitabine (XELODA) 500 MG tablet Take 1,500 mg by mouth 2 (two) times a day. M_F with radiation 03/29/19   [provider]  dexamethasone (DECADRON) 4 MG tablet Take 4 mg by mouth daily. 04/26/19   [provider]  docusate sodium (COLACE) 100 MG capsule Take 100 mg by mouth daily.     [provider]  omeprazole (PRILOSEC) 40 MG capsule Take 1 capsule (40 mg total) by mouth daily. Patient not taking: Reported on 03/08/2019 02/24/19   Mansouraty, Telford Nab., MD  ondansetron [facility]) 8 MG  tablet Take 8 mg by mouth every 12 (twelve) hours as needed. 04/26/19   [provider]  oxyCODONE (OXY IR/ROXICODONE) 5 MG immediate release tablet Take 1 tablet (5 mg total) by mouth every 6 (six) hours as needed for up to 3 days for severe pain. 09/30/19 10/03/19  Carmin Muskrat, MD  polyethylene glycol powder (GLYCOLAX/MIRALAX) powder Take 17 g by mouth 2 (two) times daily as needed. 10/19/18   Scot Jun, FNP  prochlorperazine (COMPAZINE) 10 MG tablet  Take 10 mg by mouth every 6 (six) hours as needed. 04/26/19   [provider]    Family History Family History  Problem Relation Age of Onset  . Hypertension Maternal Grandmother   . Diabetes Maternal Grandmother   . Lung cancer Maternal Grandfather   . Breast cancer Paternal Grandmother   . Colon cancer Neg Hx   . Esophageal cancer Neg Hx   . Rectal cancer Neg Hx   . Stomach cancer Neg Hx   . Pancreatic cancer Neg Hx   . Liver disease Neg Hx   . Inflammatory bowel disease Neg Hx     Social History Social History   Tobacco Use  . Smoking status: Light Tobacco Smoker    Types: Cigars  . Smokeless tobacco: Never Used  Substance Use Topics  . Alcohol use: No    Frequency: Never    Comment:    . Drug use: Yes    Types: Marijuana    Comment: States marijuana at 12 midnight     Allergies   Bee pollen, Bee venom, Other, Decadron [dexamethasone], Shrimp [shellfish allergy], Vancomycin, Wheat bran, and Latex   Review of Systems Review of Systems  Constitutional:       Per HPI, otherwise negative  HENT:       Per HPI, otherwise negative  Respiratory:       Per HPI, otherwise negative  Cardiovascular:       Per HPI, otherwise negative  Gastrointestinal: Negative for vomiting.  Endocrine:       Negative aside from HPI  Genitourinary:       Neg aside from HPI   Musculoskeletal:       Per HPI, otherwise negative  Skin: Negative.   Allergic/Immunologic: Positive for immunocompromised state.  Neurological: Negative for syncope.     Physical Exam Updated Vital Signs BP (!) 149/82   Pulse (!) 53   Temp 97.6 F (36.4 C) (Oral)   Resp 16   Ht 5\' 8"  (1.727 m)   Wt 97.5 kg   SpO2 100%   BMI 32.69 kg/m   Physical Exam Vitals signs and nursing note reviewed.  Constitutional:      General: He is not in acute distress.    Appearance: He is well-developed.  HENT:     Head: Normocephalic and atraumatic.  Eyes:     Conjunctiva/sclera: Conjunctivae  normal.  Cardiovascular:     Rate and Rhythm: Normal rate and regular rhythm.  Pulmonary:     Effort: Pulmonary effort is normal. No respiratory distress.     Breath sounds: No stridor.  Abdominal:     General: There is no distension.  Musculoskeletal:     Comments: No gross asymmetry of his upper extremities, no limited range of motion of the right shoulder, though he describes a heavy sensation in the arm.  Skin:    General: Skin is warm and dry.  Neurological:     Mental Status: He is alert and oriented to  person, place, and time.      ED Treatments / Results  Labs (all labs ordered are listed, but only abnormal results are displayed) Labs Reviewed  D-DIMER, QUANTITATIVE (NOT AT [facility]) - Abnormal; Notable for the following components:      Result Value   D-Dimer, Quant 0.87 (*)    All other components within normal limits  BASIC METABOLIC PANEL - Abnormal; Notable for the following components:   Glucose, Bld 101 (*)    All other components within normal limits  SARS CORONAVIRUS 2 (TAT 6-24 HRS)     Radiology Dg Chest 2 View  Result Date: 09/30/2019 CLINICAL DATA:  Pt arrives POV for eval of pain surrounding his port. Pt has port for chemo/CA treatment. Pt reports that the pain started about 2 weeks ago, reports pain around tunneled catheter in neck as well. Port is not red, warm to touch o.*comment was truncated*R upper chest pain around chemo-port EXAM: CHEST - 2 VIEW COMPARISON:  08/25/2019 FINDINGS: Port in the anterior chest wall with tip in distal SVC. Normal cardiac silhouette. No effusion, infiltrate or pneumothorax. No acute osseous abnormality. IMPRESSION: No acute cardiopulmonary process. Electronically Signed   By: Suzy Bouchard M.D.   On: 09/30/2019 12:18   Ue Venous Duplex (mc & Wl 7 Am - 7 Pm)  Result Date: 09/30/2019 UPPER VENOUS STUDY  Indications: Pain Comparison Study: No prior study. Performing Technologist: Maudry Mayhew MHA, RDMS, RVT, RDCS   Examination Guidelines: A complete evaluation includes B-mode imaging, spectral Doppler, color Doppler, and power Doppler as needed of all accessible portions of each vessel. Bilateral testing is considered an integral part of a complete examination. Limited examinations for reoccurring indications may be performed as noted.  Right Findings: +----------+------------+---------+-----------+----------+-------+ RIGHT     CompressiblePhasicitySpontaneousPropertiesSummary +----------+------------+---------+-----------+----------+-------+ IJV           None                 No                Acute  +----------+------------+---------+-----------+----------+-------+ Subclavian    Full                 Yes                      +----------+------------+---------+-----------+----------+-------+ Axillary      Full                 Yes                      +----------+------------+---------+-----------+----------+-------+ Brachial      Full                 Yes                      +----------+------------+---------+-----------+----------+-------+ Radial        Full                                          +----------+------------+---------+-----------+----------+-------+ Ulnar         Full                                          +----------+------------+---------+-----------+----------+-------+ Cephalic      Full                                          +----------+------------+---------+-----------+----------+-------+  Basilic       Full                                          +----------+------------+---------+-----------+----------+-------+  Left Findings: +----------+------------+---------+-----------+----------+-------+ LEFT      CompressiblePhasicitySpontaneousPropertiesSummary +----------+------------+---------+-----------+----------+-------+ Subclavian               Yes       Yes                       +----------+------------+---------+-----------+----------+-------+  Summary:  Right: Findings consistent with acute deep vein thrombosis involving the right internal jugular vein.  Left: No evidence of thrombosis in the subclavian.  *See table(s) above for measurements and observations.    Preliminary     Procedures Procedures (including critical care time)  Medications Ordered in ED Medications  HYDROcodone-acetaminophen (NORCO/VICODIN) 5-325 MG per tablet 1 tablet (has no administration in time range)     Initial Impression / Assessment and Plan / ED Course  I have reviewed the triage vital signs and the nursing notes.  Pertinent labs & imaging results that were available during my care of the patient were reviewed by me and considered in my medical decision making (see chart for details).    Discussed patient's ultrasound findings with our vascular colleagues, recommendation is for anticoagulation, no indication for emergent intervention, heparin, hospitalization.    4:26 PM On repeat exam patient is in no distress, I discussed findings with his wife via telephone. With concern for thrombosis of his internal jugular vein, patient will require initiation of anticoagulant.  Patient not amenable to Lovenox shots, he is amenable to oral anticoagulant, though he understands the initiation of this may slow his possible surgery scheduling.  We discussed importance of following up with his physicians at [facility] given concern for this thrombosis, and absent other complaints, with no distress, no evidence for hemodynamic compromise, the patient is appropriate for outpatient follow-up.  Final Clinical Impressions(s) / ED Diagnoses   Final diagnoses:  Thrombosis of right internal jugular vein [facility])    ED Discharge Orders         Ordered    apixaban (ELIQUIS) 5 MG TABS tablet  2 times daily     09/30/19 1625    oxyCODONE (OXY IR/ROXICODONE) 5 MG immediate release tablet  Every 6  hours PRN     09/30/19 1625           Carmin Muskrat, MD 09/30/19 1627

## 2019-09-30 NOTE — Discharge Instructions (Signed)
As discussed, it is important that you follow-up with your physicians at [facility] for appropriate ongoing management of your thrombosis of the internal jugular vein on the right side, with consideration of your chemotherapy port playing a role in this.  Return here for concerning changes in your condition.   Information on my medicine - ELIQUIS (apixaban)  Why was Eliquis prescribed for you? Eliquis was prescribed to treat blood clots that may have been found in the veins of your legs (deep vein thrombosis) or in your lungs (pulmonary embolism) and to reduce the risk of them occurring again.  What do You need to know about Eliquis ? The starting dose is 10 mg (two 5 mg tablets) taken TWICE daily for the FIRST SEVEN (7) DAYS, then on 10/07/2019 the EVENING DOSE  the dose is reduced to ONE 5 mg tablet taken TWICE daily.  Eliquis may be taken with or without food.   Try to take the dose about the same time in the morning and in the evening. If you have difficulty swallowing the tablet whole please discuss with your pharmacist how to take the medication safely.  Take Eliquis exactly as prescribed and DO NOT stop taking Eliquis without talking to the doctor who prescribed the medication.  Stopping may increase your risk of developing a new blood clot.  Refill your prescription before you run out.  After discharge, you should have regular check-up appointments with your healthcare provider that is prescribing your Eliquis.    What do you do if you miss a dose? If a dose of ELIQUIS is not taken at the scheduled time, take it as soon as possible on the same day and twice-daily administration should be resumed. The dose should not be doubled to make up for a missed dose.  Important Safety Information A possible side effect of Eliquis is bleeding. You should call your healthcare provider right away if you experience any of the following: ? Bleeding from an injury or your nose that does  not stop. ? Unusual colored urine (red or dark brown) or unusual colored stools (red or black). ? Unusual bruising for unknown reasons. ? A serious fall or if you hit your head (even if there is no bleeding).  Some medicines may interact with Eliquis and might increase your risk of bleeding or clotting while on Eliquis. To help avoid this, consult your healthcare provider or pharmacist prior to using any new prescription or non-prescription medications, including herbals, vitamins, non-steroidal anti-inflammatory drugs (NSAIDs) and supplements.  This website has more information on Eliquis (apixaban): http://www.eliquis.com/eliquis/home

## 2019-09-30 NOTE — Progress Notes (Signed)
ANTICOAGULATION CONSULT NOTE - Initial Consult  Pharmacy Consult for apixaban Indication: IJ DVT  Allergies  Allergen Reactions  . Bee Pollen Anaphylaxis and Swelling  . Bee Venom Anaphylaxis and Swelling    Pt has been stung by honey bees since he became a vegetarian at age 48 yrs old with no reaction.  . Other Shortness Of Breath and Itching    Reaction to cut grass, pet dander  . Decadron [Dexamethasone] Itching    Reaction to injection  . Shrimp [Shellfish Allergy] Swelling and Other (See Comments)    Muscle cramps  . Vancomycin Itching    Reaction to IV - pt states no reaction if it is infused slowly  . Wheat Bran Swelling    Some swelling  . Latex Hives and Rash    Reaction to fumes from latex paint    Patient Measurements: Height: 5\' 8"  (172.7 cm) Weight: 215 lb (97.5 kg) IBW/kg (Calculated) : 68.4  Vital Signs: Temp: 97.6 F (36.4 C) (11/27 1148) Temp Source: Oral (11/27 1148) BP: 149/82 (11/27 1500) Pulse Rate: 53 (11/27 1500)  Labs: Recent Labs    09/30/19 1217  CREATININE 1.17    Estimated Creatinine Clearance: 85.5 mL/min (by C-G formula based on SCr of 1.17 mg/dL).   Medical History: Past Medical History:  Diagnosis Date  . Aggressiveness 12/28/2018  . Asthma   . Cervical radiculopathy   . Chronic back pain   . Kidney stones   . Reactive airway disease     Medications:  (Not in a hospital admission)   Assessment: 48 yo male presented on 09/30/2019 with right upper chest discomfort. Patient is undergoing chemotherapy for rectal cancer with a chemotherapy port in the area of discomfort. Upper extremity ultrasound on 09/30/2019 found acute DVT involving right internal jugular vein. d-Dimer 0.87. Pharmacy consulted to dose apixaban for IJ DVT. Patient is not on anticoagulation prior to admission.   Goal of Therapy:  Monitor platelets by anticoagulation protocol: Yes   Plan:  Start apixaban 10mg  twice daily for 14 doses followed by apixaban  5mg  twice daily (first dose of 5mg  at 2200 on 12/4) Monitor S/S of bleeding  Pharmacy will provide patient education    Cristela Felt, PharmD PGY1 Pharmacy Resident [employer]: [phone]  09/30/2019,4:23 PM

## 2019-09-30 NOTE — ED Triage Notes (Signed)
Pt arrives POV for eval of pain surrounding his port. Pt has port for chemo/CA treatment. Pt reports that the pain started about 2 weeks ago, reports pain around tunneled catheter in neck as well. Port is not red, warm to touch or irritated. Site mildly TTP.

## 2019-09-30 NOTE — Progress Notes (Signed)
Right upper extremity venous duplex completed. Refer to "CV Proc" under chart review to view preliminary results.  Critical results discussed with Dr. Vanita Panda.  09/30/2019 2:52 PM Kelby Aline., MHA, RVT, RDCS, RDMS

## 2019-09-30 NOTE — ED Notes (Signed)
Patient verbalizes understanding of discharge instructions. Opportunity for questioning and answers were provided. Armband removed by staff, pt discharged from ED ambulatory.   

## 2019-10-04 MED FILL — LORAZEPAM 1 MG TABS: 1 | 1 days supply | Qty: 3 | Fill #0

## 2019-10-06 MED FILL — oxyCODONE HCL 10 MG TABS: 10 | 30 days supply | Qty: 90 | Fill #0

## 2019-10-13 ENCOUNTER — Emergency Department (HOSPITAL_COMMUNITY)
Admission: EM | Admit: 2019-10-13 | Discharge: 2019-10-14 | Discharge status: Against Medical Advice | Payer: Medicaid Other | Attending: Emergency Medicine | Admitting: Emergency Medicine

## 2019-10-13 ENCOUNTER — Encounter (HOSPITAL_COMMUNITY): Payer: Self-pay

## 2019-10-13 DIAGNOSIS — Z5321 Procedure and treatment not carried out due to patient leaving prior to being seen by health care provider: Secondary | ICD-10-CM | POA: Insufficient documentation

## 2019-10-13 DIAGNOSIS — R0789 Other chest pain: Secondary | ICD-10-CM | POA: Diagnosis present

## 2019-10-13 LAB — CBC
HCT: 40 % (ref 39.0–52.0)
Hemoglobin: 13.2 g/dL (ref 13.0–17.0)
MCH: 30.5 pg (ref 26.0–34.0)
MCHC: 33 g/dL (ref 30.0–36.0)
MCV: 92.4 fL (ref 80.0–100.0)
Platelets: 247 10*3/uL (ref 150–400)
RBC: 4.33 MIL/uL (ref 4.22–5.81)
RDW: 13.2 % (ref 11.5–15.5)
WBC: 4.3 10*3/uL (ref 4.0–10.5)
nRBC: 0 % (ref 0.0–0.2)

## 2019-10-13 LAB — BASIC METABOLIC PANEL
Anion gap: 13 (ref 5–15)
BUN: 14 mg/dL (ref 6–20)
CO2: 25 mmol/L (ref 22–32)
Calcium: 9.2 mg/dL (ref 8.9–10.3)
Chloride: 106 mmol/L (ref 98–111)
Creatinine, Ser: 1.25 mg/dL — ABNORMAL HIGH (ref 0.61–1.24)
GFR calc Af Amer: >60 mL/min (ref >60)
GFR calc non Af Amer: >60 mL/min (ref >60)
Glucose, Bld: 107 mg/dL — ABNORMAL HIGH (ref 70–99)
Potassium: 4.3 mmol/L (ref 3.5–5.1)
Sodium: 144 mmol/L (ref 135–145)

## 2019-10-13 LAB — TROPONIN I (HIGH SENSITIVITY): Troponin I (High Sensitivity): 4 ng/L (ref <18)

## 2019-10-13 MED ORDER — SODIUM CHLORIDE 0.9% FLUSH: 3.0000 mL | Freq: Once | INTRAVENOUS | Status: DC

## 2019-10-13 NOTE — ED Triage Notes (Signed)
Pt comes for CP that has been going on for the past 4 days, reports that he has a clot around his port, pain radiates to back, pt now having pain in his R leg up to his groin.

## 2019-10-14 NOTE — ED Notes (Signed)
After the pt was triaged he notified the RN that he would wait in his car to wait because he has cancer. Staff called the pt on the phone  to come back in for repeat vital signs and troponin. The pt notified staff that he had gone home because he had a doctors appointment in the morning at 8am and wanted to get some rest.

## 2019-10-25 MED FILL — NEOMYCIN 500 MG TABLET: 500 | 1 days supply | Qty: 6 | Fill #0

## 2019-10-25 MED FILL — metroNIDAZOLE 500 MG TABS: 500 | 1 days supply | Qty: 3 | Fill #0

## 2019-10-26 ENCOUNTER — Telehealth: Payer: Self-pay | Admitting: *Deleted

## 2019-10-26 NOTE — Telephone Encounter (Addendum)
Asking to discuss his case with Dr. Benay Spice. Saw Dr. Hester Mates on 12/22 and are planning on 11/18/19 at [facility] with possible APR w/colostomy. Is not happy with his visit. Had infusional 5FU for 3 weeks and added oxaliplatin and he "had a reaction". MD said he has high risk of recurrence regardless of surgery or not. Hospice was brought up in the visit as well. PET scan has recently shown a liver mass. Is on Prilosec for gastritis and developed a DVT due to port and is now on Eliquis. Requesting a conversation with Dr. Benay Spice. Called patient back and informed him that Dr. Benay Spice will be notified of his request when back in the office tomorrow.

## 2019-10-27 ENCOUNTER — Inpatient Hospital Stay: Payer: Medicaid Other | Attending: Oncology | Admitting: Oncology

## 2019-10-27 DIAGNOSIS — C20 Malignant neoplasm of rectum: Secondary | ICD-10-CM

## 2019-10-27 NOTE — Progress Notes (Signed)
[place] OFFICE VISIT PROGRESS NOTE  I connected with@ on 10/27/19 at  3:00 PM EST by telephone and verified that I am speaking with the correct person using two identifiers.   I discussed the limitations, risks, security and privacy concerns of performing an evaluation and management service by telemedicine and the availability of in-person appointments. I also discussed with the patient that there may be a patient responsible charge related to this service. The patient expressed understanding and agreed to proceed.    Patient's location: Home Provider's location: Office   Diagnosis: Rectal cancer  INTERVAL HISTORY:   Bradley Colon is seen today for a telehealth visit.  This is secondary to the Covid pandemic and his request. Bradley Colon has been treated for rectal cancer at [facility].  He was last seen here in July.  He completed concurrent Xeloda and radiation followed by 2 cycles of weekly bolus 5-FU/leucovorin and a single Cycle of FLOX with dose reduced oxaliplatin.  He reports the oxaliplatin based chemotherapy is complicated by diffuse pain and protracted nausea/vomiting.  He last received chemotherapy on 08/19/2019. He has undergone reimaging at [facility] over the past month and is scheduled for a low anterior resection and ablation of a liver lesion on 11/18/2019.  An MRI on 10/05/2019 revealed a right liver lesion concerning for metastasis.  The lesion was unchanged in size.  Wall thickening of the anterior low rectum, no new or enlarging lymph nodes.  Stable mesorectal and right internal iliac nodes.  A CT chest on 10/04/2019 revealed a continued slight increase in pulmonary nodules.  A PET scan 10/14/2019 revealed a hypermetabolic right liver lesion, mildly hypermetabolic right internal iliac lymphadenopathy, suspicious pulmonary nodules below PET resolution.  Mild gastric hypermetabolism is felt to be related to gastritis.  He saw Dr. Rigoberto Noel on  10/23/2019 and discussed treatment options.  He understands the high chance of developing recurrent disease following surgery.  Hospice care was discussed since Bradley Colon had difficulty tolerating chemotherapy.  He feels well at present.  Good appetite.  He no longer has rectal pain.  He is having bowel movements.  He reports gaining a significant amount of weight.  He developed "peeling" of the hands and feet following Xeloda therapy. Lab Results:  Lab Results  Component Value Date   WBC 4.3 10/13/2019   HGB 13.2 10/13/2019   HCT 40.0 10/13/2019   MCV 92.4 10/13/2019   PLT 247 10/13/2019   NEUTROABS 3.2 11/14/2016    Medications: I have reviewed the patient's current medications.  Assessment/Plan: 1. Rectal cancer, clinical stage III (T3cN2) ? Rectal mass extending from 4-9 cm on colonoscopy 02/15/2019 with a biopsy confirming invasive adenocarcinoma ? CTs 02/23/2019- thickening of the low rectum without a discrete mass, small bilateral pulmonary nodules, some present on imaging from 2014 ? Pelvic MRI 02/25/2019- T3cN2 tumor beginning at 4.9 cm from the anal verge and 0 cm from the internal anal sphincter, tumor extends through the muscularis propria, 2.  Rectal lymph nodes and 2 enlarged right internal iliac nodes ? [facility] at [facility] 04/19/2019- new subcentimeter right hepatic lesion concerning for metastasis, stable low rectal mass with prominent mesorectal lymph nodes, stable pathologic enlarged right internal iliac nodes, stable pulmonary nodules ? Xeloda/radiation beginning 04/26/2019, completed 07/05/2019 ? CT 07/26/2019-growth of previously described right hepatic lesion, slight decrease in pelvic lymphadenopathy and rectal thickening ? 08/05/2019-week 1 bolus 5-FU/leucovorin (200 mg/m) ? 08/12/2019-week to bolus 5-FU/leucovorin (400 mg/m) ? 1016 2020-week 3  bolus 5-FU/leucovorin (400 mg/m) and oxaliplatin (42.5 mg/m) ? CT chest 10/04/2019-slight increase in bilateral pulmonary nodules  concerning for metastases ? MRI abdomen 10/06/2019-right hepatic lesion consistent with metastasis, wall thickening of the low anterior rectum, no new or enlarging adenopathy, stable mesorectal and right internal iliac nodes ? PET scan 10/14/2019-pulmonary nodules below PET resolution, hypermetabolic right posterior hepatic lesion, mildly hypermetabolic partially calcified right internal iliac chain lymphadenopathy, mild gastric hypermetabolism and thickening-likely gastritis 2. Pain secondary to #1, improved 3. Reactive airways disease/asthma 4. Report of multiple allergens 5. Port-A-Cath related DVT, right IJ, 09/30/2019-apixaban    Disposition: Bradley Colon has rectal cancer.  He completed a course of Xeloda/radiation followed by an abbreviated course of 5-FU/leucovorin and oxaliplatin.  The oxaliplatin therapy was complicated by prolonged toxicity.  He has not received systemic therapy since October. Restaging images reveal evidence of a right liver metastasis, persistent pelvic lymphadenopathy, tumor at the low rectum, and probable lung metastases.  Bradley Colon has discussed the staging studies and treatment options at length with Dr. Rigoberto Noel and Dr. Hester Mates.  We reviewed treatment options again today.  He understands surgery is unlikely to be curative if the liver and/or lung lesions represent metastatic rectal cancer.  He plans to proceed with the low anterior resection and liver ablation on 11/17/2018.  He would like to return here after surgery to discuss systemic treatment options.    Bradley Colon will be scheduled for an office visit 2-3 weeks following surgery.   I discussed the assessment and treatment plan with the patient. The patient was provided an opportunity to ask questions and all were answered. The patient agreed with the plan and demonstrated an understanding of the instructions.   The patient was advised to call back or seek an in-person evaluation if the symptoms worsen or if the  condition fails to improve as anticipated.  I provided 40 minutes of telephone, chart review, and documentation time during this encounter, and > 50% was spent counseling as documented under my assessment & plan.  Betsy Coder ANP/GNP-BC   10/27/2019 3:03 PM

## 2019-10-28 ENCOUNTER — Encounter: Payer: Self-pay | Admitting: Oncology

## 2019-10-31 MED FILL — OxyCONTIN 20 MG T12A: 20 | 30 days supply | Qty: 60 | Fill #0

## 2019-11-01 ENCOUNTER — Telehealth: Payer: Self-pay | Admitting: Oncology

## 2019-11-01 MED FILL — ELIQUIS 5 MG TABLET: 5 | 30 days supply | Qty: 60 | Fill #0

## 2019-11-01 NOTE — Telephone Encounter (Signed)
Scheduled per los. Called and spoke with patient. Confirmed appt 

## 2019-11-02 ENCOUNTER — Encounter: Payer: Self-pay | Admitting: *Deleted

## 2019-11-04 DIAGNOSIS — C801 Malignant (primary) neoplasm, unspecified: Secondary | ICD-10-CM

## 2019-11-04 HISTORY — DX: Malignant (primary) neoplasm, unspecified: C80.1

## 2019-11-09 MED FILL — FUROSEMIDE 20 MG TABS: 20 | 20 days supply | Qty: 20 | Fill #0

## 2019-11-10 MED FILL — ALBUTEROL SULFATE HFA 108 (: 108 (90 BAS | 50 days supply | Qty: 18 | Fill #0

## 2019-11-10 MED FILL — oxyCODONE HCL 10 MG TABS: 10 | 4 days supply | Qty: 20 | Fill #0

## 2019-11-16 MED FILL — ALPRAZolam 0.25 MG TABS: 0.25 | 10 days supply | Qty: 40 | Fill #0

## 2019-11-18 HISTORY — PX: OTHER SURGICAL HISTORY: SHX169

## 2019-11-24 MED FILL — ENOXAPARIN SODIUM 100 MG/ML: 100 | 28 days supply | Qty: 56 | Fill #0

## 2019-12-02 ENCOUNTER — Telehealth: Payer: Self-pay | Admitting: *Deleted

## 2019-12-02 NOTE — Telephone Encounter (Signed)
Patient left message that "I may need to move my appointment out a week or so". Also reports he had significant pain with his resection and is still in a lot of pain. Reports going several days before his insurance would pay for his pain medication and current pain meds are not helping his pain. Says he is being seen by pain management. Sent mychart message that Dr. Benay Spice will not become involved in his postoperative pain and appointment can be moved if he requests.

## 2019-12-06 ENCOUNTER — Ambulatory Visit: Payer: Medicaid Other | Admitting: Oncology

## 2019-12-09 ENCOUNTER — Telehealth: Payer: Self-pay | Admitting: Oncology

## 2019-12-09 NOTE — Telephone Encounter (Signed)
Called pt per 2/2 sch msg - per pt unable to come in at the moment still in a lot of pain from surgery . Pt will call back

## 2019-12-14 IMAGING — CT CT CHEST WITH CONTRAST
2 of 5 series · 14 of 46 positions shown, 16 images · IV contrast (ISOVUE 300)
Comparison: CT abdomen pelvis, 07/03/2013

CLINICAL DATA: Rectal cancer

EXAM:
CT CHEST, ABDOMEN, AND PELVIS WITH CONTRAST
TECHNIQUE: Multidetector CT imaging of the chest, abdomen and pelvis was
performed following the standard protocol during bolus
administration of intravenous contrast.
CONTRAST:  100mL OMNIPAQUE IOHEXOL 300 MG/ML SOLN, additional oral
enteric contrast

[Series 2: cap with · axial · 0.79mm/px · z∈[-606,-66]mm · 11 of 128 slices shown, 13 images]
[im 10/128  soft-tissue]
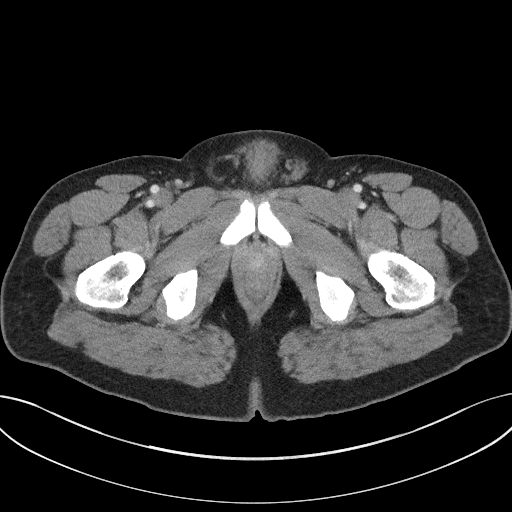
[im 10/128  bone]
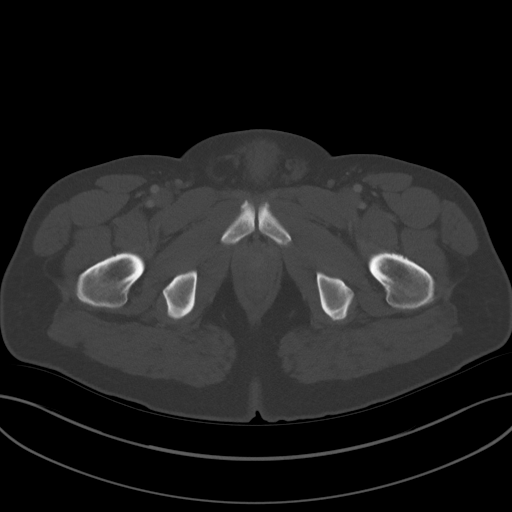
[im 20/128  soft-tissue]
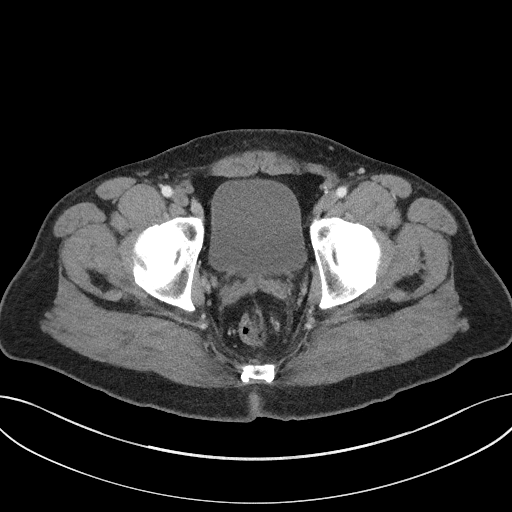
[im 30/128  soft-tissue]
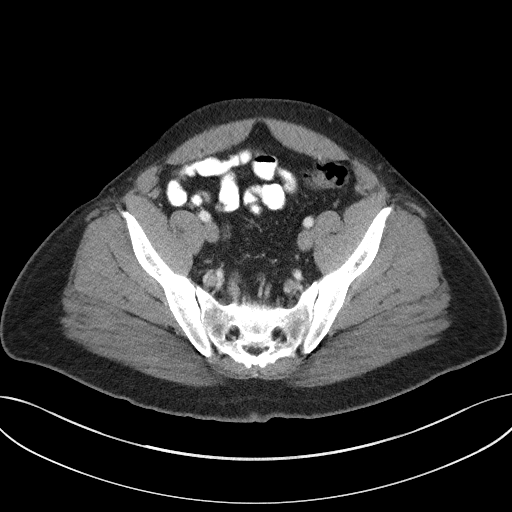
[im 40/128  soft-tissue]
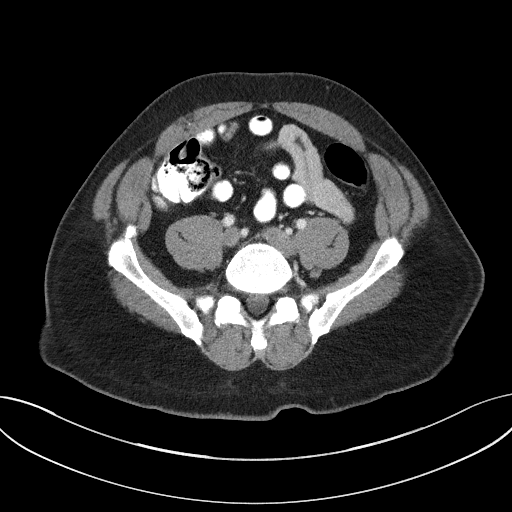
[im 49/128  soft-tissue]
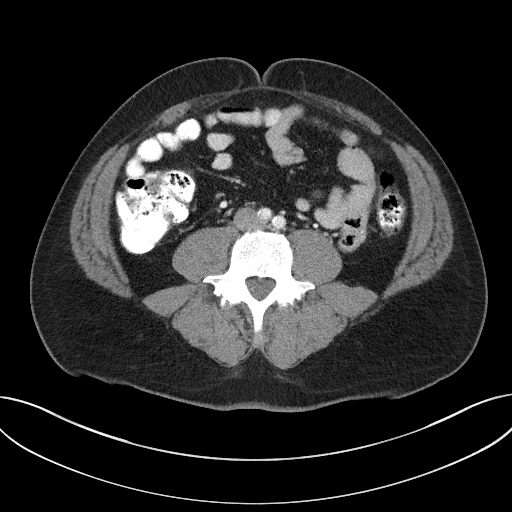
[im 69/128  soft-tissue]
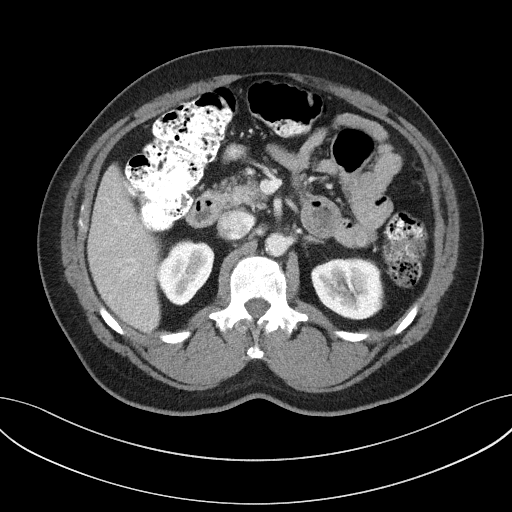
[im 79/128  soft-tissue]
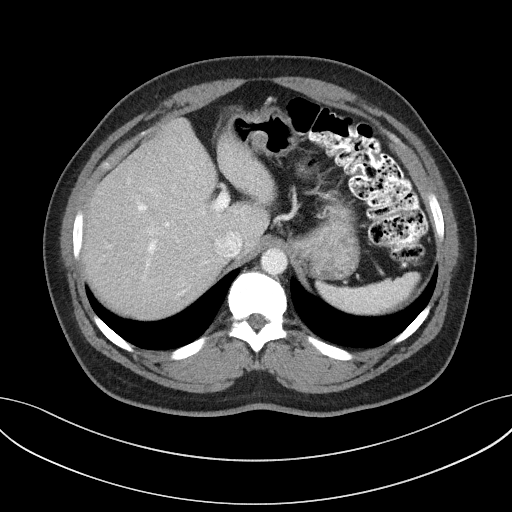
[im 88/128  soft-tissue]
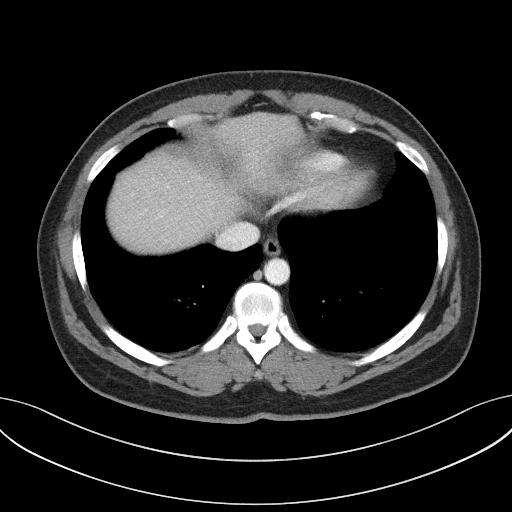
[im 98/128  soft-tissue]
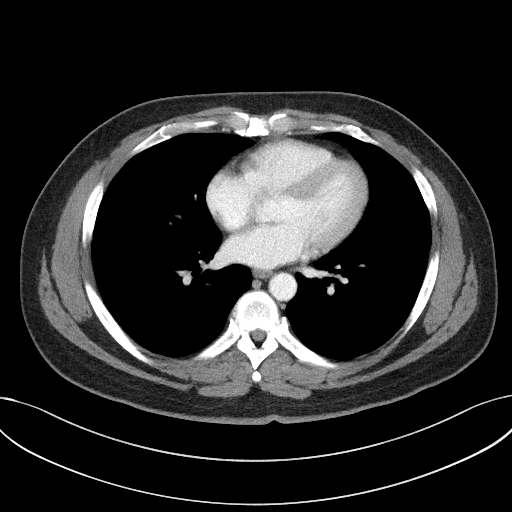
[im 98/128  bone]
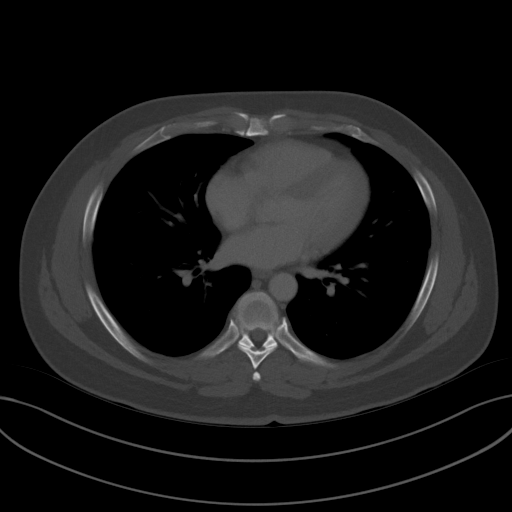
[im 108/128  soft-tissue]
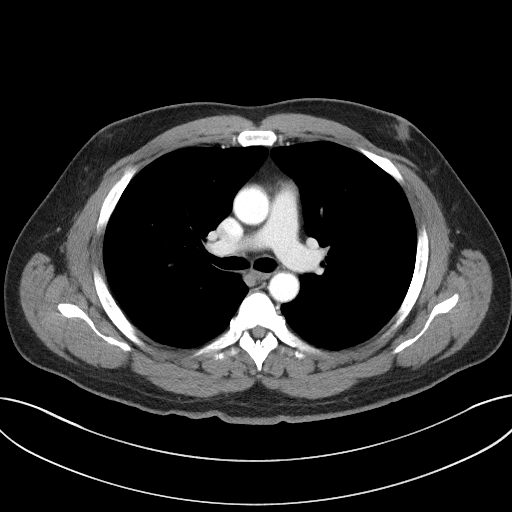
[im 118/128  soft-tissue]
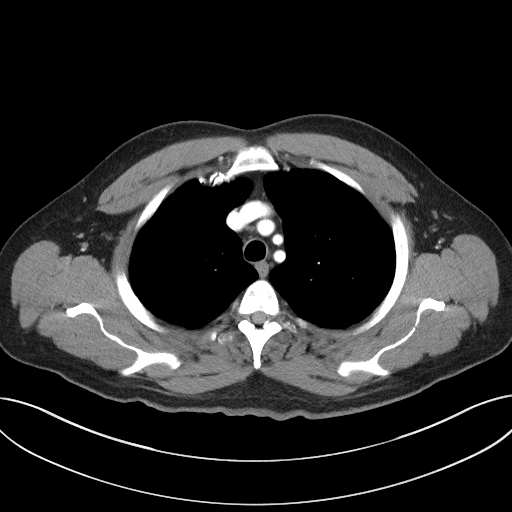

[Series 5: coronal · coronal · 0.70mm/px · 3 of 156 slices shown]
[im 52/156  soft-tissue]
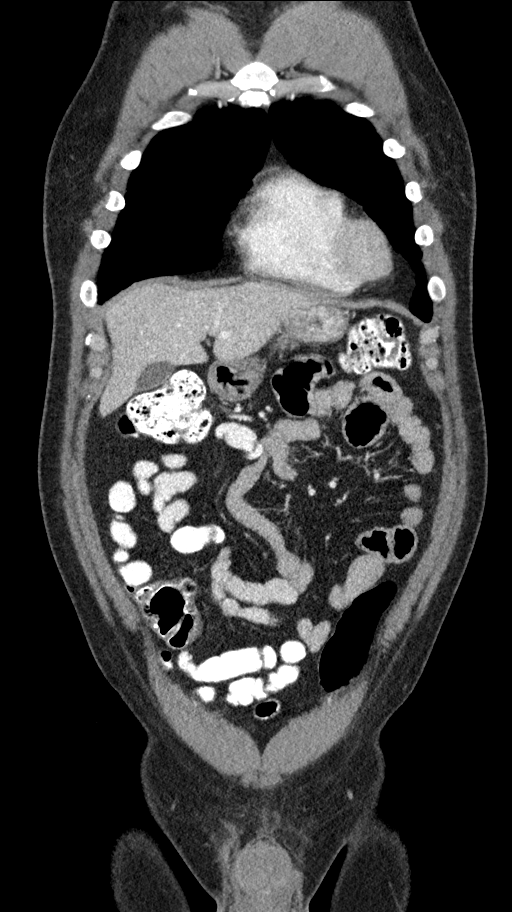
[im 69/156  soft-tissue]
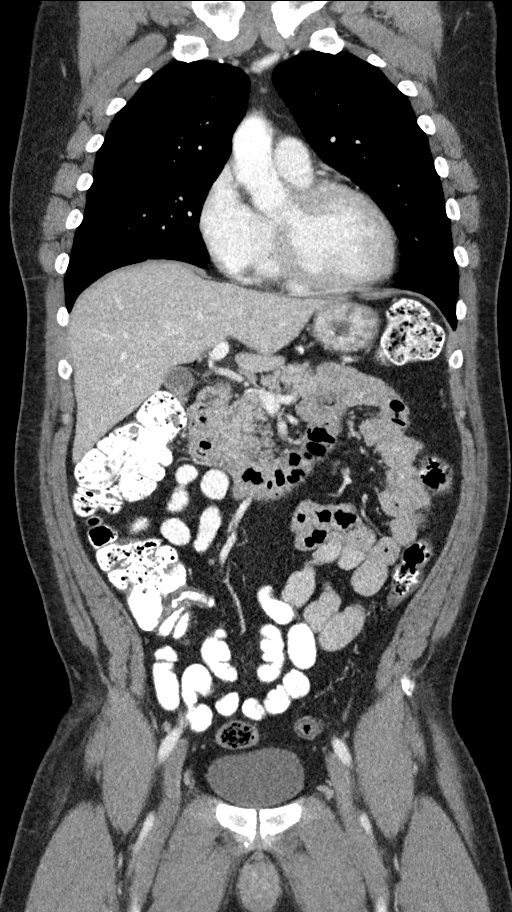
[im 87/156  soft-tissue]
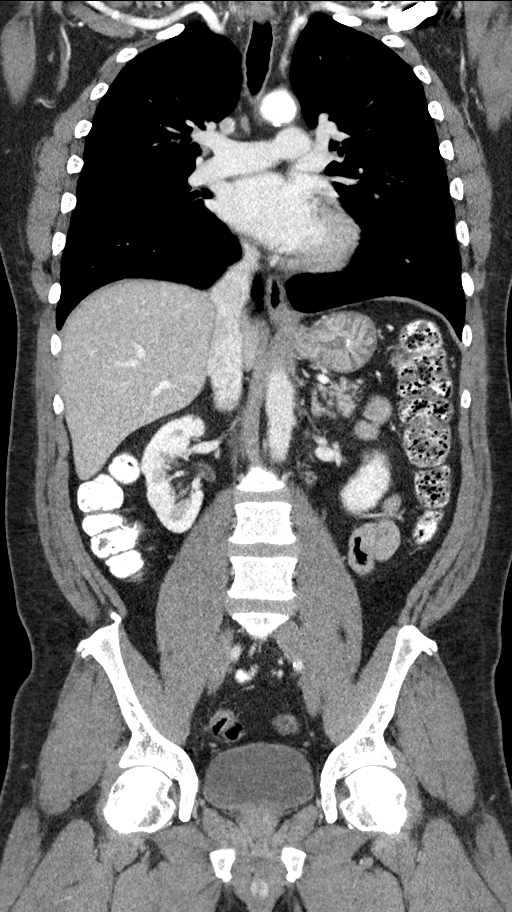

[14 of 46 positions shown; findings below may reference images not displayed]

FINDINGS: CT CHEST FINDINGS

Cardiovascular: No significant vascular findings. Normal heart size.
No pericardial effusion.

Mediastinum/Nodes: No enlarged mediastinal, hilar, or axillary lymph
nodes. Thyroid gland, trachea, and esophagus demonstrate no
significant findings.

Lungs/Pleura: Multiple small bilateral pulmonary nodules, measuring
up to 5 mm in the right middle lobe (series 3, image 77). Additional
nodules include a 3 mm nodule of the left lung base (series 3, image
105), and a 3 mm nodule of the superior segment left lower lobe
(series 3, image 59). No pleural effusion or pneumothorax.

Musculoskeletal: No chest wall mass or suspicious bone lesions
identified.

CT ABDOMEN PELVIS FINDINGS

Hepatobiliary: No focal liver abnormality is seen. No gallstones,
gallbladder wall thickening, or biliary dilatation.

Pancreas: Unremarkable. No pancreatic ductal dilatation or
surrounding inflammatory changes.

Spleen: Normal in size without focal abnormality.

Adrenals/Urinary Tract: Adrenal glands are unremarkable. Kidneys are
normal, without renal calculi, focal lesion, or hydronephrosis.
Bladder is unremarkable.

Stomach/Bowel: Stomach is within normal limits. Appendix appears
normal. Thickening of the low rectum without discrete mass
appreciated.

Vascular/Lymphatic: Calcific atherosclerosis. No enlarged abdominal
or pelvic lymph nodes.

Reproductive: No mass or other abnormality.

Other: No abdominal wall hernia or abnormality. No abdominopelvic
ascites.

Musculoskeletal: No acute or significant osseous findings.
IMPRESSION: 1. Thickening of the low rectum without discrete mass appreciated,
in keeping with known diagnosis of rectal cancer.

2. Multiple small bilateral pulmonary nodules, measuring up to 5 mm
in the right middle lobe (series 3, image 77). Additional nodules
include a 3 mm nodule of the left lung base (series 3, image 105),
and a 3 mm nodule of the superior segment left lower lobe (series 3,
image 59). Nodule of the right middle lobe and left lung base were
seen on examination dated 07/03/2013 and are benign sequelae of
infection or inflammation. Attention on follow-up.

3. No definite evidence of metastatic disease in the chest, abdomen,
or pelvis.

## 2019-12-20 ENCOUNTER — Encounter: Payer: Self-pay | Admitting: Oncology

## 2019-12-26 ENCOUNTER — Telehealth: Payer: Self-pay | Admitting: Oncology

## 2019-12-26 NOTE — Telephone Encounter (Signed)
Scheduled apt per 2/16 sch message -pt is aware of appt date and time

## 2019-12-29 ENCOUNTER — Other Ambulatory Visit: Payer: Self-pay | Admitting: *Deleted

## 2019-12-29 ENCOUNTER — Ambulatory Visit
Admission: RE | Admit: 2019-12-29 | Discharge: 2019-12-29 | Disposition: A | Discharge status: Home / Self Care | Payer: Self-pay | Source: Ambulatory Visit | Attending: Oncology | Admitting: Oncology

## 2019-12-29 ENCOUNTER — Inpatient Hospital Stay: Payer: Medicaid Other | Attending: Oncology | Admitting: Oncology

## 2019-12-29 ENCOUNTER — Other Ambulatory Visit: Payer: Self-pay

## 2019-12-29 VITALS — BP 122/78 | HR 73 | Temp 97.8°F | Resp 18 | Ht 68.0 in | Wt 207.0 lb

## 2019-12-29 DIAGNOSIS — C20 Malignant neoplasm of rectum: Secondary | ICD-10-CM

## 2019-12-29 DIAGNOSIS — C787 Secondary malignant neoplasm of liver and intrahepatic bile duct: Secondary | ICD-10-CM | POA: Diagnosis not present

## 2019-12-29 DIAGNOSIS — G893 Neoplasm related pain (acute) (chronic): Secondary | ICD-10-CM | POA: Diagnosis not present

## 2019-12-29 NOTE — Progress Notes (Signed)
[place] OFFICE PROGRESS NOTE   Diagnosis: Rectal cancer  INTERVAL HISTORY:   Bradley Colon underwent a low anterior resection, diverting ileostomy, and ablation of a segment 7 liver lesion on 11/18/2019.  He continues to recover from surgery.  The ileostomy is functioning well.  The stool is not loose.  He continues to have rectal pain.  The pain is being managed by the [facility] pain service.  He is being treated with gabapentin, OxyContin, and oxycodone.  He reports he is currently weaning the oxycodone dose.  He is scheduled for a telehealth visit with the pain service later today. He saw Dr. Rigoberto Noel following surgery.  A restaging chest CT is recommended.  Adjuvant chemotherapy is recommended if there is no CT evidence of metastatic disease.  Anticoagulation was changed to Lovenox surrounding surgery.  He reports difficulty performing the Lovenox injections. Objective:  Vital signs in last 24 hours:  Blood pressure 122/78, pulse 73, temperature 97.8 F (36.6 C), temperature source Temporal, resp. rate 18, height 5\' 8"  (1.727 m), weight 207 lb (93.9 kg), SpO2 99 %.    Limited physical examination secondary to distancing with the Covid pandemic Lymphatics: No cervical, supraclavicular, axillary, or inguinal nodes GI: No hepatosplenomegaly, no mass, nontender, right lower quadrant ileostomy.  Healed midline incision Vascular: No leg edema   Portacath/PICC-without erythema  Lab Results:  Lab Results  Component Value Date   WBC 4.3 10/13/2019   HGB 13.2 10/13/2019   HCT 40.0 10/13/2019   MCV 92.4 10/13/2019   PLT 247 10/13/2019   NEUTROABS 3.2 11/14/2016    CMP  Lab Results  Component Value Date   NA 144 10/13/2019   K 4.3 10/13/2019   CL 106 10/13/2019   CO2 25 10/13/2019   GLUCOSE 107 (H) 10/13/2019   BUN 14 10/13/2019   CREATININE 1.25 (H) 10/13/2019   CALCIUM 9.2 10/13/2019   PROT 7.4 11/14/2016   ALBUMIN 4.1 11/14/2016   AST 24 11/14/2016   ALT 23  11/14/2016   ALKPHOS 65 11/14/2016   BILITOT 0.6 11/14/2016   GFRNONAA >60 10/13/2019   GFRAA >60 10/13/2019     Medications: I have reviewed the patient's current medications.   Assessment/Plan: 1. Rectal cancer, clinical stage III (T3cN2) ? Rectal mass extending from 4-9 cm on colonoscopy 02/15/2019 with a biopsy confirming invasive adenocarcinoma ? CTs 02/23/2019- thickening of the low rectum without a discrete mass, small bilateral pulmonary nodules, some present on imaging from 2014 ? Pelvic MRI 02/25/2019- T3cN2 tumor beginning at 4.9 cm from the anal verge and 0 cm from the internal anal sphincter, tumor extends through the muscularis propria, 2.  Rectal lymph nodes and 2 enlarged right internal iliac nodes ? CTs at [facility] 04/19/2019- new subcentimeter right hepatic lesion concerning for metastasis, stable low rectal mass with prominent mesorectal lymph nodes, stable pathologic enlarged right internal iliac nodes, stable pulmonary nodules ? Xeloda/radiation beginning 04/26/2019, completed 07/05/2019 ? CT 07/26/2019-growth of previously described right hepatic lesion, slight decrease in pelvic lymphadenopathy and rectal thickening ? 08/05/2019-week 1 bolus 5-FU/leucovorin (200 mg/m) ? 08/12/2019-week to bolus 5-FU/leucovorin (400 mg/m) ? 1016 2020-week 3 bolus 5-FU/leucovorin (400 mg/m) and oxaliplatin (42.5 mg/m) ? CT chest 10/04/2019-slight increase in bilateral pulmonary nodules concerning for metastases ? MRI abdomen 10/06/2019-right hepatic lesion consistent with metastasis, wall thickening of the low anterior rectum, no new or enlarging adenopathy, stable mesorectal and right internal iliac nodes ? PET scan 10/14/2019-pulmonary nodules below PET resolution, hypermetabolic right posterior hepatic lesion, mildly  hypermetabolic partially calcified right internal iliac chain lymphadenopathy, mild gastric hypermetabolism and thickening-likely gastritis ? 11/18/2019-LAR with diverting loop  ileostomy and ablation of solitary liver metastasis (2 cm segment 7), moderately differentiated adenocarcinoma invading into soft tissue, LVI present, perineural invasion present, 5/15 lymph nodes positive, 4 tumor deposits, absent treatment effect-score 3,ypT3 ypN2a 2. Pain secondary to #1, improved 3. Reactive airways disease/asthma 4. Report of multiple allergens 5. Port-A-Cath related DVT, right IJ, 09/30/2019-apixaban, converted to Lovenox surrounding surgery January 2021, changed back to apixaban 12/30/2019      Disposition: Mr. Sasso continues to recover from the low anterior resection.  He appears well today.  He will continue pain management via the [facility] pain service.  I reviewed details of the surgical pathology report with him.  He appears to have metastatic rectal cancer, status post ablation of an isolated liver lesion, and resection of a locally advanced tumor.  He may have lung metastases.  I agree with the recommendation of Dr. Rigoberto Noel to consider adjuvant chemotherapy if the chest CT reveals no evidence of metastatic disease.  Mr. Perreira will undergo a restaging chest CT within the next few days.  We will ask for a direct comparison to the December study from [place].  He will return for an office visit and further discussion on 01/04/2020.  He reports difficulty performing the Lovenox injections.  He will transition back to apixaban anticoagulation beginning tomorrow. Betsy Coder, MD  12/29/2019  1:32 PM

## 2019-12-29 NOTE — Progress Notes (Signed)
Called [facility] with request to import from [place] the PET of 10/14/19 and CT chest of 10/04/2019. Sent message to managed care to work on PA for CT chest Dr. Benay Spice wants by 01/02/20

## 2019-12-30 ENCOUNTER — Encounter: Payer: Self-pay | Admitting: Oncology

## 2020-01-02 ENCOUNTER — Emergency Department (HOSPITAL_COMMUNITY)
Admission: EM | Admit: 2020-01-02 | Discharge: 2020-01-03 | Disposition: A | Discharge status: Home / Self Care | Payer: Medicaid Other | Attending: Emergency Medicine | Admitting: Emergency Medicine

## 2020-01-02 ENCOUNTER — Encounter (HOSPITAL_COMMUNITY): Payer: Self-pay | Admitting: Emergency Medicine

## 2020-01-02 ENCOUNTER — Other Ambulatory Visit: Payer: Self-pay

## 2020-01-02 ENCOUNTER — Emergency Department (HOSPITAL_COMMUNITY): Payer: Medicaid Other

## 2020-01-02 ENCOUNTER — Telehealth: Payer: Self-pay | Admitting: Oncology

## 2020-01-02 DIAGNOSIS — N433 Hydrocele, unspecified: Secondary | ICD-10-CM | POA: Insufficient documentation

## 2020-01-02 DIAGNOSIS — N451 Epididymitis: Secondary | ICD-10-CM | POA: Diagnosis not present

## 2020-01-02 DIAGNOSIS — N50819 Testicular pain, unspecified: Secondary | ICD-10-CM | POA: Insufficient documentation

## 2020-01-02 DIAGNOSIS — Z5321 Procedure and treatment not carried out due to patient leaving prior to being seen by health care provider: Secondary | ICD-10-CM | POA: Diagnosis not present

## 2020-01-02 DIAGNOSIS — N50812 Left testicular pain: Secondary | ICD-10-CM | POA: Diagnosis present

## 2020-01-02 DIAGNOSIS — N5089 Other specified disorders of the male genital organs: Secondary | ICD-10-CM

## 2020-01-02 HISTORY — DX: Malignant (primary) neoplasm, unspecified: C80.1

## 2020-01-02 LAB — URINALYSIS, ROUTINE W REFLEX MICROSCOPIC
Bacteria, UA: NONE SEEN
Bilirubin Urine: NEGATIVE
Glucose, UA: NEGATIVE mg/dL
Ketones, ur: NEGATIVE mg/dL
Leukocytes,Ua: NEGATIVE
Nitrite: NEGATIVE
Protein, ur: NEGATIVE mg/dL
Specific Gravity, Urine: 1.02 (ref 1.005–1.030)
pH: 5 (ref 5.0–8.0)

## 2020-01-02 LAB — CBC WITH DIFFERENTIAL/PLATELET
Abs Immature Granulocytes: 0.02 10*3/uL (ref 0.00–0.07)
Basophils Absolute: 0 10*3/uL (ref 0.0–0.1)
Basophils Relative: 0 %
Eosinophils Absolute: 0.3 10*3/uL (ref 0.0–0.5)
Eosinophils Relative: 3 %
HCT: 40.7 % (ref 39.0–52.0)
Hemoglobin: 12.6 g/dL — ABNORMAL LOW (ref 13.0–17.0)
Immature Granulocytes: 0 %
Lymphocytes Relative: 16 %
Lymphs Abs: 1.5 10*3/uL (ref 0.7–4.0)
MCH: 27.5 pg (ref 26.0–34.0)
MCHC: 31 g/dL (ref 30.0–36.0)
MCV: 88.9 fL (ref 80.0–100.0)
Monocytes Absolute: 0.9 10*3/uL (ref 0.1–1.0)
Monocytes Relative: 9 %
Neutro Abs: 6.6 10*3/uL (ref 1.7–7.7)
Neutrophils Relative %: 72 %
Platelets: 447 10*3/uL — ABNORMAL HIGH (ref 150–400)
RBC: 4.58 MIL/uL (ref 4.22–5.81)
RDW: 14.4 % (ref 11.5–15.5)
WBC: 9.3 10*3/uL (ref 4.0–10.5)
nRBC: 0 % (ref 0.0–0.2)

## 2020-01-02 LAB — COMPREHENSIVE METABOLIC PANEL
ALT: 24 U/L (ref 0–44)
AST: 18 U/L (ref 15–41)
Albumin: 3.9 g/dL (ref 3.5–5.0)
Alkaline Phosphatase: 72 U/L (ref 38–126)
Anion gap: 9 (ref 5–15)
BUN: 12 mg/dL (ref 6–20)
CO2: 25 mmol/L (ref 22–32)
Calcium: 9.8 mg/dL (ref 8.9–10.3)
Chloride: 107 mmol/L (ref 98–111)
Creatinine, Ser: 1.27 mg/dL — ABNORMAL HIGH (ref 0.61–1.24)
GFR calc Af Amer: >60 mL/min (ref >60)
GFR calc non Af Amer: >60 mL/min (ref >60)
Glucose, Bld: 109 mg/dL — ABNORMAL HIGH (ref 70–99)
Potassium: 4.1 mmol/L (ref 3.5–5.1)
Sodium: 141 mmol/L (ref 135–145)
Total Bilirubin: 1 mg/dL (ref 0.3–1.2)
Total Protein: 7.9 g/dL (ref 6.5–8.1)

## 2020-01-02 NOTE — ED Triage Notes (Signed)
Patient reports left testicular pain with swelling for 2 days , denies penile discharge or dysuria . No fever or chills .

## 2020-01-02 NOTE — ED Notes (Signed)
Pt states he is going to wait in his car because he is a cancer pt.

## 2020-01-02 NOTE — Telephone Encounter (Signed)
Rescheduled appt per per 3/1 sch message - pt is aware of apt date and time

## 2020-01-03 MED ORDER — AZITHROMYCIN 250 MG PO TABS: 1000.0000 mg | ORAL_TABLET | Freq: Once | ORAL | Status: DC

## 2020-01-03 MED ORDER — CEFTRIAXONE SODIUM 500 MG IJ SOLR: 500.0000 mg | Freq: Once | INTRAMUSCULAR | Status: DC

## 2020-01-03 NOTE — ED Notes (Signed)
Spoke with patient, he states he is no longer sitting in vehicle waiting, he has returned home. Advised patient he can return if he feels he needs to be seen or follow up with his PCP tomorrow. Pt verbalized understanding.

## 2020-01-04 ENCOUNTER — Telehealth: Payer: Self-pay | Admitting: *Deleted

## 2020-01-04 ENCOUNTER — Ambulatory Visit: Payer: Medicaid Other | Admitting: Nurse Practitioner

## 2020-01-04 NOTE — Telephone Encounter (Signed)
Left message that he went to ER and had [place] of testicles due to continued pain. Uncomfortable in ER waiting for results, so he sat in his car and the left after being told he will have an extended wait. Left testicle feels a little better with rest and alternating cold/warm packs. Asking for Dr. Benay Spice to review report and recommend treatment. MD reviewed [place] and shows could have orchitis. Confirmed he still goes to Primary Care at [facility]. Faxed the report to his PCP and instructed him to call to be seen there today. He agrees to this plan.

## 2020-01-06 ENCOUNTER — Telehealth: Payer: Self-pay | Admitting: *Deleted

## 2020-01-06 ENCOUNTER — Ambulatory Visit (HOSPITAL_COMMUNITY): Payer: Medicaid Other

## 2020-01-06 NOTE — Telephone Encounter (Signed)
Patient was a no show for his CT scan today. He reports his dog in in labor and having difficulty, so he is taking her the vet. Requests reschedule for any day. Per central scheduling the 1st available for CT is 01/16/20 at 3:15/3:30 pm. Scan scheduled and OV rescheduled for 01/17/20. Notified patient via Mychart w/request to return call and confirm.

## 2020-01-09 ENCOUNTER — Ambulatory Visit: Payer: Medicaid Other | Admitting: Oncology

## 2020-01-16 ENCOUNTER — Other Ambulatory Visit: Payer: Self-pay

## 2020-01-16 ENCOUNTER — Ambulatory Visit (HOSPITAL_COMMUNITY)
Admission: RE | Admit: 2020-01-16 | Discharge: 2020-01-16 | Disposition: A | Discharge status: Home / Self Care | Payer: Medicaid Other | Source: Ambulatory Visit | Attending: Oncology | Admitting: Oncology

## 2020-01-16 DIAGNOSIS — C20 Malignant neoplasm of rectum: Secondary | ICD-10-CM | POA: Diagnosis not present

## 2020-01-17 ENCOUNTER — Inpatient Hospital Stay: Payer: Medicaid Other | Attending: Oncology | Admitting: Oncology

## 2020-01-17 ENCOUNTER — Other Ambulatory Visit: Payer: Self-pay

## 2020-01-17 VITALS — BP 131/86 | HR 90 | Temp 98.3°F | Resp 18 | Ht 69.0 in | Wt 208.1 lb

## 2020-01-17 DIAGNOSIS — C787 Secondary malignant neoplasm of liver and intrahepatic bile duct: Secondary | ICD-10-CM | POA: Diagnosis not present

## 2020-01-17 DIAGNOSIS — R918 Other nonspecific abnormal finding of lung field: Secondary | ICD-10-CM | POA: Diagnosis not present

## 2020-01-17 DIAGNOSIS — C20 Malignant neoplasm of rectum: Secondary | ICD-10-CM | POA: Diagnosis not present

## 2020-01-17 DIAGNOSIS — Z452 Encounter for adjustment and management of vascular access device: Secondary | ICD-10-CM | POA: Diagnosis not present

## 2020-01-17 MED ORDER — HEPARIN SOD (PORK) LOCK FLUSH 100 UNIT/ML IV SOLN
500.0000 [IU] | Freq: Once | INTRAVENOUS | Status: AC
  Administered 2020-01-17: 11:00:00 500 [IU] via INTRAVENOUS
  Filled 2020-01-17: qty 5

## 2020-01-17 MED ORDER — SODIUM CHLORIDE 0.9% FLUSH
10.0000 mL | INTRAVENOUS | Status: DC | PRN
  Administered 2020-01-17: 11:00:00 10 mL via INTRAVENOUS
  Filled 2020-01-17: qty 10

## 2020-01-17 NOTE — Progress Notes (Signed)
[name] OFFICE PROGRESS NOTE   Diagnosis: Rectal cancer  INTERVAL HISTORY:   Bradley Colon returns as scheduled.  He reports the rectal pain has resolved.  He is taking only 1 oxycodone and 1 gabapentin tablet per day.  He developed scrotal swelling on 01/02/2020.  He presented to emergency room, but was not seen.  A testicular ultrasound revealed evidence of left epididymitis and a moderate left hydrocele.  The swelling has resolved.  He completed a course of antibiotics.  He was referred to urology but has not been seen.  Objective:  Vital signs in last 24 hours:  Blood pressure 131/86, pulse 90, temperature 98.3 F (36.8 C), temperature source Temporal, resp. rate 18, height 5\' 9"  (1.753 m), weight 208 lb 1.6 oz (94.4 kg), SpO2 100 %.     Resp: Mild wheezing at the left upper posterior chest that cleared after several respirations, no respiratory distress Cardio: Regular rate and rhythm GI: Right abdomen ileostomy Vascular: No leg edema GU: Examination of the scrotum and testes is unremarkable, no apparent edema  Portacath/PICC-without erythema  Lab Results:  Lab Results  Component Value Date   WBC 9.3 01/02/2020   HGB 12.6 (L) 01/02/2020   HCT 40.7 01/02/2020   MCV 88.9 01/02/2020   PLT 447 (H) 01/02/2020   NEUTROABS 6.6 01/02/2020    CMP  Lab Results  Component Value Date   NA 141 01/02/2020   K 4.1 01/02/2020   CL 107 01/02/2020   CO2 25 01/02/2020   GLUCOSE 109 (H) 01/02/2020   BUN 12 01/02/2020   CREATININE 1.27 (H) 01/02/2020   CALCIUM 9.8 01/02/2020   PROT 7.9 01/02/2020   ALBUMIN 3.9 01/02/2020   AST 18 01/02/2020   ALT 24 01/02/2020   ALKPHOS 72 01/02/2020   BILITOT 1.0 01/02/2020   GFRNONAA >60 01/02/2020   GFRAA >60 01/02/2020    No results found for: CEA1  No results found for: INR  Imaging:  CT CHEST WO CONTRAST  Result Date: 01/16/2020 CLINICAL DATA:  Follow-up lung nodules. EXAM: CT CHEST WITHOUT CONTRAST TECHNIQUE:  Multidetector CT imaging of the chest was performed following the standard protocol without IV contrast. COMPARISON:  10/04/2019 FINDINGS: Cardiovascular: The heart size appears within normal limits. Aortic atherosclerosis. No pericardial effusion identified. Mediastinum/Nodes: No enlarged mediastinal or axillary lymph nodes. Thyroid gland, trachea, and esophagus demonstrate no significant findings. Lungs/Pleura: No pleural effusion, airspace consolidation or atelectasis identified. -Left lower lobe lung nodule measures 0.5 cm and is unchanged compared with 10/04/2019. -Index left upper lobe lung nodule measures 5 mm, image 40/5. Previously 4 mm. -Posterolateral right lower lobe lung nodule measures 1 cm, image 110/5. Previously 6 mm. Upper Abdomen: No acute abnormality. Musculoskeletal: No chest wall mass or suspicious bone lesions identified. IMPRESSION: 1. There has been increase in size of dominant right lower lobe lung nodule which now measures 1 cm. 2. No significant change in the appearance of left lower lobe and left upper lobe lung nodule. Aortic Atherosclerosis (ICD10-I70.0). Electronically Signed   By: Bradley Colon M.D.   On: 01/16/2020 16:58    Medications: I have reviewed the patient's current medications.   Assessment/Plan: 1. Rectal cancer, clinical stage III (T3cN2) ? Rectal mass extending from 4-9 cm on colonoscopy 02/15/2019 with a biopsy confirming invasive adenocarcinoma ? CTs 02/23/2019- thickening of the low rectum without a discrete mass, small bilateral pulmonary nodules, some present on imaging from 2014 ? Pelvic MRI 02/25/2019- T3cN2 tumor beginning at 4.9 cm from the  anal verge and 0 cm from the internal anal sphincter, tumor extends through the muscularis propria, 2.  Rectal lymph nodes and 2 enlarged right internal iliac nodes ? CT[facility] at [facility] 04/19/2019- new subcentimeter right hepatic lesion concerning for metastasis, stable low rectal mass with prominent mesorectal lymph nodes,  stable pathologic enlarged right internal iliac nodes, stable pulmonary nodules ? Xeloda/radiation beginning 04/26/2019, completed 07/05/2019 ? CT 07/26/2019-growth of previously described right hepatic lesion, slight decrease in pelvic lymphadenopathy and rectal thickening ? 08/05/2019-week 1 bolus 5-FU/leucovorin (200 mg/m) ? 08/12/2019-week to bolus 5-FU/leucovorin (400 mg/m) ? 10/16/ 2020-week 3 bolus 5-FU/leucovorin (400 mg/m) and oxaliplatin (42.5 mg/m) ? CT chest 10/04/2019-slight increase in bilateral pulmonary nodules concerning for metastases ? MRI abdomen 10/06/2019-right hepatic lesion consistent with metastasis, wall thickening of the low anterior rectum, no new or enlarging adenopathy, stable mesorectal and right internal iliac nodes ? PET scan 10/14/2019-pulmonary nodules below PET resolution, hypermetabolic right posterior hepatic lesion, mildly hypermetabolic partially calcified right internal iliac chain lymphadenopathy, mild gastric hypermetabolism and thickening-likely gastritis ? 11/18/2019-LAR with diverting loop ileostomy and ablation of solitary liver metastasis (2 cm segment 7), moderately differentiated adenocarcinoma invading into soft tissue, LVI present, perineural invasion present, 5/15 lymph nodes positive, 4 tumor deposits, absent treatment effect-score 3,ypT3 ypN2a  ? CT chest 01/16/2020-increase in the size of a dominant right lower lobe nodule, unchanged left upper lobe and left lower lobe nodules 2. Pain secondary to #1, improved 3. Reactive airways disease/asthma 4. Report of multiple allergens 5. Port-A-Cath related DVT, right IJ, 09/30/2019-apixaban, converted to Lovenox surrounding surgery January 2021, changed back to apixaban 12/30/2019      Disposition: Bradley Colon appears well.  I reviewed the chest CT images with him.  He appears to have metastatic rectal cancer based on the chest CT findings and the previous history of a hypermetabolic right liver lesion.  I  think it is very likely the lung nodules are related to metastatic rectal cancer based on his history.  I do not recommend FOLFOX at present.  I will present his case at the GI tumor conference and I will contact Dr. Rigoberto Noel.  He understands it is unlikely any therapy will be curative if he has metastatic rectal cancer.  The plan is to follow him with observation and repeat a chest CT at a 21-month interval.  This will be prior to the planned ileostomy takedown at [facility].  We will consider initiating systemic therapy if there is significant progression on the next chest CT.  The etiology of the scrotal symptoms is unclear.  He is scheduled for follow-up with urology.  The scrotal edema was most likely related to surgery and radiation.  Betsy Coder, MD  01/17/2020  10:13 AM

## 2020-01-19 ENCOUNTER — Telehealth: Payer: Self-pay | Admitting: Oncology

## 2020-01-19 NOTE — Telephone Encounter (Signed)
Scheduled per los. Called and left msg. mailed printout  

## 2020-01-25 ENCOUNTER — Other Ambulatory Visit: Payer: Self-pay

## 2020-01-30 MED FILL — ELIQUIS 5 MG TABLET: 5 | 30 days supply | Qty: 60 | Fill #1

## 2020-03-01 ENCOUNTER — Inpatient Hospital Stay: Payer: Medicaid Other | Attending: Oncology

## 2020-03-01 ENCOUNTER — Other Ambulatory Visit: Payer: Self-pay | Admitting: *Deleted

## 2020-03-01 ENCOUNTER — Other Ambulatory Visit: Payer: Self-pay

## 2020-03-01 DIAGNOSIS — C20 Malignant neoplasm of rectum: Secondary | ICD-10-CM | POA: Insufficient documentation

## 2020-03-01 DIAGNOSIS — Z452 Encounter for adjustment and management of vascular access device: Secondary | ICD-10-CM | POA: Diagnosis not present

## 2020-03-01 DIAGNOSIS — Z95828 Presence of other vascular implants and grafts: Secondary | ICD-10-CM

## 2020-03-01 MED ORDER — SODIUM CHLORIDE 0.9% FLUSH
10.0000 mL | INTRAVENOUS | Status: DC | PRN
  Administered 2020-03-01: 10 mL via INTRAVENOUS
  Filled 2020-03-01: qty 10

## 2020-03-01 MED ORDER — HEPARIN SOD (PORK) LOCK FLUSH 100 UNIT/ML IV SOLN
500.0000 [IU] | Freq: Once | INTRAVENOUS | Status: AC
  Administered 2020-03-01: 500 [IU] via INTRAVENOUS
  Filled 2020-03-01: qty 5

## 2020-03-01 MED ORDER — APIXABAN 5 MG PO TABS: 5.0000 mg | ORAL_TABLET | Freq: Two times a day (BID) | ORAL | 2 refills | Status: DC

## 2020-03-01 MED FILL — ELIQUIS 5 MG TABLET: 5 | 30 days supply | Qty: 60 | Fill #0

## 2020-03-19 ENCOUNTER — Encounter: Payer: Self-pay | Admitting: Oncology

## 2020-03-20 ENCOUNTER — Telehealth: Payer: Self-pay | Admitting: *Deleted

## 2020-03-20 DIAGNOSIS — C20 Malignant neoplasm of rectum: Secondary | ICD-10-CM

## 2020-03-20 NOTE — Telephone Encounter (Signed)
Called patient and confirmed he does not have a catheter--it was removed in January. Still has burning with voiding. His main concern is his erectile dysfunction. Dr. Benay Spice agrees to send referral to [facility].  Records and demographics faxed.

## 2020-04-03 MED FILL — ELIQUIS 5 MG TABLET: 5 | 30 days supply | Qty: 60 | Fill #1

## 2020-04-09 ENCOUNTER — Other Ambulatory Visit: Payer: Self-pay | Admitting: *Deleted

## 2020-04-11 ENCOUNTER — Encounter (HOSPITAL_COMMUNITY): Payer: Self-pay

## 2020-04-11 ENCOUNTER — Ambulatory Visit (HOSPITAL_COMMUNITY)
Admission: RE | Admit: 2020-04-11 | Discharge: 2020-04-11 | Disposition: A | Discharge status: Home / Self Care | Payer: Medicaid Other | Source: Ambulatory Visit | Attending: Oncology | Admitting: Oncology

## 2020-04-11 ENCOUNTER — Other Ambulatory Visit: Payer: Self-pay

## 2020-04-11 DIAGNOSIS — N433 Hydrocele, unspecified: Secondary | ICD-10-CM | POA: Insufficient documentation

## 2020-04-11 DIAGNOSIS — C20 Malignant neoplasm of rectum: Secondary | ICD-10-CM | POA: Insufficient documentation

## 2020-04-12 ENCOUNTER — Inpatient Hospital Stay: Payer: Medicaid Other

## 2020-04-12 ENCOUNTER — Inpatient Hospital Stay: Payer: Medicaid Other | Attending: Oncology | Admitting: Oncology

## 2020-04-12 ENCOUNTER — Other Ambulatory Visit: Payer: Self-pay

## 2020-04-12 VITALS — BP 131/80 | HR 72 | Temp 97.8°F | Resp 18 | Ht 69.0 in | Wt 217.8 lb

## 2020-04-12 DIAGNOSIS — C20 Malignant neoplasm of rectum: Secondary | ICD-10-CM | POA: Insufficient documentation

## 2020-04-12 DIAGNOSIS — J45909 Unspecified asthma, uncomplicated: Secondary | ICD-10-CM | POA: Insufficient documentation

## 2020-04-12 DIAGNOSIS — C787 Secondary malignant neoplasm of liver and intrahepatic bile duct: Secondary | ICD-10-CM | POA: Diagnosis not present

## 2020-04-12 DIAGNOSIS — Z95828 Presence of other vascular implants and grafts: Secondary | ICD-10-CM

## 2020-04-12 DIAGNOSIS — Z452 Encounter for adjustment and management of vascular access device: Secondary | ICD-10-CM | POA: Insufficient documentation

## 2020-04-12 MED ORDER — SODIUM CHLORIDE 0.9% FLUSH
10.0000 mL | Freq: Once | INTRAVENOUS | Status: AC
  Administered 2020-04-12: 10 mL
  Filled 2020-04-12: qty 10

## 2020-04-12 MED ORDER — HEPARIN SOD (PORK) LOCK FLUSH 100 UNIT/ML IV SOLN
500.0000 [IU] | Freq: Once | INTRAVENOUS | Status: AC
  Administered 2020-04-12: 500 [IU]
  Filled 2020-04-12: qty 5

## 2020-04-12 NOTE — Progress Notes (Signed)
[facility] OFFICE PROGRESS NOTE   Diagnosis: Rectal cancer  INTERVAL HISTORY:   Mr. Pierre returns as scheduled.  He reports resolution of rectal pain and he is no longer taking pain medication.  He has erectile dysfunction.  He saw Dr. Wendy Poet.  He was prescribed Cialis, but this did not help.  He has scheduled a urology evaluation at [facility] to explore other options.  He is scheduled to see Dr. Hester Mates next week to plan the ileostomy reversal.  He reports a good appetite and energy level.  Objective:  Vital signs in last 24 hours:  Blood pressure 131/80, pulse 72, temperature 97.8 F (36.6 C), temperature source Temporal, resp. rate 18, height 5\' 9"  (1.753 m), weight 217 lb 12.8 oz (98.8 kg), SpO2 99 %.   Lymphatics: No cervical, supraclavicular, axillary, or inguinal nodes Resp: Lungs clear bilaterally Cardio: Regular rate and rhythm GI: No hepatosplenomegaly, no mass, right abdomen ileostomy Vascular: No leg edema    Portacath/PICC-without erythema  Lab Results:  Lab Results  Component Value Date   WBC 9.3 01/02/2020   HGB 12.6 (L) 01/02/2020   HCT 40.7 01/02/2020   MCV 88.9 01/02/2020   PLT 447 (H) 01/02/2020   NEUTROABS 6.6 01/02/2020    CMP  Lab Results  Component Value Date   NA 141 01/02/2020   K 4.1 01/02/2020   CL 107 01/02/2020   CO2 25 01/02/2020   GLUCOSE 109 (H) 01/02/2020   BUN 12 01/02/2020   CREATININE 1.27 (H) 01/02/2020   CALCIUM 9.8 01/02/2020   PROT 7.9 01/02/2020   ALBUMIN 3.9 01/02/2020   AST 18 01/02/2020   ALT 24 01/02/2020   ALKPHOS 72 01/02/2020   BILITOT 1.0 01/02/2020   GFRNONAA >60 01/02/2020   GFRAA >60 01/02/2020     Imaging:  CT CHEST WO CONTRAST  Result Date: 04/11/2020 CLINICAL DATA:  Rectal cancer, follow-up pulmonary nodules EXAM: CT CHEST WITHOUT CONTRAST TECHNIQUE: Multidetector CT imaging of the chest was performed following the standard protocol without IV contrast. COMPARISON:  01/16/2020,  10/04/2019 FINDINGS: Cardiovascular: Right chest port catheter. Scattered aortic atherosclerosis normal heart size. No pericardial effusion. Mediastinum/Nodes: No enlarged mediastinal, hilar, or axillary lymph nodes. Thyroid gland, trachea, and esophagus demonstrate no significant findings. Lungs/Pleura: Continued enlargement of multiple small pulmonary nodules, the largest in the right lower lobe with central clearing, measuring 1.2 x 1.0 cm, previously 1.0 x 0.8 cm (series 7, image 112). There are multiple new small pulmonary nodules, for example a 3 mm nodule in the lateral segment right middle lobe (series 7, image 98). No pleural effusion or pneumothorax. Upper Abdomen: No acute abnormality. Musculoskeletal: No chest wall mass or suspicious bone lesions identified. IMPRESSION: 1. Continued enlargement of multiple small pulmonary nodules, as well as multiple new small pulmonary nodules, findings consistent with worsened pulmonary metastatic disease. 2.  Aortic Atherosclerosis (ICD10-I70.0). Electronically Signed   By: Eddie Candle M.D.   On: 04/11/2020 14:10    Medications: I have reviewed the patient's current medications.   Assessment/Plan: 1. Rectal cancer, clinical stage III (T3cN2) ? Rectal mass extending from 4-9 cm on colonoscopy 02/15/2019 with a biopsy confirming invasive adenocarcinoma ? CTs 02/23/2019- thickening of the low rectum without a discrete mass, small bilateral pulmonary nodules, some present on imaging from 2014 ? Pelvic MRI 02/25/2019- T3cN2 tumor beginning at 4.9 cm from the anal verge and 0 cm from the internal anal sphincter, tumor extends through the muscularis propria, 2.  Rectal lymph nodes and 2  enlarged right internal iliac nodes ? CTs at [facility] 04/19/2019- new subcentimeter right hepatic lesion concerning for metastasis, stable low rectal mass with prominent mesorectal lymph nodes, stable pathologic enlarged right internal iliac nodes, stable pulmonary  nodules ? Xeloda/radiation beginning 04/26/2019, completed 07/05/2019 ? CT 07/26/2019-growth of previously described right hepatic lesion, slight decrease in pelvic lymphadenopathy and rectal thickening ? 08/05/2019-week 1 bolus 5-FU/leucovorin (200 mg/m) ? 08/12/2019-week to bolus 5-FU/leucovorin (400 mg/m) ? 10/16/ 2020-week 3 bolus 5-FU/leucovorin (400 mg/m) and oxaliplatin (42.5 mg/m) ? CT chest 10/04/2019-slight increase in bilateral pulmonary nodules concerning for metastases ? MRI abdomen 10/06/2019-right hepatic lesion consistent with metastasis, wall thickening of the low anterior rectum, no new or enlarging adenopathy, stable mesorectal and right internal iliac nodes ? PET scan 10/14/2019-pulmonary nodules below PET resolution, hypermetabolic right posterior hepatic lesion, mildly hypermetabolic partially calcified right internal iliac chain lymphadenopathy, mild gastric hypermetabolism and thickening-likely gastritis ? 11/18/2019-LAR with diverting loop ileostomy and ablation of solitary liver metastasis (2 cm segment 7), moderately differentiated adenocarcinoma invading into soft tissue, LVI present, perineural invasion present, 5/15 lymph nodes positive, 4 tumor deposits, absent treatment effect-score 3,ypT3 ypN2a  ? CT chest 01/16/2020-increase in the size of a dominant right lower lobe nodule, unchanged left upper lobe and left lower lobe nodules ? CT chest 04/11/2020-enlargement of bilateral lung nodules, and new nodules 2. Pain secondary to #1, improved 3. Reactive airways disease/asthma 4. Report of multiple allergens 5. Port-A-Cath related DVT, right IJ, 09/30/2019-apixaban, converted to Lovenox surrounding surgery January 2021, changed back to apixaban 12/30/2019    Disposition: Mr. Sherrer has rectal cancer.  I reviewed the restaging chest CT images with him.  There are enlarging lung nodules in addition to new nodules.  He understands the CT findings are very likely to indicate  metastatic rectal cancer.  No therapy will be curative.  He would like to have the ileostomy reversed.  He is scheduled to see Dr. Hester Mates next week for surgical planning.  He is also scheduled for urology appointment to discuss options for treating the erectile dysfunction.  He is asymptomatic from the rectal cancer at present.  He understands treatment will include systemic chemotherapy.  We decided to hold systemic therapy until after he completes the ileostomy reversal.  He will be scheduled for a restaging PET scan in 3 months.  Mr. Woolum will return for an office visit and Port-A-Cath flush in 6 weeks.  We will request Foundation 1 testing on the resected rectal tumor from [facility]. Betsy Coder, MD  04/12/2020  11:10 AM

## 2020-04-13 ENCOUNTER — Telehealth: Payer: Self-pay | Admitting: *Deleted

## 2020-04-13 MED ORDER — PREDNISONE 5 MG PO TABS
5.0000 mg | ORAL_TABLET | ORAL | 1 refills | Status: DC
Start: 2020-04-13 — End: 2020-10-31

## 2020-04-13 NOTE — Telephone Encounter (Addendum)
Reports he has developed a itchy rash on arms, chest and neck area. Is taking Benadryl, Claritin and topical hydrocortisone cream. In past for this he took prednisone. Asking if MD will order prednisone for him? Per Dr. Benay Spice: Will prescribe short course of prednisone. Encouraged him to use 30+ sunscreen in future when outside. Needs to obtain a PCP to manage issues like this. Patient notified and agrees. Reports he calls the PCP office and they never call back. Suggested he may need to look for another one.

## 2020-04-18 MED FILL — ELIQUIS 5 MG TABLET: 5 | 30 days supply | Qty: 60 | Fill #1

## 2020-04-25 DIAGNOSIS — N5239 Other post-surgical erectile dysfunction: Secondary | ICD-10-CM | POA: Insufficient documentation

## 2020-04-30 MED FILL — predniSONE 50 MG TABS: 50 | 1 days supply | Qty: 3 | Fill #0

## 2020-05-01 MED FILL — predniSONE 50 MG TABS: 50 | 1 days supply | Qty: 3 | Fill #0

## 2020-05-22 ENCOUNTER — Inpatient Hospital Stay: Payer: Medicaid Other

## 2020-05-22 ENCOUNTER — Inpatient Hospital Stay: Payer: Medicaid Other | Attending: Oncology

## 2020-05-22 DIAGNOSIS — R591 Generalized enlarged lymph nodes: Secondary | ICD-10-CM | POA: Insufficient documentation

## 2020-05-22 DIAGNOSIS — Z79899 Other long term (current) drug therapy: Secondary | ICD-10-CM | POA: Insufficient documentation

## 2020-05-22 DIAGNOSIS — C20 Malignant neoplasm of rectum: Secondary | ICD-10-CM | POA: Insufficient documentation

## 2020-05-22 DIAGNOSIS — N529 Male erectile dysfunction, unspecified: Secondary | ICD-10-CM | POA: Insufficient documentation

## 2020-05-22 DIAGNOSIS — C787 Secondary malignant neoplasm of liver and intrahepatic bile duct: Secondary | ICD-10-CM | POA: Insufficient documentation

## 2020-05-22 DIAGNOSIS — R21 Rash and other nonspecific skin eruption: Secondary | ICD-10-CM | POA: Insufficient documentation

## 2020-05-22 DIAGNOSIS — J45909 Unspecified asthma, uncomplicated: Secondary | ICD-10-CM | POA: Insufficient documentation

## 2020-05-24 ENCOUNTER — Inpatient Hospital Stay: Payer: Medicaid Other | Admitting: Oncology

## 2020-05-24 ENCOUNTER — Telehealth: Payer: Self-pay | Admitting: Oncology

## 2020-05-24 NOTE — Telephone Encounter (Signed)
Rescheduled 07/20 missed appointments to 07/26 per patient's request. Patient received updated calender.

## 2020-05-25 ENCOUNTER — Encounter: Payer: Self-pay | Admitting: Physical Therapy

## 2020-05-25 ENCOUNTER — Ambulatory Visit: Payer: Medicaid Other | Attending: Urology | Admitting: Physical Therapy

## 2020-05-25 ENCOUNTER — Other Ambulatory Visit: Payer: Self-pay

## 2020-05-25 DIAGNOSIS — R278 Other lack of coordination: Secondary | ICD-10-CM | POA: Diagnosis present

## 2020-05-25 DIAGNOSIS — M6281 Muscle weakness (generalized): Secondary | ICD-10-CM | POA: Diagnosis present

## 2020-05-25 DIAGNOSIS — C20 Malignant neoplasm of rectum: Secondary | ICD-10-CM

## 2020-05-25 DIAGNOSIS — M62838 Other muscle spasm: Secondary | ICD-10-CM | POA: Insufficient documentation

## 2020-05-25 NOTE — Therapy (Signed)
[facility] [address] [place], [place], [address] Phone: [phone]   Fax:  [phone]  Physical Therapy Evaluation  Patient Details  Name: Bradley Colon MRN: [id number] Date of Birth: Sep 11, 1971 Referring Provider (PT): Dr. Hillery Hunter   Encounter Date: 05/25/2020   PT End of Session - 05/25/20 1149    Visit Number 1    Date for PT Re-Evaluation 08/17/20    Authorization Type [employer]    PT Start Time 1100    PT Stop Time 1145    PT Time Calculation (min) 45 min    Activity Tolerance Patient tolerated treatment well    Behavior During Therapy [facility] for tasks assessed/performed           Past Medical History:  Diagnosis Date  . Aggressiveness 12/28/2018  . Asthma   . Cancer ([place])   . Cervical radiculopathy   . Chronic back pain   . Kidney stones   . Reactive airway disease     Past Surgical History:  Procedure Laterality Date  . EPIDURAL BLOCK INJECTION    . laproscopic anterior resection for rectal cancer   11/18/2019  . SHOULDER SURGERY      There were no vitals filed for this visit.    Subjective Assessment - 05/25/20 1109    Subjective When I hae an erection the muscles in the pelvis hurt.    Patient Stated Goals reduce pelvic pain    Currently in Pain? Yes    Pain Score 5     Pain Location Pelvis    Pain Orientation Mid    Pain Descriptors / Indicators Discomfort    Pain Type Acute pain    Pain Onset More than a month ago    Pain Frequency Intermittent    Aggravating Factors  when have an erection; sitting    Pain Relieving Factors keep erection in the middle, sitting on left    Multiple Pain Sites No              [facility] PT Assessment - 05/25/20 0001      Assessment   Medical Diagnosis M62.89 Pelvic floor dysfunction    Referring Provider (PT) Dr. Hillery Hunter    Onset Date/Surgical Date 11/18/19    Prior Therapy none      Precautions   Precautions  Other (comment)    Precaution Comments rectal cancer with radiation and chemotherapy      Restrictions   Weight Bearing Restrictions No      Prior Function   Level of Independence Independent    Vocation On disability    Leisure walking dogs      Cognition   Overall Cognitive Status Within Functional Limits for tasks assessed      Posture/Postural Control   Posture/Postural Control Postural limitations    Posture Comments sitting on left buttocks      ROM / Strength   AROM / PROM / Strength AROM;PROM;Strength      AROM   Lumbar - Right Side Bend decreased by 25%      PROM   Right Hip External Rotation  45    Left Hip External Rotation  30      Strength   Overall Strength Comments bulges abdomen when contracting    Right Hip Internal Rotation 4/5    Right Hip ABduction 3/5    Right Hip ADduction 3/5    Left Hip Extension 3/5    Left Hip  Internal Rotation 4/5    Left Hip ABduction 3/5    Left Hip ADduction 3/5      Palpation   Palpation comment tenderness located in the levator ani and lower abdomen; decreased movement of the surgical scar on the abdomen                      Objective measurements completed on examination: See above findings.     Pelvic Floor Special Questions - 05/25/20 0001    Urinary Leakage No    Fluid intake lots of water    Skin Integrity Intact    Perineal Body/Introitus  Other    Perineal Body/Introitus other tender                    PT Education - 05/25/20 1148    Education Details how to use a tennsi ball to massage the pelvic floor muslces and scar massage    Person(s) Educated Patient    Methods Explanation;Demonstration;Verbal cues;Handout    Comprehension Verbalized understanding;Returned demonstration            PT Short Term Goals - 05/25/20 1207      PT SHORT TERM GOAL #1   Title "Independent with initial HEP    Time 4    Period Weeks    Status New    Target Date 06/22/20      PT SHORT  TERM GOAL #2   Title understand how to perform scar massage to improve mobility    Time 4    Period Weeks    Status New    Target Date 06/22/20      PT SHORT TERM GOAL #3   Title able to sit on both buttocks due to muscle spasms decresaed >/= 25%    Time 4    Period Weeks    Status New    Target Date 06/22/20             PT Long Term Goals - 05/25/20 1212      PT LONG TERM GOAL #1   Title Independent with advanced  HEP    Time 12    Period Weeks    Status New    Target Date 08/17/20      PT LONG TERM GOAL #2   Title able to have an erection and not have to hold in midline due to increased in pelvic floor strength    Time 12    Period Weeks    Status New    Target Date 08/17/20      PT LONG TERM GOAL #3   Title able to sit for 1 hour on each buttocks with pain decreased to minimal    Time 12    Period Weeks    Status New    Target Date 08/17/20      PT LONG TERM GOAL #4   Title able to engage his abdominals correctly to strengthen the pelvic floor    Time 12    Period Weeks    Status New    Target Date 08/17/20      PT LONG TERM GOAL #5   Title -----                  Plan - 05/25/20 1152    Clinical Impression Statement Patient is a 49 year old male with pelvic floor dysfunction and erectile dysfunction. Patient is status post rectal cancer on 4/24/202. He had Xeloda and  radiation from 04/26/2019 to 07/05/2019. Patient had LAR sugery on 11/18/2019 and wears an ilieostomy bag. Patient is presently ejecting his penis with Tri-mix for erections. When patient has an erection there is pain in the left levator ani and he must hold it in midline due to the muscles not able to hold in place. Patient has pain with sitting and sits on left buttocks. Patient has decreased bilateral hip external rotation. His abdominal surgical scar has decreased mobility. His hip abductors, adductors and extensors is 3/5. When he contracts his abdominals he will bulge and increase  pelvic pressure. Palpable tenderness located on the bilateral levator ani. Patient will benefit from skilled therapy to reduce his pelvic pain and improve muscles strength and coordination.    Personal Factors and Comorbidities Comorbidity 2;Time since onset of injury/illness/exacerbation;Sex    Comorbidities rectal cancer 02/25/2019 with radiation and chemotherapy; LAR 11/18/2019    Examination-Activity Limitations Sit    Examination-Participation Restrictions Interpersonal Relationship    Stability/Clinical Decision Making Evolving/Moderate complexity    Clinical Decision Making Low    Rehab Potential Excellent    PT Frequency 1x / week    PT Duration 12 weeks    PT Treatment/Interventions ADLs/Self Care Home Management;Biofeedback;Electrical Stimulation;Therapeutic activities;Therapeutic exercise;Neuromuscular re-education;Manual techniques;Patient/family education;Scar mobilization;Dry needling;Joint Manipulations;Spinal Manipulations    PT Next Visit Plan hip stretches, hip mobiization, scar mobilization, STW to levator ani; diaphragmatic breathing to bulge the pelvic floor, rib mobility, lower abdominal engagement    Consulted and Agree with Plan of Care Patient           Patient will benefit from skilled therapeutic intervention in order to improve the following deficits and impairments:  Decreased coordination, Increased fascial restricitons, Pain, Decreased endurance, Decreased strength, Decreased mobility, Decreased activity tolerance, Decreased scar mobility, Impaired flexibility, Increased muscle spasms  Visit Diagnosis: Muscle weakness (generalized) - Plan: PT plan of care cert/re-cert  Other muscle spasm - Plan: PT plan of care cert/re-cert  Other lack of coordination - Plan: PT plan of care cert/re-cert  Rectal cancer [facility]) - Plan: PT plan of care cert/re-cert     Problem List Patient Active Problem List   Diagnosis Date Noted  . Port-A-Cath in place 04/12/2020  .  Rectal cancer ([place]) 03/02/2019  . Reactive airway disease 03/02/2019  . Vegetarian 03/02/2019  . Adjustment disorder with mixed anxiety and depressed mood 03/02/2019  . 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 Change in bowel habits 01/31/2019  . Constipation 01/31/2019  . Grade III hemorrhoids 01/31/2019  . Rectal pain 01/31/2019  . Fibromyalgia 12/18/2017  . Dysesthesia affecting both sides of body 12/18/2017  . Spondylosis without myelopathy or radiculopathy, lumbar region 12/18/2017  . Chronic post-traumatic stress disorder (PTSD) 05/25/2017  . Other fatigue 07/27/2016  . Chronic left shoulder pain 07/22/2016  . Chronic neck pain 11/19/2015  . Chronic low back pain 09/18/2014    Earlie Counts, PT 05/25/20 12:21 PM   [facility] [address], [place], [place], [address] Phone: [phone]   Fax:  [phone]  Name: Bradley Colon MRN: [id number] Date of Birth: [birth year]

## 2020-05-28 ENCOUNTER — Inpatient Hospital Stay: Payer: Medicaid Other

## 2020-05-28 ENCOUNTER — Inpatient Hospital Stay (HOSPITAL_BASED_OUTPATIENT_CLINIC_OR_DEPARTMENT_OTHER): Payer: Medicaid Other | Admitting: Oncology

## 2020-05-28 ENCOUNTER — Other Ambulatory Visit: Payer: Self-pay

## 2020-05-28 VITALS — BP 152/81 | HR 65 | Temp 97.3°F | Resp 18 | Wt 217.4 lb

## 2020-05-28 DIAGNOSIS — Z95828 Presence of other vascular implants and grafts: Secondary | ICD-10-CM

## 2020-05-28 DIAGNOSIS — R591 Generalized enlarged lymph nodes: Secondary | ICD-10-CM | POA: Diagnosis not present

## 2020-05-28 DIAGNOSIS — R21 Rash and other nonspecific skin eruption: Secondary | ICD-10-CM | POA: Diagnosis not present

## 2020-05-28 DIAGNOSIS — C20 Malignant neoplasm of rectum: Secondary | ICD-10-CM | POA: Diagnosis not present

## 2020-05-28 DIAGNOSIS — Z79899 Other long term (current) drug therapy: Secondary | ICD-10-CM | POA: Diagnosis not present

## 2020-05-28 DIAGNOSIS — J45909 Unspecified asthma, uncomplicated: Secondary | ICD-10-CM | POA: Diagnosis not present

## 2020-05-28 DIAGNOSIS — C787 Secondary malignant neoplasm of liver and intrahepatic bile duct: Secondary | ICD-10-CM | POA: Diagnosis not present

## 2020-05-28 DIAGNOSIS — N529 Male erectile dysfunction, unspecified: Secondary | ICD-10-CM | POA: Diagnosis not present

## 2020-05-28 LAB — CEA (IN HOUSE-CHCC): CEA (CHCC-In House): 4.87 ng/mL (ref 0.00–5.00)

## 2020-05-28 MED ORDER — OXYCODONE HCL 5 MG PO TABS: 5.0000 mg | ORAL_TABLET | Freq: Every day | ORAL | 0 refills | Status: DC | PRN

## 2020-05-28 MED ORDER — HEPARIN SOD (PORK) LOCK FLUSH 100 UNIT/ML IV SOLN
250.0000 [IU] | Freq: Once | INTRAVENOUS | Status: AC
  Administered 2020-05-28: 500 [IU]
  Filled 2020-05-28: qty 5

## 2020-05-28 MED ORDER — SODIUM CHLORIDE 0.9% FLUSH
10.0000 mL | Freq: Once | INTRAVENOUS | Status: AC
  Administered 2020-05-28: 10 mL
  Filled 2020-05-28: qty 10

## 2020-05-28 MED FILL — oxyCODONE HCL 5 MG TABS: 5 | 30 days supply | Qty: 30 | Fill #0

## 2020-05-28 NOTE — Progress Notes (Signed)
[facility] OFFICE PROGRESS NOTE   Diagnosis: Rectal cancer  INTERVAL HISTORY:   Mr. Lindroth returns after missing a scheduled visit last week.  Good appetite and energy level.  The colostomy is functioning well.  Erectile dysfunction responded to injection therapy prescribed by urology.  He reports having a spontaneous erection yesterday.  He develops "spasms "at the perineum when he uses the penile injection.  This has been relieved with oxycodone.  He is participating in pelvic physical therapy. Mr. Paolini is scheduled for follow-up imaging at [facility] on 06/13/2019 prior to reversal of the ileostomy.  He had a rash over the mid back after eating "sugar ".  This improved with prednisone.  Objective:  Vital signs in last 24 hours:  Blood pressure (!) 152/81, pulse 65, temperature (!) 97.3 F (36.3 C), temperature source Temporal, resp. rate 18, weight (!) 217 lb 6.4 oz (98.6 kg), SpO2 100 %.    Lymphatics: No cervical, supraclavicular, axillary, or inguinal nodes Resp: Lungs clear bilaterally Cardio: Regular rate and rhythm GI: Right abdomen ileostomy, no hepatomegaly, nontender Vascular: No leg edema Skin: Resolving maculopapular rash over the mid back  Portacath/PICC-without erythema  Lab Results:  Lab Results  Component Value Date   WBC 9.3 01/02/2020   HGB 12.6 (L) 01/02/2020   HCT 40.7 01/02/2020   MCV 88.9 01/02/2020   PLT 447 (H) 01/02/2020   NEUTROABS 6.6 01/02/2020    CMP  Lab Results  Component Value Date   NA 141 01/02/2020   K 4.1 01/02/2020   CL 107 01/02/2020   CO2 25 01/02/2020   GLUCOSE 109 (H) 01/02/2020   BUN 12 01/02/2020   CREATININE 1.27 (H) 01/02/2020   CALCIUM 9.8 01/02/2020   PROT 7.9 01/02/2020   ALBUMIN 3.9 01/02/2020   AST 18 01/02/2020   ALT 24 01/02/2020   ALKPHOS 72 01/02/2020   BILITOT 1.0 01/02/2020   GFRNONAA >60 01/02/2020   GFRAA >60 01/02/2020    Medications: I have reviewed the patient's current  medications.   Assessment/Plan:  1. Rectal cancer, clinical stage III (T3cN2) ? Rectal mass extending from 4-9 cm on colonoscopy 02/15/2019 with a biopsy confirming invasive adenocarcinoma ? CTs 02/23/2019- thickening of the low rectum without a discrete mass, small bilateral pulmonary nodules, some present on imaging from 2014 ? Pelvic MRI 02/25/2019- T3cN2 tumor beginning at 4.9 cm from the anal verge and 0 cm from the internal anal sphincter, tumor extends through the muscularis propria, 2.  Rectal lymph nodes and 2 enlarged right internal iliac nodes ? [facility] at [facility] 04/19/2019- new subcentimeter right hepatic lesion concerning for metastasis, stable low rectal mass with prominent mesorectal lymph nodes, stable pathologic enlarged right internal iliac nodes, stable pulmonary nodules ? Xeloda/radiation beginning 04/26/2019, completed 07/05/2019 ? CT 07/26/2019-growth of previously described right hepatic lesion, slight decrease in pelvic lymphadenopathy and rectal thickening ? 08/05/2019-week 1 bolus 5-FU/leucovorin (200 mg/m) ? 08/12/2019-week to bolus 5-FU/leucovorin (400 mg/m) ? 10/16/ 2020-week 3 bolus 5-FU/leucovorin (400 mg/m) and oxaliplatin (42.5 mg/m) ? CT chest 10/04/2019-slight increase in bilateral pulmonary nodules concerning for metastases ? MRI abdomen 10/06/2019-right hepatic lesion consistent with metastasis, wall thickening of the low anterior rectum, no new or enlarging adenopathy, stable mesorectal and right internal iliac nodes ? PET scan 10/14/2019-pulmonary nodules below PET resolution, hypermetabolic right posterior hepatic lesion, mildly hypermetabolic partially calcified right internal iliac chain lymphadenopathy, mild gastric hypermetabolism and thickening-likely gastritis ? 11/18/2019-LAR with diverting loop ileostomy and ablation of solitary liver metastasis (2 cm  segment 7), moderately differentiated adenocarcinoma invading into soft tissue, LVI present, perineural invasion  present, 5/15 lymph nodes positive, 4 tumor deposits, absent treatment effect-score 3,ypT3 ypN2a  ? CT chest 01/16/2020-increase in the size of a dominant right lower lobe nodule, unchanged left upper lobe and left lower lobe nodules ? CT chest 04/11/2020-enlargement of bilateral lung nodules, and new nodules 2. Pain secondary to #1, improved 3. Reactive airways disease/asthma 4. Report of multiple allergens 5. Port-A-Cath related DVT, right IJ, 09/30/2019-apixaban, converted to Lovenox surrounding surgery January 2021, changed back to apixaban 12/30/2019 6. Erectile dysfunction following treatment for rectal cancer-followed by urology    Disposition: Mr. Abshier appears stable.  I encouraged him to continue pelvic physical therapy.  He will undergo imaging on 06/12/2020 prior to scheduling ileostomy reversal.  He is scheduled for a restaging PET prior to an office visit during the week of 07/09/2020.  We will delay this for several weeks as needed pending timing of the ileostomy reversal.  He will contact us with the surgical date.  I refilled his prescription for oxycodone.  Betsy Coder, MD  05/28/2020  12:54 PM

## 2020-05-28 NOTE — Patient Instructions (Signed)

## 2020-05-29 ENCOUNTER — Telehealth: Payer: Self-pay | Admitting: Oncology

## 2020-05-29 NOTE — Telephone Encounter (Signed)
Scheduled appts per 7/26 los. Left voicemail with appt date and time.

## 2020-05-31 ENCOUNTER — Other Ambulatory Visit: Payer: Self-pay

## 2020-05-31 ENCOUNTER — Ambulatory Visit: Payer: Medicaid Other | Admitting: Physical Therapy

## 2020-05-31 ENCOUNTER — Encounter: Payer: Self-pay | Admitting: Physical Therapy

## 2020-05-31 DIAGNOSIS — R278 Other lack of coordination: Secondary | ICD-10-CM

## 2020-05-31 DIAGNOSIS — M62838 Other muscle spasm: Secondary | ICD-10-CM

## 2020-05-31 DIAGNOSIS — C20 Malignant neoplasm of rectum: Secondary | ICD-10-CM

## 2020-05-31 DIAGNOSIS — M6281 Muscle weakness (generalized): Secondary | ICD-10-CM | POA: Diagnosis not present

## 2020-05-31 NOTE — Patient Instructions (Signed)

## 2020-05-31 NOTE — Therapy (Signed)
[facility] [address] [place], [place], [address] Phone: [phone]   Fax:  [phone]  Physical Therapy Treatment  Patient Details  Name: Bradley Colon MRN: [id number] Date of Birth: [birth year] Referring Provider (PT): Dr. Hillery Hunter   Encounter Date: 05/31/2020   PT End of Session - 05/31/20 1114    Visit Number 2    Date for PT Re-Evaluation 08/17/20    Authorization Type [employer]    Authorization - Visit Number 2    Authorization - Number of Visits 27    PT Start Time 1030    PT Stop Time 1110    PT Time Calculation (min) 40 min    Activity Tolerance Patient tolerated treatment well    Behavior During Therapy [facility] for tasks assessed/performed           Past Medical History:  Diagnosis Date  . Aggressiveness 12/28/2018  . Asthma   . Cancer ([place])   . Cervical radiculopathy   . Chronic back pain   . Kidney stones   . Reactive airway disease     Past Surgical History:  Procedure Laterality Date  . EPIDURAL BLOCK INJECTION    . laproscopic anterior resection for rectal cancer   11/18/2019  . SHOULDER SURGERY      There were no vitals filed for this visit.   Subjective Assessment - 05/31/20 1032    Subjective I am getting the spasm in the right buttocks. Patient is getting erections without the medicine.    Patient Stated Goals reduce pelvic pain    Currently in Pain? Yes    Pain Score 5     Pain Location Perineum   pelvic floor   Pain Orientation Mid;Right    Pain Descriptors / Indicators Aching;Throbbing    Pain Type Acute pain    Pain Onset More than a month ago    Pain Frequency Intermittent    Aggravating Factors  when having an erection, sitting    Pain Relieving Factors medication, massage gun                             [facility] Adult PT Treatment/Exercise - 05/31/20 0001      Self-Care   Self-Care Other Self-Care Comments    Other Self-Care  Comments  using the male model and educated patient on the anatomy and how it affects erections      Neuro Re-ed    Neuro Re-ed Details  relaxation breathing with dry needling to relax the pelvic floor      Manual Therapy   Manual Therapy Soft tissue mobilization    Manual therapy comments asess the pelvic floor muscles for dry needling    Soft tissue mobilization bulbococcygeus, ishiocavernosus, levator ani to elongate muscles after dry needling            Trigger Point Dry Needling - 05/31/20 0001    Consent Given? Yes    Education Handout Provided Yes    Other Dry Needling right bulbococcygeus, ishiocavernosus, levator ani   right, elongation and trigger point response               PT Education - 05/31/20 1113    Education Details information on dry needling    Person(s) Educated Patient    Methods Explanation;Handout    Comprehension Verbalized understanding            PT Short Term  Goals - 05/31/20 1119      PT SHORT TERM GOAL #1   Title "Independent with initial HEP    Time 4    Period Weeks    Status On-going      PT SHORT TERM GOAL #2   Title understand how to perform scar massage to improve mobility    Time 4    Period Weeks    Status Achieved      PT SHORT TERM GOAL #3   Title able to sit on both buttocks due to muscle spasms decresaed >/= 25%    Time 4    Period Weeks    Status On-going             PT Long Term Goals - 05/25/20 1212      PT LONG TERM GOAL #1   Title Independent with advanced  HEP    Time 12    Period Weeks    Status New    Target Date 08/17/20      PT LONG TERM GOAL #2   Title able to have an erection and not have to hold in midline due to increased in pelvic floor strength    Time 12    Period Weeks    Status New    Target Date 08/17/20      PT LONG TERM GOAL #3   Title able to sit for 1 hour on each buttocks with pain decreased to minimal    Time 12    Period Weeks    Status New    Target Date 08/17/20        PT LONG TERM GOAL #4   Title able to engage his abdominals correctly to strengthen the pelvic floor    Time 12    Period Weeks    Status New    Target Date 08/17/20      PT LONG TERM GOAL #5   Title -----                 Plan - 05/31/20 1037    Clinical Impression Statement Patient has tightness in the bulbococcygeus, ischiocavernosus, and levator ani on the right. After the dry needling he did not feel like he was sitting on a glof ball and the muscles had increased mobility. Patient is using a massage gun to the pelvic floor muscles to release the muscle spasms. Patient understands when he tightens his face or feet that will tighten the pelvic floor muscles. Patient is doing his breathing exercise to relax the pelvic floor. Patient reports he is not having erections at different times without using the injections but not strong enough for intercourse. Patient will benefit from skilled therapy to reduce his pelvic pain and improve muscles strength and coordination.    Personal Factors and Comorbidities Comorbidity 2;Time since onset of injury/illness/exacerbation;Sex    Comorbidities rectal cancer 02/25/2019 with radiation and chemotherapy; LAR 11/18/2019    Examination-Activity Limitations Sit    Examination-Participation Restrictions Interpersonal Relationship    Stability/Clinical Decision Making Evolving/Moderate complexity    Rehab Potential Excellent    PT Frequency 1x / week    PT Duration 12 weeks    PT Treatment/Interventions ADLs/Self Care Home Management;Biofeedback;Electrical Stimulation;Therapeutic activities;Therapeutic exercise;Neuromuscular re-education;Manual techniques;Patient/family education;Scar mobilization;Dry needling;Joint Manipulations;Spinal Manipulations    PT Next Visit Plan hip stretches, hip mobiization, scar mobilization, STW to levator ani; diaphragmatic breathing to bulge the pelvic floor, rib mobility, lower abdominal engagement    Consulted and  Agree with Plan  of Care Patient           Patient will benefit from skilled therapeutic intervention in order to improve the following deficits and impairments:  Decreased coordination, Increased fascial restricitons, Pain, Decreased endurance, Decreased strength, Decreased mobility, Decreased activity tolerance, Decreased scar mobility, Impaired flexibility, Increased muscle spasms  Visit Diagnosis: Muscle weakness (generalized)  Other muscle spasm  Other lack of coordination  Rectal cancer [facility])     Problem List Patient Active Problem List   Diagnosis Date Noted  . Port-A-Cath in place 04/12/2020  . Rectal cancer ([place]) 03/02/2019  . Reactive airway disease 03/02/2019  . Vegetarian 03/02/2019  . Adjustment disorder with mixed anxiety and depressed mood 03/02/2019  . 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 Change in bowel habits 01/31/2019  . Constipation 01/31/2019  . Grade III hemorrhoids 01/31/2019  . Rectal pain 01/31/2019  . Fibromyalgia 12/18/2017  . Dysesthesia affecting both sides of body 12/18/2017  . Spondylosis without myelopathy or radiculopathy, lumbar region 12/18/2017  . Chronic post-traumatic stress disorder (PTSD) 05/25/2017  . Other fatigue 07/27/2016  . Chronic left shoulder pain 07/22/2016  . Chronic neck pain 11/19/2015  . Chronic low back pain 09/18/2014    Earlie Counts, PT 05/31/20 11:20 AM   [facility] [address], [place], [place], [address] Phone: [phone]   Fax:  [phone]  Name: Iden Stripling MRN: [id number] Date of Birth: 1971/05/20

## 2020-06-03 HISTORY — PX: ILEOSTOMY REVISION: SHX1785

## 2020-06-07 ENCOUNTER — Ambulatory Visit: Payer: Medicaid Other | Admitting: Physical Therapy

## 2020-06-11 MED FILL — predniSONE 50 MG TABS: 50 | 1 days supply | Qty: 3 | Fill #0

## 2020-06-15 ENCOUNTER — Encounter: Payer: Self-pay | Admitting: Oncology

## 2020-06-15 ENCOUNTER — Telehealth: Payer: Self-pay

## 2020-06-15 NOTE — Telephone Encounter (Addendum)
Bradley Brooks NP for [facility] called today because patient is having surgery 06/22/20 and needs to stop eliqus before surgery. Ok to stop for five days per Dr Benay Spice. Also asked if patient needs to be on a lovenox bridge while not taking eliqus. Dr Benay Spice states that it was not nesseary.

## 2020-06-18 ENCOUNTER — Other Ambulatory Visit: Payer: Self-pay | Admitting: *Deleted

## 2020-06-18 MED ORDER — ALPRAZOLAM 0.25 MG PO TABS: 0.2500 mg | ORAL_TABLET | Freq: Every evening | ORAL | 0 refills | Status: DC | PRN

## 2020-06-18 MED FILL — ALPRAZolam 0.25 MG TABS: 0.25 | 15 days supply | Qty: 30 | Fill #0

## 2020-06-21 ENCOUNTER — Telehealth: Payer: Self-pay | Admitting: Physical Therapy

## 2020-06-21 ENCOUNTER — Ambulatory Visit: Payer: Medicaid Other | Attending: Urology | Admitting: Physical Therapy

## 2020-06-21 NOTE — Telephone Encounter (Signed)
Spoke to patient about his appointment that he is running late for and it was too late for the appointment due to only 15 minutes left. Patient informed therapist that he forgot about time due to just finding out the time he was having surgery tomorrow. Therapist advised patient to call to resume therapy when his surgeon gives him clearance.  Earlie Counts, PT @8 /19/2021@ 3:17 PM

## 2020-06-25 ENCOUNTER — Ambulatory Visit: Payer: Medicaid Other | Admitting: Physical Therapy

## 2020-06-27 ENCOUNTER — Ambulatory Visit: Payer: Medicaid Other | Admitting: Physical Therapy

## 2020-06-29 MED FILL — oxyCODONE HCL 5 MG TABS: 5 | 5 days supply | Qty: 20 | Fill #0

## 2020-07-10 ENCOUNTER — Ambulatory Visit (HOSPITAL_COMMUNITY): Admission: RE | Admit: 2020-07-10 | Payer: Medicaid Other | Source: Ambulatory Visit

## 2020-07-12 ENCOUNTER — Telehealth: Payer: Self-pay | Admitting: Oncology

## 2020-07-12 ENCOUNTER — Encounter: Payer: Self-pay | Admitting: *Deleted

## 2020-07-12 ENCOUNTER — Inpatient Hospital Stay: Payer: Medicaid Other | Attending: Oncology

## 2020-07-12 ENCOUNTER — Telehealth: Payer: Self-pay | Admitting: *Deleted

## 2020-07-12 ENCOUNTER — Inpatient Hospital Stay: Payer: Medicaid Other | Admitting: Oncology

## 2020-07-12 NOTE — Telephone Encounter (Signed)
Scheduled appt per 9/09 sch msg - left message for patient with appt date and time

## 2020-07-12 NOTE — Progress Notes (Signed)
Patient was "no show" for his flush and f/u appointment today. He cancelled his 07/10/20 PET scan as well. Scheduling message sent to reschedule for 2-3 weeks.

## 2020-07-12 NOTE — Telephone Encounter (Signed)
Called and left VM that he was not able to come to appointment today. Having difficult time recovering from the ileostomy takedown. In a lot of pain and does not have bowel control at this time. Needs longer to recover. Having spasms and taking pain meds and sitz baths. His bottom is raw and painful.

## 2020-07-18 ENCOUNTER — Ambulatory Visit: Payer: Medicaid Other | Attending: Urology | Admitting: Physical Therapy

## 2020-07-18 DIAGNOSIS — R278 Other lack of coordination: Secondary | ICD-10-CM | POA: Insufficient documentation

## 2020-07-18 DIAGNOSIS — C20 Malignant neoplasm of rectum: Secondary | ICD-10-CM | POA: Insufficient documentation

## 2020-07-18 DIAGNOSIS — M6281 Muscle weakness (generalized): Secondary | ICD-10-CM | POA: Insufficient documentation

## 2020-07-18 DIAGNOSIS — M62838 Other muscle spasm: Secondary | ICD-10-CM | POA: Insufficient documentation

## 2020-07-20 ENCOUNTER — Ambulatory Visit: Payer: Medicaid Other | Admitting: Physical Therapy

## 2020-07-20 ENCOUNTER — Other Ambulatory Visit: Payer: Self-pay

## 2020-07-20 ENCOUNTER — Encounter: Payer: Self-pay | Admitting: Physical Therapy

## 2020-07-20 DIAGNOSIS — M6281 Muscle weakness (generalized): Secondary | ICD-10-CM

## 2020-07-20 DIAGNOSIS — M62838 Other muscle spasm: Secondary | ICD-10-CM | POA: Diagnosis present

## 2020-07-20 DIAGNOSIS — C20 Malignant neoplasm of rectum: Secondary | ICD-10-CM | POA: Diagnosis present

## 2020-07-20 DIAGNOSIS — R278 Other lack of coordination: Secondary | ICD-10-CM | POA: Diagnosis present

## 2020-07-20 NOTE — Patient Instructions (Addendum)
Toileting Techniques for Bowel Movements    An Evacuation/Defecation Plan   Here are the 4 basic points:  1. Lean forward enough for your elbows to rest on your knees 2. Support your feet on the floor or use a low stool if your feet don't touch the floor  3. Push out your belly as if you have swallowed a beach ball--you should feel a widening of your waist. "Belly Big, Belly Hard" 4. Open and relax your pelvic floor muscles, rather than tightening around the anus  While you are sitting on the toilet pay attention to the following areas: . Jaw and mouth position- relaxed not clenched . Angle of your hips - leaning slightly forward . Whether your feet touch the ground or not - should be flat and supported . Arm placement - rest against your thighs . Spine position - flat back . Waist . Breathing - exhale as you push (like blowing up a balloon or try using other sounds such as ahhhh, shhhhh, ohhhh or grrrrrrr) . Belly - hard and tight as you push . Anus (opening of the anal canal) - relaxed and open as you push . Anus - Tighten and lift pulling the muscle back in after you are done or if taking a break  If you are not successful after 10-15 minutes, try again later.  Avoid negative self-talk about your toileting experience.   Read this for more details and ask your PT if you need suggestions for adjustments or limitations:  1) Sitting on the toilet  a) Make sure your feet are supported - flat on the floor or step stool b) Many people find it effective to lean forward or raise their knees.  Propping your feet on a step stool (Squatty Potty is a brand name) can help the muscles around the anus to relax  c) When you lean forward, place your forearms on your thighs for support  2) Relaxing a) Breathe deeply and slowly in through your nose and out through your mouth. b) To become aware of how to relax your muscles, contracting and releasing muscles can be helpful.  Pull your pelvic floor  muscles in tightly by using the image of holding back gas, or closing around the anus (visualize making a circle smaller) and lifting the anus up and in.  Then release the muscles and your anus should drop down and feel open. Repeat 5 times ending with the feeling of relaxation. c) Keep your pelvic floor muscles relaxed; let your belly bulge out. d) The digestive tract starts at the mouth and ends at the anal opening, so be sure to relax both ends of the tube.  Place your tongue on the roof of your mouth with your teeth separated.  This helps relax your mouth and will help to relax the anus at the same time.  3) Emptying (defecation) a) Keep your pelvic floor and sphincter relaxed, then bulge your anal muscles.  Make the anal opening wide.  b) Stick your belly out as if you have swallowed a beach ball. c) Make your belly wall hard using your belly muscles while continuing to breathe. Doing this makes it easier to open your anus. d) Breath out and give a grunt (or try using other sounds such as ahhhh, shhhhh, ohhhh or grrrrrrr). e)  Can also try to act as if you are blowing up a balloon as you push  4) Finishing a) As you finish your bowel movement, pull the pelvic floor muscles  up and in.  This will leave your anus in the proper place rather than remaining pushed out and down. If you leave your anus pushed out and down, it will start to feel as though that is normal and give you incorrect signals about needing to have a bowel movement.   Abdominal Massage for Constipation #celebratemuliebrity by Pecola Leisure on you tube [facility] [address] [place], [place] [address] Phone # [phone] Fax [phone]  Access Code: [email or web address] URL: [email or web address] Date: 07/20/2020 Prepared by: Earlie Counts  Exercises Supine Double Knee to Chest - 3-4 x daily - 7 x weekly - 1 sets - 1 reps - 1-2 min hold

## 2020-07-20 NOTE — Therapy (Signed)
[facility] [address], [place], [place], [address] Phone: [phone]   Fax:  [phone]  Physical Therapy Treatment  Patient Details  Name: Bradley Colon MRN: [id number] Date of Birth: 26-Jul-1971 Referring Provider (PT): Dr. Hillery Hunter   Encounter Date: 07/20/2020   PT End of Session - 07/20/20 1202    Visit Number 3    Date for PT Re-Evaluation 10/12/20    Authorization Type [employer]    Authorization - Visit Number 3    Authorization - Number of Visits 27    PT Start Time 1115   came late due to BM   PT Stop Time 1145    PT Time Calculation (min) 30 min    Activity Tolerance Patient tolerated treatment well    Behavior During Therapy [facility] for tasks assessed/performed           Past Medical History:  Diagnosis Date  . Aggressiveness 12/28/2018  . Asthma   . Cancer ([place])   . Cervical radiculopathy   . Chronic back pain   . Kidney stones   . Reactive airway disease     Past Surgical History:  Procedure Laterality Date  . EPIDURAL BLOCK INJECTION    . laproscopic anterior resection for rectal cancer   11/18/2019  . SHOULDER SURGERY      There were no vitals filed for this visit.   Subjective Assessment - 07/20/20 1116    Subjective I had my ileostomy reversal on 8/20 and now I still have the pain. When I eat the stool will come out. I get the stool out when I pretend to cough. I am not able to finish the BM. I only go to the bathroom in the morning. I am sore from clenching.    Patient Stated Goals reduce pelvic pain, able to have a bowel movement    Currently in Pain? Yes    Pain Score 5     Pain Location Perineum    Pain Orientation Mid    Pain Descriptors / Indicators Constant;Cramping    Pain Type Acute pain    Pain Onset More than a month ago    Pain Frequency Constant    Aggravating Factors  sitting long period of time,clenching the muscles like muscle spasms    Pain  Relieving Factors afternoon    Multiple Pain Sites No              [facility] PT Assessment - 07/20/20 0001      Assessment   Medical Diagnosis M62.89 Pelvic floor dysfunction    Referring Provider (PT) Dr. Hillery Hunter    Onset Date/Surgical Date 11/18/19    Prior Therapy none      Precautions   Precautions Other (comment)    Precaution Comments rectal cancer with radiation and chemotherapy      Restrictions   Weight Bearing Restrictions No      Prior Function   Level of Independence Independent    Vocation On disability    Leisure walking dogs      Cognition   Overall Cognitive Status Within Functional Limits for tasks assessed      Observation/Other Assessments   Skin Integrity surgical site is healing      Posture/Postural Control   Posture/Postural Control Postural limitations    Posture Comments sits on one side of buttocks      AROM   Lumbar - Right Side Bend decreased by 25%  PROM   Right Hip External Rotation  45    Left Hip External Rotation  30      Strength   Overall Strength Comments bulges abdomen when contracting    Right Hip Internal Rotation 4/5    Right Hip ABduction 3/5    Right Hip ADduction 3/5    Left Hip Extension 3/5    Left Hip Internal Rotation 4/5    Left Hip ABduction 3/5    Left Hip ADduction 3/5      Palpation   Palpation comment tenderness located in the levator ani and lower abdomen; decreased movement of the surgical scar on the abdomen                      Pelvic Floor Special Questions - 07/20/20 0001    Urinary Leakage No    Urinary urgency Yes   fecal urgency   Exam Type Deferred   due to constant urge to have a BM            [facility] Adult PT Treatment/Exercise - 07/20/20 0001      Neuro Re-ed    Neuro Re-ed Details  diaphragmatic breathing to relax the pelvic floor for a bowel movement to bulge the pelvic floor in sittng and supine with knees to chest      Lumbar Exercises: Stretches    Double Knee to Chest Stretch 1 rep;60 seconds    Double Knee to Chest Stretch Limitations while breathing to calm the nervous system and pelvic floor muscles      Manual Therapy   Manual Therapy Soft tissue mobilization    Soft tissue mobilization I love you massage to promote peristalic motion of the intestines; then did the circular massage of the abdomen                  PT Education - 07/20/20 1150    Education Details Access Code: [email or web address]; toileting technique; abdominal massage    Person(s) Educated Patient    Methods Explanation;Demonstration;Verbal cues;Handout    Comprehension Returned demonstration;Verbalized understanding            PT Short Term Goals - 05/31/20 1119      PT SHORT TERM GOAL #1   Title "Independent with initial HEP    Time 4    Period Weeks    Status On-going      PT SHORT TERM GOAL #2   Title understand how to perform scar massage to improve mobility    Time 4    Period Weeks    Status Achieved      PT SHORT TERM GOAL #3   Title able to sit on both buttocks due to muscle spasms decresaed >/= 25%    Time 4    Period Weeks    Status On-going             PT Long Term Goals - 07/20/20 1155      PT LONG TERM GOAL #1   Title Independent with advanced  HEP    Baseline not educated yet    Time 12    Period Weeks    Status New      PT LONG TERM GOAL #2   Title able to have an erection and not have to hold in midline due to increased in pelvic floor strength    Time 12    Period Weeks    Status Achieved      PT LONG TERM  GOAL #3   Title able to sit for 1 hour on each buttocks with pain decreased to minimal    Baseline pain level 5/10, not able to sit on midline, feels like the area is swollen    Time 12    Period Weeks    Status On-going      PT LONG TERM GOAL #4   Title able to engage his abdominals correctly to strengthen the pelvic floor and push the stool out of rectum    Baseline ileostomy takedown on 06/22/20 and  surgical site still healing    Time 12    Period Weeks    Status On-going      PT LONG TERM GOAL #5   Title able to push the stool out completely due to improve pelvic floor coordination    Baseline still has stool left in the rectum due to decreased coordination    Time 12    Period Weeks    Status New    Target Date 10/12/20                 Plan - 07/20/20 1128    Clinical Impression Statement Patient had a loop ileostomy takedown on 06/22/20. His surgical site is healing. He is having trouble with bowel movements. They are frequent and frequently has the urge to have one. Patient reports constant muscle spasms at level 5/10 in the perineum. He is having difficulty pushing the stool our and will cough to assist him. Patient reports he is not able to pass gas at this time. Patient has difficulty leaving his home due to the frequent bowel movements, perineum pain, and frequent urges. Patient will leak stool when he does not want to. Patient was taught today on relaxation breathing to calm the muscle spasm, how to toilet correctly and abdominal massage. When he left, her reports the muscle spasms have reduced. Patient will benefit from skilled therapy to reduce his pelvic pain, improve muscle strength and coordination to have a bowel movement.    Personal Factors and Comorbidities Comorbidity 2;Time since onset of injury/illness/exacerbation;Sex    Comorbidities rectal cancer 02/25/2019 with radiation and chemotherapy; LAR 11/18/2019    Examination-Activity Limitations Sit    Examination-Participation Restrictions Interpersonal Relationship    Stability/Clinical Decision Making Evolving/Moderate complexity    Rehab Potential Excellent    PT Frequency 1x / week    PT Duration 12 weeks    PT Treatment/Interventions ADLs/Self Care Home Management;Biofeedback;Electrical Stimulation;Therapeutic activities;Therapeutic exercise;Neuromuscular re-education;Manual techniques;Patient/family  education;Scar mobilization;Dry needling;Joint Manipulations;Spinal Manipulations    PT Next Visit Plan hip stretches, engaging the lower abdominals, soft tissue work to the perineum outside, work on bulging the perineum    PT Home Exercise Plan Access Code: [email or web address]    Recommended Other Services MD signed the initial summary    Consulted and Agree with Plan of Care Patient           Patient will benefit from skilled therapeutic intervention in order to improve the following deficits and impairments:  Decreased coordination, Increased fascial restricitons, Pain, Decreased endurance, Decreased strength, Decreased mobility, Decreased activity tolerance, Decreased scar mobility, Impaired flexibility, Increased muscle spasms  Visit Diagnosis: Muscle weakness (generalized) - Plan: PT plan of care cert/re-cert  Other muscle spasm - Plan: PT plan of care cert/re-cert  Other lack of coordination - Plan: PT plan of care cert/re-cert  Rectal cancer [facility]) - Plan: PT plan of care cert/re-cert     Problem List Patient Active Problem List  Diagnosis Date Noted  . Port-A-Cath in place 04/12/2020  . Rectal cancer ([place]) 03/02/2019  . Reactive airway disease 03/02/2019  . Vegetarian 03/02/2019  . Adjustment disorder with mixed anxiety and depressed mood 03/02/2019  . 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 Change in bowel habits 01/31/2019  . Constipation 01/31/2019  . Grade III hemorrhoids 01/31/2019  . Rectal pain 01/31/2019  . Fibromyalgia 12/18/2017  . Dysesthesia affecting both sides of body 12/18/2017  . Spondylosis without myelopathy or radiculopathy, lumbar region 12/18/2017  . Chronic post-traumatic stress disorder (PTSD) 05/25/2017  . Other fatigue 07/27/2016  . Chronic left shoulder pain 07/22/2016  . Chronic neck pain 11/19/2015  . Chronic low back pain 09/18/2014    Earlie Counts, PT 07/20/20 12:11 PM   [facility] [address] [place], [place], [address] Phone: [phone]   Fax:  [phone]  Name: Bradley Colon MRN: [id number] Date of Birth: 1971/10/07

## 2020-07-25 ENCOUNTER — Encounter: Payer: Self-pay | Admitting: Physical Therapy

## 2020-07-25 ENCOUNTER — Ambulatory Visit: Payer: Medicaid Other | Admitting: Physical Therapy

## 2020-07-25 ENCOUNTER — Other Ambulatory Visit: Payer: Self-pay

## 2020-07-25 DIAGNOSIS — M62838 Other muscle spasm: Secondary | ICD-10-CM

## 2020-07-25 DIAGNOSIS — R278 Other lack of coordination: Secondary | ICD-10-CM

## 2020-07-25 DIAGNOSIS — M6281 Muscle weakness (generalized): Secondary | ICD-10-CM | POA: Diagnosis not present

## 2020-07-25 DIAGNOSIS — C20 Malignant neoplasm of rectum: Secondary | ICD-10-CM

## 2020-07-25 NOTE — Patient Instructions (Signed)
Access Code: [email or web address] URL: [email or web address] Date: 07/25/2020 Prepared by: Earlie Counts  Exercises Supine Double Knee to Chest - 3-4 x daily - 7 x weekly - 1 sets - 1 reps - 1-2 min hold Hooklying Single Knee to Chest Stretch - 1 x daily - 7 x weekly - 1 sets - 2 reps - 30 sec hold Hooklying Hamstring Stretch with Strap - 1 x daily - 7 x weekly - 1 sets - 2 reps - 30 sec hold Hip Adductors and Hamstring Stretch with Strap - 1 x daily - 7 x weekly - 1 sets - 2 reps - 30 sec hold Supine ITB Stretch with Strap - 1 x daily - 7 x weekly - 1 sets - 2 reps - 30 sec hold Modified Thomas Stretch - 1 x daily - 7 x weekly - 1 sets - 2 reps - 30 sec hold [facility] [address] [place], [place] [address] Phone # [phone] Fax [phone]

## 2020-07-25 NOTE — Therapy (Signed)
[facility] [address] [place], [place], [address] Phone: [phone]   Fax:  [phone]  Physical Therapy Treatment  Patient Details  Name: Bradley Colon MRN: [id number] Date of Birth: [birth year] Referring Provider (PT): Dr. Hillery Hunter   Encounter Date: 07/25/2020   PT End of Session - 07/25/20 1128    Visit Number 4    Date for PT Re-Evaluation 10/12/20    Authorization Type [employer]    Authorization - Visit Number 4    Authorization - Number of Visits 27    PT Start Time 7322   came late   PT Stop Time 1140    PT Time Calculation (min) 28 min    Activity Tolerance Patient tolerated treatment well    Behavior During Therapy [facility] for tasks assessed/performed           Past Medical History:  Diagnosis Date   Aggressiveness 12/28/2018   Asthma    Cancer ([place])    Cervical radiculopathy    Chronic back pain    Kidney stones    Reactive airway disease     Past Surgical History:  Procedure Laterality Date   EPIDURAL BLOCK INJECTION     laproscopic anterior resection for rectal cancer   11/18/2019   SHOULDER SURGERY      There were no vitals filed for this visit.   Subjective Assessment - 07/25/20 1113    Subjective I feel alot better since last visit. I am getting the rythym. I am doing the relaxation while on the side. I have worn 1 pair of underwear all day yesterday for the first time. I will poop with a stool now. I was able to pass gas yesterday and felt the muscles close up afterwards.    Patient Stated Goals reduce pelvic pain, able to have a bowel movement    Currently in Pain? Yes    Pain Score 5     Pain Location Perineum    Pain Orientation Mid    Pain Descriptors / Indicators Constant;Cramping    Pain Type Acute pain    Pain Onset More than a month ago    Pain Frequency Constant    Aggravating Factors  sitting on both tuberosities    Pain Relieving Factors  breathing technique                             [facility] Adult PT Treatment/Exercise - 07/25/20 0001      Lumbar Exercises: Stretches   Active Hamstring Stretch Right;Left;1 rep;30 seconds    Active Hamstring Stretch Limitations supine with strap    Double Knee to Chest Stretch 1 rep;60 seconds    Double Knee to Chest Stretch Limitations while breathing to calm the nervous system and pelvic floor muscles    Hip Flexor Stretch Right;Left;1 rep;30 seconds    Hip Flexor Stretch Limitations supine with leg off mat    Other Lumbar Stretch Exercise hip adduction with strap in supine 30 sec each side; lateral hip stretch with strap holding for 30 sec each leg      Manual Therapy   Manual Therapy Soft tissue mobilization    Soft tissue mobilization soft tissue around the surgical site that is healing to release the tightness of the tissue                  PT Education - 07/25/20 1146  Education Details Access Code: [email or web address]    Person(s) Educated Patient    Methods Explanation;Demonstration;Verbal cues;Handout    Comprehension Returned demonstration;Verbalized understanding            PT Short Term Goals - 05/31/20 1119      PT SHORT TERM GOAL #1   Title "Independent with initial HEP    Time 4    Period Weeks    Status On-going      PT SHORT TERM GOAL #2   Title understand how to perform scar massage to improve mobility    Time 4    Period Weeks    Status Achieved      PT SHORT TERM GOAL #3   Title able to sit on both buttocks due to muscle spasms decresaed >/= 25%    Time 4    Period Weeks    Status On-going             PT Long Term Goals - 07/20/20 1155      PT LONG TERM GOAL #1   Title Independent with advanced  HEP    Baseline not educated yet    Time 12    Period Weeks    Status New      PT LONG TERM GOAL #2   Title able to have an erection and not have to hold in midline due to increased in pelvic floor strength    Time 12     Period Weeks    Status Achieved      PT LONG TERM GOAL #3   Title able to sit for 1 hour on each buttocks with pain decreased to minimal    Baseline pain level 5/10, not able to sit on midline, feels like the area is swollen    Time 12    Period Weeks    Status On-going      PT LONG TERM GOAL #4   Title able to engage his abdominals correctly to strengthen the pelvic floor and push the stool out of rectum    Baseline ileostomy takedown on 06/22/20 and surgical site still healing    Time 12    Period Weeks    Status On-going      PT LONG TERM GOAL #5   Title able to push the stool out completely due to improve pelvic floor coordination    Baseline still has stool left in the rectum due to decreased coordination    Time 12    Period Weeks    Status New    Target Date 10/12/20                 Plan - 07/25/20 1129    Clinical Impression Statement Patient wore the same underwear all day yesterday for the first time. Patient was able to pass gas yesterday for the first time. Patient is doing his meditation at home in [place] that is helping him to manage pain. He is doing his abdominal massage to help with bowel movment. Patient will benefit from skilled therapy to reduce his pelvic pain, improve muscle strength and coordination to have a bowel movement.    Personal Factors and Comorbidities Comorbidity 2;Time since onset of injury/illness/exacerbation;Sex    Comorbidities rectal cancer 02/25/2019 with radiation and chemotherapy; LAR 11/18/2019    Examination-Activity Limitations Sit    Examination-Participation Restrictions Interpersonal Relationship    Stability/Clinical Decision Making Evolving/Moderate complexity    Rehab Potential Excellent    PT Frequency 1x / week  PT Duration 12 weeks    PT Treatment/Interventions ADLs/Self Care Home Management;Biofeedback;Electrical Stimulation;Therapeutic activities;Therapeutic exercise;Neuromuscular re-education;Manual  techniques;Patient/family education;Scar mobilization;Dry needling;Joint Manipulations;Spinal Manipulations    PT Next Visit Plan hip stretches, engaging the lower abdominals, soft tissue work to the perineum outside, work on bulging the perineum    PT Home Exercise Plan Access Code: [email or web address]    Consulted and Agree with Plan of Care Patient           Patient will benefit from skilled therapeutic intervention in order to improve the following deficits and impairments:  Decreased coordination, Increased fascial restricitons, Pain, Decreased endurance, Decreased strength, Decreased mobility, Decreased activity tolerance, Decreased scar mobility, Impaired flexibility, Increased muscle spasms  Visit Diagnosis: Muscle weakness (generalized)  Other muscle spasm  Other lack of coordination  Rectal cancer [facility])     Problem List Patient Active Problem List   Diagnosis Date Noted   Port-A-Cath in place 04/12/2020   Rectal cancer ([place]) 03/02/2019   Reactive airway disease 03/02/2019   Vegetarian 03/02/2019   Adjustment disorder with mixed anxiety and depressed mood 03/02/2019   Chronic pain after traumatic injury 03/02/2019   Change in bowel habits 01/31/2019   Constipation 01/31/2019   Grade III hemorrhoids 01/31/2019   Rectal pain 01/31/2019   Fibromyalgia 12/18/2017   Dysesthesia affecting both sides of body 12/18/2017   Spondylosis without myelopathy or radiculopathy, lumbar region 12/18/2017   Chronic post-traumatic stress disorder (PTSD) 05/25/2017   Other fatigue 07/27/2016   Chronic left shoulder pain 07/22/2016   Chronic neck pain 11/19/2015   Chronic low back pain 09/18/2014    Earlie Counts, PT 07/25/20 12:43 PM    [facility] [address] [place], [place], [address] Phone: [phone]   Fax:  [phone]  Name: Bradley Colon MRN: [id number] Date of Birth: 11-15-1970

## 2020-07-31 ENCOUNTER — Inpatient Hospital Stay: Payer: Medicaid Other

## 2020-07-31 ENCOUNTER — Inpatient Hospital Stay: Payer: Medicaid Other | Admitting: Oncology

## 2020-08-02 ENCOUNTER — Telehealth: Payer: Self-pay | Admitting: Oncology

## 2020-08-02 ENCOUNTER — Encounter: Payer: Self-pay | Admitting: Oncology

## 2020-08-02 NOTE — Telephone Encounter (Signed)
Scheduled appt per 9/30 sch msg - unable to reach pt . Left message for patient with appt date and time

## 2020-08-08 ENCOUNTER — Ambulatory Visit: Payer: Medicaid Other | Admitting: Physical Therapy

## 2020-08-16 ENCOUNTER — Other Ambulatory Visit: Payer: Self-pay

## 2020-08-16 ENCOUNTER — Inpatient Hospital Stay: Payer: Medicaid Other | Attending: Oncology | Admitting: Oncology

## 2020-08-16 ENCOUNTER — Inpatient Hospital Stay: Payer: Medicaid Other

## 2020-08-16 ENCOUNTER — Encounter: Payer: Self-pay | Admitting: *Deleted

## 2020-08-16 DIAGNOSIS — C20 Malignant neoplasm of rectum: Secondary | ICD-10-CM | POA: Insufficient documentation

## 2020-08-16 DIAGNOSIS — Z452 Encounter for adjustment and management of vascular access device: Secondary | ICD-10-CM | POA: Insufficient documentation

## 2020-08-16 NOTE — Progress Notes (Signed)
Patient is "no show" for 3rd time for Dr. Benay Spice. Will not reschedule until patient calls to ensure he will come in. He is being followed at [facility] as well.

## 2020-08-17 ENCOUNTER — Other Ambulatory Visit: Payer: Self-pay

## 2020-08-17 ENCOUNTER — Inpatient Hospital Stay: Payer: Medicaid Other

## 2020-08-17 DIAGNOSIS — Z95828 Presence of other vascular implants and grafts: Secondary | ICD-10-CM

## 2020-08-17 DIAGNOSIS — Z452 Encounter for adjustment and management of vascular access device: Secondary | ICD-10-CM | POA: Diagnosis not present

## 2020-08-17 DIAGNOSIS — C20 Malignant neoplasm of rectum: Secondary | ICD-10-CM

## 2020-08-17 MED ORDER — SODIUM CHLORIDE 0.9% FLUSH
10.0000 mL | Freq: Once | INTRAVENOUS | Status: AC
  Administered 2020-08-17: 10 mL
  Filled 2020-08-17: qty 10

## 2020-08-17 MED ORDER — HEPARIN SOD (PORK) LOCK FLUSH 100 UNIT/ML IV SOLN
500.0000 [IU] | Freq: Once | INTRAVENOUS | Status: AC
  Administered 2020-08-17: 500 [IU]
  Filled 2020-08-17: qty 5

## 2020-08-22 ENCOUNTER — Other Ambulatory Visit: Payer: Self-pay | Admitting: Oncology

## 2020-08-22 ENCOUNTER — Other Ambulatory Visit: Payer: Self-pay | Admitting: *Deleted

## 2020-08-22 MED ORDER — APIXABAN 5 MG PO TABS
5.0000 mg | ORAL_TABLET | Freq: Two times a day (BID) | ORAL | 3 refills | Status: DC
Start: 2020-08-22 — End: 2020-11-13

## 2020-08-22 MED FILL — ELIQUIS 5 MG TABLET: 5 | 30 days supply | Qty: 60 | Fill #0

## 2020-08-22 NOTE — Telephone Encounter (Signed)
Per Dr. Benay Spice: Move 11/22 visit to week of 11/01--scheduling message sent and refill the apixaban.

## 2020-08-23 ENCOUNTER — Other Ambulatory Visit: Payer: Self-pay

## 2020-08-23 ENCOUNTER — Ambulatory Visit: Payer: Medicaid Other | Attending: Urology | Admitting: Physical Therapy

## 2020-08-23 ENCOUNTER — Encounter: Payer: Self-pay | Admitting: Physical Therapy

## 2020-08-23 DIAGNOSIS — C20 Malignant neoplasm of rectum: Secondary | ICD-10-CM | POA: Insufficient documentation

## 2020-08-23 DIAGNOSIS — M6281 Muscle weakness (generalized): Secondary | ICD-10-CM | POA: Diagnosis present

## 2020-08-23 DIAGNOSIS — R278 Other lack of coordination: Secondary | ICD-10-CM | POA: Insufficient documentation

## 2020-08-23 DIAGNOSIS — M62838 Other muscle spasm: Secondary | ICD-10-CM | POA: Diagnosis present

## 2020-08-23 NOTE — Therapy (Addendum)
[facility] [address] [place], [place], [address] Phone: [phone]   Fax:  [phone]  Physical Therapy Treatment  Patient Details  Name: Bradley Colon MRN: [id number] Date of Birth: [birth year] Referring Provider (PT): Dr. Hillery Hunter   Encounter Date: 08/23/2020   PT End of Session - 08/23/20 1508    Visit Number 5    Date for PT Re-Evaluation 10/12/20    Authorization Type [employer]    Authorization - Visit Number 5    Authorization - Number of Visits 27    PT Start Time 1500   came late 15 min   PT Stop Time 1540    PT Time Calculation (min) 40 min    Activity Tolerance Patient tolerated treatment well    Behavior During Therapy [facility] for tasks assessed/performed           Past Medical History:  Diagnosis Date  . Aggressiveness 12/28/2018  . Asthma   . Cancer ([place])   . Cervical radiculopathy   . Chronic back pain   . Kidney stones   . Reactive airway disease     Past Surgical History:  Procedure Laterality Date  . EPIDURAL BLOCK INJECTION    . laproscopic anterior resection for rectal cancer   11/18/2019  . SHOULDER SURGERY      There were no vitals filed for this visit.   Subjective Assessment - 08/23/20 1503    Subjective I had 3 accidents this week. My anus is not shutting. When the stool starts to come out I do not have the ability to push the stool to come out or squeeze my anus.    Patient Stated Goals reduce pelvic pain, able to have a bowel movement    Currently in Pain? Yes    Pain Score 10-Worst pain ever    Pain Location Rectum    Pain Orientation Mid    Pain Descriptors / Indicators Spasm;Cramping    Pain Type Acute pain    Pain Onset More than a month ago    Pain Frequency Intermittent    Aggravating Factors  when going to the bathroom    Pain Relieving Factors after a stool    Multiple Pain Sites No              [facility] PT Assessment - 08/23/20 0001       Assessment   Medical Diagnosis M62.89 Pelvic floor dysfunction    Referring Provider (PT) Dr. Hillery Hunter    Onset Date/Surgical Date 11/18/19    Prior Therapy none      Precautions   Precautions Other (comment)    Precaution Comments rectal cancer with radiation and chemotherapy      Restrictions   Weight Bearing Restrictions No      Prior Function   Level of Independence Independent    Vocation On disability    Leisure walking dogs      Cognition   Overall Cognitive Status Within Functional Limits for tasks assessed      Observation/Other Assessments   Skin Integrity surgical site has a scab over it      Posture/Postural Control   Posture/Postural Control Postural limitations    Posture Comments able to sit on equal buttocks      ROM / Strength   AROM / PROM / Strength AROM;PROM;Strength      AROM   Lumbar - Right Side Bend decreased by 25%  PROM   Right Hip External Rotation  45    Left Hip External Rotation  30      Strength   Right Hip Internal Rotation 4/5    Right Hip ABduction 3/5    Right Hip ADduction 3/5    Left Hip Extension 3/5    Left Hip Internal Rotation 4/5    Left Hip ABduction 3/5    Left Hip ADduction 3/5      Palpation   Palpation comment tenderness located in the levator ani and lower abdomen; decreased movement of the surgical scar on the abdomen                      Pelvic Floor Special Questions - 08/23/20 0001    Fecal incontinence Yes             [facility] Adult PT Treatment/Exercise - 08/23/20 0001      Self-Care   Self-Care Other Self-Care Comments    Other Self-Care Comments  discussed with patient on what insoluble fiber is and how it can improve bulk of the stool to reduce the stickiness; educated patient on bowel health and eating 3 meals per day and how to food goes through the digestive system ;       Neuro Re-ed    Neuro Re-ed Details  diaphragmatic breathing to expand the lower rib cage  into the abdomen then bulging the pelvic floor., Patient needed verbal cues and tactile cues to perform correctly.       Exercises   Exercises Other Exercises    Other Exercises  verbally went over patient stretchs and discussed how it will improve the mobility and discomfort in his hips                  PT Education - 08/23/20 1556    Education Details bulging of the pelvic floor, introduction to fiber, explaination on how the food travels through the digestive system    Person(s) Educated Patient    Methods Explanation;Handout    Comprehension Verbalized understanding;Returned demonstration            PT Short Term Goals - 05/31/20 1119      PT SHORT TERM GOAL #1   Title "Independent with initial HEP    Time 4    Period Weeks    Status On-going      PT SHORT TERM GOAL #2   Title understand how to perform scar massage to improve mobility    Time 4    Period Weeks    Status Achieved      PT SHORT TERM GOAL #3   Title able to sit on both buttocks due to muscle spasms decresaed >/= 25%    Time 4    Period Weeks    Status On-going             PT Long Term Goals - 07/20/20 1155      PT LONG TERM GOAL #1   Title Independent with advanced  HEP    Baseline not educated yet    Time 12    Period Weeks    Status New      PT LONG TERM GOAL #2   Title able to have an erection and not have to hold in midline due to increased in pelvic floor strength    Time 12    Period Weeks    Status Achieved      PT LONG TERM GOAL #  3   Title able to sit for 1 hour on each buttocks with pain decreased to minimal    Baseline pain level 5/10, not able to sit on midline, feels like the area is swollen    Time 12    Period Weeks    Status On-going      PT LONG TERM GOAL #4   Title able to engage his abdominals correctly to strengthen the pelvic floor and push the stool out of rectum    Baseline ileostomy takedown on 06/22/20 and surgical site still healing    Time 12     Period Weeks    Status On-going      PT LONG TERM GOAL #5   Title able to push the stool out completely due to improve pelvic floor coordination    Baseline still has stool left in the rectum due to decreased coordination    Time 12    Period Weeks    Status New    Target Date 10/12/20                 Plan - 08/23/20 1509    Clinical Impression Statement Patient reports he is having fecal leakage and has trouble with contracting his anus to close it to prevent fecal leakage. Patient reports he has increased pain in the rectum after ejaculation and after a bowel movement. He sometimes will sit on the commode for 2 hours  Patient has to wipe many times to clean the rectum due to decreased tone of the anus. Patient has decreased mobility of the scar on the abdomen. Patient has learned how to bulge his pelvic floor to elongate the muscle when he has a cramp or muscle spasm in the anal region. Patient still needs to learn how to contract the anal region with pelvic floor EMG. Patient will benefit from skilled therapy to reduce pelvic pain, improve pelvic floor coordination to reduce leakage, improve overall strength and endurance.    Personal Factors and Comorbidities Comorbidity 2;Time since onset of injury/illness/exacerbation;Sex    Comorbidities rectal cancer 02/25/2019 with radiation and chemotherapy; LAR 11/18/2019; ilieostomy reversal 06/22/2020    Examination-Activity Limitations Sit    Examination-Participation Restrictions Interpersonal Relationship    Stability/Clinical Decision Making Evolving/Moderate complexity    Rehab Potential Excellent    PT Frequency 1x / week    PT Duration 12 weeks    PT Treatment/Interventions ADLs/Self Care Home Management;Biofeedback;Electrical Stimulation;Therapeutic activities;Therapeutic exercise;Neuromuscular re-education;Manual techniques;Patient/family education;Scar mobilization;Dry needling;Joint Manipulations;Spinal Manipulations    PT Next  Visit Plan pelvic floor EMG; soft tissue work on scar, abdominal engagement    PT Home Exercise Plan Access Code: [email or web address]    Consulted and Agree with Plan of Care Patient           Patient will benefit from skilled therapeutic intervention in order to improve the following deficits and impairments:  Decreased coordination, Increased fascial restricitons, Pain, Decreased endurance, Decreased strength, Decreased mobility, Decreased activity tolerance, Decreased scar mobility, Impaired flexibility, Increased muscle spasms  Visit Diagnosis: Muscle weakness (generalized)  Other muscle spasm  Other lack of coordination  Rectal cancer [facility])     Problem List Patient Active Problem List   Diagnosis Date Noted  . Port-A-Cath in place 04/12/2020  . Rectal cancer ([place]) 03/02/2019  . Reactive airway disease 03/02/2019  . Vegetarian 03/02/2019  . Adjustment disorder with mixed anxiety and depressed mood 03/02/2019  . 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 Change in bowel habits 01/31/2019  .  Constipation 01/31/2019  . Grade III hemorrhoids 01/31/2019  . Rectal pain 01/31/2019  . Fibromyalgia 12/18/2017  . Dysesthesia affecting both sides of body 12/18/2017  . Spondylosis without myelopathy or radiculopathy, lumbar region 12/18/2017  . Chronic post-traumatic stress disorder (PTSD) 05/25/2017  . Other fatigue 07/27/2016  . Chronic left shoulder pain 07/22/2016  . Chronic neck pain 11/19/2015  . Chronic low back pain 09/18/2014    Earlie Counts, PT 08/23/20 4:06 PM   [facility] [address], [place], [place], [address] Phone: [phone]   Fax:  [phone]  Name: Bradley Colon MRN: [id number] Date of Birth: 12-Feb-1971  PHYSICAL THERAPY DISCHARGE SUMMARY  Visits from Start of Care: 5  Current functional level related to goals / functional outcomes: See above. Patient no show for today's appointment and  has been late for many appointments. He is being discharged due to poor compliance with attendance.    Remaining deficits: Unable to assess patient due to him not returning.    Education / Equipment: HEP Plan:                                                    Patient goals were not met. Patient is being discharged due to not returning since the last visit. Thank you for the referral. Earlie Counts, PT 09/05/20 5:21 PM  ?????

## 2020-08-23 NOTE — Patient Instructions (Addendum)
Types of Fiber  There are two main types of fiber:  insoluble and soluble.  Both of these types can prevent and relieve constipation and diarrhea, although some people find one or the other to be more easily digested.  This handout details information about both types of fiber.  Insoluble Fiber       Functions of Insoluble Fiber . moves bulk through the intestines  . controls and balances the pH (acidity) in the intestines       Benefits of Insoluble Fiber . promotes regular bowel movement and prevents constipation  . removes fecal waste through colon in less time  . keeps an optimal pH in intestines to prevent microbes from producing cancer substances, therefore preventing colon cancer        Food Sources of Insoluble Fiber . corn bran  . flax seed or other seeds . vegetables such as green beans, broccoli, cauliflower and potato skins  . fruit skins and root vegetable skins  . popcorn . brown rice

## 2020-08-24 ENCOUNTER — Telehealth: Payer: Self-pay | Admitting: Oncology

## 2020-08-24 NOTE — Telephone Encounter (Signed)
Rescheduled appointment per 10/21 sch msg. Called patient, no answer. Left message for patient with appointment date and time.

## 2020-08-29 ENCOUNTER — Encounter: Payer: Self-pay | Admitting: Oncology

## 2020-08-30 ENCOUNTER — Other Ambulatory Visit: Payer: Self-pay | Admitting: *Deleted

## 2020-08-30 MED ORDER — ALPRAZOLAM 0.25 MG PO TABS: 0.2500 mg | ORAL_TABLET | Freq: Every day | ORAL | 0 refills | Status: DC | PRN

## 2020-08-30 MED FILL — ALPRAZolam 0.25 MG TABS: 0.25 | 30 days supply | Qty: 30 | Fill #0

## 2020-08-30 NOTE — Progress Notes (Signed)
Mychart request for refill on alprazolam and oxycodone. MD approved alprazolam, but he needs to obtain his pain medication from pain clinic at [facility].

## 2020-09-03 ENCOUNTER — Telehealth: Payer: Self-pay | Admitting: *Deleted

## 2020-09-03 ENCOUNTER — Inpatient Hospital Stay: Payer: Medicaid Other | Admitting: Nurse Practitioner

## 2020-09-03 NOTE — Telephone Encounter (Signed)
Called patient to cancel his appointment today--put w/NP in error. Also per Dr. Benay Spice, needs to have the PET scan before being seen. Patient insistent that he has had multiple PET scans (only  one performed at [facility] in 2020). Wants chemo and then a scan. Will have MD to call him to go over his many concerns.

## 2020-09-04 ENCOUNTER — Other Ambulatory Visit: Payer: Self-pay | Admitting: Oncology

## 2020-09-04 DIAGNOSIS — C20 Malignant neoplasm of rectum: Secondary | ICD-10-CM

## 2020-09-05 ENCOUNTER — Ambulatory Visit: Payer: Medicaid Other | Admitting: Physical Therapy

## 2020-09-05 ENCOUNTER — Telehealth: Payer: Self-pay | Admitting: *Deleted

## 2020-09-05 ENCOUNTER — Ambulatory Visit: Payer: Medicaid Other | Attending: Urology | Admitting: Physical Therapy

## 2020-09-05 ENCOUNTER — Telehealth: Payer: Self-pay | Admitting: Oncology

## 2020-09-05 NOTE — Telephone Encounter (Signed)
Left VM with CT appointment at [facility] 09/11/20 at 12:45/1300 and MD visit on 11/10 at 11:30. Also sent MyChart message with request to call or confirm receipt by Mychart. Scheduler will be calling for the lab/flush on 11/9.

## 2020-09-05 NOTE — Telephone Encounter (Signed)
Scheduled appt per 11/3 sch msg - left message for patient with appt date and time

## 2020-09-10 ENCOUNTER — Encounter: Payer: Medicaid Other | Admitting: Physical Therapy

## 2020-09-10 MED FILL — ELIQUIS 5 MG TABLET: 5 | 30 days supply | Qty: 60 | Fill #0

## 2020-09-11 ENCOUNTER — Inpatient Hospital Stay: Payer: Medicaid Other

## 2020-09-11 ENCOUNTER — Inpatient Hospital Stay: Payer: Medicaid Other | Attending: Oncology

## 2020-09-11 ENCOUNTER — Other Ambulatory Visit: Payer: Self-pay

## 2020-09-11 ENCOUNTER — Ambulatory Visit (HOSPITAL_COMMUNITY)
Admission: RE | Admit: 2020-09-11 | Discharge: 2020-09-11 | Disposition: A | Discharge status: Home / Self Care | Payer: Medicaid Other | Source: Ambulatory Visit | Attending: Oncology | Admitting: Oncology

## 2020-09-11 ENCOUNTER — Telehealth: Payer: Self-pay | Admitting: *Deleted

## 2020-09-11 DIAGNOSIS — C20 Malignant neoplasm of rectum: Secondary | ICD-10-CM | POA: Insufficient documentation

## 2020-09-11 DIAGNOSIS — Z95828 Presence of other vascular implants and grafts: Secondary | ICD-10-CM

## 2020-09-11 DIAGNOSIS — Z452 Encounter for adjustment and management of vascular access device: Secondary | ICD-10-CM | POA: Insufficient documentation

## 2020-09-11 LAB — CMP (CANCER CENTER ONLY)
ALT: 17 U/L (ref 0–44)
AST: 15 U/L (ref 15–41)
Albumin: 3.6 g/dL (ref 3.5–5.0)
Alkaline Phosphatase: 81 U/L (ref 38–126)
Anion gap: 5 (ref 5–15)
BUN: 11 mg/dL (ref 6–20)
CO2: 28 mmol/L (ref 22–32)
Calcium: 9.6 mg/dL (ref 8.9–10.3)
Chloride: 110 mmol/L (ref 98–111)
Creatinine: 1.06 mg/dL (ref 0.61–1.24)
GFR, Estimated: >60 mL/min (ref >60)
Glucose, Bld: 93 mg/dL (ref 70–99)
Potassium: 4.8 mmol/L (ref 3.5–5.1)
Sodium: 143 mmol/L (ref 135–145)
Total Bilirubin: 0.7 mg/dL (ref 0.3–1.2)
Total Protein: 6.8 g/dL (ref 6.5–8.1)

## 2020-09-11 LAB — CBC WITH DIFFERENTIAL (CANCER CENTER ONLY)
Abs Immature Granulocytes: 0.01 10*3/uL (ref 0.00–0.07)
Basophils Absolute: 0 10*3/uL (ref 0.0–0.1)
Basophils Relative: 1 %
Eosinophils Absolute: 0.3 10*3/uL (ref 0.0–0.5)
Eosinophils Relative: 7 %
HCT: 40.5 % (ref 39.0–52.0)
Hemoglobin: 13 g/dL (ref 13.0–17.0)
Immature Granulocytes: 0 %
Lymphocytes Relative: 33 %
Lymphs Abs: 1.5 10*3/uL (ref 0.7–4.0)
MCH: 28.3 pg (ref 26.0–34.0)
MCHC: 32.1 g/dL (ref 30.0–36.0)
MCV: 88 fL (ref 80.0–100.0)
Monocytes Absolute: 0.6 10*3/uL (ref 0.1–1.0)
Monocytes Relative: 14 %
Neutro Abs: 2 10*3/uL (ref 1.7–7.7)
Neutrophils Relative %: 45 %
Platelet Count: 340 10*3/uL (ref 150–400)
RBC: 4.6 MIL/uL (ref 4.22–5.81)
RDW: 13.8 % (ref 11.5–15.5)
WBC Count: 4.4 10*3/uL (ref 4.0–10.5)
nRBC: 0 % (ref 0.0–0.2)

## 2020-09-11 LAB — CEA (IN HOUSE-CHCC): CEA (CHCC-In House): 12.38 ng/mL — ABNORMAL HIGH (ref 0.00–5.00)

## 2020-09-11 MED ORDER — SODIUM CHLORIDE 0.9% FLUSH
10.0000 mL | Freq: Once | INTRAVENOUS | Status: AC
  Administered 2020-09-11: 10 mL
  Filled 2020-09-11: qty 10

## 2020-09-11 NOTE — Telephone Encounter (Signed)
Requested to move 11/10 appointment due to going to pain management appointment that day. Rescheduled for 09/13/20 at 0830.

## 2020-09-12 ENCOUNTER — Inpatient Hospital Stay: Payer: Medicaid Other | Admitting: Oncology

## 2020-09-12 DIAGNOSIS — R102 Pelvic and perineal pain: Secondary | ICD-10-CM | POA: Insufficient documentation

## 2020-09-13 ENCOUNTER — Encounter: Payer: Self-pay | Admitting: Oncology

## 2020-09-13 ENCOUNTER — Telehealth: Payer: Self-pay | Admitting: *Deleted

## 2020-09-13 ENCOUNTER — Telehealth: Payer: Self-pay

## 2020-09-13 ENCOUNTER — Inpatient Hospital Stay (HOSPITAL_BASED_OUTPATIENT_CLINIC_OR_DEPARTMENT_OTHER): Payer: Medicaid Other | Admitting: Oncology

## 2020-09-13 DIAGNOSIS — C20 Malignant neoplasm of rectum: Secondary | ICD-10-CM

## 2020-09-13 NOTE — Telephone Encounter (Signed)
Called to ask the following re: CT report...asking should he be worried about his heart re: calcifications and did RT cause it. Also asking if the liver lesion seen is a new one or where he had ablation? Per Dr. Benay Spice: No concern about his heart and RT did not cause it. Thinks liver lesion is the ablation site. Patient notified and instructed to come to office tomorrow for height and weight check so MD can put in the chemo orders to get authorized. He agrees and also reports he agrees to have the botox perineal injection for his pain w/pain management. He is asking what to do about his pain while waiting for this to be done on 10/05/20? Instructed him to call surgeon for pain issue at this time. Dr. Benay Spice declines to manage his pain with narcotics.

## 2020-09-13 NOTE — Progress Notes (Signed)
[place] OFFICE VISIT PROGRESS NOTE  I connected with [name] 09/13/20 at  8:30 AM EST by video/telephone and verified that I am speaking with the correct person using two identifiers.   I discussed the limitations, risks, security and privacy concerns of performing an evaluation and management service by telemedicine and the availability of in-person appointments. I also discussed with the patient that there may be a patient responsible charge related to this service. The patient expressed understanding and agreed to proceed.    Patient's location: Home Provider's location: Office   Diagnosis: Rectal cancer  INTERVAL HISTORY:   Bradley Colon was seen today for a telehealth visit per his request.  He was scheduled for an office visit but reports he was "up all night and pain "and unable to attend the office visit.  The visit started as a video visit, but he was unable to see or hear me.  The visit was switched to a telephone visit.  Mr. Bradley Colon complains of pain after intercourse.  He also has pain after bowel movements.  He is being evaluated by the pain clinic at [facility].  He was evaluated yesterday.  He was offered a pudendal nerve block and peroneal Botox injections.  He is participating in pelvic physical therapy. He is scheduling a follow-up surgical appointment at [facility].   Lab Results:  Lab Results  Component Value Date   WBC 4.4 09/11/2020   HGB 13.0 09/11/2020   HCT 40.5 09/11/2020   MCV 88.0 09/11/2020   PLT 340 09/11/2020   NEUTROABS 2.0 09/11/2020    Imaging:  CT CHEST WO CONTRAST  Result Date: 09/12/2020 CLINICAL DATA:  Rectal cancer.  Pulmonary nodules.  Restaging. EXAM: CT CHEST WITHOUT CONTRAST TECHNIQUE: Multidetector CT imaging of the chest was performed following the standard protocol without IV contrast. COMPARISON:  04/11/2020 FINDINGS: Cardiovascular: The heart size is normal. No substantial pericardial  effusion. Atherosclerotic calcification is noted in the wall of the thoracic aorta. Right Port-A-Cath tip is positioned in the distal SVC. Mediastinum/Nodes: No mediastinal lymphadenopathy. No evidence for gross hilar lymphadenopathy although assessment is limited by the lack of intravenous contrast on today's study. The esophagus has normal imaging features. There is no axillary lymphadenopathy. Lungs/Pleura: Bilateral pulmonary nodules evident. 9 mm left upper lobe nodule on 34/5 was 8 mm (remeasured) previously. 9 mm cavitary nodule in the left lower lobe on 85/5 was 7 mm (remeasured) previously without cavitation. 1.4 x 1.3 cm cavitary nodule right lower lobe on 97/5 was measured previously at 1.2 x 1.0 cm. Numerous additional tiny pulmonary nodules also appear progressive. For example, 5 mm left upper lobe nodule on image 65/5 was 2 mm on the prior study. No definite new pulmonary nodule or mass. No focal airspace consolidation. No pleural effusion. Upper Abdomen: 4.2 x 4.4 cm lesion in the medial right liver (image 123/2) was 3.5 x 3.1 cm (remeasured) previously Musculoskeletal: No worrisome lytic or sclerotic osseous abnormality. IMPRESSION: 1. Interval progression of bilateral pulmonary nodules, consistent with metastatic disease. 2. Medial right liver lesion, although not described on the prior study, is progressive in the interval and also compatible with metastatic disease. Abdomen/pelvis CT with contrast could be used to further evaluate as clinically warranted. 3. Aortic Atherosclerosis (ICD10-I70.0). Electronically Signed   By: Misty Stanley M.D.   On: 09/12/2020 10:19    Medications: I have reviewed the patient's current medications.  Assessment/Plan: 1. Rectal cancer, clinical stage III (T3cN2) ? Rectal  mass extending from 4-9 cm on colonoscopy 02/15/2019 with a biopsy confirming invasive adenocarcinoma ? CTs 02/23/2019- thickening of the low rectum without a discrete mass, small bilateral  pulmonary nodules, some present on imaging from 2014 ? Pelvic MRI 02/25/2019- T3cN2 tumor beginning at 4.9 cm from the anal verge and 0 cm from the internal anal sphincter, tumor extends through the muscularis propria, 2.  Rectal lymph nodes and 2 enlarged right internal iliac nodes ? CTs at [facility] 04/19/2019- new subcentimeter right hepatic lesion concerning for metastasis, stable low rectal mass with prominent mesorectal lymph nodes, stable pathologic enlarged right internal iliac nodes, stable pulmonary nodules ? Xeloda/radiation beginning 04/26/2019, completed 07/05/2019 ? CT 07/26/2019-growth of previously described right hepatic lesion, slight decrease in pelvic lymphadenopathy and rectal thickening ? 08/05/2019-week 1 bolus 5-FU/leucovorin (200 mg/m) ? 08/12/2019-week to bolus 5-FU/leucovorin (400 mg/m) ? 10/16/ 2020-week 3 bolus 5-FU/leucovorin (400 mg/m) and oxaliplatin (42.5 mg/m) ? CT chest 10/04/2019-slight increase in bilateral pulmonary nodules concerning for metastases ? MRI abdomen 10/06/2019-right hepatic lesion consistent with metastasis, wall thickening of the low anterior rectum, no new or enlarging adenopathy, stable mesorectal and right internal iliac nodes ? PET scan 10/14/2019-pulmonary nodules below PET resolution, hypermetabolic right posterior hepatic lesion, mildly hypermetabolic partially calcified right internal iliac chain lymphadenopathy, mild gastric hypermetabolism and thickening-likely gastritis ? 11/18/2019-LAR with diverting loop ileostomy and ablation of solitary liver metastasis (2 cm segment 7), moderately differentiated adenocarcinoma invading into soft tissue, LVI present, perineural invasion present, 5/15 lymph nodes positive, 4 tumor deposits, absent treatment effect-score 3,ypT3 ypN2a  ? CT chest 01/16/2020-increase in the size of a dominant right lower lobe nodule, unchanged left upper lobe and left lower lobe nodules ? CT chest 04/11/2020-enlargement of bilateral lung  nodules, and new nodules ? CT chest 09/11/2020-enlargement of bilateral lung nodules, aggressive medial right liver lesion 2. Pain secondary to #1, improved 3. Reactive airways disease/asthma 4. Report of multiple allergens 5. Port-A-Cath related DVT, right IJ, 09/30/2019-apixaban, converted to Lovenox surrounding surgery January 2021, changed back to apixaban 12/30/2019 6. Erectile dysfunction following treatment for rectal cancer-followed by urology     Disposition: Mr. Bradley Colon has metastatic rectal cancer.  There is CT evidence of disease progression.  The CEA is elevated.  I discussed the CT findings with him.  I reviewed the CT images.  We discussed treatment options.  I explained no therapy will be curative.  The liver lesion described on the current CT most likely correlates with the lesion that was ablated on the January 2021 surgery.  I recommend beginning systemic therapy.  He was previously treated with 1 cycle of oxaliplatin based therapy.  He agrees to FOLFOX chemotherapy.  We reviewed potential toxicities associated with the FOLFOX regimen including the chance of alopecia.  He agrees to proceed.  He will be scheduled for an office visit and FOLFOX within the next 2 weeks.  I recommended he follow-up with the pain clinic and Dr. Hester Mates for evaluation of the persistent rectal pain.  We will request the rectal tissue from [facility] be submitted for Foundation 1 testing.  I discussed the assessment and treatment plan with the patient. The patient was provided an opportunity to ask questions and all were answered. The patient agreed with the plan and demonstrated an understanding of the instructions.   The patient was advised to call back or seek an in-person evaluation if the symptoms worsen or if the condition fails to improve as anticipated.  I provided 40 minutes of chart review, telephone,  and documentation time during this encounter, and > 50% was spent counseling as documented under my  assessment & plan.  Betsy Coder ANP/GNP-BC   09/13/2020 8:57 AM

## 2020-09-13 NOTE — Progress Notes (Signed)
START ON PATHWAY REGIMEN - Colorectal     A cycle is every 14 days:     Oxaliplatin      Leucovorin      Fluorouracil      Fluorouracil   **Always confirm dose/schedule in your pharmacy ordering system**  Patient Characteristics: Distant Metastases, Nonsurgical Candidate, KRAS/NRAS Mutation Positive/Unknown (BRAF V600 Wild-Type/Unknown), Standard Cytotoxic Therapy, First Line Standard Cytotoxic Therapy, Bevacizumab Ineligible, PS = 0,1 Tumor Location: Rectal Therapeutic Status: Distant Metastases Microsatellite/Mismatch Repair Status: MSS/pMMR BRAF Mutation Status: Awaiting Test Results KRAS/NRAS Mutation Status: Awaiting Test Results Standard Cytotoxic Line of Therapy: First Line Standard Cytotoxic Therapy ECOG Performance Status: 1 Bevacizumab Eligibility: Ineligible Intent of Therapy: Non-Curative / Palliative Intent, Discussed with Patient

## 2020-09-13 NOTE — Telephone Encounter (Signed)
Ordered Foundation One Testing on specimen [phone] 11/18/2019 rectal excision, requested Foundation One obtain specimen from [facility] for testing.

## 2020-09-17 ENCOUNTER — Encounter: Payer: Medicaid Other | Admitting: Physical Therapy

## 2020-09-17 ENCOUNTER — Telehealth: Payer: Self-pay | Admitting: *Deleted

## 2020-09-17 NOTE — Telephone Encounter (Signed)
Called patient to remind him we need a height/weight before MD can put in his chemo orders and the insurance PA process can begin. He states he will come in today.

## 2020-09-18 ENCOUNTER — Encounter: Payer: Self-pay | Admitting: *Deleted

## 2020-09-18 NOTE — Telephone Encounter (Signed)
Patient left voice mail that he needs to reschedule his appointment. Feeling too poorly to come in due to pain. Reports severe, shooting pains in his rectum and perineum with BM and having sex. Has called the surgeon and they are not helping him and said" the pain management doctor was rude to me". His return there for procedure is in December. He called Dr. Zollie Scale office for pain med to hold him till he returns to pain management and has not heard from them. Asking if Dr. Benay Spice can help him (Dr. Benay Spice has stated he will not manage his pain--surgical issue). This RN called and spoke with triage nurse, Arby Barrette regarding above and inquired if Dr. Hester Mates help him with his pain until pain management sees him in December. Dr. Benay Spice will not be managing his surgical pain. Notified patient that I called triage w/Dr. Hester Mates to inquire if he is willing to provide pain medication till his procedure on 12/14 with pain management. We will await him to come in as walk-in to have Ht/Wt check--this is 1st step in getting treatment started.

## 2020-09-24 ENCOUNTER — Encounter: Payer: Medicaid Other | Admitting: Physical Therapy

## 2020-09-24 ENCOUNTER — Ambulatory Visit: Payer: Medicaid Other | Admitting: Oncology

## 2020-09-25 ENCOUNTER — Telehealth: Payer: Self-pay | Admitting: *Deleted

## 2020-09-25 NOTE — Telephone Encounter (Signed)
Patient left voice mail asking for Dr. Benay Spice to refer him to a pain management center in [place]. Too difficult to drive to [facility] and he does not like the physician there at [facility]. He felt that they did not believe he is in pain and has to wait too long to get in. Describes how much pain he is in in the perineal are and how painful climax is with having sex.  Per Dr. Benay Spice: Suggest he call pain management centers and find out if they are taking new patients and if they accept his insurance (last insurance card on file here is 2012). He still needs to come in for height/weight before chemo orders can be entered and treatment scheduled. Sent Mychart message to patient regarding above.

## 2020-10-01 ENCOUNTER — Encounter: Payer: Medicaid Other | Admitting: Physical Therapy

## 2020-10-08 ENCOUNTER — Encounter: Payer: Medicaid Other | Admitting: Physical Therapy

## 2020-10-10 ENCOUNTER — Encounter: Payer: Self-pay | Admitting: *Deleted

## 2020-10-12 ENCOUNTER — Telehealth: Payer: Self-pay | Admitting: *Deleted

## 2020-10-12 ENCOUNTER — Telehealth: Payer: Self-pay | Admitting: Oncology

## 2020-10-12 ENCOUNTER — Encounter: Payer: Self-pay | Admitting: *Deleted

## 2020-10-12 DIAGNOSIS — C20 Malignant neoplasm of rectum: Secondary | ICD-10-CM

## 2020-10-12 NOTE — Telephone Encounter (Signed)
Scheduled appt per 12/10 sch msg - pt is aware of appt date and time on 12/29

## 2020-10-12 NOTE — Telephone Encounter (Signed)
Came in for height and weight check today after office had closed. NT was able to weigh him. Patient expressed concern about not going home with infusion pump when chemo begins and wants pain medication. Per Dr. Benay Spice: No pain meds will be given here. He is established with a pain management clinic. LOS sent for lab/flush/OV and chemo.

## 2020-10-15 ENCOUNTER — Encounter: Payer: Medicaid Other | Admitting: Physical Therapy

## 2020-10-19 ENCOUNTER — Telehealth: Payer: Self-pay | Admitting: *Deleted

## 2020-10-19 MED ORDER — PREGABALIN 25 MG PO CAPS: 25.0000 mg | ORAL_CAPSULE | Freq: Two times a day (BID) | ORAL | 0 refills | Status: DC

## 2020-10-19 NOTE — Telephone Encounter (Signed)
Notified patient that Dr. Benay Spice has agreed to send in script for pregabalin as requested.

## 2020-10-19 NOTE — Telephone Encounter (Signed)
Called to report he did have the perineal injection per pain management and it "kinda worked". Not having severe pain any longer, just uncomfortable. Was told to resume his Lyrica 25 mg bid, but he reports the bottle he has expired January 2021. Asking if Dr. Benay Spice will order the Lyrica 25 mg bid for him? Has 2nd injection in 6 weeks.

## 2020-10-25 ENCOUNTER — Other Ambulatory Visit: Payer: Self-pay | Admitting: Oncology

## 2020-10-25 DIAGNOSIS — Z7189 Other specified counseling: Secondary | ICD-10-CM

## 2020-10-25 NOTE — Progress Notes (Signed)
ON PATHWAY REGIMEN - Colorectal  No Change  Continue With Treatment as Ordered.  Original Decision Date/Time: 09/13/2020 13:47     A cycle is every 14 days:     Oxaliplatin      Leucovorin      Fluorouracil      Fluorouracil   **Always confirm dose/schedule in your pharmacy ordering system**  Patient Characteristics: Distant Metastases, Nonsurgical Candidate, KRAS/NRAS Mutation Positive/Unknown (BRAF V600 Wild-Type/Unknown), Standard Cytotoxic Therapy, First Line Standard Cytotoxic Therapy, Bevacizumab Ineligible, PS = 0,1 Tumor Location: Rectal Therapeutic Status: Distant Metastases Microsatellite/Mismatch Repair Status: MSS/pMMR BRAF Mutation Status: Awaiting Test Results KRAS/NRAS Mutation Status: Awaiting Test Results Standard Cytotoxic Line of Therapy: First Line Standard Cytotoxic Therapy ECOG Performance Status: 1 Bevacizumab Eligibility: Ineligible Intent of Therapy: Non-Curative / Palliative Intent, Discussed with Patient

## 2020-10-31 ENCOUNTER — Inpatient Hospital Stay: Payer: Medicaid Other

## 2020-10-31 ENCOUNTER — Inpatient Hospital Stay (HOSPITAL_BASED_OUTPATIENT_CLINIC_OR_DEPARTMENT_OTHER): Payer: Medicaid Other | Admitting: Oncology

## 2020-10-31 ENCOUNTER — Inpatient Hospital Stay: Payer: Medicaid Other | Attending: Oncology

## 2020-10-31 ENCOUNTER — Other Ambulatory Visit: Payer: Self-pay

## 2020-10-31 VITALS — BP 157/77 | HR 61 | Temp 97.8°F | Resp 18 | Ht 68.0 in | Wt 207.1 lb

## 2020-10-31 DIAGNOSIS — Z5111 Encounter for antineoplastic chemotherapy: Secondary | ICD-10-CM | POA: Diagnosis not present

## 2020-10-31 DIAGNOSIS — C787 Secondary malignant neoplasm of liver and intrahepatic bile duct: Secondary | ICD-10-CM | POA: Diagnosis not present

## 2020-10-31 DIAGNOSIS — R918 Other nonspecific abnormal finding of lung field: Secondary | ICD-10-CM | POA: Diagnosis not present

## 2020-10-31 DIAGNOSIS — N529 Male erectile dysfunction, unspecified: Secondary | ICD-10-CM | POA: Diagnosis not present

## 2020-10-31 DIAGNOSIS — C20 Malignant neoplasm of rectum: Secondary | ICD-10-CM

## 2020-10-31 DIAGNOSIS — G893 Neoplasm related pain (acute) (chronic): Secondary | ICD-10-CM | POA: Diagnosis not present

## 2020-10-31 DIAGNOSIS — Z86718 Personal history of other venous thrombosis and embolism: Secondary | ICD-10-CM | POA: Insufficient documentation

## 2020-10-31 DIAGNOSIS — Z95828 Presence of other vascular implants and grafts: Secondary | ICD-10-CM

## 2020-10-31 DIAGNOSIS — J45909 Unspecified asthma, uncomplicated: Secondary | ICD-10-CM | POA: Diagnosis not present

## 2020-10-31 LAB — CMP (CANCER CENTER ONLY)
ALT: 20 U/L (ref 0–44)
AST: 16 U/L (ref 15–41)
Albumin: 3.6 g/dL (ref 3.5–5.0)
Alkaline Phosphatase: 85 U/L (ref 38–126)
Anion gap: 3 — ABNORMAL LOW (ref 5–15)
BUN: 16 mg/dL (ref 6–20)
CO2: 30 mmol/L (ref 22–32)
Calcium: 9.1 mg/dL (ref 8.9–10.3)
Chloride: 109 mmol/L (ref 98–111)
Creatinine: 0.99 mg/dL (ref 0.61–1.24)
GFR, Estimated: >60 mL/min (ref >60)
Glucose, Bld: 82 mg/dL (ref 70–99)
Potassium: 4.4 mmol/L (ref 3.5–5.1)
Sodium: 142 mmol/L (ref 135–145)
Total Bilirubin: 0.3 mg/dL (ref 0.3–1.2)
Total Protein: 6.7 g/dL (ref 6.5–8.1)

## 2020-10-31 LAB — CBC WITH DIFFERENTIAL (CANCER CENTER ONLY)
Abs Immature Granulocytes: 0.01 10*3/uL (ref 0.00–0.07)
Basophils Absolute: 0 10*3/uL (ref 0.0–0.1)
Basophils Relative: 1 %
Eosinophils Absolute: 0.2 10*3/uL (ref 0.0–0.5)
Eosinophils Relative: 3 %
HCT: 39.4 % (ref 39.0–52.0)
Hemoglobin: 12.8 g/dL — ABNORMAL LOW (ref 13.0–17.0)
Immature Granulocytes: 0 %
Lymphocytes Relative: 30 %
Lymphs Abs: 1.7 10*3/uL (ref 0.7–4.0)
MCH: 28.7 pg (ref 26.0–34.0)
MCHC: 32.5 g/dL (ref 30.0–36.0)
MCV: 88.3 fL (ref 80.0–100.0)
Monocytes Absolute: 0.8 10*3/uL (ref 0.1–1.0)
Monocytes Relative: 14 %
Neutro Abs: 3 10*3/uL (ref 1.7–7.7)
Neutrophils Relative %: 52 %
Platelet Count: 286 10*3/uL (ref 150–400)
RBC: 4.46 MIL/uL (ref 4.22–5.81)
RDW: 13.9 % (ref 11.5–15.5)
WBC Count: 5.7 10*3/uL (ref 4.0–10.5)
nRBC: 0 % (ref 0.0–0.2)

## 2020-10-31 MED ORDER — PALONOSETRON HCL INJECTION 0.25 MG/5ML
INTRAVENOUS | Status: AC
  Filled 2020-10-31: qty 5

## 2020-10-31 MED ORDER — SODIUM CHLORIDE 0.9 % IV SOLN
10.0000 mg | Freq: Once | INTRAVENOUS | Status: AC
  Administered 2020-10-31: 12:00:00 10 mg via INTRAVENOUS
  Filled 2020-10-31: qty 10

## 2020-10-31 MED ORDER — LIDOCAINE-PRILOCAINE 2.5-2.5 % EX CREA: 1.0000 "application " | TOPICAL_CREAM | CUTANEOUS | 2 refills | Status: DC

## 2020-10-31 MED ORDER — LEUCOVORIN CALCIUM INJECTION 350 MG
400.0000 mg/m2 | Freq: Once | INTRAVENOUS | Status: AC
  Administered 2020-10-31: 13:00:00 848 mg via INTRAVENOUS
  Filled 2020-10-31: qty 42.4

## 2020-10-31 MED ORDER — SODIUM CHLORIDE 0.9% FLUSH
10.0000 mL | Freq: Once | INTRAVENOUS | Status: AC
  Administered 2020-10-31: 10:00:00 10 mL
  Filled 2020-10-31: qty 10

## 2020-10-31 MED ORDER — SODIUM CHLORIDE 0.9 % IV SOLN
2000.0000 mg/m2 | INTRAVENOUS | Status: DC
  Administered 2020-10-31: 15:00:00 4250 mg via INTRAVENOUS
  Filled 2020-10-31: qty 85

## 2020-10-31 MED ORDER — OXALIPLATIN CHEMO INJECTION 100 MG/20ML
85.0000 mg/m2 | Freq: Once | INTRAVENOUS | Status: AC
  Administered 2020-10-31: 180 mg via INTRAVENOUS
  Filled 2020-10-31: qty 36

## 2020-10-31 MED ORDER — PALONOSETRON HCL INJECTION 0.25 MG/5ML
0.2500 mg | Freq: Once | INTRAVENOUS | Status: AC
  Administered 2020-10-31: 12:00:00 0.25 mg via INTRAVENOUS

## 2020-10-31 MED ORDER — DEXTROSE 5 % IV SOLN
Freq: Once | INTRAVENOUS | Status: AC
Start: 2020-10-31 — End: 2020-10-31
  Filled 2020-10-31: qty 250

## 2020-10-31 MED FILL — LIDOCAINE-PRILOCAINE CREAM: 2.5-2.5 | 10 days supply | Qty: 30 | Fill #0

## 2020-10-31 NOTE — Progress Notes (Signed)
[facility] OFFICE PROGRESS NOTE   Diagnosis: Rectal cancer  INTERVAL HISTORY:   Bradley Colon returns for a scheduled visit.  He continues to have pain at the perineum and urethra.  The pain is worse when he is "cold ".  He reports partial improvement with Lyrica and with pelvic floor trigger point injection therapy he received on 10/16/2020.  He is no longer taking oxycodone.  He has constipation, but is having bowel movements. He reports mouth sores when he received chemotherapy last year at [facility] Objective:  Vital signs in last 24 hours:  Blood pressure (!) 157/77, pulse 61, temperature 97.8 F (36.6 C), temperature source Tympanic, resp. rate 18, height '5\' 8"'  (1.727 m), weight 207 lb 1.6 oz (93.9 kg), SpO2 97 %.   Resp: Lungs clear bilaterally Cardio: Regular rate and rhythm GI: No hepatosplenomegaly, no mass, nontender Vascular: No leg edema    Portacath/PICC-without erythema  Lab Results:  Lab Results  Component Value Date   WBC 5.7 10/31/2020   HGB 12.8 (L) 10/31/2020   HCT 39.4 10/31/2020   MCV 88.3 10/31/2020   PLT 286 10/31/2020   NEUTROABS 3.0 10/31/2020    CMP  Lab Results  Component Value Date   NA 142 10/31/2020   K 4.4 10/31/2020   CL 109 10/31/2020   CO2 30 10/31/2020   GLUCOSE 82 10/31/2020   BUN 16 10/31/2020   CREATININE 0.99 10/31/2020   CALCIUM 9.1 10/31/2020   PROT 6.7 10/31/2020   ALBUMIN 3.6 10/31/2020   AST 16 10/31/2020   ALT 20 10/31/2020   ALKPHOS 85 10/31/2020   BILITOT 0.3 10/31/2020   GFRNONAA >60 10/31/2020   GFRAA >60 01/02/2020    Lab Results  Component Value Date   CEA1 12.38 (H) 09/11/2020    No results found for: INR  Imaging:  No results found.  Medications: I have reviewed the patient's current medications.   Assessment/Plan:  1. Rectal cancer, clinical stage III (T3cN2), Foundation 1 on lymph node 11/18/2019-tumor mutation burden 1, FG R1 amplification, K-ras wild-type, microsatellite status  could not be determined ? Rectal mass extending from 4-9 cm on colonoscopy 02/15/2019 with a biopsy confirming invasive adenocarcinoma ? CTs 02/23/2019- thickening of the low rectum without a discrete mass, small bilateral pulmonary nodules, some present on imaging from 2014 ? Pelvic MRI 02/25/2019- T3cN2 tumor beginning at 4.9 cm from the anal verge and 0 cm from the internal anal sphincter, tumor extends through the muscularis propria, 2.  Rectal lymph nodes and 2 enlarged right internal iliac nodes ? [facility] at [facility] 04/19/2019- new subcentimeter right hepatic lesion concerning for metastasis, stable low rectal mass with prominent mesorectal lymph nodes, stable pathologic enlarged right internal iliac nodes, stable pulmonary nodules ? Xeloda/radiation beginning 04/26/2019, completed 07/05/2019 ? CT 07/26/2019-growth of previously described right hepatic lesion, slight decrease in pelvic lymphadenopathy and rectal thickening ? 08/05/2019-week 1 bolus 5-FU/leucovorin (200 mg/m) ? 08/12/2019-week to bolus 5-FU/leucovorin (400 mg/m) ? 10/16/ 2020-week 3 bolus 5-FU/leucovorin (400 mg/m) and oxaliplatin (42.5 mg/m) ? CT chest 10/04/2019-slight increase in bilateral pulmonary nodules concerning for metastases ? MRI abdomen 10/06/2019-right hepatic lesion consistent with metastasis, wall thickening of the low anterior rectum, no new or enlarging adenopathy, stable mesorectal and right internal iliac nodes ? PET scan 10/14/2019-pulmonary nodules below PET resolution, hypermetabolic right posterior hepatic lesion, mildly hypermetabolic partially calcified right internal iliac chain lymphadenopathy, mild gastric hypermetabolism and thickening-likely gastritis ? 11/18/2019-LAR with diverting loop ileostomy and ablation of solitary liver metastasis (  2 cm segment 7), moderately differentiated adenocarcinoma invading into soft tissue, LVI present, perineural invasion present, 5/15 lymph nodes positive, 4 tumor deposits, absent  treatment effect-score 3,ypT3 ypN2a  ? CT chest 01/16/2020-increase in the size of a dominant right lower lobe nodule, unchanged left upper lobe and left lower lobe nodules ? CT chest 04/11/2020-enlargement of bilateral lung nodules, and new nodules ? CT chest 09/11/2020-enlargement of bilateral lung nodules, aggressive medial right liver lesion ? Cycle 1 FOLFOX 10/31/2020 2. Pain secondary to #1, improved 3. Reactive airways disease/asthma 4. Report of multiple allergens 5. Port-A-Cath related DVT, right IJ, 09/30/2019-apixaban, converted to Lovenox surrounding surgery January 2021, changed back to apixaban 12/30/2019 6. Erectile dysfunction following treatment for rectal cancer-followed by urology      Disposition: Mr. Frate has metastatic rectal cancer.  He will begin FOLFOX chemotherapy today.  We again reviewed potential toxicities associated with the FOLFOX regimen including the chance of nausea/vomiting, mucositis, diarrhea, alopecia, and hematologic toxicity.  We discussed the allergic reaction and neuropathy associated with oxaliplatin.  He agrees to proceed.  I will hold the 5-FU bolus with his report of mucositis while treated with bolus 5-FU last year.  He will continue follow-up at the [facility] pain clinic for management of perineal and urethral pain.  Mr. Salek will return for an office visit and chemotherapy in 2 weeks.  We will consider adding bevacizumab or an EGFR inhibitor pending his tolerance of FOLFOX.  Betsy Coder, MD  10/31/2020  11:00 AM

## 2020-10-31 NOTE — Patient Instructions (Signed)
Leucovorin injection What is this medicine? LEUCOVORIN (loo koe VOR in) is used to prevent or treat the harmful effects of some medicines. This medicine is used to treat anemia caused by a low amount of folic acid in the body. It is also used with 5-fluorouracil (5-FU) to treat colon cancer. This medicine may be used for other purposes; ask your health care provider or pharmacist if you have questions. What should I tell my health care provider before I take this medicine? They need to know if you have any of these conditions:  anemia from low levels of vitamin B-12 in the blood  an unusual or allergic reaction to leucovorin, folic acid, other medicines, foods, dyes, or preservatives  pregnant or trying to get pregnant  breast-feeding How should I use this medicine? This medicine is for injection into a muscle or into a vein. It is given by a health care professional in a hospital or clinic setting. Talk to your pediatrician regarding the use of this medicine in children. Special care may be needed. Overdosage: If you think you have taken too much of this medicine contact a poison control center or emergency room at once. NOTE: This medicine is only for you. Do not share this medicine with others. What if I miss a dose? This does not apply. What may interact with this medicine?  capecitabine  fluorouracil  phenobarbital  phenytoin  primidone  trimethoprim-sulfamethoxazole This list may not describe all possible interactions. Give your health care provider a list of all the medicines, herbs, non-prescription drugs, or dietary supplements you use. Also tell them if you smoke, drink alcohol, or use illegal drugs. Some items may interact with your medicine. What should I watch for while using this medicine? Your condition will be monitored carefully while you are receiving this medicine. This medicine may increase the side effects of 5-fluorouracil, 5-FU. Tell your doctor or health  care professional if you have diarrhea or mouth sores that do not get better or that get worse. What side effects may I notice from receiving this medicine? Side effects that you should report to your doctor or health care professional as soon as possible:  allergic reactions like skin rash, itching or hives, swelling of the face, lips, or tongue  breathing problems  fever, infection  mouth sores  unusual bleeding or bruising  unusually weak or tired Side effects that usually do not require medical attention (report to your doctor or health care professional if they continue or are bothersome):  constipation or diarrhea  loss of appetite  nausea, vomiting This list may not describe all possible side effects. Call your doctor for medical advice about side effects. You may report side effects to FDA at 1-800-FDA-1088. Where should I keep my medicine? This drug is given in a hospital or clinic and will not be stored at home. NOTE: This sheet is a summary. It may not cover all possible information. If you have questions about this medicine, talk to your doctor, pharmacist, or health care provider.  2020 Elsevier/Gold Standard (2008-04-25 16:50:29) Fluorouracil, 5-FU injection What is this medicine? FLUOROURACIL, 5-FU (flure oh YOOR a sil) is a chemotherapy drug. It slows the growth of cancer cells. This medicine is used to treat many types of cancer like breast cancer, colon or rectal cancer, pancreatic cancer, and stomach cancer. This medicine may be used for other purposes; ask your health care provider or pharmacist if you have questions. COMMON BRAND NAME(S): Adrucil What should I tell my  health care provider before I take this medicine? They need to know if you have any of these conditions:  blood disorders  dihydropyrimidine dehydrogenase (DPD) deficiency  infection (especially a virus infection such as chickenpox, cold sores, or herpes)  kidney disease  liver  disease  malnourished, poor nutrition  recent or ongoing radiation therapy  an unusual or allergic reaction to fluorouracil, other chemotherapy, other medicines, foods, dyes, or preservatives  pregnant or trying to get pregnant  breast-feeding How should I use this medicine? This drug is given as an infusion or injection into a vein. It is administered in a hospital or clinic by a specially trained health care professional. Talk to your pediatrician regarding the use of this medicine in children. Special care may be needed. Overdosage: If you think you have taken too much of this medicine contact a poison control center or emergency room at once. NOTE: This medicine is only for you. Do not share this medicine with others. What if I miss a dose? It is important not to miss your dose. Call your doctor or health care professional if you are unable to keep an appointment. What may interact with this medicine?  allopurinol  cimetidine  dapsone  digoxin  hydroxyurea  leucovorin  levamisole  medicines for seizures like ethotoin, fosphenytoin, phenytoin  medicines to increase blood counts like filgrastim, pegfilgrastim, sargramostim  medicines that treat or prevent blood clots like warfarin, enoxaparin, and dalteparin  methotrexate  metronidazole  pyrimethamine  some other chemotherapy drugs like busulfan, cisplatin, estramustine, vinblastine  trimethoprim  trimetrexate  vaccines Talk to your doctor or health care professional before taking any of these medicines:  acetaminophen  aspirin  ibuprofen  ketoprofen  naproxen This list may not describe all possible interactions. Give your health care provider a list of all the medicines, herbs, non-prescription drugs, or dietary supplements you use. Also tell them if you smoke, drink alcohol, or use illegal drugs. Some items may interact with your medicine. What should I watch for while using this medicine? Visit  your doctor for checks on your progress. This drug may make you feel generally unwell. This is not uncommon, as chemotherapy can affect healthy cells as well as cancer cells. Report any side effects. Continue your course of treatment even though you feel ill unless your doctor tells you to stop. In some cases, you may be given additional medicines to help with side effects. Follow all directions for their use. Call your doctor or health care professional for advice if you get a fever, chills or sore throat, or other symptoms of a cold or flu. Do not treat yourself. This drug decreases your body's ability to fight infections. Try to avoid being around people who are sick. This medicine may increase your risk to bruise or bleed. Call your doctor or health care professional if you notice any unusual bleeding. Be careful brushing and flossing your teeth or using a toothpick because you may get an infection or bleed more easily. If you have any dental work done, tell your dentist you are receiving this medicine. Avoid taking products that contain aspirin, acetaminophen, ibuprofen, naproxen, or ketoprofen unless instructed by your doctor. These medicines may hide a fever. Do not become pregnant while taking this medicine. Women should inform their doctor if they wish to become pregnant or think they might be pregnant. There is a potential for serious side effects to an unborn child. Talk to your health care professional or pharmacist for more information. Do  not breast-feed an infant while taking this medicine. Men should inform their doctor if they wish to father a child. This medicine may lower sperm counts. Do not treat diarrhea with over the counter products. Contact your doctor if you have diarrhea that lasts more than 2 days or if it is severe and watery. This medicine can make you more sensitive to the sun. Keep out of the sun. If you cannot avoid being in the sun, wear protective clothing and use  sunscreen. Do not use sun lamps or tanning beds/booths. What side effects may I notice from receiving this medicine? Side effects that you should report to your doctor or health care professional as soon as possible:  allergic reactions like skin rash, itching or hives, swelling of the face, lips, or tongue  low blood counts - this medicine may decrease the number of white blood cells, red blood cells and platelets. You may be at increased risk for infections and bleeding.  signs of infection - fever or chills, cough, sore throat, pain or difficulty passing urine  signs of decreased platelets or bleeding - bruising, pinpoint red spots on the skin, black, tarry stools, blood in the urine  signs of decreased red blood cells - unusually weak or tired, fainting spells, lightheadedness  breathing problems  changes in vision  chest pain  mouth sores  nausea and vomiting  pain, swelling, redness at site where injected  pain, tingling, numbness in the hands or feet  redness, swelling, or sores on hands or feet  stomach pain  unusual bleeding Side effects that usually do not require medical attention (report to your doctor or health care professional if they continue or are bothersome):  changes in finger or toe nails  diarrhea  dry or itchy skin  hair loss  headache  loss of appetite  sensitivity of eyes to the light  stomach upset  unusually teary eyes This list may not describe all possible side effects. Call your doctor for medical advice about side effects. You may report side effects to FDA at 1-800-FDA-1088. Where should I keep my medicine? This drug is given in a hospital or clinic and will not be stored at home. NOTE: This sheet is a summary. It may not cover all possible information. If you have questions about this medicine, talk to your doctor, pharmacist, or health care provider.  2020 Elsevier/Gold Standard (2008-02-23 13:53:16) Oxaliplatin  Injection What is this medicine? OXALIPLATIN (ox AL i PLA tin) is a chemotherapy drug. It targets fast dividing cells, like cancer cells, and causes these cells to die. This medicine is used to treat cancers of the colon and rectum, and many other cancers. This medicine may be used for other purposes; ask your health care provider or pharmacist if you have questions. COMMON BRAND NAME(S): Eloxatin What should I tell my health care provider before I take this medicine? They need to know if you have any of these conditions:  heart disease  history of irregular heartbeat  liver disease  low blood counts, like white cells, platelets, or red blood cells  lung or breathing disease, like asthma  take medicines that treat or prevent blood clots  tingling of the fingers or toes, or other nerve disorder  an unusual or allergic reaction to oxaliplatin, other chemotherapy, other medicines, foods, dyes, or preservatives  pregnant or trying to get pregnant  breast-feeding How should I use this medicine? This drug is given as an infusion into a vein. It is administered  in a hospital or clinic by a specially trained health care professional. Talk to your pediatrician regarding the use of this medicine in children. Special care may be needed. Overdosage: If you think you have taken too much of this medicine contact a poison control center or emergency room at once. NOTE: This medicine is only for you. Do not share this medicine with others. What if I miss a dose? It is important not to miss a dose. Call your doctor or health care professional if you are unable to keep an appointment. What may interact with this medicine? Do not take this medicine with any of the following medications:  cisapride  dronedarone  pimozide  thioridazine This medicine may also interact with the following medications:  aspirin and aspirin-like medicines  certain medicines that treat or prevent blood clots  like warfarin, apixaban, dabigatran, and rivaroxaban  cisplatin  cyclosporine  diuretics  medicines for infection like acyclovir, adefovir, amphotericin B, bacitracin, cidofovir, foscarnet, ganciclovir, gentamicin, pentamidine, vancomycin  NSAIDs, medicines for pain and inflammation, like ibuprofen or naproxen  other medicines that prolong the QT interval (an abnormal heart rhythm)  pamidronate  zoledronic acid This list may not describe all possible interactions. Give your health care provider a list of all the medicines, herbs, non-prescription drugs, or dietary supplements you use. Also tell them if you smoke, drink alcohol, or use illegal drugs. Some items may interact with your medicine. What should I watch for while using this medicine? Your condition will be monitored carefully while you are receiving this medicine. You may need blood work done while you are taking this medicine. This medicine may make you feel generally unwell. This is not uncommon as chemotherapy can affect healthy cells as well as cancer cells. Report any side effects. Continue your course of treatment even though you feel ill unless your healthcare professional tells you to stop. This medicine can make you more sensitive to cold. Do not drink cold drinks or use ice. Cover exposed skin before coming in contact with cold temperatures or cold objects. When out in cold weather wear warm clothing and cover your mouth and nose to warm the air that goes into your lungs. Tell your doctor if you get sensitive to the cold. Do not become pregnant while taking this medicine or for 9 months after stopping it. Women should inform their health care professional if they wish to become pregnant or think they might be pregnant. Men should not father a child while taking this medicine and for 6 months after stopping it. There is potential for serious side effects to an unborn child. Talk to your health care professional for more  information. Do not breast-feed a child while taking this medicine or for 3 months after stopping it. This medicine has caused ovarian failure in some women. This medicine may make it more difficult to get pregnant. Talk to your health care professional if you are concerned about your fertility. This medicine has caused decreased sperm counts in some men. This may make it more difficult to father a child. Talk to your health care professional if you are concerned about your fertility. This medicine may increase your risk of getting an infection. Call your health care professional for advice if you get a fever, chills, or sore throat, or other symptoms of a cold or flu. Do not treat yourself. Try to avoid being around people who are sick. Avoid taking medicines that contain aspirin, acetaminophen, ibuprofen, naproxen, or ketoprofen unless instructed by  your health care professional. These medicines may hide a fever. Be careful brushing or flossing your teeth or using a toothpick because you may get an infection or bleed more easily. If you have any dental work done, tell your dentist you are receiving this medicine. What side effects may I notice from receiving this medicine? Side effects that you should report to your doctor or health care professional as soon as possible:  allergic reactions like skin rash, itching or hives, swelling of the face, lips, or tongue  breathing problems  cough  low blood counts - this medicine may decrease the number of white blood cells, red blood cells, and platelets. You may be at increased risk for infections and bleeding  nausea, vomiting  pain, redness, or irritation at site where injected  pain, tingling, numbness in the hands or feet  signs and symptoms of bleeding such as bloody or black, tarry stools; red or dark brown urine; spitting up blood or brown material that looks like coffee grounds; red spots on the skin; unusual bruising or bleeding from the  eyes, gums, or nose  signs and symptoms of a dangerous change in heartbeat or heart rhythm like chest pain; dizziness; fast, irregular heartbeat; palpitations; feeling faint or lightheaded; falls  signs and symptoms of infection like fever; chills; cough; sore throat; pain or trouble passing urine  signs and symptoms of liver injury like dark yellow or brown urine; general ill feeling or flu-like symptoms; light-colored stools; loss of appetite; nausea; right upper belly pain; unusually weak or tired; yellowing of the eyes or skin  signs and symptoms of low red blood cells or anemia such as unusually weak or tired; feeling faint or lightheaded; falls  signs and symptoms of muscle injury like dark urine; trouble passing urine or change in the amount of urine; unusually weak or tired; muscle pain; back pain Side effects that usually do not require medical attention (report to your doctor or health care professional if they continue or are bothersome):  changes in taste  diarrhea  gas  hair loss  loss of appetite  mouth sores This list may not describe all possible side effects. Call your doctor for medical advice about side effects. You may report side effects to FDA at 1-800-FDA-1088. Where should I keep my medicine? This drug is given in a hospital or clinic and will not be stored at home. NOTE: This sheet is a summary. It may not cover all possible information. If you have questions about this medicine, talk to your doctor, pharmacist, or health care provider.  2020 Elsevier/Gold Standard (2019-03-09 12:20:35)

## 2020-11-01 NOTE — Progress Notes (Signed)
I have requested slides and blocks from specimen [id number] collected 11/18/2019 be sent to [facility]ology Department for MSI testing per Dr. Geanie Cooley.

## 2020-11-02 ENCOUNTER — Inpatient Hospital Stay: Payer: Medicaid Other

## 2020-11-02 ENCOUNTER — Other Ambulatory Visit: Payer: Self-pay

## 2020-11-02 VITALS — BP 139/92 | HR 70 | Temp 97.3°F | Resp 20

## 2020-11-02 DIAGNOSIS — C20 Malignant neoplasm of rectum: Secondary | ICD-10-CM

## 2020-11-02 DIAGNOSIS — Z5111 Encounter for antineoplastic chemotherapy: Secondary | ICD-10-CM | POA: Diagnosis not present

## 2020-11-02 MED ORDER — HEPARIN SOD (PORK) LOCK FLUSH 100 UNIT/ML IV SOLN
500.0000 [IU] | Freq: Once | INTRAVENOUS | Status: AC | PRN
  Administered 2020-11-02: 500 [IU]
  Filled 2020-11-02: qty 5

## 2020-11-02 MED ORDER — SODIUM CHLORIDE 0.9% FLUSH
10.0000 mL | INTRAVENOUS | Status: DC | PRN
  Administered 2020-11-02: 10 mL
  Filled 2020-11-02: qty 10

## 2020-11-02 NOTE — Patient Instructions (Signed)

## 2020-11-02 NOTE — Progress Notes (Signed)
Pt in clinic today for pump d/c.  Pt requesting message be sent to Dr Benay Spice regarding pain mgmt.  Pt rectal/perineum pain unbearable.  Pt states pain clinic is not helping at this time, and surgeon has dropped him from his practice.  Message sent to Dr Benay Spice with this information/request to follow up with patient.

## 2020-11-03 ENCOUNTER — Encounter: Payer: Self-pay | Admitting: Oncology

## 2020-11-05 ENCOUNTER — Encounter: Payer: Self-pay | Admitting: Oncology

## 2020-11-05 ENCOUNTER — Other Ambulatory Visit: Payer: Self-pay | Admitting: Oncology

## 2020-11-05 ENCOUNTER — Telehealth: Payer: Self-pay | Admitting: Oncology

## 2020-11-05 ENCOUNTER — Telehealth: Payer: Self-pay | Admitting: *Deleted

## 2020-11-05 MED ORDER — DEXAMETHASONE 4 MG PO TABS: 4.0000 mg | ORAL_TABLET | Freq: Two times a day (BID) | ORAL | 0 refills | Status: DC

## 2020-11-05 NOTE — Telephone Encounter (Signed)
Patient reporting feeling of cold in his chest and inability to breathe deeply since his treatment. Has been using his nebulizer and took some leftover Prednisone and is better today. Reported this over the weekend and was told to go to ER--he went, but left after seeing the room full. Reports persistent nausea despite Zofran every 8 hours and compazine every 6 hours. Reports MD used to give him a steroid for a few days after each treatment. Also has bad hemorrhoids and wants MD to call in more Proctosol HC 2% cream and suppository. Feeling very weak and washed out as well. Informed him that the oxaliplatin can cause the uncomfortable breathing sensation if he gets cold. Dr. Benay Spice agrees to give him Decadron 4 mg bid x 2 days currently and will re-evaluate at next treatment. OK to order hemorrhoid cream/suppository when he confirms the preparation he was taking by MyChart tomorrow.

## 2020-11-05 NOTE — Telephone Encounter (Signed)
Scheduled appointments per 12/29 los. Called patient, no answer. Left message with appointments dates and times.

## 2020-11-06 ENCOUNTER — Encounter: Payer: Self-pay | Admitting: Oncology

## 2020-11-06 ENCOUNTER — Other Ambulatory Visit: Payer: Self-pay | Admitting: *Deleted

## 2020-11-06 MED ORDER — HYDROCORTISONE ACETATE 25 MG RE SUPP: 25.0000 mg | Freq: Two times a day (BID) | RECTAL | 1 refills | Status: DC | PRN

## 2020-11-06 MED FILL — DEXAMETHASONE 4 MG TABLET: 4 | 2 days supply | Qty: 4 | Fill #0

## 2020-11-06 NOTE — Progress Notes (Signed)
Sent script for hydrocortisone acetate 25 mg per patient request.

## 2020-11-09 ENCOUNTER — Telehealth: Payer: Self-pay | Admitting: *Deleted

## 2020-11-09 ENCOUNTER — Encounter: Payer: Self-pay | Admitting: Oncology

## 2020-11-09 ENCOUNTER — Other Ambulatory Visit: Payer: Self-pay | Admitting: *Deleted

## 2020-11-09 DIAGNOSIS — C20 Malignant neoplasm of rectum: Secondary | ICD-10-CM

## 2020-11-09 NOTE — Progress Notes (Signed)
IV fluid orders placed for 11/10/20

## 2020-11-09 NOTE — Telephone Encounter (Addendum)
Patient sent MyChart message that he is feeling very weak. Offered to give 1 liter fluids tomorrow at [facility] and he agrees. Asking for lab work--informed him our lab is not open on Saturday. Will check his labs next week on 11/13/20

## 2020-11-10 ENCOUNTER — Other Ambulatory Visit: Payer: Self-pay

## 2020-11-10 ENCOUNTER — Ambulatory Visit: Payer: Medicaid Other

## 2020-11-10 VITALS — BP 140/100 | HR 102 | Temp 97.8°F | Resp 20

## 2020-11-10 MED ORDER — ALTEPLASE 2 MG IJ SOLR
2.0000 mg | Freq: Once | INTRAMUSCULAR | Status: AC
  Administered 2020-11-10: 2 mg
  Filled 2020-11-10: qty 2

## 2020-11-10 MED ORDER — SODIUM CHLORIDE 0.9 % IV SOLN
INTRAVENOUS | Status: AC
  Filled 2020-11-10 (×2): qty 250

## 2020-11-10 NOTE — Patient Instructions (Signed)

## 2020-11-11 ENCOUNTER — Other Ambulatory Visit: Payer: Self-pay | Admitting: Oncology

## 2020-11-13 ENCOUNTER — Inpatient Hospital Stay: Payer: Medicaid Other

## 2020-11-13 ENCOUNTER — Other Ambulatory Visit: Payer: Self-pay

## 2020-11-13 ENCOUNTER — Inpatient Hospital Stay (HOSPITAL_BASED_OUTPATIENT_CLINIC_OR_DEPARTMENT_OTHER): Payer: Medicaid Other | Admitting: Oncology

## 2020-11-13 ENCOUNTER — Inpatient Hospital Stay: Payer: Medicaid Other | Attending: Oncology

## 2020-11-13 VITALS — BP 137/79 | HR 60 | Temp 98.1°F | Resp 18 | Ht 68.0 in | Wt 205.2 lb

## 2020-11-13 DIAGNOSIS — C20 Malignant neoplasm of rectum: Secondary | ICD-10-CM

## 2020-11-13 DIAGNOSIS — Z95828 Presence of other vascular implants and grafts: Secondary | ICD-10-CM

## 2020-11-13 DIAGNOSIS — C787 Secondary malignant neoplasm of liver and intrahepatic bile duct: Secondary | ICD-10-CM | POA: Insufficient documentation

## 2020-11-13 DIAGNOSIS — Z452 Encounter for adjustment and management of vascular access device: Secondary | ICD-10-CM | POA: Insufficient documentation

## 2020-11-13 DIAGNOSIS — Z5111 Encounter for antineoplastic chemotherapy: Secondary | ICD-10-CM | POA: Diagnosis present

## 2020-11-13 DIAGNOSIS — K1230 Oral mucositis (ulcerative), unspecified: Secondary | ICD-10-CM | POA: Diagnosis not present

## 2020-11-13 LAB — CMP (CANCER CENTER ONLY)
ALT: 22 U/L (ref 0–44)
AST: 17 U/L (ref 15–41)
Albumin: 3.8 g/dL (ref 3.5–5.0)
Alkaline Phosphatase: 75 U/L (ref 38–126)
Anion gap: 6 (ref 5–15)
BUN: 18 mg/dL (ref 6–20)
CO2: 28 mmol/L (ref 22–32)
Calcium: 9.7 mg/dL (ref 8.9–10.3)
Chloride: 106 mmol/L (ref 98–111)
Creatinine: 1.11 mg/dL (ref 0.61–1.24)
GFR, Estimated: >60 mL/min (ref >60)
Glucose, Bld: 85 mg/dL (ref 70–99)
Potassium: 4.6 mmol/L (ref 3.5–5.1)
Sodium: 140 mmol/L (ref 135–145)
Total Bilirubin: 0.5 mg/dL (ref 0.3–1.2)
Total Protein: 7 g/dL (ref 6.5–8.1)

## 2020-11-13 LAB — CBC WITH DIFFERENTIAL (CANCER CENTER ONLY)
Abs Immature Granulocytes: 0.01 10*3/uL (ref 0.00–0.07)
Basophils Absolute: 0.1 10*3/uL (ref 0.0–0.1)
Basophils Relative: 1 %
Eosinophils Absolute: 0.2 10*3/uL (ref 0.0–0.5)
Eosinophils Relative: 4 %
HCT: 39 % (ref 39.0–52.0)
Hemoglobin: 12.7 g/dL — ABNORMAL LOW (ref 13.0–17.0)
Immature Granulocytes: 0 %
Lymphocytes Relative: 37 %
Lymphs Abs: 1.9 10*3/uL (ref 0.7–4.0)
MCH: 28.9 pg (ref 26.0–34.0)
MCHC: 32.6 g/dL (ref 30.0–36.0)
MCV: 88.8 fL (ref 80.0–100.0)
Monocytes Absolute: 0.8 10*3/uL (ref 0.1–1.0)
Monocytes Relative: 15 %
Neutro Abs: 2.2 10*3/uL (ref 1.7–7.7)
Neutrophils Relative %: 43 %
Platelet Count: 299 10*3/uL (ref 150–400)
RBC: 4.39 MIL/uL (ref 4.22–5.81)
RDW: 13.7 % (ref 11.5–15.5)
WBC Count: 5 10*3/uL (ref 4.0–10.5)
nRBC: 0 % (ref 0.0–0.2)

## 2020-11-13 MED ORDER — ALBUTEROL SULFATE HFA 108 (90 BASE) MCG/ACT IN AERS: 2.0000 | INHALATION_SPRAY | Freq: Four times a day (QID) | RESPIRATORY_TRACT | 1 refills | Status: DC | PRN

## 2020-11-13 MED ORDER — DEXAMETHASONE 4 MG PO TABS: 4.0000 mg | ORAL_TABLET | Freq: Two times a day (BID) | ORAL | 0 refills | Status: DC

## 2020-11-13 MED ORDER — APIXABAN 5 MG PO TABS: 5.0000 mg | ORAL_TABLET | Freq: Two times a day (BID) | ORAL | 3 refills | Status: DC

## 2020-11-13 MED ORDER — SODIUM CHLORIDE 0.9% FLUSH
10.0000 mL | Freq: Once | INTRAVENOUS | Status: DC
  Filled 2020-11-13: qty 10

## 2020-11-13 MED FILL — PROAIR HFA 90 MCG INHALER: 108 (90 BAS | 25 days supply | Qty: 9 | Fill #0

## 2020-11-13 MED FILL — DEXAMETHASONE 4 MG TABLET: 4 | 12 days supply | Qty: 24 | Fill #0

## 2020-11-13 MED FILL — ELIQUIS 5 MG TABLET: 5 | 30 days supply | Qty: 60 | Fill #0

## 2020-11-13 NOTE — Progress Notes (Signed)
Pt requested labs be drawn peripherally.

## 2020-11-13 NOTE — Progress Notes (Signed)
[place] OFFICE PROGRESS NOTE   Diagnosis: Rectal cancer  INTERVAL HISTORY:   Mr Vora completed a cycle of FOLFOX beginning 10/31/2020.  He reports dyspnea after the 5-FU pump was disconnected.  The dyspnea improved when he rested at home.  He has a history of asthma and is out of his inhaler.  He had nausea following chemotherapy, but no emesis.  He was prescribed Decadron and this helped.  The rectal pain remains much improved.  A "hemorrhoid "has resolved.  Lyrica has helped the penile pain.  He reports taking only 1 oxycodone tablet over the past few weeks.  Objective:  Vital signs in last 24 hours:  Blood pressure 137/79, pulse 60, temperature 98.1 F (36.7 C), temperature source Tympanic, resp. rate 18, height '5\' 8"'  (1.727 m), weight 205 lb 3.2 oz (93.1 kg), SpO2 99 %.    HEENT: No thrush or ulcers Resp: Mild bilateral expiratory wheeze, no respiratory distress Cardio: Regular rate and rhythm GI: No mass, nontender, no hepatosplenomegaly Vascular: No leg edema   Portacath/PICC-without erythema  Lab Results:  Lab Results  Component Value Date   WBC 5.7 10/31/2020   HGB 12.8 (L) 10/31/2020   HCT 39.4 10/31/2020   MCV 88.3 10/31/2020   PLT 286 10/31/2020   NEUTROABS 3.0 10/31/2020    CMP  Lab Results  Component Value Date   NA 142 10/31/2020   K 4.4 10/31/2020   CL 109 10/31/2020   CO2 30 10/31/2020   GLUCOSE 82 10/31/2020   BUN 16 10/31/2020   CREATININE 0.99 10/31/2020   CALCIUM 9.1 10/31/2020   PROT 6.7 10/31/2020   ALBUMIN 3.6 10/31/2020   AST 16 10/31/2020   ALT 20 10/31/2020   ALKPHOS 85 10/31/2020   BILITOT 0.3 10/31/2020   GFRNONAA >60 10/31/2020   GFRAA >60 01/02/2020    Lab Results  Component Value Date   CEA1 12.38 (H) 09/11/2020     Medications: I have reviewed the patient's current medications.   Assessment/Plan: 1. Rectal cancer, clinical stage III (T3cN2), Foundation 1 on lymph node 11/18/2019-tumor mutation  burden 1, FG R1 amplification, K-ras wild-type, microsatellite status could not be determined ? Rectal mass extending from 4-9 cm on colonoscopy 02/15/2019 with a biopsy confirming invasive adenocarcinoma ? CTs 02/23/2019- thickening of the low rectum without a discrete mass, small bilateral pulmonary nodules, some present on imaging from 2014 ? Pelvic MRI 02/25/2019- T3cN2 tumor beginning at 4.9 cm from the anal verge and 0 cm from the internal anal sphincter, tumor extends through the muscularis propria, 2.  Rectal lymph nodes and 2 enlarged right internal iliac nodes ? [facility] at [facility] 04/19/2019- new subcentimeter right hepatic lesion concerning for metastasis, stable low rectal mass with prominent mesorectal lymph nodes, stable pathologic enlarged right internal iliac nodes, stable pulmonary nodules ? Xeloda/radiation beginning 04/26/2019, completed 07/05/2019 ? CT 07/26/2019-growth of previously described right hepatic lesion, slight decrease in pelvic lymphadenopathy and rectal thickening ? 08/05/2019-week 1 bolus 5-FU/leucovorin (200 mg/m) ? 08/12/2019-week to bolus 5-FU/leucovorin (400 mg/m) ? 10/16/ 2020-week 3 bolus 5-FU/leucovorin (400 mg/m) and oxaliplatin (42.5 mg/m) ? CT chest 10/04/2019-slight increase in bilateral pulmonary nodules concerning for metastases ? MRI abdomen 10/06/2019-right hepatic lesion consistent with metastasis, wall thickening of the low anterior rectum, no new or enlarging adenopathy, stable mesorectal and right internal iliac nodes ? PET scan 10/14/2019-pulmonary nodules below PET resolution, hypermetabolic right posterior hepatic lesion, mildly hypermetabolic partially calcified right internal iliac chain lymphadenopathy, mild gastric hypermetabolism and thickening-likely gastritis ?  11/18/2019-LAR with diverting loop ileostomy and ablation of solitary liver metastasis (2 cm segment 7), moderately differentiated adenocarcinoma invading into soft tissue, LVI present, perineural  invasion present, 5/15 lymph nodes positive, 4 tumor deposits, absent treatment effect-score 3,ypT3 ypN2a  ? CT chest 01/16/2020-increase in the size of a dominant right lower lobe nodule, unchanged left upper lobe and left lower lobe nodules ? CT chest 04/11/2020-enlargement of bilateral lung nodules, and new nodules ? CT chest 09/11/2020-enlargement of bilateral lung nodules, aggressive medial right liver lesion ? Cycle 1 FOLFOX 10/31/2020 ? Cycle 2 FOLFOX 11/14/2020 2. Pain secondary to #1, improved 3. Reactive airways disease/asthma 4. Report of multiple allergens 5. Port-A-Cath related DVT, right IJ, 09/30/2019-apixaban, converted to Lovenox surrounding surgery January 2021, changed back to apixaban 12/30/2019 6. Erectile dysfunction following treatment for rectal cancer-followed by urology   Disposition: Mr Diguglielmo completed 1 cycle of FOLFOX.  He tolerated the chemotherapy well.  His overall clinical status has improved over the past several weeks.  He will continue the current pain regimen.  He will return for cycle 2 FOLFOX tomorrow.  Mr Wiegman will be scheduled for an office visit and cycle 3 FOLFOX in 2 weeks.  The plan is to complete 5 cycles of FOLFOX prior to a restaging chest CT.  Betsy Coder, MD  11/13/2020  10:53 AM

## 2020-11-14 ENCOUNTER — Ambulatory Visit: Payer: Medicaid Other | Admitting: Oncology

## 2020-11-14 ENCOUNTER — Inpatient Hospital Stay: Payer: Medicaid Other

## 2020-11-14 ENCOUNTER — Telehealth: Payer: Self-pay | Admitting: Nurse Practitioner

## 2020-11-14 ENCOUNTER — Other Ambulatory Visit: Payer: Medicaid Other

## 2020-11-14 ENCOUNTER — Telehealth: Payer: Self-pay | Admitting: Oncology

## 2020-11-14 ENCOUNTER — Other Ambulatory Visit: Payer: Self-pay

## 2020-11-14 VITALS — BP 159/95 | HR 81 | Temp 97.8°F | Resp 18

## 2020-11-14 DIAGNOSIS — C20 Malignant neoplasm of rectum: Secondary | ICD-10-CM

## 2020-11-14 DIAGNOSIS — Z5111 Encounter for antineoplastic chemotherapy: Secondary | ICD-10-CM | POA: Diagnosis not present

## 2020-11-14 MED ORDER — OXALIPLATIN CHEMO INJECTION 100 MG/20ML
85.0000 mg/m2 | Freq: Once | INTRAVENOUS | Status: AC
  Administered 2020-11-14: 180 mg via INTRAVENOUS
  Filled 2020-11-14: qty 36

## 2020-11-14 MED ORDER — DEXTROSE 5 % IV SOLN
Freq: Once | INTRAVENOUS | Status: AC
  Filled 2020-11-14: qty 250

## 2020-11-14 MED ORDER — PALONOSETRON HCL INJECTION 0.25 MG/5ML
INTRAVENOUS | Status: AC
  Filled 2020-11-14: qty 5

## 2020-11-14 MED ORDER — FLUOROURACIL CHEMO INJECTION 5 GM/100ML
2000.0000 mg/m2 | INTRAVENOUS | Status: DC
  Administered 2020-11-14: 4250 mg via INTRAVENOUS
  Filled 2020-11-14: qty 85

## 2020-11-14 MED ORDER — LEUCOVORIN CALCIUM INJECTION 350 MG
400.0000 mg/m2 | Freq: Once | INTRAVENOUS | Status: AC
  Administered 2020-11-14: 848 mg via INTRAVENOUS
  Filled 2020-11-14: qty 42.4

## 2020-11-14 MED ORDER — SODIUM CHLORIDE 0.9 % IV SOLN
10.0000 mg | Freq: Once | INTRAVENOUS | Status: AC
  Administered 2020-11-14: 10 mg via INTRAVENOUS
  Filled 2020-11-14: qty 10

## 2020-11-14 MED ORDER — PALONOSETRON HCL INJECTION 0.25 MG/5ML
0.2500 mg | Freq: Once | INTRAVENOUS | Status: AC
  Administered 2020-11-14: 0.25 mg via INTRAVENOUS

## 2020-11-14 NOTE — Telephone Encounter (Signed)
Changed appointment time per 1/12 schedule message. Patient is aware of changes.

## 2020-11-14 NOTE — Patient Instructions (Signed)
  [place] Discharge Instructions for Patients Receiving Chemotherapy  Today you received the following chemotherapy agents: Oxaliplatin, leucovorin, fluorouracil   To help prevent nausea and vomiting after your treatment, we encourage you to take your nausea medication as needed.   If you develop nausea and vomiting that is not controlled by your nausea medication, call the clinic.   BELOW ARE SYMPTOMS THAT SHOULD BE REPORTED IMMEDIATELY:  *FEVER GREATER THAN 100.5 F  *CHILLS WITH OR WITHOUT FEVER  NAUSEA AND VOMITING THAT IS NOT CONTROLLED WITH YOUR NAUSEA MEDICATION  *UNUSUAL SHORTNESS OF BREATH  *UNUSUAL BRUISING OR BLEEDING  TENDERNESS IN MOUTH AND THROAT WITH OR WITHOUT PRESENCE OF ULCERS  *URINARY PROBLEMS  *BOWEL PROBLEMS  UNUSUAL RASH Items with * indicate a potential emergency and should be followed up as soon as possible.  Feel free to call the clinic should you have any questions or concerns. The clinic phone number is [phone].  Please show the [place] at check-in to the Emergency Department and triage nurse.

## 2020-11-14 NOTE — Telephone Encounter (Signed)
Scheduled appointments per 1/11 los. Spoke to patient who is aware of appointments dates and times. Will have updated calendar printed for patient at next visit.

## 2020-11-16 ENCOUNTER — Other Ambulatory Visit: Payer: Self-pay

## 2020-11-16 ENCOUNTER — Inpatient Hospital Stay: Payer: Medicaid Other

## 2020-11-16 VITALS — BP 142/83 | HR 66 | Temp 98.0°F | Resp 18

## 2020-11-16 DIAGNOSIS — Z5111 Encounter for antineoplastic chemotherapy: Secondary | ICD-10-CM | POA: Diagnosis not present

## 2020-11-16 DIAGNOSIS — C20 Malignant neoplasm of rectum: Secondary | ICD-10-CM

## 2020-11-16 MED ORDER — HEPARIN SOD (PORK) LOCK FLUSH 100 UNIT/ML IV SOLN
500.0000 [IU] | Freq: Once | INTRAVENOUS | Status: AC | PRN
  Administered 2020-11-16: 500 [IU]
  Filled 2020-11-16: qty 5

## 2020-11-16 MED ORDER — SODIUM CHLORIDE 0.9% FLUSH
10.0000 mL | INTRAVENOUS | Status: DC | PRN
  Administered 2020-11-16: 10 mL
  Filled 2020-11-16: qty 10

## 2020-11-16 NOTE — Patient Instructions (Signed)

## 2020-11-21 ENCOUNTER — Encounter: Payer: Self-pay | Admitting: Oncology

## 2020-11-24 ENCOUNTER — Encounter: Payer: Self-pay | Admitting: Oncology

## 2020-11-25 ENCOUNTER — Other Ambulatory Visit: Payer: Self-pay | Admitting: Oncology

## 2020-11-27 ENCOUNTER — Inpatient Hospital Stay: Payer: Medicaid Other

## 2020-11-27 ENCOUNTER — Other Ambulatory Visit: Payer: Self-pay | Admitting: Nurse Practitioner

## 2020-11-27 ENCOUNTER — Other Ambulatory Visit: Payer: Self-pay

## 2020-11-27 ENCOUNTER — Inpatient Hospital Stay (HOSPITAL_BASED_OUTPATIENT_CLINIC_OR_DEPARTMENT_OTHER): Payer: Medicaid Other | Admitting: Nurse Practitioner

## 2020-11-27 ENCOUNTER — Encounter: Payer: Self-pay | Admitting: Nurse Practitioner

## 2020-11-27 VITALS — BP 103/80 | HR 76 | Temp 98.3°F | Resp 18 | Ht 68.0 in | Wt 199.7 lb

## 2020-11-27 DIAGNOSIS — Z5111 Encounter for antineoplastic chemotherapy: Secondary | ICD-10-CM | POA: Diagnosis not present

## 2020-11-27 DIAGNOSIS — C20 Malignant neoplasm of rectum: Secondary | ICD-10-CM

## 2020-11-27 LAB — CBC WITH DIFFERENTIAL (CANCER CENTER ONLY)
Abs Immature Granulocytes: 0.01 10*3/uL (ref 0.00–0.07)
Basophils Absolute: 0 10*3/uL (ref 0.0–0.1)
Basophils Relative: 1 %
Eosinophils Absolute: 0.2 10*3/uL (ref 0.0–0.5)
Eosinophils Relative: 3 %
HCT: 41.2 % (ref 39.0–52.0)
Hemoglobin: 13.2 g/dL (ref 13.0–17.0)
Immature Granulocytes: 0 %
Lymphocytes Relative: 35 %
Lymphs Abs: 1.5 10*3/uL (ref 0.7–4.0)
MCH: 28.4 pg (ref 26.0–34.0)
MCHC: 32 g/dL (ref 30.0–36.0)
MCV: 88.6 fL (ref 80.0–100.0)
Monocytes Absolute: 0.6 10*3/uL (ref 0.1–1.0)
Monocytes Relative: 13 %
Neutro Abs: 2.1 10*3/uL (ref 1.7–7.7)
Neutrophils Relative %: 48 %
Platelet Count: 256 10*3/uL (ref 150–400)
RBC: 4.65 MIL/uL (ref 4.22–5.81)
RDW: 14 % (ref 11.5–15.5)
WBC Count: 4.4 10*3/uL (ref 4.0–10.5)
nRBC: 0 % (ref 0.0–0.2)

## 2020-11-27 LAB — CMP (CANCER CENTER ONLY)
ALT: 21 U/L (ref 0–44)
AST: 15 U/L (ref 15–41)
Albumin: 3.7 g/dL (ref 3.5–5.0)
Alkaline Phosphatase: 75 U/L (ref 38–126)
Anion gap: 7 (ref 5–15)
BUN: 15 mg/dL (ref 6–20)
CO2: 23 mmol/L (ref 22–32)
Calcium: 9 mg/dL (ref 8.9–10.3)
Chloride: 111 mmol/L (ref 98–111)
Creatinine: 1.12 mg/dL (ref 0.61–1.24)
GFR, Estimated: >60 mL/min (ref >60)
Glucose, Bld: 97 mg/dL (ref 70–99)
Potassium: 4.9 mmol/L (ref 3.5–5.1)
Sodium: 141 mmol/L (ref 135–145)
Total Bilirubin: 0.5 mg/dL (ref 0.3–1.2)
Total Protein: 7 g/dL (ref 6.5–8.1)

## 2020-11-27 LAB — CEA (IN HOUSE-CHCC): CEA (CHCC-In House): 18.4 ng/mL — ABNORMAL HIGH (ref 0.00–5.00)

## 2020-11-27 MED ORDER — DEXAMETHASONE 4 MG PO TABS: 4.0000 mg | ORAL_TABLET | Freq: Two times a day (BID) | ORAL | 0 refills | Status: DC

## 2020-11-27 MED FILL — DEXAMETHASONE 4 MG TABLET: 4 | 12 days supply | Qty: 24 | Fill #0

## 2020-11-27 NOTE — Progress Notes (Signed)
[place] OFFICE PROGRESS NOTE   Diagnosis: Rectal cancer  INTERVAL HISTORY:   Bradley Colon returns as scheduled.  He completed cycle 2 FOLFOX 11/14/2020.  He has had mild persistent nausea since chemotherapy.  No vomiting.  He did not have enough of the dexamethasone to take as prescribed.  Zofran offered some relief.  Compazine was not effective.  Some diarrhea which he attributes to eating a salad.  He developed "cuts" underneath his dentures upper gumline.  Cold sensitivity lasted about 1 week.  No persistent neuropathy symptoms.  He reports feeling better yesterday/today and wonders if he could return for treatment later this week.  He reports feet are excessively dry.  He used a "sander" to remove the dry skin.  Objective:  Vital signs in last 24 hours:  Blood pressure 103/80, pulse 76, temperature 98.3 F (36.8 C), temperature source Tympanic, resp. rate 18, height '5\' 8"'  (1.727 m), weight 199 lb 11.2 oz (90.6 kg), SpO2 98 %.    HEENT: No thrush or ulcers. Resp: Lungs clear bilaterally. Cardio: Regular rate and rhythm. GI: No hepatomegaly. Vascular: No leg edema Skin: Significant dryness/skin thickening over the soles.  Palms with mild dryness. Port-A-Cath without erythema.  Lab Results:  Lab Results  Component Value Date   WBC 5.0 11/13/2020   HGB 12.7 (L) 11/13/2020   HCT 39.0 11/13/2020   MCV 88.8 11/13/2020   PLT 299 11/13/2020   NEUTROABS 2.2 11/13/2020    Imaging:  No results found.  Medications: I have reviewed the patient's current medications.  Assessment/Plan: 1. Rectal cancer, clinical stage III (T3cN2), Foundation 1 on lymph node 11/18/2019-tumor mutation burden 1, FG R1 amplification, K-ras wild-type, microsatellite status could not be determined ? Rectal mass extending from 4-9 cm on colonoscopy 02/15/2019 with a biopsy confirming invasive adenocarcinoma ? CTs 02/23/2019-thickening of the low rectum without a discrete mass, small bilateral  pulmonary nodules, some present on imaging from 2014 ? Pelvic MRI 02/25/2019-T3cN2 tumor beginning at 4.9 cm from the anal verge and 0 cm from the internal anal sphincter, tumor extends through the muscularis propria, 2. Rectal lymph nodes and 2 enlarged right internal iliac nodes ? [facility] at [facility] 04/19/2019-new subcentimeter right hepatic lesion concerning for metastasis, stable low rectal mass with prominent mesorectal lymph nodes, stable pathologic enlarged right internal iliac nodes, stable pulmonary nodules ? Xeloda/radiation beginning 04/26/2019, completed 07/05/2019 ? CT 07/26/2019-growth of previously described right hepatic lesion, slight decrease in pelvic lymphadenopathy and rectal thickening ? 08/05/2019-week 1 bolus 5-FU/leucovorin (200 mg/m) ? 08/12/2019-week to bolus 5-FU/leucovorin (400 mg/m) ? 10/16/ 2020-week 3 bolus 5-FU/leucovorin (400 mg/m) and oxaliplatin (42.5 mg/m) ? CT chest 10/04/2019-slight increase in bilateral pulmonary nodules concerning for metastases ? MRI abdomen 10/06/2019-right hepatic lesion consistent with metastasis, wall thickening of the low anterior rectum, no new or enlarging adenopathy, stable mesorectal and right internal iliac nodes ? PET scan 10/14/2019-pulmonary nodules below PET resolution, hypermetabolic right posterior hepatic lesion, mildly hypermetabolic partially calcified right internal iliac chain lymphadenopathy, mild gastric hypermetabolism and thickening-likely gastritis ? 11/18/2019-LAR with diverting loop ileostomy and ablation of solitary liver metastasis (2 cm segment 7), moderately differentiated adenocarcinoma invading into soft tissue, LVI present, perineural invasion present, 5/15 lymph nodes positive, 4 tumor deposits, absent treatment effect-score 3,ypT3 ypN2a  ? CT chest 01/16/2020-increase in the size of a dominant right lower lobe nodule, unchanged left upper lobe and left lower lobe nodules ? CT chest 04/11/2020-enlargement of bilateral lung  nodules, and new nodules ? CT chest 09/11/2020-enlargement  of bilateral lung nodules, aggressive medial right liver lesion ? Cycle 1 FOLFOX 10/31/2020 ? Cycle 2 FOLFOX 11/14/2020 2. Pain secondary to #1, improved 3. Reactive airways disease/asthma 4. Report of multiple allergens 5. Port-A-Cath related DVT, right IJ, 09/30/2019-apixaban, converted to Lovenox surrounding surgery January 2021, changed back to apixaban 12/30/2019 6. Erectile dysfunction following treatment for rectal cancer-followed by urology   Disposition: [name] appears stable.  He has completed 2 cycles of FOLFOX.  He had significant delayed nausea.  He did not have enough prophylactic Decadron to take as prescribed beginning day of pump discontinuation.  We will send a refill to his pharmacy.  We discussed adding Emend to the premedication regimen.  He declines this at present.  He also had mouth sores and has excessive dryness/skin thickening over the soles.  He started feeling better yesterday and would like to reschedule today's treatment to later in the week.  He will return for cycle 3 FOLFOX on 11/29/2020 pending adequate labs from today.  Will need to dose reduce the 5-fluorouracil due to mucositis and hand-foot syndrome.  He will return for lab, follow-up, cycle 4 FOLFOX on 12/12/2020.  We are available to see him sooner if needed.    Ned Card ANP/GNP-BC   11/27/2020  11:42 AM

## 2020-11-28 ENCOUNTER — Telehealth: Payer: Self-pay | Admitting: Nurse Practitioner

## 2020-11-28 ENCOUNTER — Other Ambulatory Visit: Payer: Self-pay | Admitting: Oncology

## 2020-11-28 NOTE — Telephone Encounter (Signed)
Scheduled appointments per 1/25 los. Spoke to patient who is aware of appointments dates and times.

## 2020-11-29 ENCOUNTER — Other Ambulatory Visit: Payer: Self-pay

## 2020-11-29 ENCOUNTER — Inpatient Hospital Stay: Payer: Medicaid Other

## 2020-11-29 ENCOUNTER — Other Ambulatory Visit: Payer: Self-pay | Admitting: Medical

## 2020-11-29 VITALS — BP 141/90 | HR 83 | Temp 98.2°F | Resp 18

## 2020-11-29 DIAGNOSIS — C20 Malignant neoplasm of rectum: Secondary | ICD-10-CM

## 2020-11-29 DIAGNOSIS — Z5111 Encounter for antineoplastic chemotherapy: Secondary | ICD-10-CM | POA: Diagnosis not present

## 2020-11-29 MED ORDER — FLUOROURACIL CHEMO INJECTION 5 GM/100ML
1600.0000 mg/m2 | INTRAVENOUS | Status: DC
  Administered 2020-11-29: 3400 mg via INTRAVENOUS
  Filled 2020-11-29: qty 68

## 2020-11-29 MED ORDER — LEUCOVORIN CALCIUM INJECTION 350 MG
300.0000 mg/m2 | Freq: Once | INTRAVENOUS | Status: AC
  Administered 2020-11-29: 636 mg via INTRAVENOUS
  Filled 2020-11-29: qty 31.8

## 2020-11-29 MED ORDER — SODIUM CHLORIDE 0.9 % IV SOLN
10.0000 mg | Freq: Once | INTRAVENOUS | Status: AC
  Administered 2020-11-29: 10 mg via INTRAVENOUS
  Filled 2020-11-29: qty 10
  Filled 2020-11-29: qty 1

## 2020-11-29 MED ORDER — TRIAMCINOLONE ACETONIDE 0.1 % EX LOTN: 1.0000 "application " | TOPICAL_LOTION | Freq: Three times a day (TID) | CUTANEOUS | 2 refills | Status: DC

## 2020-11-29 MED ORDER — PALONOSETRON HCL INJECTION 0.25 MG/5ML
0.2500 mg | Freq: Once | INTRAVENOUS | Status: AC
Start: 2020-11-29 — End: 2020-11-29
  Administered 2020-11-29: 0.25 mg via INTRAVENOUS

## 2020-11-29 MED ORDER — OXALIPLATIN CHEMO INJECTION 100 MG/20ML
85.0000 mg/m2 | Freq: Once | INTRAVENOUS | Status: AC
  Administered 2020-11-29: 180 mg via INTRAVENOUS
  Filled 2020-11-29: qty 36

## 2020-11-29 MED ORDER — DEXTROSE 5 % IV SOLN
Freq: Once | INTRAVENOUS | Status: AC
  Filled 2020-11-29: qty 250

## 2020-11-29 MED ORDER — PALONOSETRON HCL INJECTION 0.25 MG/5ML
INTRAVENOUS | Status: AC
  Filled 2020-11-29: qty 5

## 2020-11-29 MED FILL — TRIAMCINOLONE ACETONIDE 0.1: 0.1 | 30 days supply | Qty: 120 | Fill #0

## 2020-11-29 MED FILL — PROAIR HFA 90 MCG INHALER: 108 (90 BAS | 25 days supply | Qty: 9 | Fill #0

## 2020-11-29 MED FILL — LIDOCAINE-PRILOCAINE CREAM: 2.5-2.5 | 10 days supply | Qty: 30 | Fill #0

## 2020-11-29 MED FILL — ELIQUIS 5 MG TABLET: 5 | 30 days supply | Qty: 60 | Fill #0

## 2020-11-29 NOTE — Progress Notes (Signed)
Per Ned Card, NP, ok to disconnect 5FU pump early as needed on Saturday 1/29.

## 2020-11-29 NOTE — Patient Instructions (Signed)
[place] Discharge Instructions for Patients Receiving Chemotherapy  Today you received the following chemotherapy agents: oxaliplatin/leucovorin/fluorouracil.  To help prevent nausea and vomiting after your treatment, we encourage you to take your nausea medication as directed.  If you develop nausea and vomiting that is not controlled by your nausea medication, call the clinic.   BELOW ARE SYMPTOMS THAT SHOULD BE REPORTED IMMEDIATELY:  *FEVER GREATER THAN 100.5 F  *CHILLS WITH OR WITHOUT FEVER  NAUSEA AND VOMITING THAT IS NOT CONTROLLED WITH YOUR NAUSEA MEDICATION  *UNUSUAL SHORTNESS OF BREATH  *UNUSUAL BRUISING OR BLEEDING  TENDERNESS IN MOUTH AND THROAT WITH OR WITHOUT PRESENCE OF ULCERS  *URINARY PROBLEMS  *BOWEL PROBLEMS  UNUSUAL RASH Items with * indicate a potential emergency and should be followed up as soon as possible.  Feel free to call the clinic should you have any questions or concerns. The clinic phone number is [phone].  Please show the [place] at check-in to the Emergency Department and triage nurse.

## 2020-12-01 ENCOUNTER — Inpatient Hospital Stay: Payer: Medicaid Other

## 2020-12-01 ENCOUNTER — Other Ambulatory Visit: Payer: Self-pay

## 2020-12-01 VITALS — BP 126/91 | HR 74 | Temp 97.8°F | Resp 17

## 2020-12-01 DIAGNOSIS — C20 Malignant neoplasm of rectum: Secondary | ICD-10-CM

## 2020-12-01 DIAGNOSIS — Z5111 Encounter for antineoplastic chemotherapy: Secondary | ICD-10-CM | POA: Diagnosis not present

## 2020-12-01 MED ORDER — HEPARIN SOD (PORK) LOCK FLUSH 100 UNIT/ML IV SOLN
500.0000 [IU] | Freq: Once | INTRAVENOUS | Status: AC | PRN
  Administered 2020-12-01: 500 [IU]
  Filled 2020-12-01: qty 5

## 2020-12-01 MED ORDER — SODIUM CHLORIDE 0.9% FLUSH
10.0000 mL | INTRAVENOUS | Status: DC | PRN
  Administered 2020-12-01: 10 mL
  Filled 2020-12-01: qty 10

## 2020-12-03 ENCOUNTER — Other Ambulatory Visit: Payer: Self-pay | Admitting: Oncology

## 2020-12-03 ENCOUNTER — Other Ambulatory Visit: Payer: Self-pay

## 2020-12-03 MED ORDER — ONDANSETRON HCL 8 MG PO TABS: 8.0000 mg | ORAL_TABLET | Freq: Three times a day (TID) | ORAL | 1 refills | Status: DC | PRN

## 2020-12-03 MED FILL — ONDANSETRON HCL 8 MG TABLET: 8 | 10 days supply | Qty: 30 | Fill #0

## 2020-12-05 ENCOUNTER — Encounter: Payer: Self-pay | Admitting: Oncology

## 2020-12-08 ENCOUNTER — Other Ambulatory Visit: Payer: Self-pay | Admitting: Oncology

## 2020-12-10 ENCOUNTER — Other Ambulatory Visit: Payer: Self-pay | Admitting: *Deleted

## 2020-12-10 DIAGNOSIS — C20 Malignant neoplasm of rectum: Secondary | ICD-10-CM

## 2020-12-11 MED FILL — Dexamethasone Sodium Phosphate Inj 100 MG/10ML: INTRAMUSCULAR | Qty: 1 | Status: AC

## 2020-12-12 ENCOUNTER — Other Ambulatory Visit: Payer: Self-pay

## 2020-12-12 ENCOUNTER — Inpatient Hospital Stay (HOSPITAL_BASED_OUTPATIENT_CLINIC_OR_DEPARTMENT_OTHER): Payer: Medicaid Other | Admitting: Oncology

## 2020-12-12 ENCOUNTER — Inpatient Hospital Stay: Payer: Medicaid Other

## 2020-12-12 ENCOUNTER — Inpatient Hospital Stay: Payer: Medicaid Other | Attending: Oncology

## 2020-12-12 ENCOUNTER — Other Ambulatory Visit: Payer: Medicaid Other

## 2020-12-12 ENCOUNTER — Ambulatory Visit: Payer: Medicaid Other

## 2020-12-12 ENCOUNTER — Ambulatory Visit: Payer: Medicaid Other | Admitting: Oncology

## 2020-12-12 VITALS — BP 134/88 | HR 65 | Temp 98.1°F | Resp 18 | Ht 68.0 in | Wt 208.6 lb

## 2020-12-12 DIAGNOSIS — Z95828 Presence of other vascular implants and grafts: Secondary | ICD-10-CM

## 2020-12-12 DIAGNOSIS — C20 Malignant neoplasm of rectum: Secondary | ICD-10-CM | POA: Insufficient documentation

## 2020-12-12 DIAGNOSIS — G893 Neoplasm related pain (acute) (chronic): Secondary | ICD-10-CM | POA: Diagnosis not present

## 2020-12-12 DIAGNOSIS — C787 Secondary malignant neoplasm of liver and intrahepatic bile duct: Secondary | ICD-10-CM | POA: Diagnosis not present

## 2020-12-12 DIAGNOSIS — Z452 Encounter for adjustment and management of vascular access device: Secondary | ICD-10-CM | POA: Insufficient documentation

## 2020-12-12 LAB — CBC WITH DIFFERENTIAL (CANCER CENTER ONLY)
Abs Immature Granulocytes: 0.01 10*3/uL (ref 0.00–0.07)
Basophils Absolute: 0 10*3/uL (ref 0.0–0.1)
Basophils Relative: 1 %
Eosinophils Absolute: 0.1 10*3/uL (ref 0.0–0.5)
Eosinophils Relative: 3 %
HCT: 37.6 % — ABNORMAL LOW (ref 39.0–52.0)
Hemoglobin: 12.3 g/dL — ABNORMAL LOW (ref 13.0–17.0)
Immature Granulocytes: 0 %
Lymphocytes Relative: 28 %
Lymphs Abs: 1.2 10*3/uL (ref 0.7–4.0)
MCH: 29.1 pg (ref 26.0–34.0)
MCHC: 32.7 g/dL (ref 30.0–36.0)
MCV: 88.9 fL (ref 80.0–100.0)
Monocytes Absolute: 0.4 10*3/uL (ref 0.1–1.0)
Monocytes Relative: 10 %
Neutro Abs: 2.4 10*3/uL (ref 1.7–7.7)
Neutrophils Relative %: 58 %
Platelet Count: 265 10*3/uL (ref 150–400)
RBC: 4.23 MIL/uL (ref 4.22–5.81)
RDW: 14.8 % (ref 11.5–15.5)
WBC Count: 4.2 10*3/uL (ref 4.0–10.5)
nRBC: 0 % (ref 0.0–0.2)

## 2020-12-12 LAB — CMP (CANCER CENTER ONLY)
ALT: 21 U/L (ref 0–44)
AST: 16 U/L (ref 15–41)
Albumin: 3.6 g/dL (ref 3.5–5.0)
Alkaline Phosphatase: 77 U/L (ref 38–126)
Anion gap: 6 (ref 5–15)
BUN: 16 mg/dL (ref 6–20)
CO2: 24 mmol/L (ref 22–32)
Calcium: 8.9 mg/dL (ref 8.9–10.3)
Chloride: 108 mmol/L (ref 98–111)
Creatinine: 1.07 mg/dL (ref 0.61–1.24)
GFR, Estimated: >60 mL/min (ref >60)
Glucose, Bld: 101 mg/dL — ABNORMAL HIGH (ref 70–99)
Potassium: 4.6 mmol/L (ref 3.5–5.1)
Sodium: 138 mmol/L (ref 135–145)
Total Bilirubin: 0.6 mg/dL (ref 0.3–1.2)
Total Protein: 6.7 g/dL (ref 6.5–8.1)

## 2020-12-12 LAB — CEA (IN HOUSE-CHCC): CEA (CHCC-In House): 12.84 ng/mL — ABNORMAL HIGH (ref 0.00–5.00)

## 2020-12-12 MED ORDER — SODIUM CHLORIDE 0.9% FLUSH
10.0000 mL | INTRAVENOUS | Status: DC | PRN
  Administered 2020-12-12: 10 mL via INTRAVENOUS
  Filled 2020-12-12: qty 10

## 2020-12-12 MED ORDER — SODIUM CHLORIDE 0.9% FLUSH
10.0000 mL | Freq: Once | INTRAVENOUS | Status: AC
  Administered 2020-12-12: 10 mL
  Filled 2020-12-12: qty 10

## 2020-12-12 MED ORDER — HEPARIN SOD (PORK) LOCK FLUSH 100 UNIT/ML IV SOLN
500.0000 [IU] | Freq: Once | INTRAVENOUS | Status: AC
  Administered 2020-12-12: 500 [IU] via INTRAVENOUS
  Filled 2020-12-12: qty 5

## 2020-12-12 NOTE — Progress Notes (Signed)
Bradley Colon   Diagnosis: Rectal cancer  INTERVAL HISTORY:   Bradley Colon completed another cycle of FOLFOX on 11/29/2020.  He reports profound malaise following this cycle of chemotherapy.  No nausea/vomiting or diarrhea.  He had prolonged cold sensitivity and peripheral tingling.  He has fecal incontinence.  Perineal pain has resolved.  He continues to have erectile dysfunction.  Bradley Colon reports persistent malaise.  He does not wish to have chemotherapy today.  He reports discomfort at the right axilla and posterior chest wall with certain movements.  He complains of a lack of family support.  He wants the chemotherapy to be completed so that he can resume his normal activities.  Objective:  Vital signs in last 24 hours:  Blood pressure 134/88, pulse 65, temperature 98.1 F (36.7 C), temperature source Tympanic, resp. rate 18, height '5\' 8"'  (1.727 m), weight 208 lb 9.6 oz (94.6 kg), SpO2 96 %.    HEENT: No thrush or ulcers Resp: Lungs clear bilaterally Cardio: Regular rate and rhythm GI: No hepatomegaly, nontender Vascular: No leg edema Musculoskeletal: No mass or tenderness at the right chest wall.  No rash.  Portacath/PICC-without erythema  Lab Results:  Lab Results  Component Value Date   WBC 4.2 12/12/2020   HGB 12.3 (L) 12/12/2020   HCT 37.6 (L) 12/12/2020   MCV 88.9 12/12/2020   PLT 265 12/12/2020   NEUTROABS 2.4 12/12/2020    CMP  Lab Results  Component Value Date   NA 138 12/12/2020   K 4.6 12/12/2020   CL 108 12/12/2020   CO2 24 12/12/2020   GLUCOSE 101 (H) 12/12/2020   BUN 16 12/12/2020   CREATININE 1.07 12/12/2020   CALCIUM 8.9 12/12/2020   PROT 6.7 12/12/2020   ALBUMIN 3.6 12/12/2020   AST 16 12/12/2020   ALT 21 12/12/2020   ALKPHOS 77 12/12/2020   BILITOT 0.6 12/12/2020   GFRNONAA >60 12/12/2020   GFRAA >60 01/02/2020    Lab Results  Component Value Date   CEA1 18.40 (H) 11/27/2020     Medications: I have  reviewed the patient's current medications.   Assessment/Plan:  1. Rectal cancer, clinical stage III (T3cN2), Foundation 1 on lymph node 11/18/2019-tumor mutation burden 1, FG R1 amplification, K-ras wild-type, microsatellite status could not be determined ? Rectal mass extending from 4-9 cm on colonoscopy 02/15/2019 with a biopsy confirming invasive adenocarcinoma ? CTs 02/23/2019-thickening of the low rectum without a discrete mass, small bilateral pulmonary nodules, some present on imaging from 2014 ? Pelvic MRI 02/25/2019-T3cN2 tumor beginning at 4.9 cm from the anal verge and 0 cm from the internal anal sphincter, tumor extends through the muscularis propria, 2. Rectal lymph nodes and 2 enlarged right internal iliac nodes ? CTs at [facility] 04/19/2019-new subcentimeter right hepatic lesion concerning for metastasis, stable low rectal mass with prominent mesorectal lymph nodes, stable pathologic enlarged right internal iliac nodes, stable pulmonary nodules ? Xeloda/radiation beginning 04/26/2019, completed 07/05/2019 ? CT 07/26/2019-growth of previously described right hepatic lesion, slight decrease in pelvic lymphadenopathy and rectal thickening ? 08/05/2019-week 1 bolus 5-FU/leucovorin (200 mg/m) ? 08/12/2019-week to bolus 5-FU/leucovorin (400 mg/m) ? 10/16/ 2020-week 3 bolus 5-FU/leucovorin (400 mg/m) and oxaliplatin (42.5 mg/m) ? CT chest 10/04/2019-slight increase in bilateral pulmonary nodules concerning for metastases ? MRI abdomen 10/06/2019-right hepatic lesion consistent with metastasis, wall thickening of the low anterior rectum, no new or enlarging adenopathy, stable mesorectal and right internal iliac nodes ? PET scan 10/14/2019-pulmonary nodules below PET resolution,  hypermetabolic right posterior hepatic lesion, mildly hypermetabolic partially calcified right internal iliac chain lymphadenopathy, mild gastric hypermetabolism and thickening-likely gastritis ? 11/18/2019-LAR with diverting  loop ileostomy and ablation of solitary liver metastasis (2 cm segment 7), moderately differentiated adenocarcinoma invading into soft tissue, LVI present, perineural invasion present, 5/15 lymph nodes positive, 4 tumor deposits, absent treatment effect-score 3,ypT3 ypN2a  ? CT chest 01/16/2020-increase in the size of a dominant right lower lobe nodule, unchanged left upper lobe and left lower lobe nodules ? CT chest 04/11/2020-enlargement of bilateral lung nodules, and new nodules ? CT chest 09/11/2020-enlargement of bilateral lung nodules, aggressive medial right liver lesion ? Cycle 1 FOLFOX 10/31/2020 ? Cycle 2 FOLFOX 11/14/2020 ? Cycle 3 FOLFOX 11/29/2020 2. Pain secondary to #1, improved 3. Reactive airways disease/asthma 4. Report of multiple allergens 5. Port-A-Cath related DVT, right IJ, 09/30/2019-apixaban, converted to Lovenox surrounding surgery January 2021, changed back to apixaban 12/30/2019 6. Erectile dysfunction following treatment for rectal cancer-followed by urology    Disposition: Bradley Colon has metastatic rectal cancer.  He has completed 3 cycles of FOLFOX.  He appears to be tolerating the chemotherapy well.  He reports increased malaise following the most recent cycle of chemotherapy.  He does not wish to receive chemotherapy today.  He agrees to a follow-up visit and will consider completing cycle 4 FOLFOX in 1 week.  I explained the diagnosis of metastatic rectal cancer.  He understands no therapy will be curative.  I recommend continuing chemotherapy for 5 cycles prior to a restaging CT evaluation.  I offered him an appointment with the Cancer center counselor/social work to discuss his home situation.  He declined.  He is scheduled for follow-up at [facility] later this week.  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  He is participating in pelvic physical therapy for the incontinence.  Betsy Coder, MD  12/12/2020  12:11 PM

## 2020-12-12 NOTE — Patient Instructions (Signed)
Implanted Port Insertion, Care After This sheet gives you information about how to care for yourself after your procedure. Your health care provider may also give you more specific instructions. If you have problems or questions, contact your health care provider. What can I expect after the procedure? After the procedure, it is common to have:  Discomfort at the port insertion site.  Bruising on the skin over the port. This should improve over 3-4 days. Follow these instructions at home: Port care  After your port is placed, you will get a manufacturer's information card. The card has information about your port. Keep this card with you at all times.  Take care of the port as told by your health care provider. Ask your health care provider if you or a family member can get training for taking care of the port at home. A home health care nurse may also take care of the port.  Make sure to remember what type of port you have. Incision care  Follow instructions from your health care provider about how to take care of your port insertion site. Make sure you: ? Wash your hands with soap and water before and after you change your bandage (dressing). If soap and water are not available, use hand sanitizer. ? Change your dressing as told by your health care provider. ? Leave stitches (sutures), skin glue, or adhesive strips in place. These skin closures may need to stay in place for 2 weeks or longer. If adhesive strip edges start to loosen and curl up, you may trim the loose edges. Do not remove adhesive strips completely unless your health care provider tells you to do that.  Check your port insertion site every day for signs of infection. Check for: ? Redness, swelling, or pain. ? Fluid or blood. ? Warmth. ? Pus or a bad smell.      Activity  Return to your normal activities as told by your health care provider. Ask your health care provider what activities are safe for you.  Do not  lift anything that is heavier than 10 lb (4.5 kg), or the limit that you are told, until your health care provider says that it is safe. General instructions  Take over-the-counter and prescription medicines only as told by your health care provider.  Do not take baths, swim, or use a hot tub until your health care provider approves. Ask your health care provider if you may take showers. You may only be allowed to take sponge baths.  Do not drive for 24 hours if you were given a sedative during your procedure.  Wear a medical alert bracelet in case of an emergency. This will tell any health care providers that you have a port.  Keep all follow-up visits as told by your health care provider. This is important. Contact a health care provider if:  You cannot flush your port with saline as directed, or you cannot draw blood from the port.  You have a fever or chills.  You have redness, swelling, or pain around your port insertion site.  You have fluid or blood coming from your port insertion site.  Your port insertion site feels warm to the touch.  You have pus or a bad smell coming from the port insertion site. Get help right away if:  You have chest pain or shortness of breath.  You have bleeding from your port that you cannot control. Summary  Take care of the port as told by your   health care provider. Keep the manufacturer's information card with you at all times.  Change your dressing as told by your health care provider.  Contact a health care provider if you have a fever or chills or if you have redness, swelling, or pain around your port insertion site.  Keep all follow-up visits as told by your health care provider. This information is not intended to replace advice given to you by your health care provider. Make sure you discuss any questions you have with your health care provider. Document Revised: 05/18/2018 Document Reviewed: 05/18/2018 Elsevier Patient Education   2021 [employer] Inc.  

## 2020-12-13 ENCOUNTER — Telehealth: Payer: Self-pay | Admitting: Oncology

## 2020-12-13 NOTE — Telephone Encounter (Signed)
Scheduled appointments per 2/9 los. Called patient, no answer. Left message with appointments date and times.

## 2020-12-14 ENCOUNTER — Inpatient Hospital Stay: Payer: Medicaid Other

## 2020-12-18 ENCOUNTER — Other Ambulatory Visit: Payer: Self-pay | Admitting: *Deleted

## 2020-12-18 DIAGNOSIS — C20 Malignant neoplasm of rectum: Secondary | ICD-10-CM

## 2020-12-18 MED FILL — Dexamethasone Sodium Phosphate Inj 100 MG/10ML: INTRAMUSCULAR | Qty: 1 | Status: AC

## 2020-12-19 ENCOUNTER — Telehealth: Payer: Self-pay | Admitting: *Deleted

## 2020-12-19 ENCOUNTER — Encounter: Payer: Self-pay | Admitting: Oncology

## 2020-12-19 ENCOUNTER — Inpatient Hospital Stay: Payer: Medicaid Other | Admitting: Oncology

## 2020-12-19 ENCOUNTER — Encounter: Payer: Self-pay | Admitting: *Deleted

## 2020-12-19 ENCOUNTER — Inpatient Hospital Stay: Payer: Medicaid Other

## 2020-12-19 NOTE — Telephone Encounter (Signed)
Daughter, Claudine Mouton called asking to discuss his diagnosis and treatment and his missed appointments. Informed her that she is not listed on his ROI, so I am not able to discuss his care with her.  Called patient and he gave permission to speak w/his daughter. He agrees to come on 2/23 as scheduled and if he feels better he may agree to the treatment that day. He said his mother provided her access to his MyChart. Informed that she should have not done that, but he said "that's OK".

## 2020-12-20 ENCOUNTER — Telehealth: Payer: Self-pay | Admitting: *Deleted

## 2020-12-20 NOTE — Telephone Encounter (Signed)
Spoke w/daughter in [place], Oni and talked about his current chemotherapy and that the [facility] regimen was the first time he has had this. Plan for 5 cycle and then re-scan. In past he had the 5FU infusions. Explained that the cancer has spread to liver and his lungs and the chemo will reach these tumors as well, but it is more effective if given every 2 weeks. She is asking if surgery to remove the lung/liver tumors is an option--informed her no, there are too many. She reports he needs stronger dose of xanax--has longstanding anxiety and depression and his current living situation is not helping--he is getting no support in home. She is not able to come to visit (in nursing school), but agrees to be on speaker phone during his visit on 2/23.

## 2020-12-21 ENCOUNTER — Inpatient Hospital Stay: Payer: Medicaid Other

## 2020-12-24 ENCOUNTER — Encounter: Payer: Self-pay | Admitting: Oncology

## 2020-12-25 MED FILL — Dexamethasone Sodium Phosphate Inj 100 MG/10ML: INTRAMUSCULAR | Qty: 1 | Status: AC

## 2020-12-26 ENCOUNTER — Inpatient Hospital Stay: Payer: Medicaid Other | Admitting: Oncology

## 2020-12-26 ENCOUNTER — Inpatient Hospital Stay: Payer: Medicaid Other

## 2020-12-26 ENCOUNTER — Encounter: Payer: Self-pay | Admitting: Oncology

## 2020-12-28 ENCOUNTER — Inpatient Hospital Stay: Payer: Medicaid Other

## 2020-12-31 ENCOUNTER — Inpatient Hospital Stay: Payer: Medicaid Other | Admitting: Oncology

## 2020-12-31 ENCOUNTER — Telehealth: Payer: Self-pay | Admitting: Oncology

## 2020-12-31 ENCOUNTER — Inpatient Hospital Stay (HOSPITAL_BASED_OUTPATIENT_CLINIC_OR_DEPARTMENT_OTHER): Payer: Medicaid Other | Admitting: Oncology

## 2020-12-31 VITALS — BP 126/78 | HR 80 | Temp 97.8°F | Resp 20 | Ht 68.0 in | Wt 207.3 lb

## 2020-12-31 DIAGNOSIS — C20 Malignant neoplasm of rectum: Secondary | ICD-10-CM

## 2020-12-31 NOTE — Progress Notes (Signed)
Patient declined to allow RN to put his daughter, Claudine Mouton on the speaker phone during visit today. Informed him she has a lot of questions she wants to ask Dr. Benay Spice. Patient said "I will explain everything to her".

## 2020-12-31 NOTE — Progress Notes (Signed)
[place] OFFICE PROGRESS NOTE   Diagnosis: Rectal cancer  INTERVAL HISTORY:   Bradley Colon returns after missing a scheduled visit last week.  He has multiple complaints including fecal incontinence, erectile dysfunction, and difficulty with his living situation.  He reports that he felt there was an endpoint to chemotherapy after completing 5-6 cycles of FOLFOX.  He is questioning whether the lung nodules can be removed, ablated, or biopsied.  Objective:  Vital signs in last 24 hours:  Blood pressure 126/78, pulse 80, temperature 97.8 F (36.6 C), temperature source Tympanic, resp. rate 20, height '5\' 8"'  (1.727 m), weight 207 lb 4.8 oz (94 kg), SpO2 100 %.    Resp: Lungs clear bilaterally Cardio: Regular rate and rhythm GI: No hepatosplenomegaly, no mass, nontender Vascular: No leg edema  Portacath/PICC-without erythema  Lab Results:  Lab Results  Component Value Date   WBC 4.2 12/12/2020   HGB 12.3 (L) 12/12/2020   HCT 37.6 (L) 12/12/2020   MCV 88.9 12/12/2020   PLT 265 12/12/2020   NEUTROABS 2.4 12/12/2020    CMP  Lab Results  Component Value Date   NA 138 12/12/2020   K 4.6 12/12/2020   CL 108 12/12/2020   CO2 24 12/12/2020   GLUCOSE 101 (H) 12/12/2020   BUN 16 12/12/2020   CREATININE 1.07 12/12/2020   CALCIUM 8.9 12/12/2020   PROT 6.7 12/12/2020   ALBUMIN 3.6 12/12/2020   AST 16 12/12/2020   ALT 21 12/12/2020   ALKPHOS 77 12/12/2020   BILITOT 0.6 12/12/2020   GFRNONAA >60 12/12/2020   GFRAA >60 01/02/2020    Lab Results  Component Value Date   CEA1 12.84 (H) 12/12/2020    No results found for: INR  Imaging:  No results found.  Medications: I have reviewed the patient's current medications.   Assessment/Plan:  1. Rectal cancer, clinical stage III (T3cN2), Foundation 1 on lymph node 11/18/2019-tumor mutation burden 1, FG R1 amplification, K-ras wild-type, microsatellite status could not be determined ? Rectal mass extending  from 4-9 cm on colonoscopy 02/15/2019 with a biopsy confirming invasive adenocarcinoma ? CTs 02/23/2019-thickening of the low rectum without a discrete mass, small bilateral pulmonary nodules, some present on imaging from 2014 ? Pelvic MRI 02/25/2019-T3cN2 tumor beginning at 4.9 cm from the anal verge and 0 cm from the internal anal sphincter, tumor extends through the muscularis propria, 2. Rectal lymph nodes and 2 enlarged right internal iliac nodes ? CTs at [facility] 04/19/2019-new subcentimeter right hepatic lesion concerning for metastasis, stable low rectal mass with prominent mesorectal lymph nodes, stable pathologic enlarged right internal iliac nodes, stable pulmonary nodules ? Xeloda/radiation beginning 04/26/2019, completed 07/05/2019 ? CT 07/26/2019-growth of previously described right hepatic lesion, slight decrease in pelvic lymphadenopathy and rectal thickening ? 08/05/2019-week 1 bolus 5-FU/leucovorin (200 mg/m) ? 08/12/2019-week to bolus 5-FU/leucovorin (400 mg/m) ? 10/16/ 2020-week 3 bolus 5-FU/leucovorin (400 mg/m) and oxaliplatin (42.5 mg/m) ? CT chest 10/04/2019-slight increase in bilateral pulmonary nodules concerning for metastases ? MRI abdomen 10/06/2019-right hepatic lesion consistent with metastasis, wall thickening of the low anterior rectum, no new or enlarging adenopathy, stable mesorectal and right internal iliac nodes ? PET scan 10/14/2019-pulmonary nodules below PET resolution, hypermetabolic right posterior hepatic lesion, mildly hypermetabolic partially calcified right internal iliac chain lymphadenopathy, mild gastric hypermetabolism and thickening-likely gastritis ? 11/18/2019-LAR with diverting loop ileostomy and ablation of solitary liver metastasis (2 cm segment 7), moderately differentiated adenocarcinoma invading into soft tissue, LVI present, perineural invasion present, 5/15 lymph nodes positive,  4 tumor deposits, absent treatment effect-score 3,ypT3 ypN2a, mismatch  repair protein expression intact, MSS ? CT chest 01/16/2020-increase in the size of a dominant right lower lobe nodule, unchanged left upper lobe and left lower lobe nodules ? CT chest 04/11/2020-enlargement of bilateral lung nodules, and new nodules ? CT chest 09/11/2020-enlargement of bilateral lung nodules, aggressive medial right liver lesion ? Cycle 1 FOLFOX 10/31/2020 ? Cycle 2 FOLFOX 11/14/2020 ? Cycle 3 FOLFOX 11/29/2020 2. Pain secondary to #1, improved 3. Reactive airways disease/asthma 4. Report of multiple allergens 5. Port-A-Cath related DVT, right IJ, 09/30/2019-apixaban, converted to Lovenox surrounding surgery January 2021, changed back to apixaban 12/30/2019 6. Erectile dysfunction following treatment for rectal cancer-followed by urology    Disposition: Bradley Colon has rectal cancer.  He has a history of enlarging lung nodules and a liver metastasis.  I reviewed the most recent CT images with him again today.  I discussed the prognosis and treatment options.  I explained no therapy will be curative.  The goal of chemotherapy is to palliate symptoms, slow the grade of tumor growth, and extend survival.  We discussed the usual plan to complete 5-6 cycles of chemotherapy prior to a restaging evaluation and then decide on continuing FOLFOX or switching to a different systemic therapy regimen.  He does not appear to be a candidate for immunotherapy based on the microsatellite stable phenotype.  We will follow up on Foundation 1 testing from [facility].  Bradley Colon would like to proceed with a restaging CT now.  He will be scheduled for a chest CT prior to a follow-up visit 01/04/2021.  He is not a candidate for ablation, SBRT, or surgical remover of the lung lesions.  He appears to have numerous bilateral small pulmonary nodules.  Betsy Coder, MD  12/31/2020  1:50 PM

## 2020-12-31 NOTE — Telephone Encounter (Signed)
Scheduled appointment per 02/28 los. Patient is aware.

## 2021-01-01 ENCOUNTER — Telehealth: Payer: Self-pay | Admitting: *Deleted

## 2021-01-01 NOTE — Telephone Encounter (Signed)
Moved CT scan to 01/03/21 at 3:30 pm and patient aware. Will cancel if PA is still not done.

## 2021-01-01 NOTE — Telephone Encounter (Signed)
Spoke w/triage nurse for Dr. Rigoberto Noel at [facility] and requested she f/u to see if [facility] One testing has been done on this patient and to fax report if done. If not, are they willing to submit the tissue for [facility] One testing? She will call back.

## 2021-01-02 ENCOUNTER — Ambulatory Visit (HOSPITAL_COMMUNITY): Payer: Medicaid Other

## 2021-01-02 ENCOUNTER — Ambulatory Visit: Payer: Medicaid Other | Admitting: Oncology

## 2021-01-02 NOTE — Telephone Encounter (Signed)
Per [facility] One testing has not been sent on his rectal tissue: Faxed copy of path report dated 11/18/19 case #[id number]. Emailed [facility] requesting assistance in getting this case sent to [facility] for testing. Dx: C20 Stage IV

## 2021-01-03 ENCOUNTER — Other Ambulatory Visit: Payer: Self-pay

## 2021-01-03 ENCOUNTER — Ambulatory Visit (HOSPITAL_COMMUNITY)
Admission: RE | Admit: 2021-01-03 | Discharge: 2021-01-03 | Disposition: A | Discharge status: Home / Self Care | Payer: Medicaid Other | Source: Ambulatory Visit | Attending: Oncology | Admitting: Oncology

## 2021-01-03 DIAGNOSIS — C20 Malignant neoplasm of rectum: Secondary | ICD-10-CM | POA: Diagnosis not present

## 2021-01-04 ENCOUNTER — Inpatient Hospital Stay: Payer: Medicaid Other | Attending: Oncology | Admitting: Oncology

## 2021-01-04 ENCOUNTER — Inpatient Hospital Stay: Payer: Medicaid Other | Admitting: Oncology

## 2021-01-04 VITALS — BP 126/87 | HR 87 | Temp 97.8°F | Resp 20 | Ht 68.0 in | Wt 208.3 lb

## 2021-01-04 DIAGNOSIS — Z452 Encounter for adjustment and management of vascular access device: Secondary | ICD-10-CM | POA: Insufficient documentation

## 2021-01-04 DIAGNOSIS — C20 Malignant neoplasm of rectum: Secondary | ICD-10-CM | POA: Insufficient documentation

## 2021-01-04 DIAGNOSIS — Z5111 Encounter for antineoplastic chemotherapy: Secondary | ICD-10-CM | POA: Diagnosis not present

## 2021-01-04 NOTE — Progress Notes (Signed)
[place] OFFICE PROGRESS NOTE   Diagnosis: Rectal cancer  INTERVAL HISTORY:   Bradley Colon returns as scheduled.  He continues to have fecal incontinence, discomfort at the perineum, and erectile dysfunction.  No new complaint.  He plans to resume physical therapy and "biofeedback ".  Objective:  Vital signs in last 24 hours:  Blood pressure 126/87, pulse 87, temperature 97.8 F (36.6 C), temperature source Tympanic, resp. rate 20, height '5\' 8"'  (1.727 m), weight 208 lb 4.8 oz (94.5 kg), SpO2 98 %.    Physical examination-not performed today  Lab Results:  Lab Results  Component Value Date   WBC 4.2 12/12/2020   HGB 12.3 (L) 12/12/2020   HCT 37.6 (L) 12/12/2020   MCV 88.9 12/12/2020   PLT 265 12/12/2020   NEUTROABS 2.4 12/12/2020    CMP  Lab Results  Component Value Date   NA 138 12/12/2020   K 4.6 12/12/2020   CL 108 12/12/2020   CO2 24 12/12/2020   GLUCOSE 101 (H) 12/12/2020   BUN 16 12/12/2020   CREATININE 1.07 12/12/2020   CALCIUM 8.9 12/12/2020   PROT 6.7 12/12/2020   ALBUMIN 3.6 12/12/2020   AST 16 12/12/2020   ALT 21 12/12/2020   ALKPHOS 77 12/12/2020   BILITOT 0.6 12/12/2020   GFRNONAA >60 12/12/2020   GFRAA >60 01/02/2020    Lab Results  Component Value Date   CEA1 12.84 (H) 12/12/2020    Imaging:  CT CHEST WO CONTRAST  Result Date: 01/04/2021 CLINICAL DATA:  Colorectal cancer. EXAM: CT CHEST WITHOUT CONTRAST TECHNIQUE: Multidetector CT imaging of the chest was performed following the standard protocol without IV contrast. COMPARISON:  09/11/2020 FINDINGS: Cardiovascular: The heart size is normal. No substantial pericardial effusion. Atherosclerotic calcification is noted in the wall of the thoracic aorta. Right Port-A-Cath tip is positioned in the distal SVC near the junction with the RA. Mediastinum/Nodes: No mediastinal lymphadenopathy. No evidence for gross hilar lymphadenopathy although assessment is limited by the lack of  intravenous contrast on today's study. The esophagus has normal imaging features. There is no axillary lymphadenopathy. Lungs/Pleura: Multiple bilateral pulmonary nodules are again identified. Index medial left upper lobe nodule measured previously at 9 mm is 9 mm again today on image 39/5. Index left upper lobe lesion on 72/5 is 4 mm today, unchanged from 4-5 mm previously. Left lower lobe index lesion measured previously at 9 mm is 8 mm today on 88/5. Dominant index lesion measured previously in the right lower lobe at 1.4 x 1.3 cm measures 1.1 x 1.1 cm today. No new or progressive pulmonary nodule or mass. No focal airspace consolidation. There is no evidence of pleural effusion. Upper Abdomen: Index lesion in the posterior hepatic dome measured previously at 4.4 x 4.2 cm now measures 4.0 x 3.7 cm. Musculoskeletal: No worrisome lytic or sclerotic osseous abnormality. IMPRESSION: 1. Numerous bilateral pulmonary nodules are stable to minimally decreased in the interval. While quantitatively, these nodules measure similar to slightly smaller in size, qualitatively the margins of these nodules are more irregular and concave in appearance as opposed to more consistently convex margins previously, suggesting that response may be more significant than reflected in the long axis measurement. 2. Interval decrease in size of the posterior hepatic dome metastatic lesion, now measuring 4.0 cm maximum dimension. 3. Aortic Atherosclerosis (ICD10-I70.0). Electronically Signed   By: Misty Stanley M.D.   On: 01/04/2021 11:56    Medications: I have reviewed the patient's current medications.   Assessment/Plan:  1. Rectal cancer, clinical stage III (T3cN2), Foundation 1 on lymph node 11/18/2019-tumor mutation burden 1, FG R1 amplification, K-ras wild-type, microsatellite status could not be determined ? Rectal mass extending from 4-9 cm on colonoscopy 02/15/2019 with a biopsy confirming invasive adenocarcinoma ? CTs  02/23/2019-thickening of the low rectum without a discrete mass, small bilateral pulmonary nodules, some present on imaging from 2014 ? Pelvic MRI 02/25/2019-T3cN2 tumor beginning at 4.9 cm from the anal verge and 0 cm from the internal anal sphincter, tumor extends through the muscularis propria, 2. Rectal lymph nodes and 2 enlarged right internal iliac nodes ? CTs at [facility] 04/19/2019-new subcentimeter right hepatic lesion concerning for metastasis, stable low rectal mass with prominent mesorectal lymph nodes, stable pathologic enlarged right internal iliac nodes, stable pulmonary nodules ? Xeloda/radiation beginning 04/26/2019, completed 07/05/2019 ? CT 07/26/2019-growth of previously described right hepatic lesion, slight decrease in pelvic lymphadenopathy and rectal thickening ? 08/05/2019-week 1 bolus 5-FU/leucovorin (200 mg/m) ? 08/12/2019-week to bolus 5-FU/leucovorin (400 mg/m) ? 10/16/ 2020-week 3 bolus 5-FU/leucovorin (400 mg/m) and oxaliplatin (42.5 mg/m) ? CT chest 10/04/2019-slight increase in bilateral pulmonary nodules concerning for metastases ? MRI abdomen 10/06/2019-right hepatic lesion consistent with metastasis, wall thickening of the low anterior rectum, no new or enlarging adenopathy, stable mesorectal and right internal iliac nodes ? PET scan 10/14/2019-pulmonary nodules below PET resolution, hypermetabolic right posterior hepatic lesion, mildly hypermetabolic partially calcified right internal iliac chain lymphadenopathy, mild gastric hypermetabolism and thickening-likely gastritis ? 11/18/2019-LAR with diverting loop ileostomy and ablation of solitary liver metastasis (2 cm segment 7), moderately differentiated adenocarcinoma invading into soft tissue, LVI present, perineural invasion present, 5/15 lymph nodes positive, 4 tumor deposits, absent treatment effect-score 3,ypT3 ypN2a, mismatch repair protein expression intact, MSS ? CT chest 01/16/2020-increase in the size of a dominant  right lower lobe nodule, unchanged left upper lobe and left lower lobe nodules ? CT chest 04/11/2020-enlargement of bilateral lung nodules, and new nodules ? CT chest 09/11/2020-enlargement of bilateral lung nodules, aggressive medial right liver lesion ? Cycle 1 FOLFOX 10/31/2020 ? Cycle 2 FOLFOX 11/14/2020 ? Cycle 3 FOLFOX 11/29/2020 ? CT chest 01/03/2021-multiple bilateral lung nodules, stable to slightly decreased in size, no new lesions, hepatic dome lesion is smaller (ablated lesion?) 2. Pain secondary to #1, improved 3. Reactive airways disease/asthma 4. Report of multiple allergens 5. Port-A-Cath related DVT, right IJ, 09/30/2019-apixaban, converted to Lovenox surrounding surgery January 2021, changed back to apixaban 12/30/2019 6. Erectile dysfunction following treatment for rectal cancer-followed by urology      Disposition: Mr. Sime appears unchanged.  I reviewed the CT findings and images with him.  The bilateral lung nodules are generally smaller.  There was a significant delay between the baseline CT and the initiation of FOLFOX (greater than 6 weeks).  He understands it is likely there has been a clinical response to the chemotherapy.  We discussed treatment options.  He understands no therapy will be curative.  I recommend continuing FOLFOX.  He agrees to complete 3-4 additional cycles of FOLFOX.  We will add Emend for delayed nausea.  He will continue home Decadron prophylaxis.  Mr. Hopkin will schedule an appointment with the pelvic physical therapy clinic.  He will be scheduled for the next cycle of FOLFOX during the week of 01/07/2021.  He will return for an office visit and chemotherapy 2 weeks later.  Betsy Coder, MD  01/04/2021  3:04 PM

## 2021-01-07 ENCOUNTER — Encounter: Payer: Self-pay | Admitting: Oncology

## 2021-01-08 ENCOUNTER — Other Ambulatory Visit: Payer: Self-pay | Admitting: *Deleted

## 2021-01-08 ENCOUNTER — Other Ambulatory Visit: Payer: Self-pay | Admitting: Oncology

## 2021-01-08 MED ORDER — ALPRAZOLAM 0.25 MG PO TABS: 0.2500 mg | ORAL_TABLET | Freq: Every day | ORAL | 0 refills | Status: DC | PRN

## 2021-01-08 MED FILL — PROAIR HFA 90 MCG INHALER: 108 (90 BAS | 25 days supply | Qty: 9 | Fill #0

## 2021-01-08 MED FILL — ELIQUIS 5 MG TABLET: 5 | 30 days supply | Qty: 60 | Fill #0

## 2021-01-08 MED FILL — ALPRAZolam 0.25 MG TABS: 0.25 | 30 days supply | Qty: 30 | Fill #0

## 2021-01-08 MED FILL — DEXAMETHASONE 4 MG TABLET: 4 | 12 days supply | Qty: 24 | Fill #0

## 2021-01-08 NOTE — Telephone Encounter (Signed)
MyChart message requesting refill on his xanax. OK per Dr. Benay Spice for 0.25 mg/day and only #30-no refill. Patient notified.

## 2021-01-09 ENCOUNTER — Inpatient Hospital Stay: Payer: Medicaid Other

## 2021-01-09 ENCOUNTER — Ambulatory Visit (HOSPITAL_COMMUNITY): Payer: Medicaid Other

## 2021-01-09 ENCOUNTER — Telehealth: Payer: Self-pay | Admitting: Oncology

## 2021-01-09 NOTE — Telephone Encounter (Signed)
Scheduled appointments per 3/4 los. Spoke to patient who is aware of appointments dates and times.

## 2021-01-15 ENCOUNTER — Inpatient Hospital Stay: Payer: Medicaid Other

## 2021-01-15 ENCOUNTER — Telehealth: Payer: Self-pay

## 2021-01-15 NOTE — Telephone Encounter (Signed)
TC from [facility] and message received from [place] with scheduling about Pt not showing up for appointment. Left message on v/m for Pt to return call.

## 2021-01-15 NOTE — Telephone Encounter (Signed)
-----   Message from Riley Lam sent at 01/15/2021 10:54 AM EDT ----- Just wanted to let you all know that this patient is a NO SHOW here today.  No phone call or anything   Thanks Kenney Houseman

## 2021-01-16 ENCOUNTER — Encounter: Payer: Self-pay | Admitting: Oncology

## 2021-01-17 ENCOUNTER — Telehealth: Payer: Self-pay | Admitting: Oncology

## 2021-01-17 ENCOUNTER — Inpatient Hospital Stay: Payer: Medicaid Other

## 2021-01-17 NOTE — Telephone Encounter (Signed)
Rescheduled missed 03/15 appointment to 03/21, approved by charge nurses. Patient has been called and notified of upcoming appointments.

## 2021-01-18 ENCOUNTER — Encounter: Payer: Self-pay | Admitting: Oncology

## 2021-01-21 ENCOUNTER — Other Ambulatory Visit: Payer: Self-pay

## 2021-01-21 ENCOUNTER — Inpatient Hospital Stay: Payer: Medicaid Other

## 2021-01-21 ENCOUNTER — Other Ambulatory Visit: Payer: Self-pay | Admitting: *Deleted

## 2021-01-21 VITALS — BP 146/69 | HR 61 | Temp 98.7°F | Resp 18

## 2021-01-21 DIAGNOSIS — Z5111 Encounter for antineoplastic chemotherapy: Secondary | ICD-10-CM | POA: Diagnosis not present

## 2021-01-21 DIAGNOSIS — C20 Malignant neoplasm of rectum: Secondary | ICD-10-CM

## 2021-01-21 DIAGNOSIS — Z95828 Presence of other vascular implants and grafts: Secondary | ICD-10-CM

## 2021-01-21 LAB — CBC WITH DIFFERENTIAL (CANCER CENTER ONLY)
Abs Immature Granulocytes: 0.01 10*3/uL (ref 0.00–0.07)
Basophils Absolute: 0 10*3/uL (ref 0.0–0.1)
Basophils Relative: 1 %
Eosinophils Absolute: 0.3 10*3/uL (ref 0.0–0.5)
Eosinophils Relative: 5 %
HCT: 36.5 % — ABNORMAL LOW (ref 39.0–52.0)
Hemoglobin: 12.1 g/dL — ABNORMAL LOW (ref 13.0–17.0)
Immature Granulocytes: 0 %
Lymphocytes Relative: 30 %
Lymphs Abs: 1.5 10*3/uL (ref 0.7–4.0)
MCH: 29.4 pg (ref 26.0–34.0)
MCHC: 33.2 g/dL (ref 30.0–36.0)
MCV: 88.8 fL (ref 80.0–100.0)
Monocytes Absolute: 0.6 10*3/uL (ref 0.1–1.0)
Monocytes Relative: 12 %
Neutro Abs: 2.6 10*3/uL (ref 1.7–7.7)
Neutrophils Relative %: 52 %
Platelet Count: 280 10*3/uL (ref 150–400)
RBC: 4.11 MIL/uL — ABNORMAL LOW (ref 4.22–5.81)
RDW: 14.7 % (ref 11.5–15.5)
WBC Count: 5 10*3/uL (ref 4.0–10.5)
nRBC: 0 % (ref 0.0–0.2)

## 2021-01-21 LAB — CMP (CANCER CENTER ONLY)
ALT: 18 U/L (ref 0–44)
AST: 18 U/L (ref 15–41)
Albumin: 3.6 g/dL (ref 3.5–5.0)
Alkaline Phosphatase: 68 U/L (ref 38–126)
Anion gap: 4 — ABNORMAL LOW (ref 5–15)
BUN: 16 mg/dL (ref 6–20)
CO2: 24 mmol/L (ref 22–32)
Calcium: 8.5 mg/dL — ABNORMAL LOW (ref 8.9–10.3)
Chloride: 109 mmol/L (ref 98–111)
Creatinine: 1.16 mg/dL (ref 0.61–1.24)
GFR, Estimated: >60 mL/min (ref >60)
Glucose, Bld: 86 mg/dL (ref 70–99)
Potassium: 4.7 mmol/L (ref 3.5–5.1)
Sodium: 137 mmol/L (ref 135–145)
Total Bilirubin: 0.8 mg/dL (ref 0.3–1.2)
Total Protein: 6.5 g/dL (ref 6.5–8.1)

## 2021-01-21 LAB — CEA (IN HOUSE-CHCC): CEA (CHCC-In House): 12.74 ng/mL — ABNORMAL HIGH (ref 0.00–5.00)

## 2021-01-21 MED ORDER — SODIUM CHLORIDE 0.9 % IV SOLN
150.0000 mg | Freq: Once | INTRAVENOUS | Status: AC
  Administered 2021-01-21: 150 mg via INTRAVENOUS
  Filled 2021-01-21: qty 150

## 2021-01-21 MED ORDER — SODIUM CHLORIDE 0.9 % IV SOLN
1600.0000 mg/m2 | INTRAVENOUS | Status: DC
  Administered 2021-01-21: 3400 mg via INTRAVENOUS
  Filled 2021-01-21: qty 68

## 2021-01-21 MED ORDER — OXALIPLATIN CHEMO INJECTION 100 MG/20ML
85.0000 mg/m2 | Freq: Once | INTRAVENOUS | Status: AC
  Administered 2021-01-21: 180 mg via INTRAVENOUS
  Filled 2021-01-21: qty 36

## 2021-01-21 MED ORDER — LEUCOVORIN CALCIUM INJECTION 350 MG
300.0000 mg/m2 | Freq: Once | INTRAVENOUS | Status: AC
  Administered 2021-01-21: 636 mg via INTRAVENOUS
  Filled 2021-01-21: qty 31.8

## 2021-01-21 MED ORDER — PALONOSETRON HCL INJECTION 0.25 MG/5ML
INTRAVENOUS | Status: AC
  Filled 2021-01-21: qty 5

## 2021-01-21 MED ORDER — HEPARIN SOD (PORK) LOCK FLUSH 100 UNIT/ML IV SOLN
500.0000 [IU] | Freq: Once | INTRAVENOUS | Status: DC | PRN
  Filled 2021-01-21: qty 5

## 2021-01-21 MED ORDER — PALONOSETRON HCL INJECTION 0.25 MG/5ML
0.2500 mg | Freq: Once | INTRAVENOUS | Status: AC
  Administered 2021-01-21: 0.25 mg via INTRAVENOUS

## 2021-01-21 MED ORDER — SODIUM CHLORIDE 0.9% FLUSH
10.0000 mL | INTRAVENOUS | Status: DC | PRN
  Filled 2021-01-21: qty 10

## 2021-01-21 MED ORDER — SODIUM CHLORIDE 0.9 % IV SOLN
10.0000 mg | Freq: Once | INTRAVENOUS | Status: AC
  Administered 2021-01-21: 10 mg via INTRAVENOUS
  Filled 2021-01-21: qty 10

## 2021-01-21 MED ORDER — HYDROCORTISONE ACETATE 25 MG RE SUPP: 25.0000 mg | Freq: Two times a day (BID) | RECTAL | 1 refills | Status: DC | PRN

## 2021-01-21 MED ORDER — DEXTROSE 5 % IV SOLN
Freq: Once | INTRAVENOUS | Status: AC
Start: 2021-01-21 — End: 2021-01-21
  Filled 2021-01-21: qty 250

## 2021-01-21 MED ORDER — SODIUM CHLORIDE 0.9% FLUSH
10.0000 mL | Freq: Once | INTRAVENOUS | Status: AC
  Administered 2021-01-21: 10 mL
  Filled 2021-01-21: qty 10

## 2021-01-21 NOTE — Patient Instructions (Signed)
[facility] Discharge Instructions for Patients Receiving Chemotherapy  Today you received the following chemotherapy agents: Oxaliplatin, Leucovorin, 5FU pump  To help prevent nausea and vomiting after your treatment, we encourage you to take your nausea medication as directed.    If you develop nausea and vomiting that is not controlled by your nausea medication, call the clinic.   BELOW ARE SYMPTOMS THAT SHOULD BE REPORTED IMMEDIATELY:  *FEVER GREATER THAN 100.5 F  *CHILLS WITH OR WITHOUT FEVER  NAUSEA AND VOMITING THAT IS NOT CONTROLLED WITH YOUR NAUSEA MEDICATION  *UNUSUAL SHORTNESS OF BREATH  *UNUSUAL BRUISING OR BLEEDING  TENDERNESS IN MOUTH AND THROAT WITH OR WITHOUT PRESENCE OF ULCERS  *URINARY PROBLEMS  *BOWEL PROBLEMS  UNUSUAL RASH Items with * indicate a potential emergency and should be followed up as soon as possible.  Feel free to call the clinic should you have any questions or concerns. The clinic phone number is [phone].  Please show the [facility] at check-in to the Emergency Department and triage nurse.

## 2021-01-22 ENCOUNTER — Encounter: Payer: Self-pay | Admitting: Oncology

## 2021-01-22 ENCOUNTER — Telehealth: Payer: Self-pay | Admitting: Oncology

## 2021-01-22 MED FILL — ONDANSETRON HCL 8 MG TABLET: 8 | 10 days supply | Qty: 30 | Fill #0

## 2021-01-22 NOTE — Telephone Encounter (Signed)
Scheduled per 03/21 scheduled message, patient has been called and voicemail was left.

## 2021-01-23 ENCOUNTER — Other Ambulatory Visit: Payer: Self-pay

## 2021-01-23 ENCOUNTER — Ambulatory Visit: Payer: Medicaid Other

## 2021-01-23 ENCOUNTER — Other Ambulatory Visit: Payer: Medicaid Other

## 2021-01-23 ENCOUNTER — Inpatient Hospital Stay: Payer: Medicaid Other

## 2021-01-23 ENCOUNTER — Ambulatory Visit: Payer: Medicaid Other | Admitting: Oncology

## 2021-01-23 ENCOUNTER — Other Ambulatory Visit: Payer: Self-pay | Admitting: *Deleted

## 2021-01-23 ENCOUNTER — Ambulatory Visit: Payer: Medicaid Other | Admitting: Nurse Practitioner

## 2021-01-23 VITALS — BP 141/90 | HR 79 | Temp 98.6°F | Resp 18

## 2021-01-23 DIAGNOSIS — C20 Malignant neoplasm of rectum: Secondary | ICD-10-CM

## 2021-01-23 DIAGNOSIS — Z5111 Encounter for antineoplastic chemotherapy: Secondary | ICD-10-CM | POA: Diagnosis not present

## 2021-01-23 MED ORDER — HEPARIN SOD (PORK) LOCK FLUSH 100 UNIT/ML IV SOLN
500.0000 [IU] | Freq: Once | INTRAVENOUS | Status: AC | PRN
  Administered 2021-01-23: 500 [IU]
  Filled 2021-01-23: qty 5

## 2021-01-23 MED ORDER — HYDROCORTISONE ACE-PRAMOXINE 1-1 % EX CREA: 1.0000 "application " | TOPICAL_CREAM | Freq: Two times a day (BID) | CUTANEOUS | 1 refills | Status: DC | PRN

## 2021-01-23 MED ORDER — SODIUM CHLORIDE 0.9% FLUSH
10.0000 mL | INTRAVENOUS | Status: DC | PRN
  Administered 2021-01-23: 10 mL
  Filled 2021-01-23: qty 10

## 2021-01-23 MED FILL — HYDROCORT-PRAMOXINE 1%-1% C: 1-1 | 15 days supply | Qty: 60 | Fill #0

## 2021-01-23 NOTE — Progress Notes (Signed)
Insurance has denied the hydrocortisone 25 mg suppository. Determined they will cover the [facility] 1-1% cream--insert bid rectally via applicator. This was confirmed w/[facility]. New script sent.

## 2021-01-24 ENCOUNTER — Other Ambulatory Visit: Payer: Medicaid Other

## 2021-01-24 ENCOUNTER — Ambulatory Visit: Payer: Medicaid Other

## 2021-01-28 ENCOUNTER — Encounter: Payer: Self-pay | Admitting: Oncology

## 2021-01-28 ENCOUNTER — Other Ambulatory Visit: Payer: Self-pay | Admitting: Oncology

## 2021-01-28 ENCOUNTER — Telehealth: Payer: Self-pay

## 2021-01-28 NOTE — Telephone Encounter (Signed)
Received notification of after hours triage nurse call over the weekend.  I called patient to check on him.  He did not go to the ED as instructed.  He states his diarrhea has submitted, eating and drinking well, but continues with fatigue and joint aches.  Also complains of indigestion.  He has Prilosec at home and I have instructed him to take it every day.  I offered for him to come in to be seen but he declined.  I instructed him that if he does not improve over next day or so to call back.  He verbalized an understanding.

## 2021-01-30 ENCOUNTER — Ambulatory Visit: Payer: Medicaid Other | Admitting: Oncology

## 2021-01-30 ENCOUNTER — Ambulatory Visit: Payer: Medicaid Other

## 2021-01-30 ENCOUNTER — Other Ambulatory Visit: Payer: Medicaid Other

## 2021-01-30 NOTE — Telephone Encounter (Signed)
Please review for refill is appropriate

## 2021-02-02 ENCOUNTER — Other Ambulatory Visit: Payer: Self-pay | Admitting: Oncology

## 2021-02-06 ENCOUNTER — Inpatient Hospital Stay: Payer: Medicaid Other

## 2021-02-06 ENCOUNTER — Inpatient Hospital Stay: Payer: Medicaid Other | Admitting: Oncology

## 2021-02-12 ENCOUNTER — Encounter: Payer: Self-pay | Admitting: Oncology

## 2021-02-18 ENCOUNTER — Inpatient Hospital Stay: Payer: Medicaid Other | Attending: Oncology

## 2021-02-18 ENCOUNTER — Inpatient Hospital Stay: Payer: Medicaid Other

## 2021-02-18 ENCOUNTER — Ambulatory Visit: Payer: Medicaid Other

## 2021-02-18 ENCOUNTER — Telehealth: Payer: Self-pay | Admitting: Oncology

## 2021-02-18 ENCOUNTER — Telehealth: Payer: Self-pay

## 2021-02-18 ENCOUNTER — Encounter: Payer: Self-pay | Admitting: Oncology

## 2021-02-18 ENCOUNTER — Inpatient Hospital Stay: Payer: Medicaid Other | Admitting: Oncology

## 2021-02-18 NOTE — Progress Notes (Deleted)
[place] OFFICE PROGRESS NOTE   Diagnosis:   INTERVAL HISTORY:   ***  Objective:  Vital signs in last 24 hours:  There were no vitals taken for this visit.    HEENT: *** Lymphatics: *** Resp: *** Cardio: *** GI: *** Vascular: *** Neuro:***  Skin:***   Portacath/PICC-without erythema  Lab Results:  Lab Results  Component Value Date   WBC 5.0 01/21/2021   HGB 12.1 (L) 01/21/2021   HCT 36.5 (L) 01/21/2021   MCV 88.8 01/21/2021   PLT 280 01/21/2021   NEUTROABS 2.6 01/21/2021    CMP  Lab Results  Component Value Date   NA 137 01/21/2021   K 4.7 01/21/2021   CL 109 01/21/2021   CO2 24 01/21/2021   GLUCOSE 86 01/21/2021   BUN 16 01/21/2021   CREATININE 1.16 01/21/2021   CALCIUM 8.5 (L) 01/21/2021   PROT 6.5 01/21/2021   ALBUMIN 3.6 01/21/2021   AST 18 01/21/2021   ALT 18 01/21/2021   ALKPHOS 68 01/21/2021   BILITOT 0.8 01/21/2021   GFRNONAA >60 01/21/2021   GFRAA >60 01/02/2020    Lab Results  Component Value Date   CEA1 12.74 (H) 01/21/2021     Medications: I have reviewed the patient's current medications.   Assessment/Plan: 1. Rectal cancer, clinical stage III (T3cN2), Foundation 1 on lymph node 11/18/2019-tumor mutation burden 1, FG R1 amplification, K-ras wild-type, microsatellite status could not be determined ? Rectal mass extending from 4-9 cm on colonoscopy 02/15/2019 with a biopsy confirming invasive adenocarcinoma ? CTs 02/23/2019-thickening of the low rectum without a discrete mass, small bilateral pulmonary nodules, some present on imaging from 2014 ? Pelvic MRI 02/25/2019-T3cN2 tumor beginning at 4.9 cm from the anal verge and 0 cm from the internal anal sphincter, tumor extends through the muscularis propria, 2. Rectal lymph nodes and 2 enlarged right internal iliac nodes ? CTs at [facility] 04/19/2019-new subcentimeter right hepatic lesion concerning for metastasis, stable low rectal mass with prominent mesorectal lymph  nodes, stable pathologic enlarged right internal iliac nodes, stable pulmonary nodules ? Xeloda/radiation beginning 04/26/2019, completed 07/05/2019 ? CT 07/26/2019-growth of previously described right hepatic lesion, slight decrease in pelvic lymphadenopathy and rectal thickening ? 08/05/2019-week 1 bolus 5-FU/leucovorin (200 mg/m) ? 08/12/2019-week to bolus 5-FU/leucovorin (400 mg/m) ? 10/16/ 2020-week 3 bolus 5-FU/leucovorin (400 mg/m) and oxaliplatin (42.5 mg/m) ? CT chest 10/04/2019-slight increase in bilateral pulmonary nodules concerning for metastases ? MRI abdomen 10/06/2019-right hepatic lesion consistent with metastasis, wall thickening of the low anterior rectum, no new or enlarging adenopathy, stable mesorectal and right internal iliac nodes ? PET scan 10/14/2019-pulmonary nodules below PET resolution, hypermetabolic right posterior hepatic lesion, mildly hypermetabolic partially calcified right internal iliac chain lymphadenopathy, mild gastric hypermetabolism and thickening-likely gastritis ? 11/18/2019-LAR with diverting loop ileostomy and ablation of solitary liver metastasis (2 cm segment 7), moderately differentiated adenocarcinoma invading into soft tissue, LVI present, perineural invasion present, 5/15 lymph nodes positive, 4 tumor deposits, absent treatment effect-score 3,ypT3 ypN2a, mismatch repair protein expression intact, MSS ? CT chest 01/16/2020-increase in the size of a dominant right lower lobe nodule, unchanged left upper lobe and left lower lobe nodules ? CT chest 04/11/2020-enlargement of bilateral lung nodules, and new nodules ? CT chest 09/11/2020-enlargement of bilateral lung nodules, aggressive medial right liver lesion ? Cycle 1 FOLFOX 10/31/2020 ? Cycle 2 FOLFOX 11/14/2020 ? Cycle 3 FOLFOX 11/29/2020 ? CT chest 01/03/2021-multiple bilateral lung nodules, stable to slightly decreased in size, no new lesions, hepatic dome lesion is  smaller (ablated lesion?) 2. Pain secondary  to #1, improved 3. Reactive airways disease/asthma 4. Report of multiple allergens 5. Port-A-Cath related DVT, right IJ, 09/30/2019-apixaban, converted to Lovenox surrounding surgery January 2021, changed back to apixaban 12/30/2019 6. Erectile dysfunction following treatment for rectal cancer-followed by urology        Disposition: ***  Betsy Coder, MD  02/18/2021  7:15 AM

## 2021-02-18 NOTE — Telephone Encounter (Signed)
TC to Pt in reference to missed appointment. Pt stated he was not feeling good today and woke up after appointment was already missed. Asked Pt if he would like to re-schedule appointment for treatment. Pt stated " He will come talk to Dr Benay Spice and if he can find something else to do besides treatment he will discuss that with him". Informed Pt he will get a call from scheduling to reschedule appointment with Dr. Benay Spice. Pt verbalized understanding.

## 2021-02-18 NOTE — Telephone Encounter (Signed)
Called pt to reschedule 4/18 appt - no answer . Left message for patient to call back to reschedule appt.

## 2021-02-19 ENCOUNTER — Other Ambulatory Visit: Payer: Self-pay

## 2021-02-21 ENCOUNTER — Other Ambulatory Visit (HOSPITAL_COMMUNITY): Payer: Self-pay

## 2021-02-21 MED FILL — Hydrocortisone Acetate w/ Pramoxine Perianal Cream 1-1%: CUTANEOUS | 30 days supply | Qty: 60 | Fill #0 | Status: AC

## 2021-03-01 ENCOUNTER — Encounter: Payer: Self-pay | Admitting: Oncology

## 2021-03-01 ENCOUNTER — Telehealth: Payer: Self-pay | Admitting: Gastroenterology

## 2021-03-01 NOTE — Telephone Encounter (Signed)
Spoke with patient, he has been scheduled to see Tye Savoy, NP on Monday, 03/04/21 at 11 AM. Patient had no other concerns at the end of the call.   Elwin Mocha, CMA notified that patient was added on.

## 2021-03-01 NOTE — Telephone Encounter (Signed)
Inbound call from patient. Patient was dx with rectal cancer in 2020. Had surgery at [facility] 11/2019. States having internal hemorrhoids since August, pains, immobile , rectal oozing, uncontrollable gas. Area from stitch is not fully healed. He is in so much pain and would like an appointment as soon as he can. Could you please call as soon as can? Next available appointment for Dr. Rush Landmark is 04/03/21.

## 2021-03-04 ENCOUNTER — Ambulatory Visit (INDEPENDENT_AMBULATORY_CARE_PROVIDER_SITE_OTHER): Payer: Medicaid Other | Admitting: Nurse Practitioner

## 2021-03-04 ENCOUNTER — Encounter: Payer: Self-pay | Admitting: Nurse Practitioner

## 2021-03-04 ENCOUNTER — Other Ambulatory Visit (HOSPITAL_COMMUNITY): Payer: Self-pay

## 2021-03-04 VITALS — BP 118/70 | HR 72 | Ht 67.5 in | Wt 212.2 lb

## 2021-03-04 DIAGNOSIS — K6289 Other specified diseases of anus and rectum: Secondary | ICD-10-CM

## 2021-03-04 MED ORDER — HYDROCORTISONE ACE-PRAMOXINE 1-1 % EX CREA
TOPICAL_CREAM | CUTANEOUS | 1 refills | Status: DC
  Filled 2021-03-04: qty 60, 30d supply, fill #0
  Filled 2021-03-05 (×2): qty 60, 7d supply, fill #0
  Filled 2021-03-05: qty 60, 30d supply, fill #0
  Filled 2021-03-05: qty 60, 7d supply, fill #0

## 2021-03-04 NOTE — Patient Instructions (Signed)
If you are age 52 or older, your body mass index should be between 23-30. Your Body mass index is 32.75 kg/m. If this is out of the aforementioned range listed, please consider follow up with your Primary Care Provider.  If you are age 60 or younger, your body mass index should be between 19-25. Your Body mass index is 32.75 kg/m. If this is out of the aformentioned range listed, please consider follow up with your Primary Care Provider.   We have sent the following medications to your pharmacy for you to pick up at your convenience: Proctocream rectally for one week.  Thank you for choosing me and [facility].  Tye Savoy, NP

## 2021-03-04 NOTE — Progress Notes (Signed)
ASSESSMENT AND PLAN     # 50 yo male with metastatic rectal cancer, s/p neoadjuvant therapy followed by LAR with ileostomy and ablation of a liver lesion in January 2021 at [facility].  Back undergoing chemotherapy for lung lesions. He is followed at [facility] and also by Dr. Benay Spice. We have not seen him since cancer was diagnosed in April 2020 ( Dr. Rush Landmark)  # Rectal pain, fecal leakage and urinary problems since resection.  Followed by [facility] . He was getting pelvic floor PT but had to stop due to rectal pain. Patient says he saw Surgery at [facility] in February for pain ( not cannot be seen in Care Everywhere). Told everything looked okay,  was healing but also treated for hemorrhoids. Pain got better but has now come to our office with recurrent rectal pain and also having some associated rectal bleeding.  that time. He has internal hemorrhoids on anoscopy but I cannot imagine that it causing all of his pain  --Will treat hemorrhoids with topical steroid cream. If symptoms persist I can discuss with Dr. Rush Landmark to see if flex sigmoidoscopy would be helpful  HISTORY OF PRESENT ILLNESS    Chief Complaint : rectal pain  Bradley Colon is a 50 y.o. male with a past medical history significant for fibromyalgia, adjustment disorder, and rectal cancer. He is known to Dr. Rush Landmark who diagnosed him with rectal adenocarcinoma in April 2020. Patient has not been seen here since that time.  INTERVAL HISTORY:   Following diagnosis of rectal cancer in 2020 patient went to [facility] for a second opinion on course of treatment. He saw Oncologist Dr. Hester Mates and after staging CT scans and MRI he was diagnosed with cT3N+Mx rectal cancer. He underwent  neoadjuvant chemotherapy followed by radiation. Multiple treatment delays resulted in a solitary liver metastasis.  He has been followed at [facility] but also locally by Dr. Benay Spice.  In January 2021 he had a LAR with diverting ileostomy and  ablation of liver mets. Subsequently imaging demonstrated enlarging bilateral lung nodules. He restarted chemotherapy for metastatic rectal cancer with Dr. Learta Codding in December 2021.   Postoperatively patient has had problems with rectal pain, fecal leakage,  scrotal pain and erectile dysfunction. He has been treated by [facility]. Initially he required narcotics but was able to wean off. He was subsequently treated with Lyrica and pelvic floor trigger point injections. He underwent pelvic floor Pt. For erectile dysfunction he has been followed at [facility]  In February patient saw Surgical Oncology at [facility] he saw the surgery at [facility], he says for rectal pain. I cannot see that note but per patient,  a "scope" was placed in rectum and he was told he had hemorhroids but otherwise he was still healing from surgery. He got better with hemorrhoid treatment.   Patient has now come here for evaluation of recurrent rectal pain and he has also been having some rectal bleeding. Another provider gave him hydrocortisone cream but say he was only given 3 days worth. He has rectal pain with defecation, even pain with sitting. He is frustrated about the recurrence of pain and wants to confirm if hemorrhoids causing the pain. He denies constipation / straining. He takes stool softeners.     PREVIOUS ENDOSCOPIC EVALUATIONS / PERTINENT STUDIES:   02/15/19 Colonoscopy  -Palpable rectal mass-lesion, non-thrombosed external hemorrhoids and non-thrombosed internal hemorrhoids found on digital rectal exam. - The examined portion of the ileum was normal. -  Normal mucosa in the recto-sigmoid colon, in the sigmoid colon, in the descending colon, at the splenic flexure, in the transverse colon, at the hepatic flexure, in the ascending colon and in the cecum. - Likely malignant tumor in the distal rectum to the mid rectum. Patient with long standing constipation, an abnormal Sterocal ulceration could also be  considered. Biopsied. Tattooed proximally. - Non-bleeding non-thrombosed external and internal hemorrhoids.   Path c/w adenocarcinoma  Past Medical History:  Diagnosis Date  . Aggressiveness 12/28/2018  . Asthma   . Cancer ([place])   . Cervical radiculopathy   . Chronic back pain   . Kidney stones   . Reactive airway disease     Current Medications, Allergies, Past Surgical History, Family History and Social History were reviewed in [employer] record.   Current Outpatient Medications  Medication Sig Dispense Refill  . albuterol (VENTOLIN HFA) 108 (90 Base) MCG/ACT inhaler INHALE 2 PUFFS INTO THE LUNGS EVERY 6 (SIX) HOURS AS NEEDED FOR WHEEZING OR SHORTNESS OF BREATH. 8.5 g 1  . ALPRAZolam (XANAX) 0.25 MG tablet TAKE 1 TABLET BY MOUTH ONCE DAILY AS NEEDED FOR ANXIETY DO NOT DRIVE 30 tablet 0  . calcium carbonate (TUMS - DOSED IN MG ELEMENTAL CALCIUM) 500 MG chewable tablet Chew 1 tablet by mouth daily.     . cyclobenzaprine (FLEXERIL) 5 MG tablet Take 5 mg by mouth 3 (three) times daily as needed for muscle spasms.    . diphenhydrAMINE (BENADRYL) 50 MG capsule Take 50 mg by mouth every 6 (six) hours as needed. At night for itching/recent rash on back/shoulder/arms    . Docusate Sodium (DSS) 100 MG CAPS Take 100 mg by mouth in the morning and at bedtime.    . lidocaine-prilocaine (EMLA) cream Apply 1 application topically as directed. Apply to port 1 hour prior to stick and cover with plastic wrap 30 g 2  . loratadine (CLARITIN) 10 MG tablet Take 10 mg by mouth daily as needed.    . Nitroglycerin (RECTIV) 0.4 % OINT Place 1 application rectally daily.    . NON FORMULARY Take 2-3 capsules by mouth daily. Turmeric and Boswella    . Nutritional Supplements (ENSURE PLANT-BASED PROTEIN) LIQD Take by mouth 2 (two) times daily.     . ondansetron (ZOFRAN) 8 MG tablet TAKE 1 TABLET (8 MG TOTAL) BY MOUTH EVERY 8 (EIGHT) HOURS AS NEEDED FOR NAUSEA OR VOMITING. 30 tablet 1  .  pramoxine-hydrocortisone (PROCTOCREAM-HC) 1-1 % rectal cream PLACE 1 APPLICATION RECTALLY 2 (TWO) TIMES DAILY AS NEEDED FOR HEMORRHOIDS OR ANAL ITCHING. 60 g 1  . pregabalin (LYRICA) 25 MG capsule Take 1 capsule (25 mg total) by mouth 2 (two) times daily. 60 capsule 0  . prochlorperazine (COMPAZINE) 10 MG tablet Take 10 mg by mouth every 6 (six) hours as needed.     No current facility-administered medications for this visit.    Review of Systems: No chest pain. No shortness of breath. No urinary complaints.   PHYSICAL EXAM :    Wt Readings from Last 3 Encounters:  03/04/21 212 lb 4 oz (96.3 kg)  01/04/21 208 lb 4.8 oz (94.5 kg)  12/31/20 207 lb 4.8 oz (94 kg)    BP 118/70 (BP Location: Left Arm, Patient Position: Sitting, Cuff Size: Normal)   Pulse 72   Ht 5' 7.5" (1.715 m) Comment: height measured without shoes  Wt 212 lb 4 oz (96.3 kg)   BMI 32.75 kg/m  Constitutional:  Pleasant male in  no acute distress. Psychiatric: Normal mood and affect. Behavior is normal. EENT: Pupils normal.  Conjunctivae are normal. No scleral icterus. Neck supple.  Cardiovascular: Normal rate, regular rhythm. No edema Pulmonary/chest: Effort normal and breath sounds normal. No wheezing, rales or rhonchi. Abdominal: Soft, nondistended, nontender. Bowel sounds active throughout. There are no masses palpable. No hepatomegaly. Rectal:  No obvious fissures. On anoscopy there were inflamed internal hemorrhoids . Neurological: Alert and oriented to person place and time. Skin: Skin is warm and dry. No rashes noted.  I spent 30 minutes total reviewing records, obtaining history, performing exam, counseling patient and documenting visit / findings.    Tye Savoy, NP  03/04/2021, 11:43 AM

## 2021-03-05 ENCOUNTER — Other Ambulatory Visit (HOSPITAL_COMMUNITY): Payer: Self-pay

## 2021-03-05 ENCOUNTER — Telehealth: Payer: Self-pay | Admitting: Nurse Practitioner

## 2021-03-05 NOTE — Telephone Encounter (Signed)
Called patient back and he was on the phone with [facility]. I informed patient he was given a 30 day supply and is only supposed to use twice daily . Patient stated that applicator is hard for him to use and sometimes uses more cream than is meant to. Patient also stated that he had the suppositories before and was trying to get the suppositories again because they worked better.

## 2021-03-05 NOTE — Telephone Encounter (Signed)
Inbound call from patient. Called in stating yes he picked up prescription last week and it is now gone because he have been in so much pain and it is not working. Asking if the cream prescription can be overridden. Best contact number [phone]

## 2021-03-06 ENCOUNTER — Other Ambulatory Visit (HOSPITAL_COMMUNITY): Payer: Self-pay

## 2021-03-08 ENCOUNTER — Encounter: Payer: Self-pay | Admitting: *Deleted

## 2021-03-08 ENCOUNTER — Other Ambulatory Visit (HOSPITAL_COMMUNITY): Payer: Self-pay

## 2021-03-08 MED ORDER — BELLADONNA ALKALOIDS-OPIUM 16.2-30 MG RE SUPP
1.0000 | Freq: Three times a day (TID) | RECTAL | 0 refills | Status: DC | PRN
  Filled 2021-03-08: qty 30, 10d supply, fill #0
  Filled 2021-03-08: qty 12, 4d supply, fill #0

## 2021-03-10 ENCOUNTER — Encounter: Payer: Self-pay | Admitting: Nurse Practitioner

## 2021-03-11 NOTE — Progress Notes (Signed)
Attending Physician's Attestation   I have reviewed the chart.   I agree with the Advanced Practitioner's note, impression, and recommendations with any updates as below.    Masin Shatto Mansouraty, MD [facility] Advanced Endoscopy Office # [phone]  

## 2021-03-12 ENCOUNTER — Other Ambulatory Visit (HOSPITAL_COMMUNITY): Payer: Self-pay

## 2021-03-21 ENCOUNTER — Encounter: Payer: Self-pay | Admitting: Oncology

## 2021-03-21 ENCOUNTER — Emergency Department (HOSPITAL_COMMUNITY)
Admission: EM | Admit: 2021-03-21 | Discharge: 2021-03-21 | Disposition: A | Discharge status: Home / Self Care | Payer: Medicaid Other | Attending: Emergency Medicine | Admitting: Emergency Medicine

## 2021-03-21 ENCOUNTER — Emergency Department (HOSPITAL_BASED_OUTPATIENT_CLINIC_OR_DEPARTMENT_OTHER): Payer: Medicaid Other

## 2021-03-21 ENCOUNTER — Encounter (HOSPITAL_COMMUNITY): Payer: Self-pay | Admitting: Pharmacy Technician

## 2021-03-21 ENCOUNTER — Other Ambulatory Visit (HOSPITAL_COMMUNITY): Payer: Self-pay

## 2021-03-21 DIAGNOSIS — M79604 Pain in right leg: Secondary | ICD-10-CM | POA: Diagnosis not present

## 2021-03-21 DIAGNOSIS — Z9221 Personal history of antineoplastic chemotherapy: Secondary | ICD-10-CM | POA: Insufficient documentation

## 2021-03-21 DIAGNOSIS — Z91040 Latex allergy status: Secondary | ICD-10-CM | POA: Insufficient documentation

## 2021-03-21 DIAGNOSIS — J45909 Unspecified asthma, uncomplicated: Secondary | ICD-10-CM | POA: Insufficient documentation

## 2021-03-21 DIAGNOSIS — G893 Neoplasm related pain (acute) (chronic): Secondary | ICD-10-CM | POA: Insufficient documentation

## 2021-03-21 DIAGNOSIS — K6289 Other specified diseases of anus and rectum: Secondary | ICD-10-CM | POA: Diagnosis present

## 2021-03-21 DIAGNOSIS — E860 Dehydration: Secondary | ICD-10-CM | POA: Insufficient documentation

## 2021-03-21 DIAGNOSIS — F17290 Nicotine dependence, other tobacco product, uncomplicated: Secondary | ICD-10-CM | POA: Insufficient documentation

## 2021-03-21 DIAGNOSIS — Z86718 Personal history of other venous thrombosis and embolism: Secondary | ICD-10-CM | POA: Diagnosis not present

## 2021-03-21 DIAGNOSIS — C785 Secondary malignant neoplasm of large intestine and rectum: Secondary | ICD-10-CM | POA: Insufficient documentation

## 2021-03-21 DIAGNOSIS — M79661 Pain in right lower leg: Secondary | ICD-10-CM | POA: Insufficient documentation

## 2021-03-21 LAB — COMPREHENSIVE METABOLIC PANEL
ALT: 35 U/L (ref 0–44)
AST: 29 U/L (ref 15–41)
Albumin: 4 g/dL (ref 3.5–5.0)
Alkaline Phosphatase: 67 U/L (ref 38–126)
Anion gap: 10 (ref 5–15)
BUN: 27 mg/dL — ABNORMAL HIGH (ref 6–20)
CO2: 22 mmol/L (ref 22–32)
Calcium: 9.5 mg/dL (ref 8.9–10.3)
Chloride: 103 mmol/L (ref 98–111)
Creatinine, Ser: 1.4 mg/dL — ABNORMAL HIGH (ref 0.61–1.24)
GFR, Estimated: >60 mL/min (ref >60)
Glucose, Bld: 101 mg/dL — ABNORMAL HIGH (ref 70–99)
Potassium: 5.2 mmol/L — ABNORMAL HIGH (ref 3.5–5.1)
Sodium: 135 mmol/L (ref 135–145)
Total Bilirubin: 1.1 mg/dL (ref 0.3–1.2)
Total Protein: 7.4 g/dL (ref 6.5–8.1)

## 2021-03-21 LAB — CBC WITH DIFFERENTIAL/PLATELET
Abs Immature Granulocytes: 0.13 10*3/uL — ABNORMAL HIGH (ref 0.00–0.07)
Basophils Absolute: 0.1 10*3/uL (ref 0.0–0.1)
Basophils Relative: 1 %
Eosinophils Absolute: 0.2 10*3/uL (ref 0.0–0.5)
Eosinophils Relative: 2 %
HCT: 48.1 % (ref 39.0–52.0)
Hemoglobin: 15.8 g/dL (ref 13.0–17.0)
Immature Granulocytes: 2 %
Lymphocytes Relative: 24 %
Lymphs Abs: 1.7 10*3/uL (ref 0.7–4.0)
MCH: 30 pg (ref 26.0–34.0)
MCHC: 32.8 g/dL (ref 30.0–36.0)
MCV: 91.3 fL (ref 80.0–100.0)
Monocytes Absolute: 1 10*3/uL (ref 0.1–1.0)
Monocytes Relative: 14 %
Neutro Abs: 4.2 10*3/uL (ref 1.7–7.7)
Neutrophils Relative %: 57 %
Platelets: 331 10*3/uL (ref 150–400)
RBC: 5.27 MIL/uL (ref 4.22–5.81)
RDW: 13.7 % (ref 11.5–15.5)
WBC: 7.2 10*3/uL (ref 4.0–10.5)
nRBC: 0 % (ref 0.0–0.2)

## 2021-03-21 MED ORDER — OXYCODONE HCL 5 MG PO TABS
5.0000 mg | ORAL_TABLET | Freq: Four times a day (QID) | ORAL | 0 refills | Status: DC | PRN
  Filled 2021-03-21: qty 6, 2d supply, fill #0

## 2021-03-21 MED ORDER — OXYCODONE HCL 5 MG PO TABS
5.0000 mg | ORAL_TABLET | Freq: Once | ORAL | Status: AC
  Administered 2021-03-21: 5 mg via ORAL
  Filled 2021-03-21: qty 1

## 2021-03-21 MED ORDER — SODIUM CHLORIDE 0.9 % IV BOLUS
1000.0000 mL | Freq: Once | INTRAVENOUS | Status: AC
  Administered 2021-03-21: 1000 mL via INTRAVENOUS

## 2021-03-21 NOTE — Discharge Instructions (Addendum)
  You were evaluated in the Emergency Department and after careful evaluation, we did not find any emergent condition requiring admission or further testing in the hospital.   Your exam/testing today was overall reassuring.  Regards to your anal fissure, continue to do the treatments at home that we discussed that you are already doing, including sitz bath's, lidocaine, etc.  You can take ibuprofen or Tylenol as directed on the bottle for pain, if the pain is severe you can take the oxycodone that we discussed.  I only gave you a couple of pills that you can take until you see a pain clinic as we discussed you need to get a referral from your primary care doctor.  Please follow-up with them as soon as you can.  In regards to your dehydration, we gave you some fluids today, stop taking potassium and magnesium pills.  Your creatinine and potassium was slightly elevated, have this rechecked with your primary care doctor in a week.  If you have any new worsening concerning symptoms please come back to the ER.   Lease follow-up with your oncologist and your GI doctor as we discussed in regards to your anal fissure.   Please return to the Emergency Department if you experience any worsening of your condition.  Thank you for allowing [place] to be a part of your care. Please speak to your pharmacist about any new medications prescribed today in regards to side effects or interactions with other medications.

## 2021-03-21 NOTE — ED Provider Notes (Signed)
[place] EMERGENCY DEPARTMENT Provider Note   CSN: [id number] Arrival date & time: 03/21/21  1049     History No chief complaint on file.   Bradley Colon is a 50 y.o. male w/ past medical history of hemorrhoids in the setting of metastatic rectal cancer on active therapy followed by [facility] oncology that presents to the emergency department today for ongoing fissure and concerns for dehydration.  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  At that time oncologist, Dr. 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  Was given recommendations regard to diet and physical therapy.  Patient tells me that he is primarily here for pain control, states that the " Fissure:" Has been hurting so bad that he has been taking his leftover oxycodone from his cancer treatments for this.  Is asking for more oxycodone tablets at this time since patient states that he only has 5 and is been breaking them in half.  States that he has been trying to get into pain clinics, however the wait list is very long.  Denies any rectal bleeding.  Denies any nausea vomiting or diarrhea.  Denies any abdominal pain or pain elsewhere, states that this is why he is primarily here.  Patient also states that he feels as if he is dehydrated, due to low intake.  Patient appears extremely anxious, stating that he is concerned that he also might have a blood clot in his right lower leg.  States that he does have history of blood clot in his calf has been cramping recently.  Patient states that he has been taking extra magnesium pills and other supplements to help with this.  Patient denies any falls to the right lower leg, will sometimes happen, feels like a cramping sensation.  Denies any swelling or difficulty walking on that leg.  Denies any chest pain or shortness of breath.  Is not any blood thinners.    HPI      Past Medical History:  Diagnosis Date  . Aggressiveness 12/28/2018  . Asthma   . Cancer ([place])   . Cervical radiculopathy   . Chronic back pain   . Kidney stones   . Reactive airway disease     Patient Active Problem List   Diagnosis Date Noted  . Goals of care, counseling/discussion 10/25/2020  . Port-A-Cath in place 04/12/2020  . Rectal cancer ([place]) 03/02/2019  . Reactive airway disease 03/02/2019  . Vegetarian 03/02/2019  . Adjustment disorder with mixed anxiety and depressed mood 03/02/2019  . 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 Change in bowel habits 01/31/2019  . Constipation 01/31/2019  . Grade III hemorrhoids 01/31/2019  . Rectal pain 01/31/2019  . Fibromyalgia 12/18/2017  . Dysesthesia affecting both sides of body 12/18/2017  . Spondylosis without myelopathy or radiculopathy, lumbar region 12/18/2017  . Chronic post-traumatic stress disorder (PTSD) 05/25/2017  . Other fatigue 07/27/2016  . Chronic left shoulder pain 07/22/2016  . Chronic neck pain 11/19/2015  . Chronic low back pain 09/18/2014    Past Surgical History:  Procedure Laterality Date  . EPIDURAL BLOCK INJECTION    . laproscopic anterior resection for rectal cancer   11/18/2019  . SHOULDER SURGERY         Family History  Problem Relation Age of Onset  . Hypertension Maternal Grandmother   . Diabetes Maternal Grandmother   . Lung cancer Maternal Grandfather   .  Breast cancer Paternal Grandmother   . Colon cancer Neg Hx   . Esophageal cancer Neg Hx   . Rectal cancer Neg Hx   . Stomach cancer Neg Hx   . Pancreatic cancer Neg Hx   . Liver disease Neg Hx   . Inflammatory bowel disease Neg Hx     Social History   Tobacco Use  . Smoking status: Light Tobacco Smoker    Types: Cigars  . Smokeless tobacco: Never Used  Vaping Use  . Vaping Use: Every day  . Substances: CBD  Substance Use Topics  . Alcohol use: No    Comment:    . Drug use: Yes    Types: Marijuana     Comment: States marijuana at 12 midnight    Home Medications Prior to Admission medications   Medication Sig Start Date End Date Taking? Authorizing Provider  oxyCODONE (OXY IR/ROXICODONE) 5 MG immediate release tablet Take 1 tablet (5 mg total) by mouth every 6 (six) hours as needed for breakthrough pain. 03/21/21  Yes Alfredia Client, PA-C  albuterol (VENTOLIN HFA) 108 (90 Base) MCG/ACT inhaler INHALE 2 PUFFS INTO THE LUNGS EVERY 6 (SIX) HOURS AS NEEDED FOR WHEEZING OR SHORTNESS OF BREATH. 11/13/20 11/13/21  Ladell Pier, MD  ALPRAZolam Duanne Moron) 0.25 MG tablet TAKE 1 TABLET BY MOUTH ONCE DAILY AS NEEDED FOR ANXIETY DO NOT DRIVE 04/04/82 04/08/28  Ladell Pier, MD  belladonna-opium (B&O SUPPRETTES) 16.2-30 MG suppository Place 1 suppository rectally every 8 (eight) hours as needed for up to 120 days 03/08/21     calcium carbonate (TUMS - DOSED IN MG ELEMENTAL CALCIUM) 500 MG chewable tablet Chew 1 tablet by mouth daily.     [provider]  cyclobenzaprine (FLEXERIL) 5 MG tablet Take 5 mg by mouth 3 (three) times daily as needed for muscle spasms.    [provider]  diphenhydrAMINE (BENADRYL) 50 MG capsule Take 50 mg by mouth every 6 (six) hours as needed. At night for itching/recent rash on back/shoulder/arms    [provider]  Docusate Sodium (DSS) 100 MG CAPS Take 100 mg by mouth in the morning and at bedtime.    [provider]  lidocaine-prilocaine (EMLA) cream Apply 1 application topically as directed. Apply to port 1 hour prior to stick and cover with plastic wrap 10/31/20   Ladell Pier, MD  loratadine (CLARITIN) 10 MG tablet Take 10 mg by mouth daily as needed.    [provider]  Nitroglycerin (RECTIV) 0.4 % OINT Place 1 application rectally daily.    [provider]  NON FORMULARY Take 2-3 capsules by mouth daily. Turmeric and [place]a    [provider]  Nutritional Supplements (ENSURE PLANT-BASED PROTEIN) LIQD Take by  mouth 2 (two) times daily.  11/24/19   [provider]  ondansetron (ZOFRAN) 8 MG tablet TAKE 1 TABLET (8 MG TOTAL) BY MOUTH EVERY 8 (EIGHT) HOURS AS NEEDED FOR NAUSEA OR VOMITING. 12/03/20 12/03/21  Ladell Pier, MD  pramoxine-hydrocortisone (PROCTOCREAM-HC) 1-1 % rectal cream APPLY 1 APPLICATION RECTALLY 2 (TWO) TIMES DAILY AS NEEDED FOR HEMORRHOIDS OR ANAL ITCHING. 03/04/21 03/04/22  Willia Craze, NP  pregabalin (LYRICA) 25 MG capsule Take 1 capsule (25 mg total) by mouth 2 (two) times daily. 10/19/20   Ladell Pier, MD  prochlorperazine (COMPAZINE) 10 MG tablet Take 10 mg by mouth every 6 (six) hours as needed. 04/26/19   [provider]    Allergies    Bee pollen,  Bee venom, Other, Contrast media [iodinated diagnostic agents], Shrimp [shellfish allergy], Vancomycin, Wheat bran, and Latex  Review of Systems   Review of Systems  Constitutional: Negative for chills, diaphoresis, fatigue and fever.  HENT: Negative for congestion, sore throat and trouble swallowing.   Eyes: Negative for pain and visual disturbance.  Respiratory: Negative for cough, shortness of breath and wheezing.   Cardiovascular: Negative for chest pain, palpitations and leg swelling.  Gastrointestinal: Negative for abdominal distention, abdominal pain, anal bleeding, diarrhea, nausea and vomiting.       Anal pain  Genitourinary: Negative for difficulty urinating.  Musculoskeletal: Positive for arthralgias. Negative for back pain, neck pain and neck stiffness.  Skin: Negative for pallor.  Neurological: Negative for dizziness, speech difficulty, weakness and headaches.  Psychiatric/Behavioral: Negative for confusion.    Physical Exam Updated Vital Signs BP 115/85   Pulse 71   Temp 98.5 F (36.9 C) (Oral)   Resp 18   SpO2 96%   Physical Exam Constitutional:      General: He is not in acute distress.    Appearance: Normal appearance. He is not ill-appearing, toxic-appearing or diaphoretic.   HENT:     Mouth/Throat:     Mouth: Mucous membranes are moist.     Pharynx: Oropharynx is clear.  Eyes:     General: No scleral icterus.    Extraocular Movements: Extraocular movements intact.     Pupils: Pupils are equal, round, and reactive to light.  Cardiovascular:     Rate and Rhythm: Normal rate and regular rhythm.     Pulses: Normal pulses.     Heart sounds: Normal heart sounds.  Pulmonary:     Effort: Pulmonary effort is normal. No respiratory distress.     Breath sounds: Normal breath sounds. No stridor. No wheezing, rhonchi or rales.  Chest:     Chest wall: No tenderness.  Abdominal:     General: Abdomen is flat. There is no distension.     Palpations: Abdomen is soft.     Tenderness: There is no abdominal tenderness. There is no guarding or rebound.     Comments: Abdomen soft   Genitourinary:    Comments: Chaperone present. Digital Rectal exam reveals sphincter with good tone. No external hemorrhoids, masses, or fissures. Stool color is brown with no overt blood. No gross melena.   Musculoskeletal:        General: No swelling or tenderness. Normal range of motion.     Cervical back: Normal range of motion and neck supple. No rigidity.     Right lower leg: No edema.     Left lower leg: No edema.     Comments: Right lower leg with tenderness to calf, no swelling or erythema noted.  No discoloration, normal range of motion to leg with normal leg raise.  Normal strength and sensation to hip, knee and ankle.  Compartments are soft.  DP pulse 2+.  Normal gait.  Skin:    General: Skin is warm and dry.     Capillary Refill: Capillary refill takes less than 2 seconds.     Coloration: Skin is not pale.  Neurological:     General: No focal deficit present.     Mental Status: He is alert and oriented to person, place, and time.  Psychiatric:        Mood and Affect: Mood normal.        Behavior: Behavior normal.     ED Results / Procedures / Treatments  Labs (all labs  ordered are listed, but only abnormal results are displayed) Labs Reviewed  COMPREHENSIVE METABOLIC PANEL - Abnormal; Notable for the following components:      Result Value   Potassium 5.2 (*)    Glucose, Bld 101 (*)    BUN 27 (*)    Creatinine, Ser 1.40 (*)    All other components within normal limits  CBC WITH DIFFERENTIAL/PLATELET - Abnormal; Notable for the following components:   Abs Immature Granulocytes 0.13 (*)    All other components within normal limits    EKG None  Radiology VAS [place] LOWER EXTREMITY VENOUS (DVT) (ONLY [facility] & [facility] 7a-7p)  Result Date: 03/21/2021  Lower Venous DVT Study Patient Name:  ZAXTON ANGERER  Date of Exam:   03/21/2021 Medical Rec #: [id number]   Accession #:    [id number] Date of Birth: June 06, 1971  Patient Gender: M Patient Age:   49Y Exam Location:  [facility] Procedure:      VAS [place] LOWER EXTREMITY VENOUS (DVT) Referring Phys: [id number] [facility] [place] --------------------------------------------------------------------------------  Indications: Pain.  Comparison Study: no prior Performing Technologist: Abram Sander RVS  Examination Guidelines: A complete evaluation includes B-mode imaging, spectral Doppler, color Doppler, and power Doppler as needed of all accessible portions of each vessel. Bilateral testing is considered an integral part of a complete examination. Limited examinations for reoccurring indications may be performed as noted. The reflux portion of the exam is performed with the patient in reverse Trendelenburg.  +---------+---------------+---------+-----------+----------+--------------+ RIGHT    CompressibilityPhasicitySpontaneityPropertiesThrombus Aging +---------+---------------+---------+-----------+----------+--------------+ CFV      Full           Yes      Yes                                 +---------+---------------+---------+-----------+----------+--------------+ SFJ      Full                                                         +---------+---------------+---------+-----------+----------+--------------+ FV Prox  Full                                                        +---------+---------------+---------+-----------+----------+--------------+ FV Mid   Full                                                        +---------+---------------+---------+-----------+----------+--------------+ FV DistalFull                                                        +---------+---------------+---------+-----------+----------+--------------+ PFV      Full                                                        +---------+---------------+---------+-----------+----------+--------------+  POP      Full           Yes      Yes                                 +---------+---------------+---------+-----------+----------+--------------+ PTV      Full                                                        +---------+---------------+---------+-----------+----------+--------------+ PERO     Full                                                        +---------+---------------+---------+-----------+----------+--------------+   +----+---------------+---------+-----------+----------+--------------+ LEFTCompressibilityPhasicitySpontaneityPropertiesThrombus Aging +----+---------------+---------+-----------+----------+--------------+ CFV Full           Yes      Yes                                 +----+---------------+---------+-----------+----------+--------------+     Summary: RIGHT: - There is no evidence of deep vein thrombosis in the lower extremity.  - No cystic structure found in the popliteal fossa.  LEFT: - No evidence of common femoral vein obstruction.  *See table(s) above for measurements and observations. Electronically signed by Servando Snare MD on 03/21/2021 at 3:32:46 PM.    Final     Procedures Procedures   Medications Ordered in ED Medications  sodium chloride 0.9 % bolus 1,000  mL (0 mLs Intravenous Stopped 03/21/21 1600)  oxyCODONE (Oxy IR/ROXICODONE) immediate release tablet 5 mg (5 mg Oral Given 03/21/21 1352)    ED Course  I have reviewed the triage vital signs and the nursing notes.  Pertinent labs & imaging results that were available during my care of the patient were reviewed by me and considered in my medical decision making (see chart for details).    MDM Rules/Calculators/A&P                          Bradley Colon is a 50 y.o. male w/ past medical history of hemorrhoids in the setting of metastatic rectal cancer on active therapy followed by [facility] oncology that presents to the emergency department today for ongoing fissure and concerns for dehydration.  Digital rectal exam with good sphincter tone, does not reveal any internal hemorrhoids, do not feel a fissure.  No blood.  Patient is nontoxic-appearing, appears very well.  In regards to dehydration, patient does not appear dry however creatinine is elevated to 1.4, baseline 1.16.  BUN slightly elevated, most likely prerenal.  CBC unremarkable.  Will give IV fluids and reevaluate.  Upon reevaluation, patient states that he feels much better than when he came in, is ready for discharge at this time.  Pain management discussed in regards to pain management clinic.  Patient will follow up with PCP for pain management.  We will also give short-term of oxycodone here today, patient does not want the Tylenol and the oxycodone, states that causes his stomach to hurt.  Was able to review  PDMP, patient has not received oxycodone since last year.  In regards to potassium of 5.2, this most likely because patient is taking over-the-counter potassium and magnesium pills, did discuss that patient needs to stop this.  In regards to elevated creatinine, will have patient recheck this with PCP in the next couple of days.  DVT study negative.  Patient most likely having cramps in this area, patient agrees states that they normally move  around.  Patient will follow up with his oncologist and his GI doctor in regards to his anal fissure.  Patient agreeable with plan, to be discharged at this time.  Doubt need for further emergent work up at this time. I explained the diagnosis and have given explicit precautions to return to the ER including for any other new or worsening symptoms. The patient understands and accepts the medical plan as it's been dictated and I have answered their questions. Discharge instructions concerning home care and prescriptions have been given. The patient is STABLE and is discharged to home in good condition.  I discussed this case with my attending physician who cosigned this note including patient's presenting symptoms, physical exam, and planned diagnostics and interventions. Attending physician stated agreement with plan or made changes to plan which were implemented.     Final Clinical Impression(s) / ED Diagnoses Final diagnoses:  Anal or rectal pain    Rx / DC Orders ED Discharge Orders         Ordered    oxyCODONE (OXY IR/ROXICODONE) 5 MG immediate release tablet  Every 6 hours PRN        03/21/21 [place], [place], PA-C 03/21/21 [place], [place], DO 03/21/21 1622

## 2021-03-21 NOTE — ED Triage Notes (Signed)
Pt here with reports of possible fissure. Pt initially thought it was hemorrhoids and tx for such with no improvement. Pt surgeon sent him here for evaluation since it has not healed. Pt also concerned for dehydration.

## 2021-03-21 NOTE — Progress Notes (Signed)
Lower extremity venous has been completed.   Preliminary results in CV Proc.   Abram Sander 03/21/2021 1:59 PM

## 2021-03-21 NOTE — ED Provider Notes (Signed)
Emergency Medicine Provider Triage Evaluation Note  Bradley Colon , a 50 y.o. male  was evaluated in triage. PT with history of rectal cancer with resection. Followed by Truitt Leep from [place]. Sent here for severe pain and "charley horses" for 3 days. No fevers, chills. Has outpatient MRI pending. Wants tp get to the "bottom of it", no loss of bowel or bladder control.   Review of Systems  Positive: As above   Negative: As above   Physical Exam  BP (!) 130/103 (BP Location: Left Arm)   Pulse 100   Temp 98.5 F (36.9 C) (Oral)   Resp 17   SpO2 98%  Gen:   Awake, no distress   Resp:  Normal effort  MSK:   Moves extremities without difficulty  Other:    Medical Decision Making  Medically screening exam initiated at 12:16 PM.  Appropriate orders placed.  Bradley Colon was informed that the remainder of the evaluation will be completed by another provider, this initial triage assessment does not replace that evaluation, and the importance of remaining in the ED until their evaluation is complete.     Garald Balding, PA-C 03/21/21 [place], DO 03/21/21 1401

## 2021-03-22 ENCOUNTER — Encounter: Payer: Self-pay | Admitting: *Deleted

## 2021-03-23 MED ORDER — PREDNISONE 10 MG (21) PO TBPK: ORAL_TABLET | ORAL | 0 refills | Status: DC

## 2021-03-23 NOTE — ED Notes (Cosign Needed)
I saw this patient yesterday for anal fissure, patient calling in today asking nurse for oral steroid prescription, states this is helped him before with this problem.  Did discuss with patient that topical would be more beneficial, patient states that he started this in the past and it has not helped.  Patient states that prednisone is the only thing that helps when he has an acute flare with his fissure.  Ran this case by Dr. Eulis Foster who is agreeable with this plan at this time.  Strict precautions given.  Patient has an upcoming appointment with his doctor on Monday.   Alfredia Client, PA-C 03/23/21 1317

## 2021-03-25 ENCOUNTER — Other Ambulatory Visit: Payer: Self-pay

## 2021-03-25 ENCOUNTER — Inpatient Hospital Stay: Payer: Medicaid Other | Attending: Oncology | Admitting: Oncology

## 2021-03-25 VITALS — BP 127/77 | HR 72 | Temp 98.2°F | Resp 18 | Ht 67.0 in | Wt 211.8 lb

## 2021-03-25 DIAGNOSIS — Z95828 Presence of other vascular implants and grafts: Secondary | ICD-10-CM

## 2021-03-25 DIAGNOSIS — C20 Malignant neoplasm of rectum: Secondary | ICD-10-CM | POA: Diagnosis not present

## 2021-03-25 DIAGNOSIS — R918 Other nonspecific abnormal finding of lung field: Secondary | ICD-10-CM | POA: Diagnosis not present

## 2021-03-25 DIAGNOSIS — G893 Neoplasm related pain (acute) (chronic): Secondary | ICD-10-CM | POA: Insufficient documentation

## 2021-03-25 DIAGNOSIS — C787 Secondary malignant neoplasm of liver and intrahepatic bile duct: Secondary | ICD-10-CM | POA: Diagnosis not present

## 2021-03-25 MED ORDER — HEPARIN SOD (PORK) LOCK FLUSH 100 UNIT/ML IV SOLN
500.0000 [IU] | Freq: Once | INTRAVENOUS | Status: AC
  Administered 2021-03-25: 500 [IU]
  Filled 2021-03-25: qty 5

## 2021-03-25 MED ORDER — SODIUM CHLORIDE 0.9% FLUSH
10.0000 mL | Freq: Once | INTRAVENOUS | Status: AC
  Administered 2021-03-25: 10 mL
  Filled 2021-03-25: qty 10

## 2021-03-25 MED ORDER — OXYCODONE HCL 10 MG PO TABS: 10.0000 mg | ORAL_TABLET | Freq: Two times a day (BID) | ORAL | 0 refills | Status: DC | PRN

## 2021-03-25 NOTE — Progress Notes (Signed)
[place] OFFICE PROGRESS NOTE   Diagnosis: Rectal cancer  INTERVAL HISTORY:   Mr. Vigo completed a cycle of FOLFOX on 01/21/2021.  He reports prolonged malaise following this cycle of chemotherapy.  He did not return for scheduled follow-up.  He complains of increased pain at the "rectum ", different from the perineal pain he experienced over the past year.  Good appetite.  He reports frequent urination.  He has been evaluated by Dr. Glennon Mac in surgical oncology at [facility], last seen on 03/08/2021.  He underwent repeat rectal examination on 03/08/2021.  This revealed reasonable sphincter tone, no masses, anastomosis at 3 cm.  No hemorrhoids.  No evidence of recurrent disease.  He was diagnosed with low anterior resection syndrome.  It was recommended he use BNO suppositories, lidocaine gel, and pelvic physical therapy.  He reports his insurance would not pay for the BNO suppository.  He was seen in the emergency room with rectal pain on 03/21/2021.  A digital rectal exam revealed good sphincter tone, no hemorrhoids, masses, or fissures.  Stool was brown.  He was given a short course of oxycodone and a prednisone taper.  He reports partial improvement in his pain with this regimen.  No dyspnea.  He has not been taking apixaban for several months.  Objective:  Vital signs in last 24 hours:  Blood pressure 127/77, pulse 72, temperature 98.2 F (36.8 C), temperature source Oral, resp. rate 18, height _0  (1.702 m), weight 211 lb 12.8 oz (96.1 kg), SpO2 98 %.   Lymphatics: No cervical, supraclavicular, or inguinal nodes Resp: Decreased breath sounds of right lower posterior chest, no respiratory distress Cardio: Regular rate and rhythm GI: No hepatosplenomegaly, no mass, nontender Vascular: No leg edema Neuro: Foot strength is intact bilaterally  Portacath/PICC-without erythema  Lab Results:  Lab Results  Component Value Date   WBC 7.2 03/21/2021   HGB 15.8 03/21/2021   HCT  48.1 03/21/2021   MCV 91.3 03/21/2021   PLT 331 03/21/2021   NEUTROABS 4.2 03/21/2021    CMP  Lab Results  Component Value Date   NA 135 03/21/2021   K 5.2 (H) 03/21/2021   CL 103 03/21/2021   CO2 22 03/21/2021   GLUCOSE 101 (H) 03/21/2021   BUN 27 (H) 03/21/2021   CREATININE 1.40 (H) 03/21/2021   CALCIUM 9.5 03/21/2021   PROT 7.4 03/21/2021   ALBUMIN 4.0 03/21/2021   AST 29 03/21/2021   ALT 35 03/21/2021   ALKPHOS 67 03/21/2021   BILITOT 1.1 03/21/2021   GFRNONAA >60 03/21/2021   GFRAA >60 01/02/2020    Lab Results  Component Value Date   CEA1 12.74 (H) 01/21/2021    Medications: I have reviewed the patient's current medications.   Assessment/Plan: 1. Rectal cancer, clinical stage III (T3cN2), Foundation 1 on lymph node 11/18/2019-tumor mutation burden 1, FG R1 amplification, K-ras wild-type, microsatellite status could not be determined ? Rectal mass extending from 4-9 cm on colonoscopy 02/15/2019 with a biopsy confirming invasive adenocarcinoma ? CTs 02/23/2019-thickening of the low rectum without a discrete mass, small bilateral pulmonary nodules, some present on imaging from 2014 ? Pelvic MRI 02/25/2019-T3cN2 tumor beginning at 4.9 cm from the anal verge and 0 cm from the internal anal sphincter, tumor extends through the muscularis propria, 2. Rectal lymph nodes and 2 enlarged right internal iliac nodes ? CTs at [facility] 04/19/2019-new subcentimeter right hepatic lesion concerning for metastasis, stable low rectal mass with prominent mesorectal lymph nodes, stable pathologic enlarged right  internal iliac nodes, stable pulmonary nodules ? Xeloda/radiation beginning 04/26/2019, completed 07/05/2019 ? CT 07/26/2019-growth of previously described right hepatic lesion, slight decrease in pelvic lymphadenopathy and rectal thickening ? 08/05/2019-week 1 bolus 5-FU/leucovorin (200 mg/m) ? 08/12/2019-week to bolus 5-FU/leucovorin (400 mg/m) ? 10/16/ 2020-week 3 bolus 5-FU/leucovorin  (400 mg/m) and oxaliplatin (42.5 mg/m) ? CT chest 10/04/2019-slight increase in bilateral pulmonary nodules concerning for metastases ? MRI abdomen 10/06/2019-right hepatic lesion consistent with metastasis, wall thickening of the low anterior rectum, no new or enlarging adenopathy, stable mesorectal and right internal iliac nodes ? PET scan 10/14/2019-pulmonary nodules below PET resolution, hypermetabolic right posterior hepatic lesion, mildly hypermetabolic partially calcified right internal iliac chain lymphadenopathy, mild gastric hypermetabolism and thickening-likely gastritis ? 11/18/2019-LAR with diverting loop ileostomy and ablation of solitary liver metastasis (2 cm segment 7), moderately differentiated adenocarcinoma invading into soft tissue, LVI present, perineural invasion present, 5/15 lymph nodes positive, 4 tumor deposits, absent treatment effect-score 3,ypT3 ypN2a, mismatch repair protein expression intact, MSS ? CT chest 01/16/2020-increase in the size of a dominant right lower lobe nodule, unchanged left upper lobe and left lower lobe nodules ? CT chest 04/11/2020-enlargement of bilateral lung nodules, and new nodules ? CT chest 09/11/2020-enlargement of bilateral lung nodules, aggressive medial right liver lesion ? Cycle 1 FOLFOX 10/31/2020 ? Cycle 2 FOLFOX 11/14/2020 ? Cycle 3 FOLFOX 11/29/2020 ? CT chest 01/03/2021-multiple bilateral lung nodules, stable to slightly decreased in size, no new lesions, hepatic dome lesion is smaller (ablated lesion?) ? Cycle 4 FOLFOX 01/21/2021 2. Pain secondary to #1, improved 3. Reactive airways disease/asthma 4. Report of multiple allergens 5. Port-A-Cath related DVT, right IJ, 09/30/2019-apixaban, converted to Lovenox surrounding surgery January 2021, changed back to apixaban 12/30/2019 6. Erectile dysfunction following treatment for rectal cancer-followed by urology 7. Rectal/perineal pain following the low anterior  resection-persistent  Disposition: Mr Kluever was lost to follow-up after he was last treated with FOLFOX in March.  He has metastatic rectal cancer.  His care has been irregular and delayed secondary to fragmentation and noncompliance.  He does not wish to resume FOLFOX at present.  He complains of rectal pain.  He also has pain involving the penis and right leg.  The symptoms have been intermittently present since he underwent rectal surgery last year.  I plan to communicate with the surgical oncology service at [facility] regarding a management plan.  I gave him a prescription for oxycodone to use twice daily as needed.  He has been followed by the pain clinic at [facility].  He requested a referral to gastroenterology here, but I do not think it is likely he would benefit from further GI evaluation at present.  He is undergone repeat rectal examinations and no correctable etiology for his symptoms has been documented.  He agrees to return for an office visit with the plan to resume in 4 weeks.  The Port-A-Cath was flushed today.  We will ask him to return for a chemistry panel later this week.  Betsy Coder, MD  03/25/2021  4:02 PM

## 2021-03-26 ENCOUNTER — Other Ambulatory Visit (HOSPITAL_COMMUNITY): Payer: Self-pay

## 2021-03-27 ENCOUNTER — Telehealth: Payer: Self-pay | Admitting: *Deleted

## 2021-03-27 ENCOUNTER — Telehealth: Payer: Self-pay

## 2021-03-27 NOTE — Telephone Encounter (Addendum)
Per Dr. Benay Spice: Spoke w/Dr. Glennon Mac at [facility] and she reports that there is nothing that can be done surgically for his pain. She also suggests outpatient palliative team referral w/Dr. Hilma Favors for pain management.  Email sent to Dr. Hilma Favors w/palliative team requesting referral. Patient notified and encouraged to answer phone for unfamiliar number in case Dr. Delanna Ahmadi staff is trying to reach him.

## 2021-03-27 NOTE — Telephone Encounter (Signed)
TC to Pt to inform him Dr Benay Spice would like him to come in for lab work left v/m message for him to return call to schedule lab visit.

## 2021-03-27 NOTE — Telephone Encounter (Signed)
-----   Message from Ladell Pier, MD sent at 03/25/2021  5:23 PM EDT ----- Needs to return for CMP later this week to follow-up on elevated potassium and creatinine noted in the emergency room last week

## 2021-04-04 ENCOUNTER — Ambulatory Visit (INDEPENDENT_AMBULATORY_CARE_PROVIDER_SITE_OTHER): Payer: Medicaid Other | Admitting: Internal Medicine

## 2021-04-04 ENCOUNTER — Encounter: Payer: Self-pay | Admitting: Internal Medicine

## 2021-04-04 ENCOUNTER — Other Ambulatory Visit: Payer: Self-pay

## 2021-04-04 VITALS — BP 156/84 | HR 82 | Temp 97.9°F | Resp 16 | Wt 205.0 lb

## 2021-04-04 DIAGNOSIS — C20 Malignant neoplasm of rectum: Secondary | ICD-10-CM

## 2021-04-04 DIAGNOSIS — R7989 Other specified abnormal findings of blood chemistry: Secondary | ICD-10-CM

## 2021-04-04 DIAGNOSIS — K6289 Other specified diseases of anus and rectum: Secondary | ICD-10-CM

## 2021-04-04 DIAGNOSIS — E875 Hyperkalemia: Secondary | ICD-10-CM

## 2021-04-04 DIAGNOSIS — M79604 Pain in right leg: Secondary | ICD-10-CM | POA: Diagnosis not present

## 2021-04-04 NOTE — Progress Notes (Signed)
Subjective:    Bradley Colon - 50 y.o. male MRN [id number]  Date of birth: 03-26-1971  HPI  Bradley Colon is here for concerns about right leg pain and rectal pain. Patient has a history of hemorrhoids in setting of metastatic rectal cancer on active therapy with [facility]. Was seen at [facility] ER on 5/19 for similar complaints, at that time also concerned about rectal fissure and dehydration. Rectal exam at the time was unremarkable.    Last surgery was in August 2021 when they took down ileostomy bag and reversed it. Patient started chemotherapy and reports that his last session was March 23. Patient reports that every time he starts chemotherapy he gets "hemorrhoid" or something similar. He has been to pelvic floor PT and his oncologist wants him to return for biofeedback. He feels like he can't do this due to the significant amount of pain that he is in. He reports that he has taken a whole bunch of pain medications and nothing helped him except Gabapentin. But he is scared to take that medication because he had trouble coming off the medication. He can do enema and clear the spot and it feels better. He has been referred to pain management.      Health Maintenance:  Health Maintenance Due  Topic Date Due  . COVID-19 Vaccine (1) Never done  . Hepatitis C Screening  Never done  . TETANUS/TDAP  Never done  . Zoster Vaccines- Shingrix (1 of 2) Never done  . COLONOSCOPY (Pts 45-77yrs Insurance coverage will need to be confirmed)  02/17/2020    -  reports that he has been smoking cigars. He has never used smokeless tobacco. - Review of Systems: Per HPI. - Past Medical History: Patient Active Problem List   Diagnosis Date Noted  . Goals of care, counseling/discussion 10/25/2020  . Port-A-Cath in place 04/12/2020  . Rectal cancer ([place]) 03/02/2019  . Reactive airway disease 03/02/2019  . Vegetarian 03/02/2019  . Adjustment disorder with mixed anxiety and depressed mood 03/02/2019  .  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 Change in bowel habits 01/31/2019  . Constipation 01/31/2019  . Grade III hemorrhoids 01/31/2019  . Rectal pain 01/31/2019  . Fibromyalgia 12/18/2017  . Dysesthesia affecting both sides of body 12/18/2017  . Spondylosis without myelopathy or radiculopathy, lumbar region 12/18/2017  . Chronic post-traumatic stress disorder (PTSD) 05/25/2017  . Other fatigue 07/27/2016  . Chronic left shoulder pain 07/22/2016  . Chronic neck pain 11/19/2015  . Chronic low back pain 09/18/2014   - Medications: reviewed and updated   Objective:   Physical Exam BP (!) 156/84   Pulse 82   Temp 97.9 F (36.6 C)   Resp 16   Wt 205 lb (93 kg)   SpO2 95%   BMI 32.11 kg/m  Physical Exam Constitutional:      Comments: Patient angry, appears to be in pain, pacing exam room.   Cardiovascular:     Rate and Rhythm: Normal rate.  Pulmonary:     Effort: Pulmonary effort is normal. No respiratory distress.  Musculoskeletal:        General: Normal range of motion.  Skin:    General: Skin is warm.  Neurological:     Mental Status: He is alert and oriented to person, place, and time.  Psychiatric:        Mood and Affect: Affect normal.        Judgment: Judgment normal.  Assessment & Plan:   1. Rectal cancer ([place]) 2. Right leg pain 3. Rectal pain Patient has very complicated history followed by multiple specialities. Patient has never been seen by this provider and was last seen in clinic >2 years ago. Reviewed notes from specialists. Patient is under the belief that his oncologist and surgeon are lying to him about lack of findings on rectal exam. Reviewed that he has had fragmented care and by non-compliant with oncology. Oncology did prescribe narcotic for his rectal pain. He was seen by GI and noted to have internal hemorrhoids at beginning of May. Did not believe this was etiology of his significant pain. Did proceed with treatment with  topical steroid cream. Surgeon note from 5/6 states symptoms consistent with low anterior resection syndrome and not from hemorrhoids. Supportive care measures were discussed. Pelvic floor PT with biofeedback was recommended. Discussed these reviewed notes with patient who became frustrated and felt that his other providers had "messed up surgery" and were not telling him the truth. Explained to him that I am new to his care and would be unable to tell on exam if there was a complication from his resection. Discussed unable to provide narcotic pain medications per clinic policy. Offered pain management referral, he declined.    4. Elevated serum creatinine Noted to have Cr 1.4 on 5/19 without prior abnormality of kidney function. Monitor.  - Basic Metabolic Panel  5. Hyperkalemia Mild hyperkalemia also noted at that time with result of 5.2. Reportedly had been taking OTC K pills at that time and was told to d/c. Monitor.  - Basic Metabolic Panel    Phill Myron, D.O. 04/04/2021, 10:08 AM Primary Care at [facility]

## 2021-04-04 NOTE — Progress Notes (Signed)
Has had right leg pain  Pain worse after surgery 2 mos. Ago  Pain in rectum and right leg. Feels like it's pulling in scrotum.   Frequent urination  Feels better when he pass gas or have BM

## 2021-04-05 ENCOUNTER — Encounter: Payer: Self-pay | Admitting: Oncology

## 2021-04-05 LAB — BASIC METABOLIC PANEL
BUN/Creatinine Ratio: 10 (ref 9–20)
BUN: 15 mg/dL (ref 6–24)
CO2: 24 mmol/L (ref 20–29)
Calcium: 9.8 mg/dL (ref 8.7–10.2)
Chloride: 102 mmol/L (ref 96–106)
Creatinine, Ser: 1.43 mg/dL — ABNORMAL HIGH (ref 0.76–1.27)
Glucose: 82 mg/dL (ref 65–99)
Potassium: 5.2 mmol/L (ref 3.5–5.2)
Sodium: 143 mmol/L (ref 134–144)
eGFR: 59 mL/min/{1.73_m2} — ABNORMAL LOW (ref >59)

## 2021-04-08 ENCOUNTER — Encounter: Payer: Self-pay | Admitting: Oncology

## 2021-04-09 ENCOUNTER — Other Ambulatory Visit: Payer: Self-pay

## 2021-04-09 ENCOUNTER — Inpatient Hospital Stay: Payer: Medicaid Other

## 2021-04-09 ENCOUNTER — Other Ambulatory Visit: Payer: Self-pay | Admitting: Nurse Practitioner

## 2021-04-09 ENCOUNTER — Inpatient Hospital Stay: Payer: Medicaid Other | Attending: Oncology

## 2021-04-09 VITALS — BP 134/83 | HR 71 | Resp 17

## 2021-04-09 DIAGNOSIS — C20 Malignant neoplasm of rectum: Secondary | ICD-10-CM

## 2021-04-09 DIAGNOSIS — Z95828 Presence of other vascular implants and grafts: Secondary | ICD-10-CM

## 2021-04-09 LAB — CMP (CANCER CENTER ONLY)
ALT: 25 U/L (ref 0–44)
AST: 23 U/L (ref 15–41)
Albumin: 3.9 g/dL (ref 3.5–5.0)
Alkaline Phosphatase: 68 U/L (ref 38–126)
Anion gap: 8 (ref 5–15)
BUN: 17 mg/dL (ref 6–20)
CO2: 27 mmol/L (ref 22–32)
Calcium: 9.6 mg/dL (ref 8.9–10.3)
Chloride: 104 mmol/L (ref 98–111)
Creatinine: 1.3 mg/dL — ABNORMAL HIGH (ref 0.61–1.24)
GFR, Estimated: >60 mL/min (ref >60)
Glucose, Bld: 82 mg/dL (ref 70–99)
Potassium: 4.4 mmol/L (ref 3.5–5.1)
Sodium: 139 mmol/L (ref 135–145)
Total Bilirubin: 0.7 mg/dL (ref 0.3–1.2)
Total Protein: 6.5 g/dL (ref 6.5–8.1)

## 2021-04-09 MED ORDER — HEPARIN SOD (PORK) LOCK FLUSH 100 UNIT/ML IV SOLN
500.0000 [IU] | Freq: Once | INTRAVENOUS | Status: DC
  Filled 2021-04-09: qty 5

## 2021-04-09 MED ORDER — SODIUM CHLORIDE 0.9% FLUSH
10.0000 mL | Freq: Once | INTRAVENOUS | Status: DC
  Filled 2021-04-09: qty 10

## 2021-04-10 ENCOUNTER — Encounter: Payer: Self-pay | Admitting: Oncology

## 2021-04-10 ENCOUNTER — Telehealth: Payer: Self-pay

## 2021-04-10 NOTE — Telephone Encounter (Signed)
My chart message from Pt stating he received a message about his creatinine level and was never informed about this. Informed Pt that after his last visit to see Dr Benay Spice a v/m message was left informing him that his potassium level and creatinine level was up and Dr Benay Spice wanted him to return for follow up labs. Informed Pt that several calls were made to him which he did not return the calls. Pt came in on 04/09/21 for labs in which Lattie Haw T NP reviewed labs and stated to get in contact with Pt to let him know his potassium level is normal but creatinine is lower but still a little elevated. Informed Pt that Dr. Benay Spice would like for him to have another Ct without contrast for restaging. Pt refused CT Scan and stated that he is almost out of pain medication. Pt stated he has been taking the medication 4 times a day I explained to the Pt that the prescription was written to take medication every 12 hours and the pharmacy will not refill this medication until that calculation of medication's  time to be refilled Explained to Pt that his prescription was given on 03/25/21 and the pharmacy will not refill the medication  to refill the medication. Pt did not receive this information well. Pt was offered pain management clinic.

## 2021-04-11 ENCOUNTER — Encounter: Payer: Self-pay | Admitting: Oncology

## 2021-04-11 ENCOUNTER — Telehealth: Payer: Self-pay

## 2021-04-11 NOTE — Telephone Encounter (Signed)
Spoke with patient regarding scheduling a Palliative care consult. Patient was discussing his rectal pain and pain medication before I could even discuss Palliative care services with him.   He states has reached out to Dr. Learta Codding for pain medication, but was denied.   Patient became very upset when I told him what Palliative care was and he said that the hospital is just sending referrals to random agencies that he does not need. He screamed he needed to get out of pain and hung the phone up on me.  Will notify Dr. Learta Codding on 6/10. The office is now closed. Will not close referral until discussed with Dr Carin Hock office.

## 2021-04-12 ENCOUNTER — Encounter: Payer: Self-pay | Admitting: *Deleted

## 2021-04-12 ENCOUNTER — Other Ambulatory Visit: Payer: Self-pay | Admitting: Internal Medicine

## 2021-04-12 ENCOUNTER — Telehealth: Payer: Self-pay | Admitting: General Practice

## 2021-04-12 ENCOUNTER — Telehealth: Payer: Self-pay | Admitting: *Deleted

## 2021-04-12 ENCOUNTER — Other Ambulatory Visit (HOSPITAL_COMMUNITY): Payer: Self-pay

## 2021-04-12 ENCOUNTER — Encounter: Payer: Self-pay | Admitting: General Practice

## 2021-04-12 MED ORDER — HYDROMORPHONE HCL 4 MG PO TABS
4.0000 mg | ORAL_TABLET | ORAL | 0 refills | Status: DC | PRN
  Filled 2021-04-12: qty 30, 5d supply, fill #0

## 2021-04-12 MED ORDER — PREDNISONE 10 MG (21) PO TBPK: ORAL_TABLET | ORAL | 0 refills | Status: DC

## 2021-04-12 MED ORDER — BACLOFEN 10 MG PO TABS
10.0000 mg | ORAL_TABLET | Freq: Three times a day (TID) | ORAL | 0 refills | Status: DC | PRN
  Filled 2021-04-12: qty 30, 10d supply, fill #0

## 2021-04-12 NOTE — Telephone Encounter (Signed)
[facility] CSW Progress Notes  Communication from Dr Hilma Favors, she has reviwed chart and called in medication for patient to [place] as it is not available at [facility].  CSW called patient, informed him of this, asked him to return CSW call to confirm change of pharmacy.  Edwyna Shell, LCSW Clinical Social Worker Phone:  [phone]

## 2021-04-12 NOTE — Progress Notes (Signed)
Call from [place], DON at [facility]. Patient sent several My Chart messages describing frustration w current, ongoing, significant pain secondary to his rectal cancer.  Advised RN to call patient - CSW also spoke w patient by phone as he was on way to appt w surgical oncologist (Dr Glennon Mac [phone]).  PAtient described history of chronic pain secondary to workplace accident when boat and crane fell 30 feet onto his shoulder/neck in 2013.  He rehabbed successfully from this accident, is on disability as a result due to loss of function from the injuries sustained.  Subsequently, he was diagnosed w rectal cancer, has had surgery and chemotherapy. Initially after surgery for rectal cancer, he was able to get to a state of manageable pain.  However, upon initiation of chemotherapy, he became dehydrated and constipated.  He points to an event several months ago where he strained to have a bowel movement and immediately experienced excruciating pain.  He also refers to a "sore" which may have "moved."  He believes this sore is the source of his ongoing pain - it becomes irritated "every time I have a BM, I end up in excruciating pain for the rest of the day."  He has not felt "listened to" by various medical providers, feels like the sore needs to be investigated.  He does not want to be on opioid medications "forever", but also feels that his current pain regimen is inadequate to control pain he is experiencing.     Called Dr Marisue Brooklyn office as patient has a 12 noon appointment there today.  Stressed the urgency of patient's need to both be "heard" and to receive some relief from pain.  Spoke w Suan Halter [phone]).   She will relay message to team - patient has been irritable and impulsive (hung up on Palliative Medicine scheduler yesterday).  Informed them that patient has sent multiple messages to our facility begging for greater pain control.  Asked Isidoro Donning to provide her contact information to this  facility so they can coordinate any needed referrals as plan is put in place to address patient's needs/concerns.   Edwyna Shell, LCSW Clinical Social Worker Phone:  [phone]

## 2021-04-12 NOTE — Telephone Encounter (Signed)
Retrieved VM left by patient on 04/11/21 at 1615: Reports he is out of pain medication and that he is in extreme pain and no one is managing his pain. States "I don't deserve this". Feels no one cares about his pain or can fix it. States that the [facility] MD does not want to fix it and that he has been bedridden for 3 months. Asking if he is supposed to sit in bed the rest of his life in (cursing) pain? Oxycodone that Dr. Benay Spice ordered only lasts 6 hours--having to apply ice/heat and taking gabapentin again for the pain. Expresses thought "I didn't want to have that surgery, it was forced on me".

## 2021-04-12 NOTE — Telephone Encounter (Signed)
Message from [facility] reporting she called patient on 6/9 to set up 1st visit by Palliative Team. Patient became agitated and screamed about his pain and hung up on her. Returned call to [employer] and left message that our CSW has been notified of his recent messages and mental state.

## 2021-04-12 NOTE — Telephone Encounter (Signed)
Patient saw Dr. Glennon Mac, surgical oncologist @ [facility], today. Per patient, her recommendations were to do a tap water enema daily in the mornings, apply a licodaine + (another drug-not sure of the name) ointment to scar tissue inside of rectal area 6xs a day, be referred to a pain mgmt group that will prescribe pain meds, and to work through Dr. Donzetta Matters Dimon in [place] for a nerve block. Patient stated that Dr. Glennon Mac will contact Hildred Alamin Regulatory affairs officer of this note) today to discuss future care.   Patient utilizes the pelvic floor therapy exercises when in a pain crisis and notes that it helps some. Patient did not want to utilize [facility] pallliative care because they were only going to provide emotional support through the pain journey and would not prescribe pain meds.   Edwyna Shell LCSW to reach out to Dr. Rhea Pink with pain mgmt to see if she can add anything to assist in this patient's pain mgmt. Anne and I will set times for [place] weekly to reach out to patient to ensure that he is moving in a positive direction. Webb Silversmith due to call patient next Friday 6/17. Patient agreeable. We will both reach out to patient sooner if we have new information to give to patient. Patient verbalized understanding.   Patient was very appreciative of Webb Silversmith and me reaching out to him and assisting him with his pain mgmt issues.

## 2021-04-12 NOTE — Progress Notes (Signed)
[facility] CSW Progress Notes  Call from Dr Jason Coop, pain management specialist.  She had previously received referral on patient and had not been able to schedule him for visit at [facility] yet.  Due to the need, she will see patient on Monday June 13 at 10 AM for a virtual visit.  Dr Hilma Favors has reviewed chart and is familiar with patient.  Patient informed of upcoming virtual visit and is appreciative.  Edwyna Shell, LCSW Clinical Social Worker Phone:  [phone]

## 2021-04-13 NOTE — Progress Notes (Signed)
Call received from Edwyna Shell, [place] re: Bradley Colon. This gentleman has been experiencing severe pain in the setting of metastatic rectal cancer and  experiencing symptoms related to low anterior resection syndrome following surgery for resection of rectal cancer about a year ago. He is being followed by Dr. Benay Spice for med oncology and his last treatment was back in March with FOLFOX.  CSW requested urgent palliative care evaluation in setting of uncontrolled pain and suffering.  I have reviewed his record in detail and have scheduled him for a virtual visit at my earliest available time on Monday 6/13. Given that we are approaching the weekend and his levels of pain and suffering have been so intense I have agreed to send in medications until I can evaluate him on Monday.  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 This pain can be extremely severe and debilitating often there is no confirmatory testing with imaging or other findings.  Will call in the following:  Baclofen for Rectal Spasm and Levator Ani Pain I reviewed his PDMP-he has been on oxycodone for years -he likely has tolerance and an opioid rotation would be most beneficial. Will send in script for 5 day supply of hydromorphone 4mg  q4 prn for pain.  Will follow up with him on Monday and discuss pain management options and how we can manage this moving forward.  CSW has updated patient on this plan and on his appt Monday.  Lane Hacker, DO Palliative Medicine

## 2021-04-15 ENCOUNTER — Emergency Department (HOSPITAL_COMMUNITY)
Admission: EM | Admit: 2021-04-15 | Discharge: 2021-04-15 | Disposition: A | Discharge status: Home / Self Care | Payer: Medicaid Other | Attending: Emergency Medicine | Admitting: Emergency Medicine

## 2021-04-15 ENCOUNTER — Other Ambulatory Visit: Payer: Self-pay

## 2021-04-15 ENCOUNTER — Encounter (HOSPITAL_COMMUNITY): Payer: Self-pay | Admitting: *Deleted

## 2021-04-15 ENCOUNTER — Other Ambulatory Visit: Payer: Self-pay | Admitting: Internal Medicine

## 2021-04-15 DIAGNOSIS — Z5321 Procedure and treatment not carried out due to patient leaving prior to being seen by health care provider: Secondary | ICD-10-CM | POA: Diagnosis not present

## 2021-04-15 DIAGNOSIS — K6289 Other specified diseases of anus and rectum: Secondary | ICD-10-CM | POA: Diagnosis not present

## 2021-04-15 LAB — LACTIC ACID, PLASMA: Lactic Acid, Venous: 1 mmol/L (ref 0.5–1.9)

## 2021-04-15 LAB — COMPREHENSIVE METABOLIC PANEL
ALT: 26 U/L (ref 0–44)
AST: 20 U/L (ref 15–41)
Albumin: 3.6 g/dL (ref 3.5–5.0)
Alkaline Phosphatase: 72 U/L (ref 38–126)
Anion gap: 10 (ref 5–15)
BUN: 19 mg/dL (ref 6–20)
CO2: 23 mmol/L (ref 22–32)
Calcium: 9.5 mg/dL (ref 8.9–10.3)
Chloride: 102 mmol/L (ref 98–111)
Creatinine, Ser: 1.32 mg/dL — ABNORMAL HIGH (ref 0.61–1.24)
GFR, Estimated: >60 mL/min (ref >60)
Glucose, Bld: 95 mg/dL (ref 70–99)
Potassium: 4.3 mmol/L (ref 3.5–5.1)
Sodium: 135 mmol/L (ref 135–145)
Total Bilirubin: 0.6 mg/dL (ref 0.3–1.2)
Total Protein: 6.8 g/dL (ref 6.5–8.1)

## 2021-04-15 LAB — CBC
HCT: 43 % (ref 39.0–52.0)
Hemoglobin: 13.7 g/dL (ref 13.0–17.0)
MCH: 28.7 pg (ref 26.0–34.0)
MCHC: 31.9 g/dL (ref 30.0–36.0)
MCV: 90.1 fL (ref 80.0–100.0)
Platelets: 352 10*3/uL (ref 150–400)
RBC: 4.77 MIL/uL (ref 4.22–5.81)
RDW: 13.2 % (ref 11.5–15.5)
WBC: 5.7 10*3/uL (ref 4.0–10.5)
nRBC: 0 % (ref 0.0–0.2)

## 2021-04-15 LAB — LIPASE, BLOOD: Lipase: 24 U/L (ref 11–51)

## 2021-04-15 MED ORDER — TAMSULOSIN HCL 0.4 MG PO CAPS: 0.8000 mg | ORAL_CAPSULE | Freq: Every day | ORAL | 3 refills | Status: DC

## 2021-04-15 MED ORDER — OXYCODONE HCL 5 MG PO TABS: 20.0000 mg | ORAL_TABLET | ORAL | Status: DC

## 2021-04-15 MED ORDER — DEXAMETHASONE 4 MG PO TABS: 4.0000 mg | ORAL_TABLET | Freq: Every day | ORAL | 2 refills | Status: DC

## 2021-04-15 MED ORDER — OXYCODONE HCL 20 MG PO TABS: 20.0000 mg | ORAL_TABLET | Freq: Three times a day (TID) | ORAL | 0 refills | Status: DC

## 2021-04-15 NOTE — Progress Notes (Signed)
Patient did not show for appointment.   

## 2021-04-15 NOTE — ED Notes (Signed)
LWBS 

## 2021-04-15 NOTE — Progress Notes (Signed)
Patient was in ED waiting room at [facility] for > 9 hours without being seen. He reported that he could no longer wait and needed to be home to help take care of his family-he has a son with autism. He also stated that he could no longer handle his pain levels and felt like he would be better off at home.   I spoke with him later in the afternoon and we discussed the following plan:  Patient is feeling very frustrated and also scared about his current condition and feels like he is not getting the medical attention or advice that he needs to get better and figure out why he is having such severe rectal pain that started acutely after straining on the toilet. His history is convincing for J-Pouch dysfunction or malfunction. He saw [facility] yesterday but doesn't feel like he got a thorough evaluation and has often felt like he has not been taken seriously or his pain has been dismissed or minimized. He reports things got much worse for him following ileostomy reversal. ?Pouchitis or output dysfunction-he is very clear that he feels a buldge that happened suddenly and that he was told it was just a band of scar tissue-he wants a second opinion on this and imaging to further determine any anatomic changes-cancer progression or problems beyond what was visualized with an anoscope. He WANTS to get systemic treatment but has been too debilitated to even get to his appointments- he tells me he didn't understand how serious things were for him until he really took time to read his medical chart and do his own research. His testicle are severely swollen and he reports he had this following his surgery and was told he had retrograde urination from the surgery-he requested medication that helped him with this in the past-which I think is flomax-so I agreed to call this in. I will also call in oxycodone- this is more helpful than dilaudid. 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 He has asked me to reach out to Dr. Johney Maine and Dr. Benay Spice to better coordinate his care. He has also requested social worker assistance and help planning for how he will provide for his family and handle things in the future.He has been more tearful and sad than usual and is really just very scared and feeling lost feeling in the system and dealing with his current condition.  Lane Hacker, DO Palliative Medicine  Time: 50 minutes.

## 2021-04-15 NOTE — ED Triage Notes (Signed)
The pt is c.I rectal pain he has  recta and lung ??? [place].  He thinks that he has a  blockage pt c/o pain he has been in the bed for 3 months

## 2021-04-15 NOTE — Progress Notes (Addendum)
Palliative Care  Mr. Bradley Colon has metastatic rectal cancer and has had difficult to manage rectal pain and spasm over the past several weeks. He has seen interventional pain management and been managed at [facility] for related post-operative pain.   I had a virtual visit appointment set up for him this AM, but he is presently in the ED at [facility].  I called and spoke with him by phone -he has been in ED waiting for several hours-he does not have an assigned provider or room yet. His pain is very severe -he says that he is also having urinary incontinence and his testicles are very swollen. He has pain radiating down his leg in a sciatic distribution as well.  He does not believe that his condition has been evaluated fully and is concerned that he has a new process going on in his pelvis.  His surgical date was 11/18/2019-LAR with low anterior resection and diverting loop ileostomy and ablation of solitary liver metastasis (2 cm segment 7), moderately differentiated adenocarcinoma invading into soft tissue, LVI present, perineural invasion present. He had had take down of ileostomy on 06/22/20 and J-pouch creation.  Recommend IV hydromorphone for pain and B&O suppository- he has not been able to afford these outpatient but they have been helpful in the past.  Patient may need admission for pain control and further evaluation of pain source. He may have J-Pouch inflammation or output issues based on his description of pain.   He has not been able to tolerate additional systemic treatment for his cancer due to pain and debility. He feels like if this cannot get better then he wants to go back to having a colostomy.  I will see him in the ED if he is still there this afternoon when I am on the [place]. Will request ED place order for palliative care.  Lane Hacker, DO Palliative Medicine

## 2021-04-16 ENCOUNTER — Encounter: Payer: Medicaid Other | Admitting: Family

## 2021-04-19 ENCOUNTER — Inpatient Hospital Stay: Payer: Medicaid Other | Admitting: General Practice

## 2021-04-19 ENCOUNTER — Encounter: Payer: Self-pay | Admitting: General Practice

## 2021-04-19 NOTE — Progress Notes (Signed)
[place] CSW Progress Notes  Reached patient by phone, states that medications prescribed by Dr Hilma Favors have worked well.  States that Flomax has provided relief for his urinary issues.  Also states that pain medication regimen Dr Hilma Favors prescribed has provided significant relief.    Expresses significant distress over "lost time" due to significant pain issues. "I have lost my housing, my time w my kids, my life" over the past months when he was dealing with pain issues.  Frustrated with having visited multiple physicians with no pain relief.  Wants to be referred to a urologist due to urinary leakage.  Wants to be referred to surgeon for second opinion re his pain, concerned that there may be issues w his J pouch, pouchitis and/or scar tissue.    Edwyna Shell, LCSW Clinical Social Worker Phone:  [phone]

## 2021-04-19 NOTE — Progress Notes (Signed)
[place] CSW Progress Notes  Call to patient to check in, no answer.  Left VM w my contact information and encouragement to call back so we can continue to work on stabilizing his issues/concerns.  Edwyna Shell, LCSW Clinical Social Worker Phone:  [phone]

## 2021-04-23 ENCOUNTER — Telehealth: Payer: Self-pay

## 2021-04-23 ENCOUNTER — Inpatient Hospital Stay: Payer: Medicaid Other

## 2021-04-23 ENCOUNTER — Inpatient Hospital Stay: Payer: Medicaid Other | Admitting: Oncology

## 2021-04-23 ENCOUNTER — Telehealth: Payer: Self-pay | Admitting: *Deleted

## 2021-04-23 ENCOUNTER — Encounter: Payer: Self-pay | Admitting: Oncology

## 2021-04-23 NOTE — Telephone Encounter (Signed)
Left VM that he does not feel up to having chemotherapy today and asking when he can get an appointment to discuss all his bowel issues. Says "I think I'm having bag complications and that my bag is impacted". Having rectal oozing, which is the only time he has had success w/BM. Thinks he is impacted despite use of laxatives. 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

## 2021-04-23 NOTE — Telephone Encounter (Signed)
TC to Pt inquiring about missed appointment. Informed Pt that Dr Benay Spice is concerned with the progression of his cancer and wants him to come in to be seen. Pt states he has not been able to go to the bathroom and did an enema and has had some relief after the enema. Pt is currently under Dr Delanna Ahmadi care for pain. Pt states he is waiting for referral to a surgeon. Talking to Pt about his missed appointments. Pt became irate and hung up the phone.

## 2021-04-24 ENCOUNTER — Encounter: Payer: Self-pay | Admitting: Oncology

## 2021-04-24 ENCOUNTER — Encounter: Payer: Self-pay | Admitting: *Deleted

## 2021-04-25 ENCOUNTER — Inpatient Hospital Stay: Payer: Medicaid Other

## 2021-05-03 ENCOUNTER — Encounter: Payer: Self-pay | Admitting: Oncology

## 2021-05-07 ENCOUNTER — Other Ambulatory Visit: Payer: Self-pay

## 2021-05-07 ENCOUNTER — Other Ambulatory Visit (HOSPITAL_COMMUNITY): Payer: Self-pay

## 2021-05-07 MED ORDER — PEG-3350/ELECTROLYTES 236 G PO SOLR
4000.0000 mL | Freq: Once | ORAL | 0 refills | Status: AC
  Filled 2021-05-07: qty 4000, 1d supply, fill #0

## 2021-05-08 ENCOUNTER — Other Ambulatory Visit: Payer: Self-pay

## 2021-05-08 ENCOUNTER — Other Ambulatory Visit: Payer: Self-pay | Admitting: Internal Medicine

## 2021-05-08 ENCOUNTER — Other Ambulatory Visit (HOSPITAL_COMMUNITY): Payer: Self-pay

## 2021-05-08 NOTE — Telephone Encounter (Signed)
Prescriber not at this practice.

## 2021-05-09 ENCOUNTER — Other Ambulatory Visit (HOSPITAL_COMMUNITY): Payer: Self-pay

## 2021-05-13 ENCOUNTER — Other Ambulatory Visit (HOSPITAL_COMMUNITY): Payer: Self-pay

## 2021-05-14 ENCOUNTER — Other Ambulatory Visit (HOSPITAL_COMMUNITY): Payer: Self-pay

## 2021-05-14 ENCOUNTER — Other Ambulatory Visit: Payer: Self-pay | Admitting: Internal Medicine

## 2021-05-14 MED ORDER — OXYCODONE HCL 20 MG PO TABS: 20.0000 mg | ORAL_TABLET | Freq: Three times a day (TID) | ORAL | 0 refills | Status: DC

## 2021-05-14 MED ORDER — BACLOFEN 10 MG PO TABS: 10.0000 mg | ORAL_TABLET | Freq: Three times a day (TID) | ORAL | 3 refills | Status: DC | PRN

## 2021-05-14 NOTE — Progress Notes (Signed)
Refilled oxycodone and bacofen. Will schedule follow-up appointment for next week.  Lane Hacker, DO Palliative Medicine

## 2021-05-15 ENCOUNTER — Other Ambulatory Visit (HOSPITAL_COMMUNITY): Payer: Self-pay

## 2021-05-15 NOTE — Progress Notes (Signed)
Palliative Medicine RN Note: Rec'd a request from Dr Lane Hacker to start Prior Auth for pt's oxycodone. I called [facility] at [phone] and started process, reference # [id number]. Faxed clinical information to [phone]. They state turnaround is 23h61min, and they will contact pharmacy when it is approved.  Marjie Skiff Fransico Sciandra, RN, BSN, [facility] Palliative Medicine Team 05/15/2021 1:21 PM Office [phone]

## 2021-05-16 NOTE — Progress Notes (Addendum)
Palliative Medicine RN Note: Rec'd fax at our office that Oxycodone rx Prior Auth was approved until 11/11/21. PA# [id number]  Marjie Skiff. Cherylann Ratel, RN, BSN, [facility] Palliative Medicine Team 05/16/2021 7:54 AM Office [phone]      Rec'd a message from [facility]. Pt called them asking about his meds. I called Mr Yoho to let him know they should be available. He was having significant distress related to what treatment he should pursue and his probable need for another colostomy. Dr Hilma Favors has a 1000 meeting, but she will reach out to him afterwards.  Marjie Skiff Quincie Haroon, RN, BSN, [facility] Palliative Medicine Team 05/16/2021 10:07 AM Office [phone]

## 2021-05-17 ENCOUNTER — Telehealth: Payer: Self-pay | Admitting: *Deleted

## 2021-05-17 ENCOUNTER — Other Ambulatory Visit: Payer: Self-pay | Admitting: Internal Medicine

## 2021-05-17 MED ORDER — NALOXEGOL OXALATE 25 MG PO TABS: 25.0000 mg | ORAL_TABLET | Freq: Every day | ORAL | 1 refills | Status: DC

## 2021-05-17 MED ORDER — GABAPENTIN 600 MG PO TABS: 600.0000 mg | ORAL_TABLET | Freq: Three times a day (TID) | ORAL | 3 refills | Status: DC

## 2021-05-17 MED ORDER — DIAZEPAM 10 MG PO TABS: 10.0000 mg | ORAL_TABLET | Freq: Two times a day (BID) | ORAL | 1 refills | Status: DC | PRN

## 2021-05-17 NOTE — Telephone Encounter (Signed)
Dr. Benay Spice informed by Dr. Hilma Favors that patient is willing to come in to talk about chemo again. Called patient and offered lab/flush/OV with BS on 7/19 at 1:30 pm. Patient began to review his current medical issues that he wants to get resolved before starting chemotherapy: constant need to have BM and/or just having that sensation; urinary frequency w/small amounts of urine (7/night); wants to see Dr. Johney Maine before resumption of chemotherapy. He has agreed to appointment on 05/21/21. Reminded him to put on EMLA cream for his port access.

## 2021-05-17 NOTE — Progress Notes (Signed)
Palliative Care Telephone Note  Returned call to follow up with Bradley Colon after he spoke with several staff members regarding his care and pain management. He was delayed in getting his oxycodone due to insurance prior authorization.  Bradley Colon remains extremely frustrated with his situation, he is very anxious today. He continues to struggle to communicate with providers and staff-especially over the phone. He wants to communicate in writing when possible because he feels he is treated poorly by phone and that he has been misunderstood and labeled as a drug addict and non-compliant. His pain is under good control most of the time-but he continues to have severe burning and pressure in his rectum and he wants this addressed as soon as possible.  I will continue to prescribe for him and discussed getting on a schedule for refills etc.. He remains extremely worried about receiving systemic treatment- he knows he needs this and that his cancer is serious but feels that until he gets his issues with his rectum and j-pouch figured out its too much for him to deal with.  He also is having home and financial stressors including having to move with almost no notice and caring for his special needs child.  I discussed how I could best help him and how we could keep focused on next steps- He tells me he cant wait until august to have colonoscopy to eval his bowel-he thinks he needs J-pouch revision and if that doesn't work he reports he would go back to his colostomy bag which would be better than the suffering he is currently going through- unfortunately he is hearing from surgery that they will not even consider taking him back to surgery-and is taking that at face value.He wants a second opinion from Dr. Alwyn Pea at [facility].  I will contact Dr. Johney Maine to discuss his case. I will also contact [facility] to see what the options are for him and try to be clear with him on a plan.   Lane Hacker, DO Palliative  Medicine

## 2021-05-21 ENCOUNTER — Inpatient Hospital Stay: Payer: Medicaid Other

## 2021-05-21 ENCOUNTER — Inpatient Hospital Stay: Payer: Medicaid Other | Attending: Oncology | Admitting: Oncology

## 2021-05-21 ENCOUNTER — Other Ambulatory Visit: Payer: Self-pay

## 2021-05-21 VITALS — BP 146/109 | HR 73 | Temp 97.8°F | Resp 18 | Ht 67.0 in | Wt 205.8 lb

## 2021-05-21 DIAGNOSIS — C20 Malignant neoplasm of rectum: Secondary | ICD-10-CM

## 2021-05-21 DIAGNOSIS — Z95828 Presence of other vascular implants and grafts: Secondary | ICD-10-CM

## 2021-05-21 DIAGNOSIS — Z5189 Encounter for other specified aftercare: Secondary | ICD-10-CM | POA: Diagnosis present

## 2021-05-21 LAB — CMP (CANCER CENTER ONLY)
ALT: 20 U/L (ref 0–44)
AST: 16 U/L (ref 15–41)
Albumin: 4.2 g/dL (ref 3.5–5.0)
Alkaline Phosphatase: 72 U/L (ref 38–126)
Anion gap: 9 (ref 5–15)
BUN: 22 mg/dL — ABNORMAL HIGH (ref 6–20)
CO2: 26 mmol/L (ref 22–32)
Calcium: 9.1 mg/dL (ref 8.9–10.3)
Chloride: 102 mmol/L (ref 98–111)
Creatinine: 1.05 mg/dL (ref 0.61–1.24)
GFR, Estimated: >60 mL/min (ref >60)
Glucose, Bld: 99 mg/dL (ref 70–99)
Potassium: 4 mmol/L (ref 3.5–5.1)
Sodium: 137 mmol/L (ref 135–145)
Total Bilirubin: 1 mg/dL (ref 0.3–1.2)
Total Protein: 7 g/dL (ref 6.5–8.1)

## 2021-05-21 LAB — CBC WITH DIFFERENTIAL (CANCER CENTER ONLY)
Abs Immature Granulocytes: 0.07 10*3/uL (ref 0.00–0.07)
Basophils Absolute: 0 10*3/uL (ref 0.0–0.1)
Basophils Relative: 0 %
Eosinophils Absolute: 0.1 10*3/uL (ref 0.0–0.5)
Eosinophils Relative: 1 %
HCT: 40.7 % (ref 39.0–52.0)
Hemoglobin: 13.2 g/dL (ref 13.0–17.0)
Immature Granulocytes: 1 %
Lymphocytes Relative: 29 %
Lymphs Abs: 2.6 10*3/uL (ref 0.7–4.0)
MCH: 28.9 pg (ref 26.0–34.0)
MCHC: 32.4 g/dL (ref 30.0–36.0)
MCV: 89.1 fL (ref 80.0–100.0)
Monocytes Absolute: 1.1 10*3/uL — ABNORMAL HIGH (ref 0.1–1.0)
Monocytes Relative: 12 %
Neutro Abs: 5.3 10*3/uL (ref 1.7–7.7)
Neutrophils Relative %: 57 %
Platelet Count: 362 10*3/uL (ref 150–400)
RBC: 4.57 MIL/uL (ref 4.22–5.81)
RDW: 13.7 % (ref 11.5–15.5)
WBC Count: 9.1 10*3/uL (ref 4.0–10.5)
nRBC: 0 % (ref 0.0–0.2)

## 2021-05-21 LAB — CEA (ACCESS): CEA (CHCC): 28.2 ng/mL — ABNORMAL HIGH (ref 0.00–5.00)

## 2021-05-21 MED ORDER — ALTEPLASE 2 MG IJ SOLR
2.0000 mg | Freq: Once | INTRAMUSCULAR | Status: AC
  Administered 2021-05-21: 2 mg
  Filled 2021-05-21: qty 2

## 2021-05-21 MED ORDER — HEPARIN SOD (PORK) LOCK FLUSH 100 UNIT/ML IV SOLN
500.0000 [IU] | Freq: Once | INTRAVENOUS | Status: AC
  Administered 2021-05-21: 500 [IU]
  Filled 2021-05-21: qty 5

## 2021-05-21 MED ORDER — SODIUM CHLORIDE 0.9% FLUSH
10.0000 mL | Freq: Once | INTRAVENOUS | Status: AC
Start: 2021-05-21 — End: 2021-05-21
  Administered 2021-05-21: 10 mL
  Filled 2021-05-21: qty 10

## 2021-05-21 NOTE — Progress Notes (Signed)
[facility] OFFICE PROGRESS NOTE   Diagnosis: Rectal cancer  INTERVAL HISTORY:   Bradley Colon was last seen at the cancer center in May.  He continues to have severe pain at the rectum.  He is now followed by Dr. Hilma Favors.  He reports partial improvement in pain with the current medical regimen.  He is taking oxycodone, but has not started gabapentin.  He continues to have frequent bowel movements, burning pain at the perineum and buttock, and erectile dysfunction.  Objective:  Vital signs in last 24 hours:  Blood pressure (!) 146/109, pulse 73, temperature 97.8 F (36.6 C), temperature source Oral, resp. rate 18, height _0  (1.702 m), weight 205 lb 12.8 oz (93.4 kg), SpO2 100 %.   Lymphatics: No cervical, supraclavicular, axillary, or inguinal nodes Resp: Inspiratory bronchial sounds bilaterally, no respiratory distress Cardio: Regular rate and rhythm GI: No hepatosplenomegaly, nontender, no mass Vascular: No leg edema    Portacath/PICC-without erythema  Lab Results:  Lab Results  Component Value Date   WBC 9.1 05/21/2021   HGB 13.2 05/21/2021   HCT 40.7 05/21/2021   MCV 89.1 05/21/2021   PLT 362 05/21/2021   NEUTROABS 5.3 05/21/2021    CMP  Lab Results  Component Value Date   NA 135 04/15/2021   K 4.3 04/15/2021   CL 102 04/15/2021   CO2 23 04/15/2021   GLUCOSE 95 04/15/2021   BUN 19 04/15/2021   CREATININE 1.32 (H) 04/15/2021   CALCIUM 9.5 04/15/2021   PROT 6.8 04/15/2021   ALBUMIN 3.6 04/15/2021   AST 20 04/15/2021   ALT 26 04/15/2021   ALKPHOS 72 04/15/2021   BILITOT 0.6 04/15/2021   GFRNONAA >60 04/15/2021   GFRAA >60 01/02/2020    Lab Results  Component Value Date   CEA1 12.74 (H) 01/21/2021     Medications: I have reviewed the patient's current medications.   Assessment/Plan: Rectal cancer, clinical stage III (T3cN2), Foundation 1 on lymph node 11/18/2019-tumor mutation burden 1, FG R1 amplification, K-ras wild-type,  microsatellite status could not be determined Rectal mass extending from 4-9 cm on colonoscopy 02/15/2019 with a biopsy confirming invasive adenocarcinoma CTs 02/23/2019- thickening of the low rectum without a discrete mass, small bilateral pulmonary nodules, some present on imaging from 2014 Pelvic MRI 02/25/2019- T3cN2 tumor beginning at 4.9 cm from the anal verge and 0 cm from the internal anal sphincter, tumor extends through the muscularis propria, 2.  Rectal lymph nodes and 2 enlarged right internal iliac nodes CTs at [facility] 04/19/2019- new subcentimeter right hepatic lesion concerning for metastasis, stable low rectal mass with prominent mesorectal lymph nodes, stable pathologic enlarged right internal iliac nodes, stable pulmonary nodules Xeloda/radiation beginning 04/26/2019, completed 07/05/2019 CT 07/26/2019-growth of previously described right hepatic lesion, slight decrease in pelvic lymphadenopathy and rectal thickening 08/05/2019-week 1 bolus 5-FU/leucovorin (200 mg/m) 08/12/2019-week to bolus 5-FU/leucovorin (400 mg/m) 10/16/ 2020-week 3 bolus 5-FU/leucovorin (400 mg/m) and oxaliplatin (42.5 mg/m) CT chest 10/04/2019-slight increase in bilateral pulmonary nodules concerning for metastases MRI abdomen 10/06/2019-right hepatic lesion consistent with metastasis, wall thickening of the low anterior rectum, no new or enlarging adenopathy, stable mesorectal and right internal iliac nodes PET scan 10/14/2019-pulmonary nodules below PET resolution, hypermetabolic right posterior hepatic lesion, mildly hypermetabolic partially calcified right internal iliac chain lymphadenopathy, mild gastric hypermetabolism and thickening-likely gastritis 11/18/2019-LAR with diverting loop ileostomy and ablation of solitary liver metastasis (2 cm segment 7), moderately differentiated adenocarcinoma invading into soft tissue, LVI present, perineural invasion present, 5/15 lymph nodes  positive, 4 tumor deposits, absent  treatment effect-score 3,ypT3 ypN2a, mismatch repair protein expression intact, MSS CT chest 01/16/2020-increase in the size of a dominant right lower lobe nodule, unchanged left upper lobe and left lower lobe nodules CT chest 04/11/2020-enlargement of bilateral lung nodules, and new nodules CT chest 09/11/2020-enlargement of bilateral lung nodules, aggressive medial right liver lesion Cycle 1 FOLFOX 10/31/2020 Cycle 2 FOLFOX 11/14/2020 Cycle 3 FOLFOX 11/29/2020 CT chest 01/03/2021-multiple bilateral lung nodules, stable to slightly decreased in size, no new lesions, hepatic dome lesion is smaller (ablated lesion?) Cycle 4 FOLFOX 01/21/2021 Pain secondary to #1, improved Reactive airways disease/asthma Report of multiple allergens Port-A-Cath related DVT, right IJ, 09/30/2019-apixaban, converted to Lovenox surrounding surgery January 2021, changed back to apixaban 12/30/2019 Erectile dysfunction following treatment for rectal cancer-followed by urology Rectal/perineal pain following the low anterior resection-persistent     Disposition: Bradley. Mastel has metastatic rectal cancer.  He has multiple complaints today including persistent rectal/perineal pain, erectile dysfunction, and a difficult home social/financial situation.  I saw him today with the Production designer, theatre/television/film.  We explained our concern for his wellbeing.  We expressed understanding of the difficult situation he is facing.  I explained the metastatic rectal cancer is his life limiting diagnosis.  He does not wish to consider restaging imaging or treatment for the rectal cancer at present.  He plans to continue follow-up with Dr. Hilma Favors for pain management.  He will see Dr. Johney Maine next week to evaluate the etiology of the pain and discuss any available treatment.  He agrees to a follow-up appointment in approximately 2 weeks.  Betsy Coder, MD  05/21/2021  2:57 PM

## 2021-05-22 LAB — CEA (IN HOUSE-CHCC): CEA (CHCC-In House): 41.21 ng/mL — ABNORMAL HIGH (ref 0.00–5.00)

## 2021-05-23 ENCOUNTER — Inpatient Hospital Stay: Payer: Medicaid Other | Admitting: General Practice

## 2021-05-23 ENCOUNTER — Encounter: Payer: Self-pay | Admitting: General Practice

## 2021-05-23 DIAGNOSIS — C20 Malignant neoplasm of rectum: Secondary | ICD-10-CM

## 2021-05-23 NOTE — Progress Notes (Signed)
[place] CSW Progress Notes  Patient returned my call.  States that pain is better managed with addition of gabapentin.  Encouraged him to clarify social work goals.  "I am under shock that I am not dealing with cancer pain, but surgery pain."  He is dealing with stress of needing to find another home by August 5th - plans to relocate with his partner and children.  He has looked via Personal assistant, rental agencies and word of mouth - has not located anywhere he can afford to date.  He will continue to look.  He will also work on [place] alterative places where he could stay temporarily if he is unable to find permanent housing.  Advised him that his [facility] has a Theatre stage manager for moving expenses, encouraged him to contact them directly and request their help. Encouraged him to keep his appointment w Dr Johney Maine for surgical consult - also encouraged him to consider scans as proposed by oncologist and palliative care.  His major focus at this time is securing housing and stability for himself and his family - he hopes that when he is stable he will be able to continue his cancer treatments.  Edwyna Shell, LCSW Clinical Social Worker Phone:  [phone]

## 2021-05-23 NOTE — Progress Notes (Signed)
[place] CSW Progress Notes  Called patient for support/resources, following up from meeting w oncologist on 7/19.  No answer, left VM w my contact information and encouragement to return my call.  Edwyna Shell, LCSW Clinical Social Worker Phone:  [phone]

## 2021-05-29 ENCOUNTER — Telehealth: Payer: Self-pay | Admitting: General Practice

## 2021-05-29 NOTE — Telephone Encounter (Signed)
[place] CSW Progress Notes  VM from  patient on 7/28, 1 PM.  Long and difficult to understand message re persistent pain and rectal spasms.  Wants to speak w Dr Hilma Favors.  SM sent to Isidoro Donning RN and Douglass Rivers MD.  Tried to call patient to direct him to contact Dr Hilma Favors through oncologist, as has been requested, but his VM is full.  Edwyna Shell, LCSW Clinical Social Worker Phone:  [phone]

## 2021-05-30 ENCOUNTER — Telehealth: Payer: Self-pay | Admitting: Internal Medicine

## 2021-06-03 ENCOUNTER — Other Ambulatory Visit: Payer: Self-pay | Admitting: Internal Medicine

## 2021-06-03 DIAGNOSIS — C20 Malignant neoplasm of rectum: Secondary | ICD-10-CM

## 2021-06-03 MED ORDER — PHENAZOPYRIDINE HCL 100 MG PO TABS: 100.0000 mg | ORAL_TABLET | Freq: Three times a day (TID) | ORAL | 3 refills | Status: DC | PRN

## 2021-06-03 MED ORDER — OXYCODONE HCL 20 MG PO TABS: 20.0000 mg | ORAL_TABLET | Freq: Four times a day (QID) | ORAL | 0 refills | Status: AC | PRN

## 2021-06-03 MED ORDER — XTAMPZA ER 18 MG PO C12A: 18.0000 mg | EXTENDED_RELEASE_CAPSULE | Freq: Two times a day (BID) | ORAL | 0 refills | Status: DC

## 2021-06-03 MED ORDER — GABAPENTIN 600 MG PO TABS: 600.0000 mg | ORAL_TABLET | Freq: Three times a day (TID) | ORAL | 3 refills | Status: DC

## 2021-06-03 MED ORDER — CIPROFLOXACIN HCL 250 MG PO TABS: 250.0000 mg | ORAL_TABLET | Freq: Two times a day (BID) | ORAL | 0 refills | Status: AC

## 2021-06-03 NOTE — Progress Notes (Signed)
CT abdomen pelvis ordered for cancer interval and for treatment response.  Long acting oxycodone started with q6 breakthrough sent to Pharmacy on record=- [facility]/  Trial of Pyridium for urethral pain.  See documentation under patient communication.

## 2021-06-04 ENCOUNTER — Encounter: Payer: Self-pay | Admitting: Oncology

## 2021-06-10 ENCOUNTER — Other Ambulatory Visit: Payer: Self-pay | Admitting: Internal Medicine

## 2021-06-10 ENCOUNTER — Encounter: Payer: Self-pay | Admitting: *Deleted

## 2021-06-10 NOTE — Progress Notes (Signed)
PA Completed for Xtampza 18mg  BID for long acting pain control.

## 2021-06-11 ENCOUNTER — Other Ambulatory Visit: Payer: Self-pay | Admitting: *Deleted

## 2021-06-11 DIAGNOSIS — C20 Malignant neoplasm of rectum: Secondary | ICD-10-CM

## 2021-06-11 NOTE — Progress Notes (Signed)
Per Dr. Benay Spice: No contrast (IV or oral) is not optimal since patient declines even oral contrast. Will order restaging PET. Patient notified it will be scheduled when PA is completed. Managed care notified.

## 2021-06-12 ENCOUNTER — Other Ambulatory Visit: Payer: Self-pay | Admitting: Internal Medicine

## 2021-06-12 ENCOUNTER — Inpatient Hospital Stay: Payer: Medicaid Other | Attending: Oncology | Admitting: Oncology

## 2021-06-12 MED ORDER — MORPHINE SULFATE ER 60 MG PO TBCR: 60.0000 mg | EXTENDED_RELEASE_TABLET | Freq: Two times a day (BID) | ORAL | 0 refills | Status: DC

## 2021-06-12 NOTE — Progress Notes (Unsigned)
Unable to get Xtampza approved-ill send in script for MSContin XR.

## 2021-06-12 NOTE — Progress Notes (Signed)
Palliative Medicine RN Note:   Prior Auth sent to insurance company for MS ER: [id number] - PA Case ID: [id number] - Rx #: [id number].  Marjie Skiff Shahd Occhipinti, RN, BSN, [facility] Palliative Medicine Team 06/12/2021 11:39 AM Office [phone]    Rec'd immediate response that medication was Approved:   PA Case: [id number], Status: Approved, Coverage Starts on: 06/12/2021 12:00:00 AM, Coverage Ends on: 09/10/2021 12:00:00 AM.  Marjie Skiff. Messiah Ahr, RN, BSN, [facility] Palliative Medicine Team 06/12/2021 11:40 AM Office [phone]

## 2021-06-18 ENCOUNTER — Other Ambulatory Visit: Payer: Self-pay | Admitting: Internal Medicine

## 2021-06-18 ENCOUNTER — Encounter: Payer: Self-pay | Admitting: *Deleted

## 2021-06-18 MED ORDER — DEXAMETHASONE 4 MG PO TABS: 4.0000 mg | ORAL_TABLET | Freq: Every day | ORAL | 2 refills | Status: DC

## 2021-06-18 MED ORDER — HYDROXYZINE PAMOATE 50 MG PO CAPS
50.0000 mg | ORAL_CAPSULE | Freq: Three times a day (TID) | ORAL | 2 refills | Status: DC | PRN
Start: 2021-06-18 — End: 2021-12-04

## 2021-06-18 MED ORDER — CLOBETASOL PROP EMOLLIENT BASE 0.05 % EX CREA: 1.0000 "application " | TOPICAL_CREAM | Freq: Two times a day (BID) | CUTANEOUS | 0 refills | Status: DC

## 2021-06-24 ENCOUNTER — Other Ambulatory Visit: Payer: Self-pay | Admitting: *Deleted

## 2021-06-24 ENCOUNTER — Encounter: Payer: Self-pay | Admitting: *Deleted

## 2021-06-24 NOTE — Progress Notes (Signed)
Per Dr. Benay Spice: No indication that a PET scan is required for Bradley Colon. Will order a non-contrast CT C/A/P and strongly encourage him to consume the oral contrast, which is barium and not iodinated. Notified patient via MyChart with current plan.

## 2021-06-25 ENCOUNTER — Encounter (HOSPITAL_COMMUNITY): Payer: Medicaid Other

## 2021-06-25 ENCOUNTER — Other Ambulatory Visit: Payer: Self-pay | Admitting: Oncology

## 2021-06-25 DIAGNOSIS — C20 Malignant neoplasm of rectum: Secondary | ICD-10-CM

## 2021-06-28 ENCOUNTER — Encounter: Payer: Self-pay | Admitting: Oncology

## 2021-07-01 ENCOUNTER — Encounter: Payer: Self-pay | Admitting: *Deleted

## 2021-07-02 ENCOUNTER — Other Ambulatory Visit: Payer: Self-pay

## 2021-07-02 ENCOUNTER — Ambulatory Visit (HOSPITAL_BASED_OUTPATIENT_CLINIC_OR_DEPARTMENT_OTHER)
Admission: RE | Admit: 2021-07-02 | Discharge: 2021-07-02 | Disposition: A | Discharge status: Home / Self Care | Payer: Medicaid Other | Source: Ambulatory Visit | Attending: Oncology | Admitting: Oncology

## 2021-07-02 ENCOUNTER — Other Ambulatory Visit (HOSPITAL_COMMUNITY): Payer: Self-pay

## 2021-07-02 DIAGNOSIS — C20 Malignant neoplasm of rectum: Secondary | ICD-10-CM | POA: Insufficient documentation

## 2021-07-03 ENCOUNTER — Other Ambulatory Visit (HOSPITAL_COMMUNITY): Payer: Self-pay

## 2021-07-04 ENCOUNTER — Telehealth: Payer: Self-pay | Admitting: *Deleted

## 2021-07-04 NOTE — Telephone Encounter (Signed)
Called patient to offer appointment to see Dr. Benay Spice to review his CT results and plan next steps. He agrees to tomorrow at 1140.

## 2021-07-05 ENCOUNTER — Other Ambulatory Visit: Payer: Self-pay

## 2021-07-05 ENCOUNTER — Inpatient Hospital Stay: Payer: Medicaid Other | Attending: Oncology | Admitting: Oncology

## 2021-07-05 VITALS — BP 150/84 | HR 63 | Temp 98.1°F | Resp 98 | Ht 67.0 in | Wt 210.4 lb

## 2021-07-05 DIAGNOSIS — R159 Full incontinence of feces: Secondary | ICD-10-CM | POA: Insufficient documentation

## 2021-07-05 DIAGNOSIS — Z923 Personal history of irradiation: Secondary | ICD-10-CM | POA: Insufficient documentation

## 2021-07-05 DIAGNOSIS — C787 Secondary malignant neoplasm of liver and intrahepatic bile duct: Secondary | ICD-10-CM | POA: Insufficient documentation

## 2021-07-05 DIAGNOSIS — Z9221 Personal history of antineoplastic chemotherapy: Secondary | ICD-10-CM | POA: Insufficient documentation

## 2021-07-05 DIAGNOSIS — C7800 Secondary malignant neoplasm of unspecified lung: Secondary | ICD-10-CM | POA: Insufficient documentation

## 2021-07-05 DIAGNOSIS — K5900 Constipation, unspecified: Secondary | ICD-10-CM | POA: Insufficient documentation

## 2021-07-05 DIAGNOSIS — C20 Malignant neoplasm of rectum: Secondary | ICD-10-CM | POA: Insufficient documentation

## 2021-07-05 DIAGNOSIS — G893 Neoplasm related pain (acute) (chronic): Secondary | ICD-10-CM | POA: Diagnosis not present

## 2021-07-05 NOTE — Progress Notes (Signed)
Bradley Colon OFFICE PROGRESS NOTE   Diagnosis: Rectal cancer  INTERVAL HISTORY:   Bradley Colon returns as scheduled.  He is here today with his mother.  His daughter is present by telephone.  He continues to have pain at the perineum.  He has intermittent fecal incontinence.  He is followed to Dr. Hilma Colon for pain management.  No dyspnea or cough.  Objective:  Vital signs in last 24 hours:  Blood pressure (!) 150/84, pulse 63, temperature 98.1 F (36.7 C), temperature source Oral, resp. rate (!) 98, height '5\' 7"'  (1.702 m), weight 210 lb 6.4 oz (95.4 kg), SpO2 98 %.    Resp: Lungs clear bilaterally Cardio: Regular rate and rhythm GI: No hepatosplenomegaly, nontender Vascular: No leg edema   Portacath/PICC-without erythema  Lab Results:  Lab Results  Component Value Date   WBC 9.1 05/21/2021   HGB 13.2 05/21/2021   HCT 40.7 05/21/2021   MCV 89.1 05/21/2021   PLT 362 05/21/2021   NEUTROABS 5.3 05/21/2021    CMP  Lab Results  Component Value Date   NA 137 05/21/2021   K 4.0 05/21/2021   CL 102 05/21/2021   CO2 26 05/21/2021   GLUCOSE 99 05/21/2021   BUN 22 (H) 05/21/2021   CREATININE 1.05 05/21/2021   CALCIUM 9.1 05/21/2021   PROT 7.0 05/21/2021   ALBUMIN 4.2 05/21/2021   AST 16 05/21/2021   ALT 20 05/21/2021   ALKPHOS 72 05/21/2021   BILITOT 1.0 05/21/2021   GFRNONAA >60 05/21/2021   GFRAA >60 01/02/2020    Lab Results  Component Value Date   CEA1 41.21 (H) 05/21/2021   CEA 28.20 (H) 05/21/2021    Imaging:  CT CHEST ABDOMEN PELVIS WO CONTRAST  Result Date: 07/03/2021 CLINICAL DATA:  Follow-up metastatic rectal carcinoma. EXAM: CT CHEST, ABDOMEN AND PELVIS WITHOUT CONTRAST TECHNIQUE: Multidetector CT imaging of the chest, abdomen and pelvis was performed following the standard protocol without IV contrast. COMPARISON:  Chest CT on 01/03/2021, and AP CT from [facility] on 04/19/2019 FINDINGS: CT CHEST FINDINGS  Cardiovascular: No acute findings. Mediastinum/Lymph Nodes: New 1.2 cm mediastinal lymph node in the azygoesophageal recess. No other pathologically enlarged lymph nodes identified. Lungs/Pleura: Increase in size and number of numerous small bilateral pulmonary nodules, consistent with pulmonary metastases. Index nodule in the right lower lobe measures 2.3 x 1.8 cm on image 95/4, compared to 1.2 x 1.1 cm previously. Index nodule in the left lower lobe measures 1.6 x 1.4 cm on image 78/4, compared to 0.8 x 0.6 cm previously. No evidence of pleural effusion. Musculoskeletal:  No suspicious bone lesions identified. CT ABDOMEN AND PELVIS FINDINGS Hepatobiliary: Low-attenuation mass in the posterosuperior right hepatic lobe currently measures 9.0 x 5.5 cm on image 49/3, compared to 4.0 x 3.7 cm on most recent noncontrast chest CT of 01/03/2021. No other hepatic masses are visualized on this unenhanced exam. Gallbladder is unremarkable. No evidence of biliary ductal dilatation. Pancreas: No mass or inflammatory changes identified on this unenhanced exam. Spleen:  Within normal limits in size. Adrenals/Urinary Tract: No evidence of urolithiasis or hydronephrosis. Unremarkable appearance of bladder. Stomach/Bowel: Post treatment changes are seen in the perirectal and presacral regions. No masses identified. No evidence of obstruction, inflammatory process, or abnormal fluid collections. Vascular/Lymphatic: No pathologically enlarged lymph nodes identified. No abdominal aortic aneurysm. Aortic atherosclerotic calcification noted. Reproductive:  No masses or other significant abnormality. Other: A small paraumbilical ventral hernia is seen containing a single small bowel loop,  with mild dilatation of several small bowel loops proximal to this site. Musculoskeletal:  No suspicious bone lesions identified. IMPRESSION: Marked increased size and number of diffuse bilateral pulmonary metastases. New 1.2 cm mediastinal lymph node  in azygoesophageal recess, consistent with metastatic disease. Increased size of large right hepatic lobe mass, consistent with hepatic metastasis. Small paraumbilical ventral hernia containing a single small bowel loop, causing partial small bowel obstruction. Electronically Signed   By: Marlaine Hind M.D.   On: 07/03/2021 13:56    Medications: I have reviewed the patient's current medications.   Assessment/Plan: Rectal cancer, clinical stage III (T3cN2), Foundation 1 on lymph node 11/18/2019-tumor mutation burden 1, FG R1 amplification, K-ras wild-type, microsatellite status could not be determined Rectal mass extending from 4-9 cm on colonoscopy 02/15/2019 with a biopsy confirming invasive adenocarcinoma CTs 02/23/2019- thickening of the low rectum without a discrete mass, small bilateral pulmonary nodules, some present on imaging from 2014 Pelvic MRI 02/25/2019- T3cN2 tumor beginning at 4.9 cm from the anal verge and 0 cm from the internal anal sphincter, tumor extends through the muscularis propria, 2.  Rectal lymph nodes and 2 enlarged right internal iliac nodes CTs at [facility] 04/19/2019- new subcentimeter right hepatic lesion concerning for metastasis, stable low rectal mass with prominent mesorectal lymph nodes, stable pathologic enlarged right internal iliac nodes, stable pulmonary nodules Xeloda/radiation beginning 04/26/2019, completed 07/05/2019 CT 07/26/2019-growth of previously described right hepatic lesion, slight decrease in pelvic lymphadenopathy and rectal thickening 08/05/2019-week 1 bolus 5-FU/leucovorin (200 mg/m) 08/12/2019-week to bolus 5-FU/leucovorin (400 mg/m) 10/16/ 2020-week 3 bolus 5-FU/leucovorin (400 mg/m) and oxaliplatin (42.5 mg/m) CT chest 10/04/2019-slight increase in bilateral pulmonary nodules concerning for metastases MRI abdomen 10/06/2019-right hepatic lesion consistent with metastasis, wall thickening of the low anterior rectum, no new or enlarging adenopathy, stable  mesorectal and right internal iliac nodes PET scan 10/14/2019-pulmonary nodules below PET resolution, hypermetabolic right posterior hepatic lesion, mildly hypermetabolic partially calcified right internal iliac chain lymphadenopathy, mild gastric hypermetabolism and thickening-likely gastritis 11/18/2019-LAR with diverting loop ileostomy and ablation of solitary liver metastasis (2 cm segment 7), moderately differentiated adenocarcinoma invading into soft tissue, LVI present, perineural invasion present, 5/15 lymph nodes positive, 4 tumor deposits, absent treatment effect-score 3,ypT3 ypN2a, mismatch repair protein expression intact, MSS CT chest 01/16/2020-increase in the size of a dominant right lower lobe nodule, unchanged left upper lobe and left lower lobe nodules CT chest 04/11/2020-enlargement of bilateral lung nodules, and new nodules CT chest 09/11/2020-enlargement of bilateral lung nodules, aggressive medial right liver lesion Cycle 1 FOLFOX 10/31/2020 Cycle 2 FOLFOX 11/14/2020 Cycle 3 FOLFOX 11/29/2020 CT chest 01/03/2021-multiple bilateral lung nodules, stable to slightly decreased in size, no new lesions, hepatic dome lesion is smaller (ablated lesion?) Cycle 4 FOLFOX 01/21/2021 CTs 07/02/2021-increase in size and number of pulmonary metastases, new mediastinal lymph node, increased right hepatic lobe metastasis Pain secondary to #1, improved Reactive airways disease/asthma Report of multiple allergens Port-A-Cath related DVT, right IJ, 09/30/2019-apixaban, converted to Lovenox surrounding surgery January 2021, changed back to apixaban 12/30/2019 Erectile dysfunction following treatment for rectal cancer-followed by urology Rectal/perineal pain following the low anterior resection-persistent    Disposition: Mr Rosko has metastatic rectal cancer.  He has been maintained off of systemic therapy since March of this year.  The restaging CTs confirm disease progression in the lungs and liver.  I  discussed the CT findings and reviewed the images with BradleyAcord and his family.  We discussed the prognosis and treatment options.  He understands no therapy will be curative.  I am concerned he will become symptomatic within the next few months given the significant tumor burden in the lungs.  I recommend resuming systemic therapy.  We discussed FOLFOX, FOLFIRI, and panitumumab.  The tumor is K-ras wild-type.  I recommend treatment with FOLFIRI/panitumumab.  This regimen has the greatest chance of achieving a clinical response.  He would like to consider repeat treatment with FOLFOX.  He has not "failed "FOLFOX, but he has completed 5 treatments with oxaliplatin based therapy.  The chance of responding to FOLFOX is smaller and he will likely reach dose-limiting toxicity with oxaliplatin soon.  We discussed details of the FOLFIRI/panitumumab regimen.  This included a discussion of potential toxicities associated with irinotecan such as the chance of alopecia, hematologic toxicity, nausea/vomiting, and diarrhea.  We reviewed the allergic reaction, diarrhea, and skin rash associated with panitumumab.  Mr Purdum will not agree to treatment at present.  He would like to be sure the pulmonary lesions are metastases as opposed to another malignancy or a benign process.  I explained the very high likelihood these lesions represent metastatic rectal cancer.  He would like to proceed with biopsy of a lung lesion.  I will asked Dr. Valeta Harms to review chest CT to see whether a percutaneous or bronchoscopic approach is best.  Mr Kossman will return for an office visit a few days after the biopsy to discuss the pathology results and treatment options.  Today's visit lasted approximately 60 minutes including face-to-face, documentation, and chart review time.  Bradley Coder, MD  07/05/2021  12:54 PM

## 2021-07-07 ENCOUNTER — Encounter: Payer: Self-pay | Admitting: Oncology

## 2021-07-10 ENCOUNTER — Ambulatory Visit: Payer: Medicaid Other | Admitting: Pulmonary Disease

## 2021-07-10 ENCOUNTER — Telehealth: Payer: Self-pay | Admitting: Pulmonary Disease

## 2021-07-10 ENCOUNTER — Encounter: Payer: Self-pay | Admitting: Pulmonary Disease

## 2021-07-10 ENCOUNTER — Other Ambulatory Visit: Payer: Self-pay

## 2021-07-10 VITALS — BP 138/62 | HR 85 | Temp 97.9°F | Ht 67.0 in | Wt 197.6 lb

## 2021-07-10 DIAGNOSIS — R918 Other nonspecific abnormal finding of lung field: Secondary | ICD-10-CM

## 2021-07-10 DIAGNOSIS — K6289 Other specified diseases of anus and rectum: Secondary | ICD-10-CM | POA: Diagnosis not present

## 2021-07-10 DIAGNOSIS — C20 Malignant neoplasm of rectum: Secondary | ICD-10-CM

## 2021-07-10 NOTE — H&P (View-Only) (Signed)
Synopsis: Referred in September 2022 for multiple lung nodules by Merton Border, PA  Subjective:   PATIENT ID: Bradley Colon GENDER: male DOB: 1971/08/31, MRN: [id number]  Chief Complaint  Patient presents with   Follow-up    F/u on lung nodules     This is a 50 year old gentleman, history of reactive airway disease, chronic back pain, kidney stones.  Recently diagnosed with rectal cancer.  CT imaging now reveals multiple bilateral pulmonary nodules.  From a respiratory standpoint he is doing okay.  He has been sufficiently depressed and tearful over the recent CT imaging that showed enlarging bilateral pulmonary nodules as well as his recent visit with medical oncology.  He understands a he has been referred here today for tissue biopsy.   Past Medical History:  Diagnosis Date   Aggressiveness 12/28/2018   Asthma    Cancer ([place])    Cervical radiculopathy    Chronic back pain    Kidney stones    Reactive airway disease      Family History  Problem Relation Age of Onset   Hypertension Maternal Grandmother    Diabetes Maternal Grandmother    Lung cancer Maternal Grandfather    Breast cancer Paternal Grandmother    Colon cancer Neg Hx    Esophageal cancer Neg Hx    Rectal cancer Neg Hx    Stomach cancer Neg Hx    Pancreatic cancer Neg Hx    Liver disease Neg Hx    Inflammatory bowel disease Neg Hx      Past Surgical History:  Procedure Laterality Date   EPIDURAL BLOCK INJECTION     laproscopic anterior resection for rectal cancer   11/18/2019   SHOULDER SURGERY      Social History   Socioeconomic History   Marital status: Married    Spouse name: Not on file   Number of children: Not on file   Years of education: Not on file   Highest education level: Not on file  Occupational History   Not on file  Tobacco Use   Smoking status: Light Smoker    Types: Cigars   Smokeless tobacco: Never  Vaping Use   Vaping Use: Every day   Substances: CBD  Substance and  Sexual Activity   Alcohol use: No    Comment:     Drug use: Yes    Types: Marijuana    Comment: States marijuana at 12 midnight   Sexual activity: Not on file  Other Topics Concern   Not on file  Social History Narrative   Lives with partner   Has 2 sons with autism   1 daughter with ADD   Vegetarian   Social Determinants of Health   Financial Resource Strain: High Risk   Difficulty of Paying Living Expenses: Very hard  Food Insecurity: Not on file  Transportation Needs: Not on file  Physical Activity: Not on file  Stress: Stress Concern Present   Feeling of Stress : Very much  Social Connections: Not on file  Intimate Partner Violence: Not on file     Allergies  Allergen Reactions   Bee Pollen Anaphylaxis and Swelling   Bee Venom Anaphylaxis and Swelling    Pt has been stung by honey bees since he became a vegetarian at age 19 yrs old with no reaction.   Other Shortness Of Breath and Itching    Reaction to cut grass, pet dander   Contrast Media [Iodinated Diagnostic Agents] Hives   Shrimp [Shellfish Allergy] Swelling  and Other (See Comments)    Muscle cramps   Vancomycin Itching    Reaction to IV - pt states no reaction if it is infused slowly   Wheat Bran Swelling    Some swelling   Latex Hives and Rash    Reaction to fumes from latex paint     Outpatient Medications Prior to Visit  Medication Sig Dispense Refill   albuterol (VENTOLIN HFA) 108 (90 Base) MCG/ACT inhaler INHALE 2 PUFFS INTO THE LUNGS EVERY 6 (SIX) HOURS AS NEEDED FOR WHEEZING OR SHORTNESS OF BREATH. 8.5 g 1   baclofen (LIORESAL) 10 MG tablet Take 1 tablet (10 mg total) by mouth 3 (three) times daily as needed for rectal pain and spasm. 30 each 3   calcium carbonate (TUMS - DOSED IN MG ELEMENTAL CALCIUM) 500 MG chewable tablet Chew 1 tablet by mouth daily.      diazepam (VALIUM) 10 MG tablet Take 1 tablet (10 mg total) by mouth every 12 (twelve) hours as needed for anxiety. 30 tablet 1   Docusate  Sodium (DSS) 100 MG CAPS Take 100 mg by mouth in the morning and at bedtime.     gabapentin (NEURONTIN) 600 MG tablet Take 1 tablet (600 mg total) by mouth 3 (three) times daily. 90 tablet 3   hydrOXYzine (VISTARIL) 50 MG capsule Take 1 capsule (50 mg total) by mouth 3 (three) times daily as needed for anxiety or itching. 60 capsule 2   morphine (MS CONTIN) 60 MG 12 hr tablet Take 1 tablet (60 mg total) by mouth every 12 (twelve) hours. 60 tablet 0   naloxegol oxalate (MOVANTIK) 25 MG TABS tablet Take 1 tablet (25 mg total) by mouth daily. For Severe Constipation 30 tablet 1   Nitroglycerin (RECTIV) 0.4 % OINT Place 1 application rectally daily.     NON FORMULARY Take 2-3 capsules by mouth daily. Turmeric and Boswella     NONFORMULARY OR COMPOUNDED ITEM Place rectally every 6 (six) hours. Nifedipine-lidocaine 0.3-1.5% rectal ointment     Nutritional Supplements (ENSURE PLANT-BASED PROTEIN) LIQD Take by mouth 2 (two) times daily.      oxyCODONE (OXY IR/ROXICODONE) 5 MG immediate release tablet Take by mouth.     phenazopyridine (PYRIDIUM) 100 MG tablet Take 1 tablet (100 mg total) by mouth 3 (three) times daily as needed for pain (Pain with urination). 30 tablet 3   tamsulosin (FLOMAX) 0.4 MG CAPS capsule Take 2 capsules (0.8 mg total) by mouth daily. 60 capsule 3   Clobetasol Prop Emollient Base (CLOBETASOL PROPIONATE E) 0.05 % emollient cream Apply 1 application topically 2 (two) times daily. (Patient not taking: Reported on 07/10/2021) 30 g 0   dexamethasone (DECADRON) 4 MG tablet Take 1 tablet (4 mg total) by mouth daily. (Patient not taking: Reported on 07/10/2021) 15 tablet 2   lidocaine-prilocaine (EMLA) cream Apply 1 application topically as directed. Apply to port 1 hour prior to stick and cover with plastic wrap (Patient not taking: Reported on 07/10/2021) 30 g 2   polyethylene glycol (GOLYTELY) 236 g solution Take by mouth as directed. (Patient not taking: Reported on 07/10/2021)      pramoxine-hydrocortisone (PROCTOCREAM-HC) 1-1 % rectal cream APPLY 1 APPLICATION RECTALLY 2 (TWO) TIMES DAILY AS NEEDED FOR HEMORRHOIDS OR ANAL ITCHING. (Patient not taking: No sig reported) 60 g 1   No facility-administered medications prior to visit.    Review of Systems  Constitutional:  Negative for chills, fever, malaise/fatigue and weight loss.  HENT:  Negative for  hearing loss, sore throat and tinnitus.   Eyes:  Negative for blurred vision and double vision.  Respiratory:  Negative for cough, hemoptysis, sputum production, shortness of breath, wheezing and stridor.   Cardiovascular:  Negative for chest pain, palpitations, orthopnea, leg swelling and PND.  Gastrointestinal:  Negative for abdominal pain, constipation, diarrhea, heartburn, nausea and vomiting.  Genitourinary:  Negative for dysuria, hematuria and urgency.  Musculoskeletal:  Negative for joint pain and myalgias.  Skin:  Negative for itching and rash.  Neurological:  Negative for dizziness, tingling, weakness and headaches.  Endo/Heme/Allergies:  Negative for environmental allergies. Does not bruise/bleed easily.  Psychiatric/Behavioral:  Negative for depression. The patient is not nervous/anxious and does not have insomnia.   All other systems reviewed and are negative.   Objective:  Physical Exam Vitals reviewed.  Constitutional:      General: He is not in acute distress.    Appearance: He is well-developed.  HENT:     Head: Normocephalic and atraumatic.  Eyes:     General: No scleral icterus.    Conjunctiva/sclera: Conjunctivae normal.     Pupils: Pupils are equal, round, and reactive to light.  Neck:     Vascular: No JVD.     Trachea: No tracheal deviation.  Cardiovascular:     Rate and Rhythm: Normal rate and regular rhythm.     Heart sounds: Normal heart sounds. No murmur heard. Pulmonary:     Effort: Pulmonary effort is normal. No tachypnea, accessory muscle usage or respiratory distress.     Breath  sounds: No stridor. No wheezing, rhonchi or rales.  Abdominal:     General: Bowel sounds are normal. There is no distension.     Palpations: Abdomen is soft.     Tenderness: There is no abdominal tenderness.  Musculoskeletal:        General: No tenderness.     Cervical back: Neck supple.  Lymphadenopathy:     Cervical: No cervical adenopathy.  Skin:    General: Skin is warm and dry.     Capillary Refill: Capillary refill takes less than 2 seconds.     Findings: No rash.  Neurological:     Mental Status: He is alert and oriented to person, place, and time.  Psychiatric:        Behavior: Behavior normal.     Vitals:   07/10/21 1342  BP: 138/62  Pulse: 85  Temp: 97.9 F (36.6 C)  TempSrc: Oral  SpO2: 96%  Weight: 197 lb 9.6 oz (89.6 kg)  Height: 5\' 7"  (1.702 m)   96% on RA BMI Readings from Last 3 Encounters:  07/10/21 30.95 kg/m  07/05/21 32.95 kg/m  05/21/21 32.23 kg/m   Wt Readings from Last 3 Encounters:  07/10/21 197 lb 9.6 oz (89.6 kg)  07/05/21 210 lb 6.4 oz (95.4 kg)  05/21/21 205 lb 12.8 oz (93.4 kg)     CBC    Component Value Date/Time   WBC 9.1 05/21/2021 1424   WBC 5.7 04/15/2021 0655   RBC 4.57 05/21/2021 1424   HGB 13.2 05/21/2021 1424   HCT 40.7 05/21/2021 1424   PLT 362 05/21/2021 1424   MCV 89.1 05/21/2021 1424   MCH 28.9 05/21/2021 1424   MCHC 32.4 05/21/2021 1424   RDW 13.7 05/21/2021 1424   LYMPHSABS 2.6 05/21/2021 1424   MONOABS 1.1 (H) 05/21/2021 1424   EOSABS 0.1 05/21/2021 1424   BASOSABS 0.0 05/21/2021 1424    Chest Imaging: 07/02/2021 CT chest abdomen pelvis:  Bilateral multiple innumerable amounts of pulmonary nodules concerning for metastatic disease. The patient's images have been independently reviewed by me.    Pulmonary Functions Testing Results: No flowsheet data found.  FeNO:   Pathology:   Echocardiogram:   Heart Catheterization:     Assessment & Plan:     ICD-10-CM   1. Lung nodules  R91.8 Ambulatory  referral to Pulmonology    Procedural/ Surgical Case Request: VIDEO BRONCHOSCOPY WITH ENDOBRONCHIAL NAVIGATION    CT Super D Chest Wo Contrast    2. Rectal cancer ([place])  C20     3. Rectal pain  K62.89       Discussion:  This is a 50 year old gentleman with rectal cancer, he has multiple pulmonary nodules within the chest cavity concerning for metastatic disease.  Plan: Patient was referred today for discussion of biopsy. Medical oncology would like tissue sampling of the bilateral pulmonary nodules. The patient would also like sampling of the nodules as he understands that the images look like metastatic disease but he has having a hard time with the new possible diagnosis. He would like proof that he does have metastatic rectal cancer before initiating systemic chemotherapy. I discussed the risk benefits and alternatives of biopsy. We will plan for a minimally invasive navigational bronchoscopy with nodule sampling.  Bronchoscopy has been tentatively scheduled for 07/18/2021.    Current Outpatient Medications:    albuterol (VENTOLIN HFA) 108 (90 Base) MCG/ACT inhaler, INHALE 2 PUFFS INTO THE LUNGS EVERY 6 (SIX) HOURS AS NEEDED FOR WHEEZING OR SHORTNESS OF BREATH., Disp: 8.5 g, Rfl: 1   baclofen (LIORESAL) 10 MG tablet, Take 1 tablet (10 mg total) by mouth 3 (three) times daily as needed for rectal pain and spasm., Disp: 30 each, Rfl: 3   calcium carbonate (TUMS - DOSED IN MG ELEMENTAL CALCIUM) 500 MG chewable tablet, Chew 1 tablet by mouth daily. , Disp: , Rfl:    diazepam (VALIUM) 10 MG tablet, Take 1 tablet (10 mg total) by mouth every 12 (twelve) hours as needed for anxiety., Disp: 30 tablet, Rfl: 1   Docusate Sodium (DSS) 100 MG CAPS, Take 100 mg by mouth in the morning and at bedtime., Disp: , Rfl:    gabapentin (NEURONTIN) 600 MG tablet, Take 1 tablet (600 mg total) by mouth 3 (three) times daily., Disp: 90 tablet, Rfl: 3   hydrOXYzine (VISTARIL) 50 MG capsule, Take 1 capsule  (50 mg total) by mouth 3 (three) times daily as needed for anxiety or itching., Disp: 60 capsule, Rfl: 2   morphine (MS CONTIN) 60 MG 12 hr tablet, Take 1 tablet (60 mg total) by mouth every 12 (twelve) hours., Disp: 60 tablet, Rfl: 0   naloxegol oxalate (MOVANTIK) 25 MG TABS tablet, Take 1 tablet (25 mg total) by mouth daily. For Severe Constipation, Disp: 30 tablet, Rfl: 1   Nitroglycerin (RECTIV) 0.4 % OINT, Place 1 application rectally daily., Disp: , Rfl:    NON FORMULARY, Take 2-3 capsules by mouth daily. Turmeric and Boswella, Disp: , Rfl:    NONFORMULARY OR COMPOUNDED ITEM, Place rectally every 6 (six) hours. Nifedipine-lidocaine 0.3-1.5% rectal ointment, Disp: , Rfl:    Nutritional Supplements (ENSURE PLANT-BASED PROTEIN) LIQD, Take by mouth 2 (two) times daily. , Disp: , Rfl:    oxyCODONE (OXY IR/ROXICODONE) 5 MG immediate release tablet, Take by mouth., Disp: , Rfl:    phenazopyridine (PYRIDIUM) 100 MG tablet, Take 1 tablet (100 mg total) by mouth 3 (three) times daily as needed for pain (  Pain with urination)., Disp: 30 tablet, Rfl: 3   tamsulosin (FLOMAX) 0.4 MG CAPS capsule, Take 2 capsules (0.8 mg total) by mouth daily., Disp: 60 capsule, Rfl: 3   Clobetasol Prop Emollient Base (CLOBETASOL PROPIONATE E) 0.05 % emollient cream, Apply 1 application topically 2 (two) times daily. (Patient not taking: Reported on 07/10/2021), Disp: 30 g, Rfl: 0   dexamethasone (DECADRON) 4 MG tablet, Take 1 tablet (4 mg total) by mouth daily. (Patient not taking: Reported on 07/10/2021), Disp: 15 tablet, Rfl: 2   lidocaine-prilocaine (EMLA) cream, Apply 1 application topically as directed. Apply to port 1 hour prior to stick and cover with plastic wrap (Patient not taking: Reported on 07/10/2021), Disp: 30 g, Rfl: 2   polyethylene glycol (GOLYTELY) 236 g solution, Take by mouth as directed. (Patient not taking: Reported on 07/10/2021), Disp: , Rfl:    pramoxine-hydrocortisone (PROCTOCREAM-HC) 1-1 % rectal cream,  APPLY 1 APPLICATION RECTALLY 2 (TWO) TIMES DAILY AS NEEDED FOR HEMORRHOIDS OR ANAL ITCHING. (Patient not taking: No sig reported), Disp: 60 g, Rfl: 1  I spent 63 minutes dedicated to the care of this patient on the date of this encounter to include pre-visit review of records, face-to-face time with the patient discussing conditions above, post visit ordering of testing, clinical documentation with the electronic health record, making appropriate referrals as documented, and communicating necessary findings to members of the patients care team.   Garner Nash, [facility] 07/10/2021 1:51 PM

## 2021-07-10 NOTE — Patient Instructions (Signed)
Thank you for visiting Dr. Valeta Harms at [facility]. Today we recommend the following:  Orders Placed This Encounter  Procedures   Procedural/ Surgical Case Request: [place]   Ambulatory referral to Pulmonology   Return if symptoms worsen or fail to improve.  Planned bronchoscopy on 07/18/2021    Please do your part to reduce the spread of COVID-19.

## 2021-07-10 NOTE — Progress Notes (Signed)
Synopsis: Referred in September 2022 for multiple lung nodules by Merton Border, PA  Subjective:   PATIENT ID: Bradley Colon GENDER: male DOB: [birth year], MRN: [id number]  Chief Complaint  Patient presents with   Follow-up    F/u on lung nodules     This is a 50 year old gentleman, history of reactive airway disease, chronic back pain, kidney stones.  Recently diagnosed with rectal cancer.  CT imaging now reveals multiple bilateral pulmonary nodules.  From a respiratory standpoint he is doing okay.  He has been sufficiently depressed and tearful over the recent CT imaging that showed enlarging bilateral pulmonary nodules as well as his recent visit with medical oncology.  He understands a he has been referred here today for tissue biopsy.   Past Medical History:  Diagnosis Date   Aggressiveness 12/28/2018   Asthma    Cancer ([place])    Cervical radiculopathy    Chronic back pain    Kidney stones    Reactive airway disease      Family History  Problem Relation Age of Onset   Hypertension Maternal Grandmother    Diabetes Maternal Grandmother    Lung cancer Maternal Grandfather    Breast cancer Paternal Grandmother    Colon cancer Neg Hx    Esophageal cancer Neg Hx    Rectal cancer Neg Hx    Stomach cancer Neg Hx    Pancreatic cancer Neg Hx    Liver disease Neg Hx    Inflammatory bowel disease Neg Hx      Past Surgical History:  Procedure Laterality Date   EPIDURAL BLOCK INJECTION     laproscopic anterior resection for rectal cancer   11/18/2019   SHOULDER SURGERY      Social History   Socioeconomic History   Marital status: Married    Spouse name: Not on file   Number of children: Not on file   Years of education: Not on file   Highest education level: Not on file  Occupational History   Not on file  Tobacco Use   Smoking status: Light Smoker    Types: Cigars   Smokeless tobacco: Never  Vaping Use   Vaping Use: Every day   Substances: CBD  Substance and  Sexual Activity   Alcohol use: No    Comment:     Drug use: Yes    Types: Marijuana    Comment: States marijuana at 12 midnight   Sexual activity: Not on file  Other Topics Concern   Not on file  Social History Narrative   Lives with partner   Has 2 sons with autism   1 daughter with ADD   Vegetarian   Social Determinants of Health   Financial Resource Strain: High Risk   Difficulty of Paying Living Expenses: Very hard  Food Insecurity: Not on file  Transportation Needs: Not on file  Physical Activity: Not on file  Stress: Stress Concern Present   Feeling of Stress : Very much  Social Connections: Not on file  Intimate Partner Violence: Not on file     Allergies  Allergen Reactions   Bee Pollen Anaphylaxis and Swelling   Bee Venom Anaphylaxis and Swelling    Pt has been stung by honey bees since he became a vegetarian at age 60 yrs old with no reaction.   Other Shortness Of Breath and Itching    Reaction to cut grass, pet dander   Contrast Media [Iodinated Diagnostic Agents] Hives   Shrimp [Shellfish Allergy] Swelling  and Other (See Comments)    Muscle cramps   Vancomycin Itching    Reaction to IV - pt states no reaction if it is infused slowly   Wheat Bran Swelling    Some swelling   Latex Hives and Rash    Reaction to fumes from latex paint     Outpatient Medications Prior to Visit  Medication Sig Dispense Refill   albuterol (VENTOLIN HFA) 108 (90 Base) MCG/ACT inhaler INHALE 2 PUFFS INTO THE LUNGS EVERY 6 (SIX) HOURS AS NEEDED FOR WHEEZING OR SHORTNESS OF BREATH. 8.5 g 1   baclofen (LIORESAL) 10 MG tablet Take 1 tablet (10 mg total) by mouth 3 (three) times daily as needed for rectal pain and spasm. 30 each 3   calcium carbonate (TUMS - DOSED IN MG ELEMENTAL CALCIUM) 500 MG chewable tablet Chew 1 tablet by mouth daily.      diazepam (VALIUM) 10 MG tablet Take 1 tablet (10 mg total) by mouth every 12 (twelve) hours as needed for anxiety. 30 tablet 1   Docusate  Sodium (DSS) 100 MG CAPS Take 100 mg by mouth in the morning and at bedtime.     gabapentin (NEURONTIN) 600 MG tablet Take 1 tablet (600 mg total) by mouth 3 (three) times daily. 90 tablet 3   hydrOXYzine (VISTARIL) 50 MG capsule Take 1 capsule (50 mg total) by mouth 3 (three) times daily as needed for anxiety or itching. 60 capsule 2   morphine (MS CONTIN) 60 MG 12 hr tablet Take 1 tablet (60 mg total) by mouth every 12 (twelve) hours. 60 tablet 0   naloxegol oxalate (MOVANTIK) 25 MG TABS tablet Take 1 tablet (25 mg total) by mouth daily. For Severe Constipation 30 tablet 1   Nitroglycerin (RECTIV) 0.4 % OINT Place 1 application rectally daily.     NON FORMULARY Take 2-3 capsules by mouth daily. Turmeric and Boswella     NONFORMULARY OR COMPOUNDED ITEM Place rectally every 6 (six) hours. Nifedipine-lidocaine 0.3-1.5% rectal ointment     Nutritional Supplements (ENSURE PLANT-BASED PROTEIN) LIQD Take by mouth 2 (two) times daily.      oxyCODONE (OXY IR/ROXICODONE) 5 MG immediate release tablet Take by mouth.     phenazopyridine (PYRIDIUM) 100 MG tablet Take 1 tablet (100 mg total) by mouth 3 (three) times daily as needed for pain (Pain with urination). 30 tablet 3   tamsulosin (FLOMAX) 0.4 MG CAPS capsule Take 2 capsules (0.8 mg total) by mouth daily. 60 capsule 3   Clobetasol Prop Emollient Base (CLOBETASOL PROPIONATE E) 0.05 % emollient cream Apply 1 application topically 2 (two) times daily. (Patient not taking: Reported on 07/10/2021) 30 g 0   dexamethasone (DECADRON) 4 MG tablet Take 1 tablet (4 mg total) by mouth daily. (Patient not taking: Reported on 07/10/2021) 15 tablet 2   lidocaine-prilocaine (EMLA) cream Apply 1 application topically as directed. Apply to port 1 hour prior to stick and cover with plastic wrap (Patient not taking: Reported on 07/10/2021) 30 g 2   polyethylene glycol (GOLYTELY) 236 g solution Take by mouth as directed. (Patient not taking: Reported on 07/10/2021)      pramoxine-hydrocortisone (PROCTOCREAM-HC) 1-1 % rectal cream APPLY 1 APPLICATION RECTALLY 2 (TWO) TIMES DAILY AS NEEDED FOR HEMORRHOIDS OR ANAL ITCHING. (Patient not taking: No sig reported) 60 g 1   No facility-administered medications prior to visit.    Review of Systems  Constitutional:  Negative for chills, fever, malaise/fatigue and weight loss.  HENT:  Negative for  hearing loss, sore throat and tinnitus.   Eyes:  Negative for blurred vision and double vision.  Respiratory:  Negative for cough, hemoptysis, sputum production, shortness of breath, wheezing and stridor.   Cardiovascular:  Negative for chest pain, palpitations, orthopnea, leg swelling and PND.  Gastrointestinal:  Negative for abdominal pain, constipation, diarrhea, heartburn, nausea and vomiting.  Genitourinary:  Negative for dysuria, hematuria and urgency.  Musculoskeletal:  Negative for joint pain and myalgias.  Skin:  Negative for itching and rash.  Neurological:  Negative for dizziness, tingling, weakness and headaches.  Endo/Heme/Allergies:  Negative for environmental allergies. Does not bruise/bleed easily.  Psychiatric/Behavioral:  Negative for depression. The patient is not nervous/anxious and does not have insomnia.   All other systems reviewed and are negative.   Objective:  Physical Exam Vitals reviewed.  Constitutional:      General: He is not in acute distress.    Appearance: He is well-developed.  HENT:     Head: Normocephalic and atraumatic.  Eyes:     General: No scleral icterus.    Conjunctiva/sclera: Conjunctivae normal.     Pupils: Pupils are equal, round, and reactive to light.  Neck:     Vascular: No JVD.     Trachea: No tracheal deviation.  Cardiovascular:     Rate and Rhythm: Normal rate and regular rhythm.     Heart sounds: Normal heart sounds. No murmur heard. Pulmonary:     Effort: Pulmonary effort is normal. No tachypnea, accessory muscle usage or respiratory distress.     Breath  sounds: No stridor. No wheezing, rhonchi or rales.  Abdominal:     General: Bowel sounds are normal. There is no distension.     Palpations: Abdomen is soft.     Tenderness: There is no abdominal tenderness.  Musculoskeletal:        General: No tenderness.     Cervical back: Neck supple.  Lymphadenopathy:     Cervical: No cervical adenopathy.  Skin:    General: Skin is warm and dry.     Capillary Refill: Capillary refill takes less than 2 seconds.     Findings: No rash.  Neurological:     Mental Status: He is alert and oriented to person, place, and time.  Psychiatric:        Behavior: Behavior normal.     Vitals:   07/10/21 1342  BP: 138/62  Pulse: 85  Temp: 97.9 F (36.6 C)  TempSrc: Oral  SpO2: 96%  Weight: 197 lb 9.6 oz (89.6 kg)  Height: 5\' 7"  (1.702 m)   96% on RA BMI Readings from Last 3 Encounters:  07/10/21 30.95 kg/m  07/05/21 32.95 kg/m  05/21/21 32.23 kg/m   Wt Readings from Last 3 Encounters:  07/10/21 197 lb 9.6 oz (89.6 kg)  07/05/21 210 lb 6.4 oz (95.4 kg)  05/21/21 205 lb 12.8 oz (93.4 kg)     CBC    Component Value Date/Time   WBC 9.1 05/21/2021 1424   WBC 5.7 04/15/2021 0655   RBC 4.57 05/21/2021 1424   HGB 13.2 05/21/2021 1424   HCT 40.7 05/21/2021 1424   PLT 362 05/21/2021 1424   MCV 89.1 05/21/2021 1424   MCH 28.9 05/21/2021 1424   MCHC 32.4 05/21/2021 1424   RDW 13.7 05/21/2021 1424   LYMPHSABS 2.6 05/21/2021 1424   MONOABS 1.1 (H) 05/21/2021 1424   EOSABS 0.1 05/21/2021 1424   BASOSABS 0.0 05/21/2021 1424    Chest Imaging: 07/02/2021 CT chest abdomen pelvis:  Bilateral multiple innumerable amounts of pulmonary nodules concerning for metastatic disease. The patient's images have been independently reviewed by me.    Pulmonary Functions Testing Results: No flowsheet data found.  FeNO:   Pathology:   Echocardiogram:   Heart Catheterization:     Assessment & Plan:     ICD-10-CM   1. Lung nodules  R91.8 Ambulatory  referral to Pulmonology    Procedural/ Surgical Case Request: VIDEO BRONCHOSCOPY WITH ENDOBRONCHIAL NAVIGATION    CT Super D Chest Wo Contrast    2. Rectal cancer ([place])  C20     3. Rectal pain  K62.89       Discussion:  This is a 50 year old gentleman with rectal cancer, he has multiple pulmonary nodules within the chest cavity concerning for metastatic disease.  Plan: Patient was referred today for discussion of biopsy. Medical oncology would like tissue sampling of the bilateral pulmonary nodules. The patient would also like sampling of the nodules as he understands that the images look like metastatic disease but he has having a hard time with the new possible diagnosis. He would like proof that he does have metastatic rectal cancer before initiating systemic chemotherapy. I discussed the risk benefits and alternatives of biopsy. We will plan for a minimally invasive navigational bronchoscopy with nodule sampling.  Bronchoscopy has been tentatively scheduled for 07/18/2021.    Current Outpatient Medications:    albuterol (VENTOLIN HFA) 108 (90 Base) MCG/ACT inhaler, INHALE 2 PUFFS INTO THE LUNGS EVERY 6 (SIX) HOURS AS NEEDED FOR WHEEZING OR SHORTNESS OF BREATH., Disp: 8.5 g, Rfl: 1   baclofen (LIORESAL) 10 MG tablet, Take 1 tablet (10 mg total) by mouth 3 (three) times daily as needed for rectal pain and spasm., Disp: 30 each, Rfl: 3   calcium carbonate (TUMS - DOSED IN MG ELEMENTAL CALCIUM) 500 MG chewable tablet, Chew 1 tablet by mouth daily. , Disp: , Rfl:    diazepam (VALIUM) 10 MG tablet, Take 1 tablet (10 mg total) by mouth every 12 (twelve) hours as needed for anxiety., Disp: 30 tablet, Rfl: 1   Docusate Sodium (DSS) 100 MG CAPS, Take 100 mg by mouth in the morning and at bedtime., Disp: , Rfl:    gabapentin (NEURONTIN) 600 MG tablet, Take 1 tablet (600 mg total) by mouth 3 (three) times daily., Disp: 90 tablet, Rfl: 3   hydrOXYzine (VISTARIL) 50 MG capsule, Take 1 capsule  (50 mg total) by mouth 3 (three) times daily as needed for anxiety or itching., Disp: 60 capsule, Rfl: 2   morphine (MS CONTIN) 60 MG 12 hr tablet, Take 1 tablet (60 mg total) by mouth every 12 (twelve) hours., Disp: 60 tablet, Rfl: 0   naloxegol oxalate (MOVANTIK) 25 MG TABS tablet, Take 1 tablet (25 mg total) by mouth daily. For Severe Constipation, Disp: 30 tablet, Rfl: 1   Nitroglycerin (RECTIV) 0.4 % OINT, Place 1 application rectally daily., Disp: , Rfl:    NON FORMULARY, Take 2-3 capsules by mouth daily. Turmeric and Boswella, Disp: , Rfl:    NONFORMULARY OR COMPOUNDED ITEM, Place rectally every 6 (six) hours. Nifedipine-lidocaine 0.3-1.5% rectal ointment, Disp: , Rfl:    Nutritional Supplements (ENSURE PLANT-BASED PROTEIN) LIQD, Take by mouth 2 (two) times daily. , Disp: , Rfl:    oxyCODONE (OXY IR/ROXICODONE) 5 MG immediate release tablet, Take by mouth., Disp: , Rfl:    phenazopyridine (PYRIDIUM) 100 MG tablet, Take 1 tablet (100 mg total) by mouth 3 (three) times daily as needed for pain (  Pain with urination)., Disp: 30 tablet, Rfl: 3   tamsulosin (FLOMAX) 0.4 MG CAPS capsule, Take 2 capsules (0.8 mg total) by mouth daily., Disp: 60 capsule, Rfl: 3   Clobetasol Prop Emollient Base (CLOBETASOL PROPIONATE E) 0.05 % emollient cream, Apply 1 application topically 2 (two) times daily. (Patient not taking: Reported on 07/10/2021), Disp: 30 g, Rfl: 0   dexamethasone (DECADRON) 4 MG tablet, Take 1 tablet (4 mg total) by mouth daily. (Patient not taking: Reported on 07/10/2021), Disp: 15 tablet, Rfl: 2   lidocaine-prilocaine (EMLA) cream, Apply 1 application topically as directed. Apply to port 1 hour prior to stick and cover with plastic wrap (Patient not taking: Reported on 07/10/2021), Disp: 30 g, Rfl: 2   polyethylene glycol (GOLYTELY) 236 g solution, Take by mouth as directed. (Patient not taking: Reported on 07/10/2021), Disp: , Rfl:    pramoxine-hydrocortisone (PROCTOCREAM-HC) 1-1 % rectal cream,  APPLY 1 APPLICATION RECTALLY 2 (TWO) TIMES DAILY AS NEEDED FOR HEMORRHOIDS OR ANAL ITCHING. (Patient not taking: No sig reported), Disp: 60 g, Rfl: 1  I spent 63 minutes dedicated to the care of this patient on the date of this encounter to include pre-visit review of records, face-to-face time with the patient discussing conditions above, post visit ordering of testing, clinical documentation with the electronic health record, making appropriate referrals as documented, and communicating necessary findings to members of the patients care team.   Garner Nash, [facility] 07/10/2021 1:51 PM

## 2021-07-11 ENCOUNTER — Other Ambulatory Visit (HOSPITAL_COMMUNITY): Payer: Self-pay

## 2021-07-11 ENCOUNTER — Encounter: Payer: Self-pay | Admitting: *Deleted

## 2021-07-11 ENCOUNTER — Other Ambulatory Visit: Payer: Self-pay | Admitting: Internal Medicine

## 2021-07-11 MED ORDER — MORPHINE SULFATE ER 60 MG PO TBCR
60.0000 mg | EXTENDED_RELEASE_TABLET | Freq: Two times a day (BID) | ORAL | 0 refills | Status: DC
  Filled 2021-07-11: qty 60, 30d supply, fill #0

## 2021-07-11 MED ORDER — BACLOFEN 10 MG PO TABS
10.0000 mg | ORAL_TABLET | Freq: Three times a day (TID) | ORAL | 3 refills | Status: DC | PRN
  Filled 2021-07-11: qty 30, 10d supply, fill #0

## 2021-07-11 MED ORDER — GABAPENTIN 600 MG PO TABS
600.0000 mg | ORAL_TABLET | Freq: Two times a day (BID) | ORAL | 3 refills | Status: DC
  Filled 2021-07-11: qty 90, 45d supply, fill #0

## 2021-07-11 MED ORDER — TAMSULOSIN HCL 0.4 MG PO CAPS
0.8000 mg | ORAL_CAPSULE | Freq: Every day | ORAL | 3 refills | Status: DC
  Filled 2021-07-11 – 2021-09-21 (×2): qty 60, 30d supply, fill #0

## 2021-07-11 MED ORDER — DIAZEPAM 10 MG PO TABS
10.0000 mg | ORAL_TABLET | Freq: Two times a day (BID) | ORAL | 1 refills | Status: DC | PRN
  Filled 2021-07-11: qty 30, 15d supply, fill #0

## 2021-07-11 MED ORDER — OXYCODONE HCL 20 MG PO TABS
20.0000 mg | ORAL_TABLET | Freq: Three times a day (TID) | ORAL | 0 refills | Status: DC | PRN
  Filled 2021-07-11: qty 90, 30d supply, fill #0

## 2021-07-11 NOTE — Progress Notes (Signed)
Refill approved for cancer pain regimen. Last Fill 8/10. PDMP reviewed. Mr. Bradley Colon has cancer progression on recent imaging since he has been of treatment since 3/22. Dr. Benay Spice has recommend resuming systemic treatment as soon as possible, but based on last note, Mr. Bradley Colon wants confirmation that the lung lesions are not something other than cancer, so an appointment has been set up with Dr. Valeta Harms for possible lung biopsy. Since the lung lesions initially responded well to chemotherapy, I will continue to encourage treatment without delay for his metastatic disease, without treatment I anticipate his needs for symptom management will continue to increase and he will hospice care.  Current Recommendations: Oxycodone 20mg  TID PRN Pain MSContin 60mg  BID  Gabapentin 600mg  BID Movantik for Opioid Induced Constipation Flomax for vesicoureteral reflux and pyridium PRN  Valium as needed for anxiety. Hydroxyzine for skin itching and rash prn.  Bradley Hacker, DO Palliative Medicine

## 2021-07-11 NOTE — Progress Notes (Signed)
Patient scheduled for lung biopsy on 07/18/21. Per Dr. Benay Spice, schedule OV for 9/19 at 3:30 pm for 1 hour. High priority scheduling message sent.

## 2021-07-12 ENCOUNTER — Telehealth: Payer: Self-pay | Admitting: Oncology

## 2021-07-12 NOTE — Telephone Encounter (Signed)
Scheduled appt per 9/8 sch msg - patient is aware of appt on 9/19

## 2021-07-15 ENCOUNTER — Encounter (HOSPITAL_COMMUNITY): Payer: Self-pay | Admitting: Pulmonary Disease

## 2021-07-16 ENCOUNTER — Encounter (HOSPITAL_COMMUNITY): Payer: Self-pay | Admitting: Pulmonary Disease

## 2021-07-16 ENCOUNTER — Other Ambulatory Visit: Payer: Self-pay

## 2021-07-16 ENCOUNTER — Ambulatory Visit (HOSPITAL_COMMUNITY): Payer: Medicaid Other

## 2021-07-16 NOTE — Progress Notes (Signed)
Spoke with pt for pre-op call. Pt told me he thought this procedure was going to be cancelled because insurance didn't approve the CT Scan he was supposed to have had done today. I private messaged Dr. Valeta Harms and he was not aware that the insurance had not been approved. He stated that the procedure will be done on Thursday and he had his staff reschedule the CT scan. Pt was relieved to hear this. Pt denies cardiac history, HTN or Diabetes. Pt has history of cancer and still receiving treatment.  Pt had a Covid test appt today but he did not go because he thought the procedure was going to be cancelled. He states he will go for the Covid test tomorrow.

## 2021-07-17 ENCOUNTER — Other Ambulatory Visit: Payer: Self-pay | Admitting: Pulmonary Disease

## 2021-07-17 ENCOUNTER — Encounter: Payer: Self-pay | Admitting: Oncology

## 2021-07-17 ENCOUNTER — Ambulatory Visit (HOSPITAL_COMMUNITY)
Admission: RE | Admit: 2021-07-17 | Discharge: 2021-07-17 | Disposition: A | Discharge status: Home / Self Care | Payer: Medicaid Other | Source: Ambulatory Visit | Attending: Pulmonary Disease | Admitting: Pulmonary Disease

## 2021-07-17 DIAGNOSIS — R918 Other nonspecific abnormal finding of lung field: Secondary | ICD-10-CM | POA: Diagnosis not present

## 2021-07-17 LAB — SARS CORONAVIRUS 2 (TAT 6-24 HRS): SARS Coronavirus 2: NEGATIVE

## 2021-07-17 NOTE — Progress Notes (Signed)
pt voiced understanding of arrival time of 0700

## 2021-07-18 ENCOUNTER — Ambulatory Visit (HOSPITAL_COMMUNITY): Payer: Medicaid Other | Admitting: Certified Registered Nurse Anesthetist

## 2021-07-18 ENCOUNTER — Ambulatory Visit (HOSPITAL_COMMUNITY): Payer: Medicaid Other

## 2021-07-18 ENCOUNTER — Encounter: Payer: Self-pay | Admitting: Oncology

## 2021-07-18 ENCOUNTER — Encounter (HOSPITAL_COMMUNITY)
Admission: RE | Disposition: A | Discharge status: Home / Self Care | Payer: Self-pay | Source: Home / Self Care | Attending: Pulmonary Disease

## 2021-07-18 ENCOUNTER — Other Ambulatory Visit: Payer: Self-pay

## 2021-07-18 ENCOUNTER — Ambulatory Visit (HOSPITAL_COMMUNITY)
Admission: RE | Admit: 2021-07-18 | Discharge: 2021-07-18 | Disposition: A | Discharge status: Home / Self Care | Payer: Medicaid Other | Attending: Pulmonary Disease | Admitting: Pulmonary Disease

## 2021-07-18 ENCOUNTER — Encounter (HOSPITAL_COMMUNITY): Payer: Self-pay | Admitting: Pulmonary Disease

## 2021-07-18 DIAGNOSIS — Z91041 Radiographic dye allergy status: Secondary | ICD-10-CM | POA: Insufficient documentation

## 2021-07-18 DIAGNOSIS — Z881 Allergy status to other antibiotic agents status: Secondary | ICD-10-CM | POA: Insufficient documentation

## 2021-07-18 DIAGNOSIS — C3431 Malignant neoplasm of lower lobe, right bronchus or lung: Secondary | ICD-10-CM | POA: Diagnosis present

## 2021-07-18 DIAGNOSIS — Z419 Encounter for procedure for purposes other than remedying health state, unspecified: Secondary | ICD-10-CM

## 2021-07-18 DIAGNOSIS — R918 Other nonspecific abnormal finding of lung field: Secondary | ICD-10-CM | POA: Diagnosis not present

## 2021-07-18 DIAGNOSIS — C7801 Secondary malignant neoplasm of right lung: Secondary | ICD-10-CM | POA: Diagnosis not present

## 2021-07-18 DIAGNOSIS — Z91040 Latex allergy status: Secondary | ICD-10-CM | POA: Diagnosis not present

## 2021-07-18 DIAGNOSIS — F17210 Nicotine dependence, cigarettes, uncomplicated: Secondary | ICD-10-CM | POA: Diagnosis not present

## 2021-07-18 DIAGNOSIS — Z79899 Other long term (current) drug therapy: Secondary | ICD-10-CM | POA: Diagnosis not present

## 2021-07-18 DIAGNOSIS — C20 Malignant neoplasm of rectum: Secondary | ICD-10-CM | POA: Insufficient documentation

## 2021-07-18 DIAGNOSIS — Z9889 Other specified postprocedural states: Secondary | ICD-10-CM

## 2021-07-18 HISTORY — PX: VIDEO BRONCHOSCOPY WITH RADIAL ENDOBRONCHIAL ULTRASOUND: SHX6849

## 2021-07-18 HISTORY — DX: Cardiac murmur, unspecified: R01.1

## 2021-07-18 HISTORY — PX: BRONCHIAL BIOPSY: SHX5109

## 2021-07-18 HISTORY — DX: Post-traumatic stress disorder, unspecified: F43.10

## 2021-07-18 HISTORY — DX: Personal history of urinary calculi: Z87.442

## 2021-07-18 HISTORY — DX: Family history of other specified conditions: Z84.89

## 2021-07-18 HISTORY — PX: BRONCHIAL NEEDLE ASPIRATION BIOPSY: SHX5106

## 2021-07-18 HISTORY — PX: VIDEO BRONCHOSCOPY WITH ENDOBRONCHIAL NAVIGATION: SHX6175

## 2021-07-18 HISTORY — PX: BRONCHIAL BRUSHINGS: SHX5108

## 2021-07-18 LAB — COMPREHENSIVE METABOLIC PANEL
ALT: 24 U/L (ref 0–44)
AST: 25 U/L (ref 15–41)
Albumin: 3.1 g/dL — ABNORMAL LOW (ref 3.5–5.0)
Alkaline Phosphatase: 92 U/L (ref 38–126)
Anion gap: 9 (ref 5–15)
BUN: 12 mg/dL (ref 6–20)
CO2: 25 mmol/L (ref 22–32)
Calcium: 9 mg/dL (ref 8.9–10.3)
Chloride: 107 mmol/L (ref 98–111)
Creatinine, Ser: 0.93 mg/dL (ref 0.61–1.24)
GFR, Estimated: >60 mL/min (ref >60)
Glucose, Bld: 100 mg/dL — ABNORMAL HIGH (ref 70–99)
Potassium: 3.9 mmol/L (ref 3.5–5.1)
Sodium: 141 mmol/L (ref 135–145)
Total Bilirubin: 0.7 mg/dL (ref 0.3–1.2)
Total Protein: 5.9 g/dL — ABNORMAL LOW (ref 6.5–8.1)

## 2021-07-18 LAB — CBC
HCT: 38.6 % — ABNORMAL LOW (ref 39.0–52.0)
Hemoglobin: 12.3 g/dL — ABNORMAL LOW (ref 13.0–17.0)
MCH: 29.6 pg (ref 26.0–34.0)
MCHC: 31.9 g/dL (ref 30.0–36.0)
MCV: 92.8 fL (ref 80.0–100.0)
Platelets: 375 10*3/uL (ref 150–400)
RBC: 4.16 MIL/uL — ABNORMAL LOW (ref 4.22–5.81)
RDW: 13.4 % (ref 11.5–15.5)
WBC: 5.2 10*3/uL (ref 4.0–10.5)
nRBC: 0 % (ref 0.0–0.2)

## 2021-07-18 SURGERY — VIDEO BRONCHOSCOPY WITH ENDOBRONCHIAL NAVIGATION
Anesthesia: General | Laterality: Bilateral

## 2021-07-18 MED ORDER — SODIUM CHLORIDE 0.9 % IV SOLN: INTRAVENOUS | Status: DC | PRN

## 2021-07-18 MED ORDER — MIDAZOLAM HCL 2 MG/2ML IJ SOLN: 1.0000 mg | Freq: Once | INTRAMUSCULAR | Status: DC

## 2021-07-18 MED ORDER — OXYCODONE HCL 5 MG/5ML PO SOLN: 5.0000 mg | Freq: Once | ORAL | Status: DC | PRN

## 2021-07-18 MED ORDER — SUGAMMADEX SODIUM 200 MG/2ML IV SOLN
INTRAVENOUS | Status: DC | PRN
  Administered 2021-07-18: 200 mg via INTRAVENOUS

## 2021-07-18 MED ORDER — PROPOFOL 10 MG/ML IV BOLUS
INTRAVENOUS | Status: DC | PRN
  Administered 2021-07-18: 200 mg via INTRAVENOUS

## 2021-07-18 MED ORDER — ONDANSETRON HCL 4 MG/2ML IJ SOLN
INTRAMUSCULAR | Status: DC | PRN
  Administered 2021-07-18: 4 mg via INTRAVENOUS

## 2021-07-18 MED ORDER — CHLORHEXIDINE GLUCONATE 0.12 % MT SOLN: 15.0000 mL | Freq: Once | OROMUCOSAL | Status: AC

## 2021-07-18 MED ORDER — ACETAMINOPHEN 160 MG/5ML PO SOLN: 325.0000 mg | ORAL | Status: DC | PRN

## 2021-07-18 MED ORDER — SODIUM CHLORIDE 0.9 % IV SOLN: INTRAVENOUS | Status: DC

## 2021-07-18 MED ORDER — CHLORHEXIDINE GLUCONATE 0.12 % MT SOLN
OROMUCOSAL | Status: AC
  Administered 2021-07-18: 15 mL via OROMUCOSAL
  Filled 2021-07-18: qty 15

## 2021-07-18 MED ORDER — KETOROLAC TROMETHAMINE 30 MG/ML IJ SOLN: 30.0000 mg | Freq: Once | INTRAMUSCULAR | Status: DC | PRN

## 2021-07-18 MED ORDER — MEPERIDINE HCL 100 MG/ML IJ SOLN: 6.2500 mg | INTRAMUSCULAR | Status: DC | PRN

## 2021-07-18 MED ORDER — OXYCODONE HCL 5 MG PO TABS: 5.0000 mg | ORAL_TABLET | Freq: Once | ORAL | Status: DC | PRN

## 2021-07-18 MED ORDER — LACTATED RINGERS IV SOLN: INTRAVENOUS | Status: DC | PRN

## 2021-07-18 MED ORDER — MIDAZOLAM HCL 2 MG/2ML IJ SOLN
INTRAMUSCULAR | Status: DC | PRN
  Administered 2021-07-18: 2 mg via INTRAVENOUS

## 2021-07-18 MED ORDER — PROMETHAZINE HCL 25 MG/ML IJ SOLN: 6.2500 mg | INTRAMUSCULAR | Status: DC | PRN

## 2021-07-18 MED ORDER — CEFAZOLIN SODIUM-DEXTROSE 2-4 GM/100ML-% IV SOLN
INTRAVENOUS | Status: AC
  Filled 2021-07-18: qty 100

## 2021-07-18 MED ORDER — ACETAMINOPHEN 325 MG PO TABS: 325.0000 mg | ORAL_TABLET | ORAL | Status: DC | PRN

## 2021-07-18 MED ORDER — PHENYLEPHRINE 40 MCG/ML (10ML) SYRINGE FOR IV PUSH (FOR BLOOD PRESSURE SUPPORT)
PREFILLED_SYRINGE | INTRAVENOUS | Status: DC | PRN
  Administered 2021-07-18: 80 ug via INTRAVENOUS

## 2021-07-18 MED ORDER — FENTANYL CITRATE (PF) 100 MCG/2ML IJ SOLN
INTRAMUSCULAR | Status: DC | PRN
  Administered 2021-07-18 (×2): 50 ug via INTRAVENOUS

## 2021-07-18 MED ORDER — LIDOCAINE 2% (20 MG/ML) 5 ML SYRINGE
INTRAMUSCULAR | Status: DC | PRN
  Administered 2021-07-18: 100 mg via INTRAVENOUS

## 2021-07-18 MED ORDER — DEXAMETHASONE SODIUM PHOSPHATE 10 MG/ML IJ SOLN
INTRAMUSCULAR | Status: DC | PRN
  Administered 2021-07-18: 10 mg via INTRAVENOUS

## 2021-07-18 MED ORDER — ROCURONIUM BROMIDE 10 MG/ML (PF) SYRINGE
PREFILLED_SYRINGE | INTRAVENOUS | Status: DC | PRN
  Administered 2021-07-18: 70 mg via INTRAVENOUS
  Administered 2021-07-18: 10 mg via INTRAVENOUS

## 2021-07-18 MED ORDER — FENTANYL CITRATE (PF) 100 MCG/2ML IJ SOLN: 25.0000 ug | INTRAMUSCULAR | Status: DC | PRN

## 2021-07-18 SURGICAL SUPPLY — 46 items

## 2021-07-18 NOTE — Discharge Instructions (Signed)
Flexible Bronchoscopy, Care After This sheet gives you information about how to care for yourself after your test. Your doctor may also give you more specific instructions. If you have problems or questions, contact your doctor. Follow these instructions at home: Eating and drinking Do not eat or drink anything (not even water) for 2 hours after your test, or until your numbing medicine (local anesthetic) wears off. When your numbness is gone and your cough and gag reflexes have come back, you may: Eat only soft foods. Slowly drink liquids. The day after the test, go back to your normal diet. Driving Do not drive for 24 hours if you were given a medicine to help you relax (sedative). Do not drive or use heavy machinery while taking prescription pain medicine. General instructions  Take over-the-counter and prescription medicines only as told by your doctor. Return to your normal activities as told. Ask what activities are safe for you. Do not use any products that have nicotine or tobacco in them. This includes cigarettes and e-cigarettes. If you need help quitting, ask your doctor. Keep all follow-up visits as told by your doctor. This is important. It is very important if you had a tissue sample (biopsy) taken. Get help right away if: You have shortness of breath that gets worse. You get light-headed. You feel like you are going to pass out (faint). You have chest pain. You cough up: More than a little blood. More blood than before. Summary Do not eat or drink anything (not even water) for 2 hours after your test, or until your numbing medicine wears off. Do not use cigarettes. Do not use e-cigarettes. Get help right away if you have chest pain.  This information is not intended to replace advice given to you by your health care provider. Make sure you discuss any questions you have with your health care provider. Document Released: 08/17/2009 Document Revised: 10/02/2017 Document  Reviewed: 11/07/2016 Elsevier Patient Education  2020 [employer].

## 2021-07-18 NOTE — Interval H&P Note (Signed)
History and Physical Interval Note:  07/18/2021 9:46 AM  Bradley Colon  has presented today for surgery, with the diagnosis of lung nodules.  The various methods of treatment have been discussed with the patient and family. After consideration of risks, benefits and other options for treatment, the patient has consented to  Procedure(s) with comments: [place] (Bilateral) - ION as a surgical intervention.  The patient's history has been reviewed, patient examined, no change in status, stable for surgery.  I have reviewed the patient's chart and labs.  Questions were answered to the patient's satisfaction.     [place]

## 2021-07-18 NOTE — Op Note (Signed)
Video Bronchoscopy with Robotic Assisted Bronchoscopic Navigation   Date of Operation: 07/18/2021   Pre-op Diagnosis: Lung nodules, concern for metastatic rectal cancer   Post-op Diagnosis: Lung nodules, concern for metastatic rectal cancer  Surgeon: Garner Nash, DO  Assistants: None   Anesthesia: General endotracheal anesthesia  Operation: Flexible video fiberoptic bronchoscopy with robotic assistance and biopsies.  Estimated Blood Loss: Minimal  Complications: None  Indications and History: Bradley Colon is a 50 y.o. male with history of rectal cancer, multiple pulmonary nodules concerning for metastatic disease. The risks, benefits, complications, treatment options and expected outcomes were discussed with the patient.  The possibilities of pneumothorax, pneumonia, reaction to medication, pulmonary aspiration, perforation of a viscus, bleeding, failure to diagnose a condition and creating a complication requiring transfusion or operation were discussed with the patient who freely signed the consent.    Description of Procedure: The patient was seen in the Preoperative Area, was examined and was deemed appropriate to proceed.  The patient was taken to [facility] endoscopy room 3, identified as Bradley Colon and the procedure verified as Flexible Video Fiberoptic Bronchoscopy.  A Time Out was held and the above information confirmed.   Prior to the date of the procedure a high-resolution CT scan of the chest was performed. Utilizing ION software program a virtual tracheobronchial tree was generated to allow the creation of distinct navigation pathways to the patient's parenchymal abnormalities. After being taken to the operating room general anesthesia was initiated and the patient  was orally intubated. The video fiberoptic bronchoscope was introduced via the endotracheal tube and a general inspection was performed which showed normal right and left lung anatomy, aspiration of the bilateral  mainstems was completed to remove any remaining secretions. Robotic catheter inserted into patient's endotracheal tube.   Target #1 right lower lobe: The distinct navigation pathways prepared prior to this procedure were then utilized to navigate to patient's lesion identified on CT scan. The robotic catheter was secured into place and the vision probe was withdrawn.  Lesion location was approximated using fluoroscopy and radial endobronchial ultrasound for peripheral targeting. Under fluoroscopic guidance transbronchial needle brushings, transbronchial needle biopsies, and transbronchial forceps biopsies were performed to be sent for cytology and pathology.  At the end of the procedure a general airway inspection was performed and there was no evidence of active bleeding. The bronchoscope was removed.  The patient tolerated the procedure well. There was no significant blood loss and there were no obvious complications. A post-procedural chest x-ray is pending.  Samples Target #1: 1. Transbronchial needle brushings from right lower lobe 2. Transbronchial Wang needle biopsies from right lower lobe 3. Transbronchial forceps biopsies from right lower lobe  Plans:  The patient will be discharged from the PACU to home when recovered from anesthesia and after chest x-ray is reviewed. We will review the cytology, pathology and microbiology results with the patient when they become available. Outpatient followup will be with Dr. Agnes Lawrence, DO [facility] 07/18/2021 11:23 AM

## 2021-07-18 NOTE — Anesthesia Procedure Notes (Signed)
Procedure Name: Intubation Date/Time: 07/18/2021 10:18 AM Performed by: Harden Mo, CRNA Pre-anesthesia Checklist: Patient identified, Emergency Drugs available, Suction available and Patient being monitored Patient Re-evaluated:Patient Re-evaluated prior to induction Oxygen Delivery Method: Circle System Utilized Preoxygenation: Pre-oxygenation with 100% oxygen Induction Type: IV induction Ventilation: Mask ventilation without difficulty and Oral airway inserted - appropriate to patient size Laryngoscope Size: Sabra Heck and 2 Grade View: Grade I Tube type: Oral Tube size: 8.5 mm Number of attempts: 1 Airway Equipment and Method: Stylet and Oral airway Placement Confirmation: ETT inserted through vocal cords under direct vision, positive ETCO2 and breath sounds checked- equal and bilateral Secured at: 22 cm Tube secured with: Tape Dental Injury: Teeth and Oropharynx as per pre-operative assessment

## 2021-07-18 NOTE — Anesthesia Preprocedure Evaluation (Addendum)
Anesthesia Evaluation  Patient identified by MRN, date of birth, ID band Patient awake    Reviewed: Allergy & Precautions, NPO status , Patient's Chart, lab work & pertinent test results  History of Anesthesia Complications (+) Family history of anesthesia reaction  Airway Mallampati: I       Dental  (+) Poor Dentition,    Pulmonary asthma , former smoker,    Pulmonary exam normal        Cardiovascular negative cardio ROS Normal cardiovascular exam     Neuro/Psych PSYCHIATRIC DISORDERS Anxiety    GI/Hepatic negative GI ROS, Neg liver ROS,   Endo/Other  negative endocrine ROS  Renal/GU negative Renal ROS Bladder dysfunction      Musculoskeletal  (+) Fibromyalgia -, narcotic dependent  Abdominal Normal abdominal exam  (+)   Peds  Hematology  (+) anemia ,   Anesthesia Other Findings   Reproductive/Obstetrics                             Anesthesia Physical Anesthesia Plan  ASA: 2  Anesthesia Plan: General   Post-op Pain Management:    Induction: Intravenous  PONV Risk Score and Plan: 2 and Ondansetron and Midazolam  Airway Management Planned: Oral ETT  Additional Equipment: None  Intra-op Plan:   Post-operative Plan: Extubation in OR  Informed Consent: I have reviewed the patients History and Physical, chart, labs and discussed the procedure including the risks, benefits and alternatives for the proposed anesthesia with the patient or authorized representative who has indicated his/her understanding and acceptance.     Dental advisory given  Plan Discussed with: CRNA  Anesthesia Plan Comments:         Anesthesia Quick Evaluation

## 2021-07-18 NOTE — Transfer of Care (Signed)
Immediate Anesthesia Transfer of Care Note  Patient: Bradley Colon  Procedure(s) Performed: VIDEO BRONCHOSCOPY WITH ENDOBRONCHIAL NAVIGATION (Bilateral) BRONCHIAL BIOPSIES BRONCHIAL BRUSHINGS BRONCHIAL NEEDLE ASPIRATION BIOPSIES RADIAL ENDOBRONCHIAL ULTRASOUND  Patient Location: PACU  Anesthesia Type:General  Level of Consciousness: awake and alert   Airway & Oxygen Therapy: Patient Spontanous Breathing  Post-op Assessment: Report given to RN, Post -op Vital signs reviewed and stable and Patient moving all extremities X 4  Post vital signs: Reviewed and stable  Last Vitals:  Vitals Value Taken Time  BP 148/82 07/18/21 1125  Temp    Pulse 69 07/18/21 1128  Resp 17 07/18/21 1128  SpO2 100 % 07/18/21 1128  Vitals shown include unvalidated device data.  Last Pain:  Vitals:   07/18/21 0750  TempSrc:   PainSc: 0-No pain         Complications: No notable events documented.

## 2021-07-19 ENCOUNTER — Ambulatory Visit (HOSPITAL_COMMUNITY): Payer: Medicaid Other

## 2021-07-20 ENCOUNTER — Encounter (HOSPITAL_COMMUNITY): Payer: Self-pay | Admitting: Pulmonary Disease

## 2021-07-22 ENCOUNTER — Other Ambulatory Visit: Payer: Self-pay

## 2021-07-22 ENCOUNTER — Inpatient Hospital Stay (HOSPITAL_BASED_OUTPATIENT_CLINIC_OR_DEPARTMENT_OTHER): Payer: Medicaid Other | Admitting: Oncology

## 2021-07-22 VITALS — BP 156/90 | HR 94 | Temp 98.1°F | Resp 18 | Ht 67.5 in | Wt 195.2 lb

## 2021-07-22 DIAGNOSIS — C20 Malignant neoplasm of rectum: Secondary | ICD-10-CM | POA: Diagnosis not present

## 2021-07-22 LAB — CYTOLOGY - NON PAP

## 2021-07-22 NOTE — Progress Notes (Signed)
[facility] OFFICE PROGRESS NOTE   Diagnosis: Rectal cancer  INTERVAL HISTORY:   Bradley Colon returns as scheduled.  He underwent a bronchoscopy 07/18/2021.  A right lower lobe lesion was targeted for biopsy.  Transbronchial needle brushings, transbronchial needle biopsies, and transbronchial forcep biopsies were performed.  The cytology from the needle aspiration biopsy revealed no malignant cells.  A brushing of the left lower lobe revealed malignant cells consistent with adenocarcinoma. He continues to have pain at the rectum.  He is currently constipated.  He reports pruritus at the right buttock.  He has developed abrasions from scratching.  He has numbness at the right posterior thigh. Objective:  Vital signs in last 24 hours:  Blood pressure (!) 156/90, pulse 94, temperature 98.1 F (36.7 C), temperature source Oral, resp. rate 18, height 5' 7.5" (1.715 m), weight 195 lb 3.2 oz (88.5 kg), SpO2 100 %.    Resp: Lungs clear bilaterally Cardio: Regular rate and rhythm GI: No hepatosplenomegaly, no mass, reducible ventral hernia Vascular: No leg edema  Skin: Superficial abrasions at the right buttock, no mass  Portacath/PICC-without erythema  Lab Results:  Lab Results  Component Value Date   WBC 5.2 07/18/2021   HGB 12.3 (L) 07/18/2021   HCT 38.6 (L) 07/18/2021   MCV 92.8 07/18/2021   PLT 375 07/18/2021   NEUTROABS 5.3 05/21/2021    CMP  Lab Results  Component Value Date   NA 141 07/18/2021   K 3.9 07/18/2021   CL 107 07/18/2021   CO2 25 07/18/2021   GLUCOSE 100 (H) 07/18/2021   BUN 12 07/18/2021   CREATININE 0.93 07/18/2021   CALCIUM 9.0 07/18/2021   PROT 5.9 (L) 07/18/2021   ALBUMIN 3.1 (L) 07/18/2021   AST 25 07/18/2021   ALT 24 07/18/2021   ALKPHOS 92 07/18/2021   BILITOT 0.7 07/18/2021   GFRNONAA >60 07/18/2021   GFRAA >60 01/02/2020    Lab Results  Component Value Date   CEA1 41.21 (H) 05/21/2021   CEA 28.20 (H) 05/21/2021      Medications: I have reviewed the patient's current medications.   Assessment/Plan: Rectal cancer, clinical stage III (T3cN2), Foundation 1 on lymph node 11/18/2019-tumor mutation burden 1, FG R1 amplification, K-ras wild-type, microsatellite status could not be determined Rectal mass extending from 4-9 cm on colonoscopy 02/15/2019 with a biopsy confirming invasive adenocarcinoma CTs 02/23/2019- thickening of the low rectum without a discrete mass, small bilateral pulmonary nodules, some present on imaging from 2014 Pelvic MRI 02/25/2019- T3cN2 tumor beginning at 4.9 cm from the anal verge and 0 cm from the internal anal sphincter, tumor extends through the muscularis propria, 2.  Rectal lymph nodes and 2 enlarged right internal iliac nodes CT[facility] at [facility] 04/19/2019- new subcentimeter right hepatic lesion concerning for metastasis, stable low rectal mass with prominent mesorectal lymph nodes, stable pathologic enlarged right internal iliac nodes, stable pulmonary nodules Xeloda/radiation beginning 04/26/2019, completed 07/05/2019 CT 07/26/2019-growth of previously described right hepatic lesion, slight decrease in pelvic lymphadenopathy and rectal thickening 08/05/2019-week 1 bolus 5-FU/leucovorin (200 mg/m) 08/12/2019-week to bolus 5-FU/leucovorin (400 mg/m) 10/16/ 2020-week 3 bolus 5-FU/leucovorin (400 mg/m) and oxaliplatin (42.5 mg/m) CT chest 10/04/2019-slight increase in bilateral pulmonary nodules concerning for metastases MRI abdomen 10/06/2019-right hepatic lesion consistent with metastasis, wall thickening of the low anterior rectum, no new or enlarging adenopathy, stable mesorectal and right internal iliac nodes PET scan 10/14/2019-pulmonary nodules below PET resolution, hypermetabolic right posterior hepatic lesion, mildly hypermetabolic partially calcified right internal iliac chain lymphadenopathy,  mild gastric hypermetabolism and thickening-likely gastritis 11/18/2019-LAR with diverting loop  ileostomy and ablation of solitary liver metastasis (2 cm segment 7), moderately differentiated adenocarcinoma invading into soft tissue, LVI present, perineural invasion present, 5/15 lymph nodes positive, 4 tumor deposits, absent treatment effect-score 3,ypT3 ypN2a, mismatch repair protein expression intact, MSS CT chest 01/16/2020-increase in the size of a dominant right lower lobe nodule, unchanged left upper lobe and left lower lobe nodules CT chest 04/11/2020-enlargement of bilateral lung nodules, and new nodules CT chest 09/11/2020-enlargement of bilateral lung nodules, aggressive medial right liver lesion Cycle 1 FOLFOX 10/31/2020 Cycle 2 FOLFOX 11/14/2020 Cycle 3 FOLFOX 11/29/2020 CT chest 01/03/2021-multiple bilateral lung nodules, stable to slightly decreased in size, no new lesions, hepatic dome lesion is smaller (ablated lesion?) Cycle 4 FOLFOX 01/21/2021 CTs 07/02/2021-increase in size and number of pulmonary metastases, new mediastinal lymph node, increased right hepatic lobe metastasis Targeted bronchoscopy of right lower lobe lesion 07/18/2021-brushing revealed malignant cells consistent with adenocarcinoma Pain secondary to #1, improved Reactive airways disease/asthma Report of multiple allergens Port-A-Cath related DVT, right IJ, 09/30/2019-apixaban, converted to Lovenox surrounding surgery January 2021, changed back to apixaban 12/30/2019 Erectile dysfunction following treatment for rectal cancer-followed by urology Rectal/perineal pain following the low anterior resection-persistent     Disposition: Bradley Colon appears stable.  I reviewed the cytology result with him.  A brushing biopsy from the right lower lobe target lesion confirmed adenocarcinoma.  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  We discussed treatment options again today.  I recommend treatment with FOLFIRI/panitumumab.  He agrees to proceed.  We again  discussed potential toxicities associated with the FOLFIRI/panitumumab regimen.  This included a discussion of the alopecia, nausea/vomiting, and diarrhea associated with FOLFIRI.  We discussed the allergic reaction, rash, and other skin toxicities associated with panitumumab.  The plan is to begin FOLFIRI/panitumumab on 07/29/2021.  He will return for an office visit and cycle 2 on 08/13/2021.  We will monitor the CEA and chest CT as markers of response.   A chemotherapy plan was entered today.    Betsy Coder, MD  07/22/2021  3:41 PM

## 2021-07-22 NOTE — Progress Notes (Signed)
DISCONTINUE ON PATHWAY REGIMEN - Colorectal     A cycle is every 14 days:     Oxaliplatin      Leucovorin      Fluorouracil      Fluorouracil   **Always confirm dose/schedule in your pharmacy ordering system**  REASON: Disease Progression PRIOR TREATMENT: [id number]: mFOLFOX6 q14 Days TREATMENT RESPONSE: Unable to Evaluate  START ON PATHWAY REGIMEN - Colorectal     A cycle is every 14 days:     Panitumumab      Irinotecan      Leucovorin      Fluorouracil      Fluorouracil   **Always confirm dose/schedule in your pharmacy ordering system**  Patient Characteristics: Distant Metastases, Nonsurgical Candidate, KRAS/NRAS Wild-Type (BRAF V600 Wild-Type/Unknown), Standard Cytotoxic Therapy, Second Line Standard Cytotoxic Therapy, Bevacizumab Ineligible Tumor Location: Rectal Therapeutic Status: Distant Metastases Microsatellite/Mismatch Repair Status: MSS/pMMR BRAF Mutation Status: Wild-Type (no mutation) KRAS/NRAS Mutation Status: Wild-Type (no mutation) Preferred Therapy Approach: Standard Cytotoxic Therapy Standard Cytotoxic Line of Therapy: Second Line Standard Cytotoxic Therapy Bevacizumab Eligibility: Ineligible Intent of Therapy: Non-Curative / Palliative Intent, Discussed with Patient

## 2021-07-25 NOTE — Anesthesia Postprocedure Evaluation (Signed)
Anesthesia Post Note  Patient: Bradley Colon  Procedure(s) Performed: VIDEO BRONCHOSCOPY WITH ENDOBRONCHIAL NAVIGATION (Bilateral) BRONCHIAL BIOPSIES BRONCHIAL BRUSHINGS BRONCHIAL NEEDLE ASPIRATION BIOPSIES RADIAL ENDOBRONCHIAL ULTRASOUND     Patient location during evaluation: PACU Anesthesia Type: General Level of consciousness: awake Pain management: pain level controlled Vital Signs Assessment: post-procedure vital signs reviewed and stable Respiratory status: spontaneous breathing Cardiovascular status: stable Postop Assessment: no apparent nausea or vomiting Anesthetic complications: no   No notable events documented.  Last Vitals:  Vitals:   07/18/21 1141 07/18/21 1156  BP: (!) 155/68 (!) 148/82  Pulse: (!) 57 60  Resp: 15 16  Temp:  36.8 C  SpO2: 97% 99%    Last Pain:  Vitals:   07/18/21 1156  TempSrc:   PainSc: 0-No pain                 Huston Foley

## 2021-07-29 ENCOUNTER — Encounter: Payer: Self-pay | Admitting: Oncology

## 2021-07-29 ENCOUNTER — Inpatient Hospital Stay: Payer: Medicaid Other

## 2021-07-30 ENCOUNTER — Encounter: Payer: Self-pay | Admitting: Oncology

## 2021-07-31 ENCOUNTER — Inpatient Hospital Stay: Payer: Medicaid Other

## 2021-08-01 NOTE — Telephone Encounter (Signed)
Bronch performed on 07/18/2021. Will close encounter.

## 2021-08-05 ENCOUNTER — Encounter: Payer: Self-pay | Admitting: Oncology

## 2021-08-05 ENCOUNTER — Inpatient Hospital Stay: Payer: Medicaid Other

## 2021-08-07 ENCOUNTER — Inpatient Hospital Stay: Payer: Medicaid Other

## 2021-08-08 ENCOUNTER — Other Ambulatory Visit: Payer: Self-pay | Admitting: Internal Medicine

## 2021-08-09 ENCOUNTER — Other Ambulatory Visit (HOSPITAL_COMMUNITY): Payer: Self-pay

## 2021-08-10 ENCOUNTER — Other Ambulatory Visit (HOSPITAL_COMMUNITY): Payer: Self-pay

## 2021-08-11 ENCOUNTER — Encounter: Payer: Self-pay | Admitting: Oncology

## 2021-08-12 ENCOUNTER — Other Ambulatory Visit: Payer: Self-pay | Admitting: Internal Medicine

## 2021-08-12 ENCOUNTER — Other Ambulatory Visit: Payer: Self-pay

## 2021-08-12 ENCOUNTER — Other Ambulatory Visit (HOSPITAL_COMMUNITY): Payer: Self-pay

## 2021-08-12 ENCOUNTER — Inpatient Hospital Stay: Payer: Medicaid Other | Attending: Oncology

## 2021-08-12 ENCOUNTER — Ambulatory Visit: Payer: Self-pay | Admitting: Nurse Practitioner

## 2021-08-12 ENCOUNTER — Inpatient Hospital Stay: Payer: Medicaid Other

## 2021-08-12 VITALS — BP 137/95 | HR 70 | Temp 98.1°F | Resp 18

## 2021-08-12 DIAGNOSIS — C20 Malignant neoplasm of rectum: Secondary | ICD-10-CM

## 2021-08-12 DIAGNOSIS — Z452 Encounter for adjustment and management of vascular access device: Secondary | ICD-10-CM | POA: Diagnosis not present

## 2021-08-12 DIAGNOSIS — Z5112 Encounter for antineoplastic immunotherapy: Secondary | ICD-10-CM | POA: Diagnosis not present

## 2021-08-12 DIAGNOSIS — Z5111 Encounter for antineoplastic chemotherapy: Secondary | ICD-10-CM | POA: Insufficient documentation

## 2021-08-12 LAB — CEA (IN HOUSE-CHCC): CEA (CHCC-In House): 59.33 ng/mL — ABNORMAL HIGH (ref 0.00–5.00)

## 2021-08-12 LAB — CEA (ACCESS): CEA (CHCC): 39.25 ng/mL — ABNORMAL HIGH (ref 0.00–5.00)

## 2021-08-12 MED ORDER — SODIUM CHLORIDE 0.9 % IV SOLN
1600.0000 mg/m2 | INTRAVENOUS | Status: DC
  Administered 2021-08-12: 3300 mg via INTRAVENOUS
  Filled 2021-08-12: qty 66

## 2021-08-12 MED ORDER — OXYCODONE HCL 20 MG PO TABS
20.0000 mg | ORAL_TABLET | Freq: Three times a day (TID) | ORAL | 0 refills | Status: DC | PRN
  Filled 2021-08-12: qty 90, 30d supply, fill #0

## 2021-08-12 MED ORDER — SODIUM CHLORIDE 0.9 % IV SOLN
6.0000 mg/kg | Freq: Once | INTRAVENOUS | Status: AC
  Administered 2021-08-12: 500 mg via INTRAVENOUS
  Filled 2021-08-12: qty 20

## 2021-08-12 MED ORDER — SODIUM CHLORIDE 0.9 % IV SOLN
140.0000 mg/m2 | Freq: Once | INTRAVENOUS | Status: AC
  Administered 2021-08-12: 280 mg via INTRAVENOUS
  Filled 2021-08-12: qty 14

## 2021-08-12 MED ORDER — SODIUM CHLORIDE 0.9 % IV SOLN
10.0000 mg | Freq: Once | INTRAVENOUS | Status: AC
  Administered 2021-08-12: 10 mg via INTRAVENOUS
  Filled 2021-08-12: qty 1

## 2021-08-12 MED ORDER — SODIUM CHLORIDE 0.9 % IV SOLN
300.0000 mg/m2 | Freq: Once | INTRAVENOUS | Status: AC
  Administered 2021-08-12: 616 mg via INTRAVENOUS
  Filled 2021-08-12 (×2): qty 30.8

## 2021-08-12 MED ORDER — ATROPINE SULFATE 1 MG/ML IJ SOLN
0.5000 mg | Freq: Once | INTRAMUSCULAR | Status: AC | PRN
  Administered 2021-08-12: 0.5 mg via INTRAVENOUS
  Filled 2021-08-12: qty 1

## 2021-08-12 MED ORDER — SODIUM CHLORIDE 0.9 % IV SOLN: 300.0000 mg/m2 | Freq: Once | INTRAVENOUS | Status: DC

## 2021-08-12 MED ORDER — SODIUM CHLORIDE 0.9 % IV SOLN: Freq: Once | INTRAVENOUS | Status: AC

## 2021-08-12 MED ORDER — FLUOROURACIL CHEMO INJECTION 2.5 GM/50ML
300.0000 mg/m2 | Freq: Once | INTRAVENOUS | Status: AC
  Administered 2021-08-12: 600 mg via INTRAVENOUS
  Filled 2021-08-12: qty 12

## 2021-08-12 MED ORDER — DEXAMETHASONE 2 MG PO TABS
2.0000 mg | ORAL_TABLET | Freq: Every day | ORAL | 1 refills | Status: DC
  Filled 2021-08-12: qty 30, 30d supply, fill #0

## 2021-08-12 MED ORDER — PALONOSETRON HCL INJECTION 0.25 MG/5ML
0.2500 mg | Freq: Once | INTRAVENOUS | Status: AC
  Administered 2021-08-12: 0.25 mg via INTRAVENOUS
  Filled 2021-08-12: qty 5

## 2021-08-12 MED ORDER — MORPHINE SULFATE ER 60 MG PO TBCR
60.0000 mg | EXTENDED_RELEASE_TABLET | Freq: Two times a day (BID) | ORAL | 0 refills | Status: DC
  Filled 2021-08-12: qty 60, 30d supply, fill #0

## 2021-08-12 NOTE — Patient Instructions (Signed)
[facility]  Discharge Instructions: Thank you for choosing [facility] to provide your oncology and hematology care.   If you have a lab appointment with the [facility], please go directly to the [facility] and check in at the registration area.   Wear comfortable clothing and clothing appropriate for easy access to any Portacath or PICC line.   We strive to give you quality time with your provider. You may need to reschedule your appointment if you arrive late (15 or more minutes).  Arriving late affects you and other patients whose appointments are after yours.  Also, if you miss three or more appointments without notifying the office, you may be dismissed from the clinic at the provider's discretion.      For prescription refill requests, have your pharmacy contact our office and allow 72 hours for refills to be completed.    Today you received the following chemotherapy and/or immunotherapy agents VECTIBEX, IRINOTECAN, LEUCOVORIN, 5 FU      To help prevent nausea and vomiting after your treatment, we encourage you to take your nausea medication as directed.  BELOW ARE SYMPTOMS THAT SHOULD BE REPORTED IMMEDIATELY: *FEVER GREATER THAN 100.4 F (38 C) OR HIGHER *CHILLS OR SWEATING *NAUSEA AND VOMITING THAT IS NOT CONTROLLED WITH YOUR NAUSEA MEDICATION *UNUSUAL SHORTNESS OF BREATH *UNUSUAL BRUISING OR BLEEDING *URINARY PROBLEMS (pain or burning when urinating, or frequent urination) *BOWEL PROBLEMS (unusual diarrhea, constipation, pain near the anus) TENDERNESS IN MOUTH AND THROAT WITH OR WITHOUT PRESENCE OF ULCERS (sore throat, sores in mouth, or a toothache) UNUSUAL RASH, SWELLING OR PAIN  UNUSUAL VAGINAL DISCHARGE OR ITCHING   Items with * indicate a potential emergency and should be followed up as soon as possible or go to the Emergency Department if any problems should occur.  Please show the CHEMOTHERAPY ALERT CARD or IMMUNOTHERAPY  ALERT CARD at check-in to the Emergency Department and triage nurse.  Should you have questions after your visit or need to cancel or reschedule your appointment, please contact [facility]  Dept: [phone]  and follow the prompts.  Office hours are 8:00 a.m. to 4:30 p.m. Monday - Friday. Please note that voicemails left after 4:00 p.m. may not be returned until the following business day.  We are closed weekends and major holidays. You have access to a nurse at all times for urgent questions. Please call the main number to the clinic Dept: [phone] and follow the prompts.   For any non-urgent questions, you may also contact your provider using MyChart. We now offer e-Visits for anyone 9 and older to request care online for non-urgent symptoms. For details visit [email or web address].   Also download the MyChart app! Go to the app store, search "MyChart", open the app, select , and log in with your MyChart username and password.  Due to Covid, a mask is required upon entering the hospital/clinic. If you do not have a mask, one will be given to you upon arrival. For doctor visits, patients may have 1 support person aged 37 or older with them. For treatment visits, patients cannot have anyone with them due to current Covid guidelines and our immunocompromised population.   Panitumumab Solution for Injection What is this medication? PANITUMUMAB (pan i TOOM ue mab) is a monoclonal antibody. It is used to treat colorectal cancer. This medicine may be used for other purposes; ask your health care provider or pharmacist if you have questions. COMMON  BRAND NAME(S): Vectibix What should I tell my care team before I take this medication? They need to know if you have any of these conditions: eye disease, vision problems low levels of calcium, magnesium, or potassium in the blood lung or breathing disease, like asthma skin conditions or sensitivity an  unusual or allergic reaction to panitumumab, other medicines, foods, dyes, or preservatives pregnant or trying to get pregnant breast-feeding How should I use this medication? This drug is given as an infusion into a vein. It is administered in a hospital or clinic by a specially trained health care professional. Talk to your pediatrician regarding the use of this medicine in children. Special care may be needed. Overdosage: If you think you have taken too much of this medicine contact a poison control center or emergency room at once. NOTE: This medicine is only for you. Do not share this medicine with others. What if I miss a dose? It is important not to miss your dose. Call your doctor or health care professional if you are unable to keep an appointment. What may interact with this medication? Do not take this medicine with any of the following medications: bevacizumab This list may not describe all possible interactions. Give your health care provider a list of all the medicines, herbs, non-prescription drugs, or dietary supplements you use. Also tell them if you smoke, drink alcohol, or use illegal drugs. Some items may interact with your medicine. What should I watch for while using this medication? Visit your doctor for checks on your progress. This drug may make you feel generally unwell. This is not uncommon, as chemotherapy can affect healthy cells as well as cancer cells. Report any side effects. Continue your course of treatment even though you feel ill unless your doctor tells you to stop. This medicine can make you more sensitive to the sun. Keep out of the sun while receiving this medicine and for 2 months after the last dose. If you cannot avoid being in the sun, wear protective clothing and use sunscreen. Do not use sun lamps or tanning beds/booths. In some cases, you may be given additional medicines to help with side effects. Follow all directions for their use. Call your doctor  or health care professional for advice if you get a fever, chills or sore throat, or other symptoms of a cold or flu. Do not treat yourself. This drug decreases your body's ability to fight infections. Try to avoid being around people who are sick. Avoid taking products that contain aspirin, acetaminophen, ibuprofen, naproxen, or ketoprofen unless instructed by your doctor. These medicines may hide a fever. Do not become pregnant while taking this medicine and for 2 months after the last dose. Women should inform their doctor if they wish to become pregnant or think they might be pregnant. There is a potential for serious side effects to an unborn child. Talk to your health care professional or pharmacist for more information. Do not breast-feed an infant while taking this medicine or for 2 months after the last dose. What side effects may I notice from receiving this medication? Side effects that you should report to your doctor or health care professional as soon as possible: allergic reactions like skin rash, itching or hives, swelling of the face, lips, or tongue breathing problems changes in vision eye pain fast, irregular heartbeat fever, chills mouth sores red spots on the skin redness, blistering, peeling or loosening of the skin, including inside the mouth signs and symptoms of kidney  injury like trouble passing urine or change in the amount of urine signs and symptoms of low blood pressure like dizziness; feeling faint or lightheaded, falls; unusually weak or tired signs of low calcium like fast heartbeat, muscle cramps or muscle pain; pain, tingling, numbness in the hands or feet; seizures signs and symptoms of low magnesium like muscle cramps, pain, or weakness; tremors; seizures; or fast, irregular heartbeat signs and symptoms of low potassium like muscle cramps or muscle pain; chest pain; dizziness; feeling faint or lightheaded, falls; palpitations; breathing problems; or fast,  irregular heartbeat swelling of the ankles, feet, hands Side effects that usually do not require medical attention (report to your doctor or health care professional if they continue or are bothersome): changes in skin like acne, cracks, skin dryness diarrhea eyelash growth headache mouth sores nail changes nausea, vomiting This list may not describe all possible side effects. Call your doctor for medical advice about side effects. You may report side effects to FDA at 1-800-FDA-1088. Where should I keep my medication? This drug is given in a hospital or clinic and will not be stored at home. NOTE: This sheet is a summary. It may not cover all possible information. If you have questions about this medicine, talk to your doctor, pharmacist, or health care provider.  2022 Elsevier/Gold Standard (2016-05-09 16:45:04)   Irinotecan injection What is this medication? IRINOTECAN (ir in oh TEE kan ) is a chemotherapy drug. It is used to treat colon and rectal cancer. This medicine may be used for other purposes; ask your health care provider or pharmacist if you have questions. COMMON BRAND NAME(S): Camptosar What should I tell my care team before I take this medication? They need to know if you have any of these conditions: dehydration diarrhea infection (especially a virus infection such as chickenpox, cold sores, or herpes) liver disease low blood counts, like low white cell, platelet, or red cell counts low levels of calcium, magnesium, or potassium in the blood recent or ongoing radiation therapy an unusual or allergic reaction to irinotecan, other medicines, foods, dyes, or preservatives pregnant or trying to get pregnant breast-feeding How should I use this medication? This drug is given as an infusion into a vein. It is administered in a hospital or clinic by a specially trained health care professional. Talk to your pediatrician regarding the use of this medicine in children.  Special care may be needed. Overdosage: If you think you have taken too much of this medicine contact a poison control center or emergency room at once. NOTE: This medicine is only for you. Do not share this medicine with others. What if I miss a dose? It is important not to miss your dose. Call your doctor or health care professional if you are unable to keep an appointment. What may interact with this medication? Do not take this medicine with any of the following medications: cobicistat itraconazole This medicine may interact with the following medications: antiviral medicines for HIV or AIDS certain antibiotics like rifampin or rifabutin certain medicines for fungal infections like ketoconazole, posaconazole, and voriconazole certain medicines for seizures like carbamazepine, phenobarbital, phenotoin clarithromycin gemfibrozil nefazodone St. John's Wort This list may not describe all possible interactions. Give your health care provider a list of all the medicines, herbs, non-prescription drugs, or dietary supplements you use. Also tell them if you smoke, drink alcohol, or use illegal drugs. Some items may interact with your medicine. What should I watch for while using this medication? Your condition will  be monitored carefully while you are receiving this medicine. You will need important blood work done while you are taking this medicine. This drug may make you feel generally unwell. This is not uncommon, as chemotherapy can affect healthy cells as well as cancer cells. Report any side effects. Continue your course of treatment even though you feel ill unless your doctor tells you to stop. In some cases, you may be given additional medicines to help with side effects. Follow all directions for their use. You may get drowsy or dizzy. Do not drive, use machinery, or do anything that needs mental alertness until you know how this medicine affects you. Do not stand or sit up quickly,  especially if you are an older patient. This reduces the risk of dizzy or fainting spells. Call your health care professional for advice if you get a fever, chills, or sore throat, or other symptoms of a cold or flu. Do not treat yourself. This medicine decreases your body's ability to fight infections. Try to avoid being around people who are sick. Avoid taking products that contain aspirin, acetaminophen, ibuprofen, naproxen, or ketoprofen unless instructed by your doctor. These medicines may hide a fever. This medicine may increase your risk to bruise or bleed. Call your doctor or health care professional if you notice any unusual bleeding. Be careful brushing and flossing your teeth or using a toothpick because you may get an infection or bleed more easily. If you have any dental work done, tell your dentist you are receiving this medicine. Do not become pregnant while taking this medicine or for 6 months after stopping it. Women should inform their health care professional if they wish to become pregnant or think they might be pregnant. Men should not father a child while taking this medicine and for 3 months after stopping it. There is potential for serious side effects to an unborn child. Talk to your health care professional for more information. Do not breast-feed an infant while taking this medicine or for 7 days after stopping it. This medicine has caused ovarian failure in some women. This medicine may make it more difficult to get pregnant. Talk to your health care professional if you are concerned about your fertility. This medicine has caused decreased sperm counts in some men. This may make it more difficult to father a child. Talk to your health care professional if you are concerned about your fertility. What side effects may I notice from receiving this medication? Side effects that you should report to your doctor or health care professional as soon as possible: allergic reactions like  skin rash, itching or hives, swelling of the face, lips, or tongue chest pain diarrhea flushing, runny nose, sweating during infusion low blood counts - this medicine may decrease the number of white blood cells, red blood cells and platelets. You may be at increased risk for infections and bleeding. nausea, vomiting pain, swelling, warmth in the leg signs of decreased platelets or bleeding - bruising, pinpoint red spots on the skin, black, tarry stools, blood in the urine signs of infection - fever or chills, cough, sore throat, pain or difficulty passing urine signs of decreased red blood cells - unusually weak or tired, fainting spells, lightheadedness Side effects that usually do not require medical attention (report to your doctor or health care professional if they continue or are bothersome): constipation hair loss headache loss of appetite mouth sores stomach pain This list may not describe all possible side effects. Call your doctor  for medical advice about side effects. You may report side effects to FDA at 1-800-FDA-1088. Where should I keep my medication? This drug is given in a hospital or clinic and will not be stored at home. NOTE: This sheet is a summary. It may not cover all possible information. If you have questions about this medicine, talk to your doctor, pharmacist, or health care provider.  2022 Elsevier/Gold Standard (2019-09-20 17:46:13)   Leucovorin injection What is this medication? LEUCOVORIN (loo koe VOR in) is used to prevent or treat the harmful effects of some medicines. This medicine is used to treat anemia caused by a low amount of folic acid in the body. It is also used with 5-fluorouracil (5-FU) to treat colon cancer. This medicine may be used for other purposes; ask your health care provider or pharmacist if you have questions. What should I tell my care team before I take this medication? They need to know if you have any of these  conditions: anemia from low levels of vitamin B-12 in the blood an unusual or allergic reaction to leucovorin, folic acid, other medicines, foods, dyes, or preservatives pregnant or trying to get pregnant breast-feeding How should I use this medication? This medicine is for injection into a muscle or into a vein. It is given by a health care professional in a hospital or clinic setting. Talk to your pediatrician regarding the use of this medicine in children. Special care may be needed. Overdosage: If you think you have taken too much of this medicine contact a poison control center or emergency room at once. NOTE: This medicine is only for you. Do not share this medicine with others. What if I miss a dose? This does not apply. What may interact with this medication? capecitabine fluorouracil phenobarbital phenytoin primidone trimethoprim-sulfamethoxazole This list may not describe all possible interactions. Give your health care provider a list of all the medicines, herbs, non-prescription drugs, or dietary supplements you use. Also tell them if you smoke, drink alcohol, or use illegal drugs. Some items may interact with your medicine. What should I watch for while using this medication? Your condition will be monitored carefully while you are receiving this medicine. This medicine may increase the side effects of 5-fluorouracil, 5-FU. Tell your doctor or health care professional if you have diarrhea or mouth sores that do not get better or that get worse. What side effects may I notice from receiving this medication? Side effects that you should report to your doctor or health care professional as soon as possible: allergic reactions like skin rash, itching or hives, swelling of the face, lips, or tongue breathing problems fever, infection mouth sores unusual bleeding or bruising unusually weak or tired Side effects that usually do not require medical attention (report to your doctor  or health care professional if they continue or are bothersome): constipation or diarrhea loss of appetite nausea, vomiting This list may not describe all possible side effects. Call your doctor for medical advice about side effects. You may report side effects to FDA at 1-800-FDA-1088. Where should I keep my medication? This drug is given in a hospital or clinic and will not be stored at home. NOTE: This sheet is a summary. It may not cover all possible information. If you have questions about this medicine, talk to your doctor, pharmacist, or health care provider.  2022 Elsevier/Gold Standard (2008-04-25 16:50:29)  Fluorouracil, 5-FU injection What is this medication? FLUOROURACIL, 5-FU (flure oh YOOR a sil) is a chemotherapy drug. It  slows the growth of cancer cells. This medicine is used to treat many types of cancer like breast cancer, colon or rectal cancer, pancreatic cancer, and stomach cancer. This medicine may be used for other purposes; ask your health care provider or pharmacist if you have questions. COMMON BRAND NAME(S): Adrucil What should I tell my care team before I take this medication? They need to know if you have any of these conditions: blood disorders dihydropyrimidine dehydrogenase (DPD) deficiency infection (especially a virus infection such as chickenpox, cold sores, or herpes) kidney disease liver disease malnourished, poor nutrition recent or ongoing radiation therapy an unusual or allergic reaction to fluorouracil, other chemotherapy, other medicines, foods, dyes, or preservatives pregnant or trying to get pregnant breast-feeding How should I use this medication? This drug is given as an infusion or injection into a vein. It is administered in a hospital or clinic by a specially trained health care professional. Talk to your pediatrician regarding the use of this medicine in children. Special care may be needed. Overdosage: If you think you have taken too  much of this medicine contact a poison control center or emergency room at once. NOTE: This medicine is only for you. Do not share this medicine with others. What if I miss a dose? It is important not to miss your dose. Call your doctor or health care professional if you are unable to keep an appointment. What may interact with this medication? Do not take this medicine with any of the following medications: live virus vaccines This medicine may also interact with the following medications: medicines that treat or prevent blood clots like warfarin, enoxaparin, and dalteparin This list may not describe all possible interactions. Give your health care provider a list of all the medicines, herbs, non-prescription drugs, or dietary supplements you use. Also tell them if you smoke, drink alcohol, or use illegal drugs. Some items may interact with your medicine. What should I watch for while using this medication? Visit your doctor for checks on your progress. This drug may make you feel generally unwell. This is not uncommon, as chemotherapy can affect healthy cells as well as cancer cells. Report any side effects. Continue your course of treatment even though you feel ill unless your doctor tells you to stop. In some cases, you may be given additional medicines to help with side effects. Follow all directions for their use. Call your doctor or health care professional for advice if you get a fever, chills or sore throat, or other symptoms of a cold or flu. Do not treat yourself. This drug decreases your body's ability to fight infections. Try to avoid being around people who are sick. This medicine may increase your risk to bruise or bleed. Call your doctor or health care professional if you notice any unusual bleeding. Be careful brushing and flossing your teeth or using a toothpick because you may get an infection or bleed more easily. If you have any dental work done, tell your dentist you are  receiving this medicine. Avoid taking products that contain aspirin, acetaminophen, ibuprofen, naproxen, or ketoprofen unless instructed by your doctor. These medicines may hide a fever. Do not become pregnant while taking this medicine. Women should inform their doctor if they wish to become pregnant or think they might be pregnant. There is a potential for serious side effects to an unborn child. Talk to your health care professional or pharmacist for more information. Do not breast-feed an infant while taking this medicine. Men should inform  their doctor if they wish to father a child. This medicine may lower sperm counts. Do not treat diarrhea with over the counter products. Contact your doctor if you have diarrhea that lasts more than 2 days or if it is severe and watery. This medicine can make you more sensitive to the sun. Keep out of the sun. If you cannot avoid being in the sun, wear protective clothing and use sunscreen. Do not use sun lamps or tanning beds/booths. What side effects may I notice from receiving this medication? Side effects that you should report to your doctor or health care professional as soon as possible: allergic reactions like skin rash, itching or hives, swelling of the face, lips, or tongue low blood counts - this medicine may decrease the number of white blood cells, red blood cells and platelets. You may be at increased risk for infections and bleeding. signs of infection - fever or chills, cough, sore throat, pain or difficulty passing urine signs of decreased platelets or bleeding - bruising, pinpoint red spots on the skin, black, tarry stools, blood in the urine signs of decreased red blood cells - unusually weak or tired, fainting spells, lightheadedness breathing problems changes in vision chest pain mouth sores nausea and vomiting pain, swelling, redness at site where injected pain, tingling, numbness in the hands or feet redness, swelling, or sores on  hands or feet stomach pain unusual bleeding Side effects that usually do not require medical attention (report to your doctor or health care professional if they continue or are bothersome): changes in finger or toe nails diarrhea dry or itchy skin hair loss headache loss of appetite sensitivity of eyes to the light stomach upset unusually teary eyes This list may not describe all possible side effects. Call your doctor for medical advice about side effects. You may report side effects to FDA at 1-800-FDA-1088. Where should I keep my medication? This drug is given in a hospital or clinic and will not be stored at home. NOTE: This sheet is a summary. It may not cover all possible information. If you have questions about this medicine, talk to your doctor, pharmacist, or health care provider.  2022 Elsevier/Gold Standard (2019-09-20 15:00:03)

## 2021-08-12 NOTE — Progress Notes (Signed)
Per Dr. Benay Spice, ok to treat with CMP/CBC from 07/18/21.  This is patient's first treatment of vectibix/folfiri.

## 2021-08-12 NOTE — Progress Notes (Signed)
Refills sent to [facility] for ongoing cancer pain management. Will see in palliative care clinic for follow-up.  Lane Hacker, DO Palliative Medicine

## 2021-08-13 ENCOUNTER — Ambulatory Visit: Payer: Self-pay | Admitting: Oncology

## 2021-08-13 ENCOUNTER — Telehealth: Payer: Self-pay

## 2021-08-13 ENCOUNTER — Other Ambulatory Visit: Payer: Self-pay

## 2021-08-13 ENCOUNTER — Ambulatory Visit: Payer: Self-pay

## 2021-08-13 NOTE — Telephone Encounter (Signed)
Attempted to contact patient for first time chemo follow-up.  Left Voice message for patient to contact office with any questions or concerns.  Reminded patient of pump disconnect appointment scheduled for 08/14/21 at 11:30am.

## 2021-08-13 NOTE — Telephone Encounter (Signed)
Patient returned call for first time chemo follow-up.  Patient stated he has tolerated chemo well without any issues.  Reminded patient of appointment for pump stop on 08/14/21. Patient verbalized understanding and agreed to call office with any questions or concerns.

## 2021-08-14 ENCOUNTER — Inpatient Hospital Stay: Payer: Medicaid Other

## 2021-08-14 ENCOUNTER — Other Ambulatory Visit: Payer: Self-pay

## 2021-08-14 VITALS — BP 132/75 | HR 53 | Temp 98.6°F | Resp 18

## 2021-08-14 DIAGNOSIS — Z5112 Encounter for antineoplastic immunotherapy: Secondary | ICD-10-CM | POA: Diagnosis not present

## 2021-08-14 DIAGNOSIS — C20 Malignant neoplasm of rectum: Secondary | ICD-10-CM

## 2021-08-14 MED ORDER — HEPARIN SOD (PORK) LOCK FLUSH 100 UNIT/ML IV SOLN
500.0000 [IU] | Freq: Once | INTRAVENOUS | Status: AC | PRN
  Administered 2021-08-14: 500 [IU]

## 2021-08-14 MED ORDER — SODIUM CHLORIDE 0.9% FLUSH
10.0000 mL | INTRAVENOUS | Status: DC | PRN
  Administered 2021-08-14: 10 mL

## 2021-08-19 ENCOUNTER — Inpatient Hospital Stay: Payer: Medicaid Other

## 2021-08-19 ENCOUNTER — Inpatient Hospital Stay: Payer: Medicaid Other | Admitting: Oncology

## 2021-08-21 ENCOUNTER — Inpatient Hospital Stay: Payer: Medicaid Other

## 2021-08-21 ENCOUNTER — Encounter: Payer: Self-pay | Admitting: Oncology

## 2021-08-22 ENCOUNTER — Other Ambulatory Visit: Payer: Self-pay | Admitting: *Deleted

## 2021-08-22 MED ORDER — PANTOPRAZOLE SODIUM 40 MG PO TBEC: 40.0000 mg | DELAYED_RELEASE_TABLET | Freq: Every day | ORAL | 3 refills | Status: DC

## 2021-08-22 NOTE — Progress Notes (Signed)
Sent script for Protonix per Dr. Benay Spice order. Patient is aware.

## 2021-08-23 ENCOUNTER — Encounter: Payer: Self-pay | Admitting: Oncology

## 2021-08-25 ENCOUNTER — Other Ambulatory Visit: Payer: Self-pay | Admitting: Oncology

## 2021-08-26 ENCOUNTER — Inpatient Hospital Stay: Payer: Medicaid Other

## 2021-08-26 ENCOUNTER — Inpatient Hospital Stay: Payer: Medicaid Other | Admitting: Oncology

## 2021-08-28 ENCOUNTER — Inpatient Hospital Stay: Payer: Medicaid Other

## 2021-08-28 ENCOUNTER — Encounter: Payer: Self-pay | Admitting: Oncology

## 2021-08-30 ENCOUNTER — Other Ambulatory Visit (HOSPITAL_BASED_OUTPATIENT_CLINIC_OR_DEPARTMENT_OTHER): Payer: Self-pay

## 2021-08-30 ENCOUNTER — Inpatient Hospital Stay: Payer: Medicaid Other

## 2021-08-30 ENCOUNTER — Telehealth: Payer: Self-pay

## 2021-08-30 ENCOUNTER — Inpatient Hospital Stay (HOSPITAL_BASED_OUTPATIENT_CLINIC_OR_DEPARTMENT_OTHER): Payer: Medicaid Other | Admitting: Oncology

## 2021-08-30 ENCOUNTER — Other Ambulatory Visit: Payer: Self-pay

## 2021-08-30 VITALS — BP 129/82 | HR 73 | Temp 98.2°F | Resp 18 | Ht 67.5 in | Wt 193.9 lb

## 2021-08-30 DIAGNOSIS — Z5112 Encounter for antineoplastic immunotherapy: Secondary | ICD-10-CM | POA: Diagnosis not present

## 2021-08-30 DIAGNOSIS — C20 Malignant neoplasm of rectum: Secondary | ICD-10-CM

## 2021-08-30 LAB — CBC WITH DIFFERENTIAL (CANCER CENTER ONLY)
Abs Immature Granulocytes: 0.08 10*3/uL — ABNORMAL HIGH (ref 0.00–0.07)
Basophils Absolute: 0 10*3/uL (ref 0.0–0.1)
Basophils Relative: 0 %
Eosinophils Absolute: 0 10*3/uL (ref 0.0–0.5)
Eosinophils Relative: 0 %
HCT: 36.4 % — ABNORMAL LOW (ref 39.0–52.0)
Hemoglobin: 11.7 g/dL — ABNORMAL LOW (ref 13.0–17.0)
Immature Granulocytes: 1 %
Lymphocytes Relative: 19 %
Lymphs Abs: 2 10*3/uL (ref 0.7–4.0)
MCH: 28.3 pg (ref 26.0–34.0)
MCHC: 32.1 g/dL (ref 30.0–36.0)
MCV: 87.9 fL (ref 80.0–100.0)
Monocytes Absolute: 0.8 10*3/uL (ref 0.1–1.0)
Monocytes Relative: 8 %
Neutro Abs: 7.5 10*3/uL (ref 1.7–7.7)
Neutrophils Relative %: 72 %
Platelet Count: 286 10*3/uL (ref 150–400)
RBC: 4.14 MIL/uL — ABNORMAL LOW (ref 4.22–5.81)
RDW: 14.3 % (ref 11.5–15.5)
WBC Count: 10.4 10*3/uL (ref 4.0–10.5)
nRBC: 0 % (ref 0.0–0.2)

## 2021-08-30 LAB — MAGNESIUM: Magnesium: 1.9 mg/dL (ref 1.7–2.4)

## 2021-08-30 LAB — CMP (CANCER CENTER ONLY)
ALT: 20 U/L (ref 0–44)
AST: 11 U/L — ABNORMAL LOW (ref 15–41)
Albumin: 3.6 g/dL (ref 3.5–5.0)
Alkaline Phosphatase: 64 U/L (ref 38–126)
Anion gap: 7 (ref 5–15)
BUN: 10 mg/dL (ref 6–20)
CO2: 29 mmol/L (ref 22–32)
Calcium: 8.8 mg/dL — ABNORMAL LOW (ref 8.9–10.3)
Chloride: 104 mmol/L (ref 98–111)
Creatinine: 0.95 mg/dL (ref 0.61–1.24)
GFR, Estimated: >60 mL/min (ref >60)
Glucose, Bld: 118 mg/dL — ABNORMAL HIGH (ref 70–99)
Potassium: 4.2 mmol/L (ref 3.5–5.1)
Sodium: 140 mmol/L (ref 135–145)
Total Bilirubin: 0.3 mg/dL (ref 0.3–1.2)
Total Protein: 5.9 g/dL — ABNORMAL LOW (ref 6.5–8.1)

## 2021-08-30 MED ORDER — DIPHENOXYLATE-ATROPINE 2.5-0.025 MG PO TABS
ORAL_TABLET | ORAL | 2 refills | Status: DC
  Filled 2021-08-30: qty 60, 7d supply, fill #0

## 2021-08-30 MED ORDER — DIPHENOXYLATE-ATROPINE 2.5-0.025 MG PO TABS: 2.0000 | ORAL_TABLET | Freq: Four times a day (QID) | ORAL | 2 refills | Status: DC | PRN

## 2021-08-30 MED ORDER — DIPHENOXYLATE-ATROPINE 2.5-0.025 MG PO TABS: 1.0000 | ORAL_TABLET | Freq: Four times a day (QID) | ORAL | 0 refills | Status: DC | PRN

## 2021-08-30 MED ORDER — ONDANSETRON HCL 8 MG PO TABS
8.0000 mg | ORAL_TABLET | Freq: Three times a day (TID) | ORAL | 0 refills | Status: DC | PRN
  Filled 2021-08-30: qty 30, 10d supply, fill #0

## 2021-08-30 NOTE — Telephone Encounter (Signed)
Called patient to confirm dosing instructions for patient's lomotil prescription.  Left voicemail stating for patient to take 1 lomotil tablet every 4 hours. Left instructions for patient to call office with any questions or concerns.

## 2021-08-30 NOTE — Progress Notes (Signed)
[facility] OFFICE PROGRESS NOTE   Diagnosis: Rectal cancer  INTERVAL HISTORY:   Mr Roots completed cycle 1 FOLFIRI/panitumumab on 08/12/2021.  He developed nausea, vomiting, and diarrhea several days following chemotherapy.  The nausea improved with ondansetron.  Diarrhea improved with Imodium.  He continues to have frequent bowel movements, but the stool is formed.  He used multiple Imodium tablets per day.  This is expensive.  He would like to switch to a prescription diarrhea medication.  He has burning in the rectum.  The rectal/perineal pain is generally under good control with the current pain regimen.  No mouth sores.  Objective:  Vital signs in last 24 hours:  Blood pressure 129/82, pulse 73, temperature 98.2 F (36.8 C), temperature source Oral, resp. rate 18, height 5' 7.5" (1.715 m), weight 193 lb 14.4 oz (88 kg), SpO2 98 %.    HEENT: No thrush or ulcers Resp: Lungs clear bilaterally Cardio: Regular rate and rhythm GI: No hepatosplenomegaly, nontender Vascular: No leg edema  Skin: Palms without erythema, no rash  Portacath/PICC-without erythema  Lab Results:  Lab Results  Component Value Date   WBC 10.4 08/30/2021   HGB 11.7 (L) 08/30/2021   HCT 36.4 (L) 08/30/2021   MCV 87.9 08/30/2021   PLT 286 08/30/2021   NEUTROABS 7.5 08/30/2021    CMP  Lab Results  Component Value Date   NA 140 08/30/2021   K 4.2 08/30/2021   CL 104 08/30/2021   CO2 29 08/30/2021   GLUCOSE 118 (H) 08/30/2021   BUN 10 08/30/2021   CREATININE 0.95 08/30/2021   CALCIUM 8.8 (L) 08/30/2021   PROT 5.9 (L) 08/30/2021   ALBUMIN 3.6 08/30/2021   AST 11 (L) 08/30/2021   ALT 20 08/30/2021   ALKPHOS 64 08/30/2021   BILITOT 0.3 08/30/2021   GFRNONAA >60 08/30/2021   GFRAA >60 01/02/2020    Lab Results  Component Value Date   CEA1 59.33 (H) 08/12/2021   CEA 39.25 (H) 08/12/2021    Medications: I have reviewed the patient's current  medications.   Assessment/Plan:  Rectal cancer, clinical stage III (T3cN2), Foundation 1 on lymph node 11/18/2019-tumor mutation burden 1, FG R1 amplification, K-ras wild-type, microsatellite status could not be determined Rectal mass extending from 4-9 cm on colonoscopy 02/15/2019 with a biopsy confirming invasive adenocarcinoma CTs 02/23/2019- thickening of the low rectum without a discrete mass, small bilateral pulmonary nodules, some present on imaging from 2014 Pelvic MRI 02/25/2019- T3cN2 tumor beginning at 4.9 cm from the anal verge and 0 cm from the internal anal sphincter, tumor extends through the muscularis propria, 2.  Rectal lymph nodes and 2 enlarged right internal iliac nodes CTs at [facility] 04/19/2019- new subcentimeter right hepatic lesion concerning for metastasis, stable low rectal mass with prominent mesorectal lymph nodes, stable pathologic enlarged right internal iliac nodes, stable pulmonary nodules Xeloda/radiation beginning 04/26/2019, completed 07/05/2019 CT 07/26/2019-growth of previously described right hepatic lesion, slight decrease in pelvic lymphadenopathy and rectal thickening 08/05/2019-week 1 bolus 5-FU/leucovorin (200 mg/m) 08/12/2019-week to bolus 5-FU/leucovorin (400 mg/m) 10/16/ 2020-week 3 bolus 5-FU/leucovorin (400 mg/m) and oxaliplatin (42.5 mg/m) CT chest 10/04/2019-slight increase in bilateral pulmonary nodules concerning for metastases MRI abdomen 10/06/2019-right hepatic lesion consistent with metastasis, wall thickening of the low anterior rectum, no new or enlarging adenopathy, stable mesorectal and right internal iliac nodes PET scan 10/14/2019-pulmonary nodules below PET resolution, hypermetabolic right posterior hepatic lesion, mildly hypermetabolic partially calcified right internal iliac chain lymphadenopathy, mild gastric hypermetabolism and thickening-likely gastritis 11/18/2019-LAR  with diverting loop ileostomy and ablation of solitary liver metastasis (2  cm segment 7), moderately differentiated adenocarcinoma invading into soft tissue, LVI present, perineural invasion present, 5/15 lymph nodes positive, 4 tumor deposits, absent treatment effect-score 3,ypT3 ypN2a, mismatch repair protein expression intact, MSS CT chest 01/16/2020-increase in the size of a dominant right lower lobe nodule, unchanged left upper lobe and left lower lobe nodules CT chest 04/11/2020-enlargement of bilateral lung nodules, and new nodules CT chest 09/11/2020-enlargement of bilateral lung nodules, aggressive medial right liver lesion Cycle 1 FOLFOX 10/31/2020 Cycle 2 FOLFOX 11/14/2020 Cycle 3 FOLFOX 11/29/2020 CT chest 01/03/2021-multiple bilateral lung nodules, stable to slightly decreased in size, no new lesions, hepatic dome lesion is smaller (ablated lesion?) Cycle 4 FOLFOX 01/21/2021 CTs 07/02/2021-increase in size and number of pulmonary metastases, new mediastinal lymph node, increased right hepatic lobe metastasis Targeted bronchoscopy of right lower lobe lesion 07/18/2021-brushing revealed malignant cells consistent with adenocarcinoma Cycle 1 FOLFIRI/panitumumab 08/12/2021 Cycle 2 FOLFIRI/panitumumab 09/02/2021 Pain secondary to #1, improved Reactive airways disease/asthma Report of multiple allergens Port-A-Cath related DVT, right IJ, 09/30/2019-apixaban, converted to Lovenox surrounding surgery January 2021, changed back to apixaban 12/30/2019 Erectile dysfunction following treatment for rectal cancer-followed by urology Rectal/perineal pain following the low anterior resection-persistent      Disposition: [name] appears stable.  He completed 1 cycle of FOLFIRI/panitumumab.  This was complicated by nausea/vomiting and diarrhea.  His symptoms have improved.  I will add Emend with cycle 2.  We will prescribe Lomotil to use as needed for diarrhea.  He will return for cycle 2 FOLFIRI/panitumumab on 09/02/2021.  He will be scheduled for an office visit prior to cycle 3  on 08/16/2021.  Betsy Coder, MD  08/30/2021  1:03 PM

## 2021-09-02 ENCOUNTER — Encounter: Payer: Self-pay | Admitting: Oncology

## 2021-09-02 ENCOUNTER — Inpatient Hospital Stay: Payer: Medicaid Other

## 2021-09-02 ENCOUNTER — Telehealth: Payer: Self-pay | Admitting: Oncology

## 2021-09-02 NOTE — Telephone Encounter (Signed)
Rescheduled appt from 10/31 treatment to 09/10/21 - patient is aware of appt date and time.    Patient was not able to come in on 09/02/21 due to family being sick and unable to get transportation.

## 2021-09-04 ENCOUNTER — Encounter: Payer: Self-pay | Admitting: Oncology

## 2021-09-09 ENCOUNTER — Telehealth: Payer: Self-pay

## 2021-09-09 ENCOUNTER — Other Ambulatory Visit: Payer: Self-pay | Admitting: Internal Medicine

## 2021-09-09 NOTE — Telephone Encounter (Signed)
Multiple attempts >5 with contacting pt via telephone pt is unresponsive however has sent several my chart messages to request medication be sent to pharmacy for various ailments when asked to come into office pt is evasive and unresponsive. This nurse sends my chart message requesting pt to come into office today per provider(Dr. Benay Spice).  Separate/ previous appt made for tomorrow for lab port/flush visit with MD and infusion tomorrow 09/10/21  No response at the time of this note

## 2021-09-10 ENCOUNTER — Telehealth: Payer: Self-pay | Admitting: Oncology

## 2021-09-10 ENCOUNTER — Inpatient Hospital Stay: Payer: Medicaid Other

## 2021-09-10 ENCOUNTER — Inpatient Hospital Stay: Payer: Medicaid Other | Attending: Oncology

## 2021-09-10 ENCOUNTER — Other Ambulatory Visit: Payer: Self-pay

## 2021-09-10 ENCOUNTER — Inpatient Hospital Stay (HOSPITAL_BASED_OUTPATIENT_CLINIC_OR_DEPARTMENT_OTHER): Payer: Medicaid Other | Admitting: Oncology

## 2021-09-10 ENCOUNTER — Other Ambulatory Visit (HOSPITAL_COMMUNITY): Payer: Self-pay

## 2021-09-10 VITALS — BP 110/70 | HR 67 | Temp 98.7°F | Resp 18 | Ht 67.0 in | Wt 189.4 lb

## 2021-09-10 DIAGNOSIS — C20 Malignant neoplasm of rectum: Secondary | ICD-10-CM | POA: Diagnosis not present

## 2021-09-10 DIAGNOSIS — C7801 Secondary malignant neoplasm of right lung: Secondary | ICD-10-CM | POA: Diagnosis not present

## 2021-09-10 DIAGNOSIS — Z5111 Encounter for antineoplastic chemotherapy: Secondary | ICD-10-CM | POA: Insufficient documentation

## 2021-09-10 DIAGNOSIS — C787 Secondary malignant neoplasm of liver and intrahepatic bile duct: Secondary | ICD-10-CM | POA: Diagnosis not present

## 2021-09-10 DIAGNOSIS — Z452 Encounter for adjustment and management of vascular access device: Secondary | ICD-10-CM | POA: Diagnosis not present

## 2021-09-10 DIAGNOSIS — Z5112 Encounter for antineoplastic immunotherapy: Secondary | ICD-10-CM | POA: Insufficient documentation

## 2021-09-10 LAB — CBC WITH DIFFERENTIAL (CANCER CENTER ONLY)
Abs Immature Granulocytes: 0.42 10*3/uL — ABNORMAL HIGH (ref 0.00–0.07)
Basophils Absolute: 0 10*3/uL (ref 0.0–0.1)
Basophils Relative: 0 %
Eosinophils Absolute: 0.2 10*3/uL (ref 0.0–0.5)
Eosinophils Relative: 3 %
HCT: 31.6 % — ABNORMAL LOW (ref 39.0–52.0)
Hemoglobin: 10.2 g/dL — ABNORMAL LOW (ref 13.0–17.0)
Immature Granulocytes: 5 %
Lymphocytes Relative: 17 %
Lymphs Abs: 1.4 10*3/uL (ref 0.7–4.0)
MCH: 28 pg (ref 26.0–34.0)
MCHC: 32.3 g/dL (ref 30.0–36.0)
MCV: 86.8 fL (ref 80.0–100.0)
Monocytes Absolute: 1.1 10*3/uL — ABNORMAL HIGH (ref 0.1–1.0)
Monocytes Relative: 13 %
Neutro Abs: 5.3 10*3/uL (ref 1.7–7.7)
Neutrophils Relative %: 62 %
Platelet Count: 525 10*3/uL — ABNORMAL HIGH (ref 150–400)
RBC: 3.64 MIL/uL — ABNORMAL LOW (ref 4.22–5.81)
RDW: 14.6 % (ref 11.5–15.5)
WBC Count: 8.5 10*3/uL (ref 4.0–10.5)
nRBC: 0 % (ref 0.0–0.2)

## 2021-09-10 LAB — CMP (CANCER CENTER ONLY)
ALT: 21 U/L (ref 0–44)
AST: 15 U/L (ref 15–41)
Albumin: 3.2 g/dL — ABNORMAL LOW (ref 3.5–5.0)
Alkaline Phosphatase: 74 U/L (ref 38–126)
Anion gap: 9 (ref 5–15)
BUN: 10 mg/dL (ref 6–20)
CO2: 26 mmol/L (ref 22–32)
Calcium: 8.8 mg/dL — ABNORMAL LOW (ref 8.9–10.3)
Chloride: 101 mmol/L (ref 98–111)
Creatinine: 0.84 mg/dL (ref 0.61–1.24)
GFR, Estimated: >60 mL/min (ref >60)
Glucose, Bld: 104 mg/dL — ABNORMAL HIGH (ref 70–99)
Potassium: 4.6 mmol/L (ref 3.5–5.1)
Sodium: 136 mmol/L (ref 135–145)
Total Bilirubin: 0.9 mg/dL (ref 0.3–1.2)
Total Protein: 6.5 g/dL (ref 6.5–8.1)

## 2021-09-10 LAB — CEA (ACCESS): CEA (CHCC): 30.33 ng/mL — ABNORMAL HIGH (ref 0.00–5.00)

## 2021-09-10 LAB — MAGNESIUM: Magnesium: 1.9 mg/dL (ref 1.7–2.4)

## 2021-09-10 MED ORDER — OXYCODONE HCL 20 MG PO TABS
20.0000 mg | ORAL_TABLET | Freq: Three times a day (TID) | ORAL | 0 refills | Status: DC | PRN
  Filled 2021-09-10: qty 90, 30d supply, fill #0

## 2021-09-10 MED ORDER — MORPHINE SULFATE ER 60 MG PO TBCR
60.0000 mg | EXTENDED_RELEASE_TABLET | Freq: Two times a day (BID) | ORAL | 0 refills | Status: DC
  Filled 2021-09-10: qty 60, 30d supply, fill #0

## 2021-09-10 NOTE — Progress Notes (Signed)
[facility] OFFICE PROGRESS NOTE   Diagnosis: Rectal cancer  INTERVAL HISTORY:   Bradley Colon missed scheduled chemotherapy on 09/02/2021 secondary to a flu illness in his family.  He did not develop symptoms.  He reports developing "leg swelling "last week.  He had bilateral leg swelling.  This occurred after eating [place] food 3 days in a row followed by a buffet.  He reports increased salt intake.  The leg swelling has now improved.  He has a good appetite.  Rectal pain is under control.  Objective:  Vital signs in last 24 hours:  Blood pressure 110/70, pulse 67, temperature 98.7 F (37.1 C), temperature source Oral, resp. rate 18, height '5\' 7"'  (1.702 m), weight 189 lb 6.4 oz (85.9 kg), SpO2 100 %.   Resp: Lungs clear bilaterally Cardio: Regular rate and rhythm GI: Nontender, no hepatosplenomegaly Vascular: Trace pitting edema at the lower leg bilaterally  Skin: Healing superficial ulcers at the outer lower lip and immediately beneath the nose  Portacath/PICC-without erythema  Lab Results:  Lab Results  Component Value Date   WBC 8.5 09/10/2021   HGB 10.2 (L) 09/10/2021   HCT 31.6 (L) 09/10/2021   MCV 86.8 09/10/2021   PLT 525 (H) 09/10/2021   NEUTROABS 5.3 09/10/2021    CMP  Lab Results  Component Value Date   NA 140 08/30/2021   K 4.2 08/30/2021   CL 104 08/30/2021   CO2 29 08/30/2021   GLUCOSE 118 (H) 08/30/2021   BUN 10 08/30/2021   CREATININE 0.95 08/30/2021   CALCIUM 8.8 (L) 08/30/2021   PROT 5.9 (L) 08/30/2021   ALBUMIN 3.6 08/30/2021   AST 11 (L) 08/30/2021   ALT 20 08/30/2021   ALKPHOS 64 08/30/2021   BILITOT 0.3 08/30/2021   GFRNONAA >60 08/30/2021   GFRAA >60 01/02/2020    Lab Results  Component Value Date   CEA1 59.33 (H) 08/12/2021   CEA 39.25 (H) 08/12/2021    Medications: I have reviewed the patient's current medications.   Assessment/Plan: Rectal cancer, clinical stage III (T3cN2), Foundation 1 on lymph node  11/18/2019-tumor mutation burden 1, FG R1 amplification, K-ras wild-type, microsatellite status could not be determined Rectal mass extending from 4-9 cm on colonoscopy 02/15/2019 with a biopsy confirming invasive adenocarcinoma CTs 02/23/2019- thickening of the low rectum without a discrete mass, small bilateral pulmonary nodules, some present on imaging from 2014 Pelvic MRI 02/25/2019- T3cN2 tumor beginning at 4.9 cm from the anal verge and 0 cm from the internal anal sphincter, tumor extends through the muscularis propria, 2.  Rectal lymph nodes and 2 enlarged right internal iliac nodes CTs at [facility] 04/19/2019- new subcentimeter right hepatic lesion concerning for metastasis, stable low rectal mass with prominent mesorectal lymph nodes, stable pathologic enlarged right internal iliac nodes, stable pulmonary nodules Xeloda/radiation beginning 04/26/2019, completed 07/05/2019 CT 07/26/2019-growth of previously described right hepatic lesion, slight decrease in pelvic lymphadenopathy and rectal thickening 08/05/2019-week 1 bolus 5-FU/leucovorin (200 mg/m) 08/12/2019-week to bolus 5-FU/leucovorin (400 mg/m) 10/16/ 2020-week 3 bolus 5-FU/leucovorin (400 mg/m) and oxaliplatin (42.5 mg/m) CT chest 10/04/2019-slight increase in bilateral pulmonary nodules concerning for metastases MRI abdomen 10/06/2019-right hepatic lesion consistent with metastasis, wall thickening of the low anterior rectum, no new or enlarging adenopathy, stable mesorectal and right internal iliac nodes PET scan 10/14/2019-pulmonary nodules below PET resolution, hypermetabolic right posterior hepatic lesion, mildly hypermetabolic partially calcified right internal iliac chain lymphadenopathy, mild gastric hypermetabolism and thickening-likely gastritis 11/18/2019-LAR with diverting loop ileostomy and ablation of solitary  liver metastasis (2 cm segment 7), moderately differentiated adenocarcinoma invading into soft tissue, LVI present, perineural  invasion present, 5/15 lymph nodes positive, 4 tumor deposits, absent treatment effect-score 3,ypT3 ypN2a, mismatch repair protein expression intact, MSS CT chest 01/16/2020-increase in the size of a dominant right lower lobe nodule, unchanged left upper lobe and left lower lobe nodules CT chest 04/11/2020-enlargement of bilateral lung nodules, and new nodules CT chest 09/11/2020-enlargement of bilateral lung nodules, aggressive medial right liver lesion Cycle 1 FOLFOX 10/31/2020 Cycle 2 FOLFOX 11/14/2020 Cycle 3 FOLFOX 11/29/2020 CT chest 01/03/2021-multiple bilateral lung nodules, stable to slightly decreased in size, no new lesions, hepatic dome lesion is smaller (ablated lesion?) Cycle 4 FOLFOX 01/21/2021 CTs 07/02/2021-increase in size and number of pulmonary metastases, new mediastinal lymph node, increased right hepatic lobe metastasis Targeted bronchoscopy of right lower lobe lesion 07/18/2021-brushing revealed malignant cells consistent with adenocarcinoma Cycle 1 FOLFIRI/panitumumab 08/12/2021 Cycle 2 FOLFIRI/panitumumab 09/11/2021 Pain secondary to #1, improved Reactive airways disease/asthma Report of multiple allergens Port-A-Cath related DVT, right IJ, 09/30/2019-apixaban, converted to Lovenox surrounding surgery January 2021, changed back to apixaban 12/30/2019 Erectile dysfunction following treatment for rectal cancer-followed by urology Rectal/perineal pain following the low anterior resection-persistent        Disposition: Bradley Skelley appears stable.  He will return for cycle 2 FOLFIRI/panitumumab on 09/11/2021.  The leg swelling he developed over the past week may have been related to increased salt intake.  He appears stable to proceed with chemotherapy tomorrow.  He will return for an office visit in the next cycle of FOLFIRI/panitumumab on 10/01/2021.  The ulcers at the the lips may be related to panitumumab versus a virus.  The ulcers appear to be healing.  He will begin doxycycline  prophylaxis.  He continues treatment of the peroneal pain with Dr. Hilma Favors.   Betsy Coder, MD  09/10/2021  12:13 PM

## 2021-09-10 NOTE — Telephone Encounter (Signed)
Scheduled appointment per 11/07 los. Left message.

## 2021-09-11 ENCOUNTER — Other Ambulatory Visit (HOSPITAL_COMMUNITY): Payer: Self-pay

## 2021-09-11 ENCOUNTER — Inpatient Hospital Stay: Payer: Medicaid Other

## 2021-09-11 ENCOUNTER — Other Ambulatory Visit: Payer: Self-pay

## 2021-09-11 VITALS — BP 132/81 | HR 99 | Temp 98.2°F | Resp 18

## 2021-09-11 DIAGNOSIS — C20 Malignant neoplasm of rectum: Secondary | ICD-10-CM

## 2021-09-11 DIAGNOSIS — Z5112 Encounter for antineoplastic immunotherapy: Secondary | ICD-10-CM | POA: Diagnosis not present

## 2021-09-11 MED ORDER — SODIUM CHLORIDE 0.9 % IV SOLN
150.0000 mg | Freq: Once | INTRAVENOUS | Status: AC
  Administered 2021-09-11: 150 mg via INTRAVENOUS
  Filled 2021-09-11: qty 5

## 2021-09-11 MED ORDER — FLUOROURACIL CHEMO INJECTION 2.5 GM/50ML
300.0000 mg/m2 | Freq: Once | INTRAVENOUS | Status: AC
  Administered 2021-09-11: 600 mg via INTRAVENOUS
  Filled 2021-09-11: qty 12

## 2021-09-11 MED ORDER — SODIUM CHLORIDE 0.9 % IV SOLN
1600.0000 mg/m2 | INTRAVENOUS | Status: DC
  Administered 2021-09-11: 3300 mg via INTRAVENOUS
  Filled 2021-09-11: qty 66

## 2021-09-11 MED ORDER — SODIUM CHLORIDE 0.9 % IV SOLN: Freq: Once | INTRAVENOUS | Status: AC

## 2021-09-11 MED ORDER — DOXYCYCLINE HYCLATE 100 MG PO TABS
100.0000 mg | ORAL_TABLET | Freq: Two times a day (BID) | ORAL | 2 refills | Status: DC
  Filled 2021-09-11: qty 30, 15d supply, fill #0

## 2021-09-11 MED ORDER — SODIUM CHLORIDE 0.9 % IV SOLN
140.0000 mg/m2 | Freq: Once | INTRAVENOUS | Status: AC
  Administered 2021-09-11: 280 mg via INTRAVENOUS
  Filled 2021-09-11: qty 14

## 2021-09-11 MED ORDER — PALONOSETRON HCL INJECTION 0.25 MG/5ML
0.2500 mg | Freq: Once | INTRAVENOUS | Status: AC
  Administered 2021-09-11: 0.25 mg via INTRAVENOUS
  Filled 2021-09-11: qty 5

## 2021-09-11 MED ORDER — SODIUM CHLORIDE 0.9 % IV SOLN
10.0000 mg | Freq: Once | INTRAVENOUS | Status: AC
  Administered 2021-09-11: 10 mg via INTRAVENOUS
  Filled 2021-09-11: qty 1

## 2021-09-11 MED ORDER — SODIUM CHLORIDE 0.9 % IV SOLN
6.0000 mg/kg | Freq: Once | INTRAVENOUS | Status: AC
  Administered 2021-09-11: 500 mg via INTRAVENOUS
  Filled 2021-09-11: qty 5

## 2021-09-11 MED ORDER — ATROPINE SULFATE 1 MG/ML IV SOLN
0.5000 mg | Freq: Once | INTRAVENOUS | Status: AC | PRN
  Administered 2021-09-11: 0.5 mg via INTRAVENOUS
  Filled 2021-09-11: qty 1

## 2021-09-11 MED ORDER — SODIUM CHLORIDE 0.9 % IV SOLN
300.0000 mg/m2 | Freq: Once | INTRAVENOUS | Status: AC
  Administered 2021-09-11: 616 mg via INTRAVENOUS
  Filled 2021-09-11: qty 30.8

## 2021-09-11 NOTE — Progress Notes (Signed)
Per provider doxycycline 100mg  BID for skin irritation/rash secondary to chemo completed sent to [facility]

## 2021-09-11 NOTE — Progress Notes (Signed)
Patient presents for treatment. RN assessment completed along with the following:  Labs/vitals reviewed - Yes, and within treatment parameters.   Weight within 10% of previous measurement - Yes Oncology Treatment Attestation completed for current therapy- Yes, on date 07/22/21 Informed consent completed and reflects current therapy/intent - Yes, on date 08/12/21             Provider progress note reviewed - Patient not seen by provider today. Most recent note dated 09/10/21 reviewed. Treatment/Antibody/Supportive plan reviewed - Yes, and there are no adjustments needed for today's treatment. S&H and other orders reviewed - Yes, and there are no additional orders identified. Previous treatment date reviewed - Yes, and the appropriate amount of time has elapsed between treatments.  Patient to proceed with treatment.    Patient asked if he could have a tooth pulled while on chemo treatments.  Per Dr. Benay Spice, ok for patient to have tooth pulled.

## 2021-09-11 NOTE — Patient Instructions (Signed)
[facility]  Discharge Instructions: Thank you for choosing [facility] to provide your oncology and hematology care.   If you have a lab appointment with the [facility], please go directly to the [facility] and check in at the registration area.   Wear comfortable clothing and clothing appropriate for easy access to any Portacath or PICC line.   We strive to give you quality time with your provider. You may need to reschedule your appointment if you arrive late (15 or more minutes).  Arriving late affects you and other patients whose appointments are after yours.  Also, if you miss three or more appointments without notifying the office, you may be dismissed from the clinic at the provider's discretion.      For prescription refill requests, have your pharmacy contact our office and allow 72 hours for refills to be completed.    Today you received the following chemotherapy and/or immunotherapy agents VECTIBEX, IRINOTECAN, LEUCOVORIN, 5 FU      To help prevent nausea and vomiting after your treatment, we encourage you to take your nausea medication as directed.  BELOW ARE SYMPTOMS THAT SHOULD BE REPORTED IMMEDIATELY: *FEVER GREATER THAN 100.4 F (38 C) OR HIGHER *CHILLS OR SWEATING *NAUSEA AND VOMITING THAT IS NOT CONTROLLED WITH YOUR NAUSEA MEDICATION *UNUSUAL SHORTNESS OF BREATH *UNUSUAL BRUISING OR BLEEDING *URINARY PROBLEMS (pain or burning when urinating, or frequent urination) *BOWEL PROBLEMS (unusual diarrhea, constipation, pain near the anus) TENDERNESS IN MOUTH AND THROAT WITH OR WITHOUT PRESENCE OF ULCERS (sore throat, sores in mouth, or a toothache) UNUSUAL RASH, SWELLING OR PAIN  UNUSUAL VAGINAL DISCHARGE OR ITCHING   Items with * indicate a potential emergency and should be followed up as soon as possible or go to the Emergency Department if any problems should occur.  Please show the CHEMOTHERAPY ALERT CARD or IMMUNOTHERAPY  ALERT CARD at check-in to the Emergency Department and triage nurse.  Should you have questions after your visit or need to cancel or reschedule your appointment, please contact [facility]  Dept: [phone]  and follow the prompts.  Office hours are 8:00 a.m. to 4:30 p.m. Monday - Friday. Please note that voicemails left after 4:00 p.m. may not be returned until the following business day.  We are closed weekends and major holidays. You have access to a nurse at all times for urgent questions. Please call the main number to the clinic Dept: [phone] and follow the prompts.   For any non-urgent questions, you may also contact your provider using MyChart. We now offer e-Visits for anyone 24 and older to request care online for non-urgent symptoms. For details visit [email or web address].   Also download the MyChart app! Go to the app store, search "MyChart", open the app, select [facility], and log in with your MyChart username and password.  Due to Covid, a mask is required upon entering the hospital/clinic. If you do not have a mask, one will be given to you upon arrival. For doctor visits, patients may have 1 support person aged 50 or older with them. For treatment visits, patients cannot have anyone with them due to current Covid guidelines and our immunocompromised population.   Panitumumab Solution for Injection What is this medication? PANITUMUMAB (pan i TOOM ue mab) is a monoclonal antibody. It is used to treat colorectal cancer. This medicine may be used for other purposes; ask your health care provider or pharmacist if you have questions. COMMON  BRAND NAME(S): Vectibix What should I tell my care team before I take this medication? They need to know if you have any of these conditions: eye disease, vision problems low levels of calcium, magnesium, or potassium in the blood lung or breathing disease, like asthma skin conditions or sensitivity an  unusual or allergic reaction to panitumumab, other medicines, foods, dyes, or preservatives pregnant or trying to get pregnant breast-feeding How should I use this medication? This drug is given as an infusion into a vein. It is administered in a hospital or clinic by a specially trained health care professional. Talk to your pediatrician regarding the use of this medicine in children. Special care may be needed. Overdosage: If you think you have taken too much of this medicine contact a poison control center or emergency room at once. NOTE: This medicine is only for you. Do not share this medicine with others. What if I miss a dose? It is important not to miss your dose. Call your doctor or health care professional if you are unable to keep an appointment. What may interact with this medication? Do not take this medicine with any of the following medications: bevacizumab This list may not describe all possible interactions. Give your health care provider a list of all the medicines, herbs, non-prescription drugs, or dietary supplements you use. Also tell them if you smoke, drink alcohol, or use illegal drugs. Some items may interact with your medicine. What should I watch for while using this medication? Visit your doctor for checks on your progress. This drug may make you feel generally unwell. This is not uncommon, as chemotherapy can affect healthy cells as well as cancer cells. Report any side effects. Continue your course of treatment even though you feel ill unless your doctor tells you to stop. This medicine can make you more sensitive to the sun. Keep out of the sun while receiving this medicine and for 2 months after the last dose. If you cannot avoid being in the sun, wear protective clothing and use sunscreen. Do not use sun lamps or tanning beds/booths. In some cases, you may be given additional medicines to help with side effects. Follow all directions for their use. Call your doctor  or health care professional for advice if you get a fever, chills or sore throat, or other symptoms of a cold or flu. Do not treat yourself. This drug decreases your body's ability to fight infections. Try to avoid being around people who are sick. Avoid taking products that contain aspirin, acetaminophen, ibuprofen, naproxen, or ketoprofen unless instructed by your doctor. These medicines may hide a fever. Do not become pregnant while taking this medicine and for 2 months after the last dose. Women should inform their doctor if they wish to become pregnant or think they might be pregnant. There is a potential for serious side effects to an unborn child. Talk to your health care professional or pharmacist for more information. Do not breast-feed an infant while taking this medicine or for 2 months after the last dose. What side effects may I notice from receiving this medication? Side effects that you should report to your doctor or health care professional as soon as possible: allergic reactions like skin rash, itching or hives, swelling of the face, lips, or tongue breathing problems changes in vision eye pain fast, irregular heartbeat fever, chills mouth sores red spots on the skin redness, blistering, peeling or loosening of the skin, including inside the mouth signs and symptoms of kidney  injury like trouble passing urine or change in the amount of urine signs and symptoms of low blood pressure like dizziness; feeling faint or lightheaded, falls; unusually weak or tired signs of low calcium like fast heartbeat, muscle cramps or muscle pain; pain, tingling, numbness in the hands or feet; seizures signs and symptoms of low magnesium like muscle cramps, pain, or weakness; tremors; seizures; or fast, irregular heartbeat signs and symptoms of low potassium like muscle cramps or muscle pain; chest pain; dizziness; feeling faint or lightheaded, falls; palpitations; breathing problems; or fast,  irregular heartbeat swelling of the ankles, feet, hands Side effects that usually do not require medical attention (report to your doctor or health care professional if they continue or are bothersome): changes in skin like acne, cracks, skin dryness diarrhea eyelash growth headache mouth sores nail changes nausea, vomiting This list may not describe all possible side effects. Call your doctor for medical advice about side effects. You may report side effects to FDA at 1-800-FDA-1088. Where should I keep my medication? This drug is given in a hospital or clinic and will not be stored at home. NOTE: This sheet is a summary. It may not cover all possible information. If you have questions about this medicine, talk to your doctor, pharmacist, or health care provider.  2022 Elsevier/Gold Standard (2016-05-09 16:45:04)   Irinotecan injection What is this medication? IRINOTECAN (ir in oh TEE kan ) is a chemotherapy drug. It is used to treat colon and rectal cancer. This medicine may be used for other purposes; ask your health care provider or pharmacist if you have questions. COMMON BRAND NAME(S): Camptosar What should I tell my care team before I take this medication? They need to know if you have any of these conditions: dehydration diarrhea infection (especially a virus infection such as chickenpox, cold sores, or herpes) liver disease low blood counts, like low white cell, platelet, or red cell counts low levels of calcium, magnesium, or potassium in the blood recent or ongoing radiation therapy an unusual or allergic reaction to irinotecan, other medicines, foods, dyes, or preservatives pregnant or trying to get pregnant breast-feeding How should I use this medication? This drug is given as an infusion into a vein. It is administered in a hospital or clinic by a specially trained health care professional. Talk to your pediatrician regarding the use of this medicine in children.  Special care may be needed. Overdosage: If you think you have taken too much of this medicine contact a poison control center or emergency room at once. NOTE: This medicine is only for you. Do not share this medicine with others. What if I miss a dose? It is important not to miss your dose. Call your doctor or health care professional if you are unable to keep an appointment. What may interact with this medication? Do not take this medicine with any of the following medications: cobicistat itraconazole This medicine may interact with the following medications: antiviral medicines for HIV or AIDS certain antibiotics like rifampin or rifabutin certain medicines for fungal infections like ketoconazole, posaconazole, and voriconazole certain medicines for seizures like carbamazepine, phenobarbital, phenotoin clarithromycin gemfibrozil nefazodone [facility]. John's Wort This list may not describe all possible interactions. Give your health care provider a list of all the medicines, herbs, non-prescription drugs, or dietary supplements you use. Also tell them if you smoke, drink alcohol, or use illegal drugs. Some items may interact with your medicine. What should I watch for while using this medication? Your condition will  be monitored carefully while you are receiving this medicine. You will need important blood work done while you are taking this medicine. This drug may make you feel generally unwell. This is not uncommon, as chemotherapy can affect healthy cells as well as cancer cells. Report any side effects. Continue your course of treatment even though you feel ill unless your doctor tells you to stop. In some cases, you may be given additional medicines to help with side effects. Follow all directions for their use. You may get drowsy or dizzy. Do not drive, use machinery, or do anything that needs mental alertness until you know how this medicine affects you. Do not stand or sit up quickly,  especially if you are an older patient. This reduces the risk of dizzy or fainting spells. Call your health care professional for advice if you get a fever, chills, or sore throat, or other symptoms of a cold or flu. Do not treat yourself. This medicine decreases your body's ability to fight infections. Try to avoid being around people who are sick. Avoid taking products that contain aspirin, acetaminophen, ibuprofen, naproxen, or ketoprofen unless instructed by your doctor. These medicines may hide a fever. This medicine may increase your risk to bruise or bleed. Call your doctor or health care professional if you notice any unusual bleeding. Be careful brushing and flossing your teeth or using a toothpick because you may get an infection or bleed more easily. If you have any dental work done, tell your dentist you are receiving this medicine. Do not become pregnant while taking this medicine or for 6 months after stopping it. Women should inform their health care professional if they wish to become pregnant or think they might be pregnant. Men should not father a child while taking this medicine and for 3 months after stopping it. There is potential for serious side effects to an unborn child. Talk to your health care professional for more information. Do not breast-feed an infant while taking this medicine or for 7 days after stopping it. This medicine has caused ovarian failure in some women. This medicine may make it more difficult to get pregnant. Talk to your health care professional if you are concerned about your fertility. This medicine has caused decreased sperm counts in some men. This may make it more difficult to father a child. Talk to your health care professional if you are concerned about your fertility. What side effects may I notice from receiving this medication? Side effects that you should report to your doctor or health care professional as soon as possible: allergic reactions like  skin rash, itching or hives, swelling of the face, lips, or tongue chest pain diarrhea flushing, runny nose, sweating during infusion low blood counts - this medicine may decrease the number of white blood cells, red blood cells and platelets. You may be at increased risk for infections and bleeding. nausea, vomiting pain, swelling, warmth in the leg signs of decreased platelets or bleeding - bruising, pinpoint red spots on the skin, black, tarry stools, blood in the urine signs of infection - fever or chills, cough, sore throat, pain or difficulty passing urine signs of decreased red blood cells - unusually weak or tired, fainting spells, lightheadedness Side effects that usually do not require medical attention (report to your doctor or health care professional if they continue or are bothersome): constipation hair loss headache loss of appetite mouth sores stomach pain This list may not describe all possible side effects. Call your doctor  for medical advice about side effects. You may report side effects to FDA at 1-800-FDA-1088. Where should I keep my medication? This drug is given in a hospital or clinic and will not be stored at home. NOTE: This sheet is a summary. It may not cover all possible information. If you have questions about this medicine, talk to your doctor, pharmacist, or health care provider.  2022 Elsevier/Gold Standard (2019-09-20 17:46:13)   Leucovorin injection What is this medication? LEUCOVORIN (loo koe VOR in) is used to prevent or treat the harmful effects of some medicines. This medicine is used to treat anemia caused by a low amount of folic acid in the body. It is also used with 5-fluorouracil (5-FU) to treat colon cancer. This medicine may be used for other purposes; ask your health care provider or pharmacist if you have questions. What should I tell my care team before I take this medication? They need to know if you have any of these  conditions: anemia from low levels of vitamin B-12 in the blood an unusual or allergic reaction to leucovorin, folic acid, other medicines, foods, dyes, or preservatives pregnant or trying to get pregnant breast-feeding How should I use this medication? This medicine is for injection into a muscle or into a vein. It is given by a health care professional in a hospital or clinic setting. Talk to your pediatrician regarding the use of this medicine in children. Special care may be needed. Overdosage: If you think you have taken too much of this medicine contact a poison control center or emergency room at once. NOTE: This medicine is only for you. Do not share this medicine with others. What if I miss a dose? This does not apply. What may interact with this medication? capecitabine fluorouracil phenobarbital phenytoin primidone trimethoprim-sulfamethoxazole This list may not describe all possible interactions. Give your health care provider a list of all the medicines, herbs, non-prescription drugs, or dietary supplements you use. Also tell them if you smoke, drink alcohol, or use illegal drugs. Some items may interact with your medicine. What should I watch for while using this medication? Your condition will be monitored carefully while you are receiving this medicine. This medicine may increase the side effects of 5-fluorouracil, 5-FU. Tell your doctor or health care professional if you have diarrhea or mouth sores that do not get better or that get worse. What side effects may I notice from receiving this medication? Side effects that you should report to your doctor or health care professional as soon as possible: allergic reactions like skin rash, itching or hives, swelling of the face, lips, or tongue breathing problems fever, infection mouth sores unusual bleeding or bruising unusually weak or tired Side effects that usually do not require medical attention (report to your doctor  or health care professional if they continue or are bothersome): constipation or diarrhea loss of appetite nausea, vomiting This list may not describe all possible side effects. Call your doctor for medical advice about side effects. You may report side effects to FDA at 1-800-FDA-1088. Where should I keep my medication? This drug is given in a hospital or clinic and will not be stored at home. NOTE: This sheet is a summary. It may not cover all possible information. If you have questions about this medicine, talk to your doctor, pharmacist, or health care provider.  2022 Elsevier/Gold Standard (2008-04-25 16:50:29)  Fluorouracil, 5-FU injection What is this medication? FLUOROURACIL, 5-FU (flure oh YOOR a sil) is a chemotherapy drug. It  slows the growth of cancer cells. This medicine is used to treat many types of cancer like breast cancer, colon or rectal cancer, pancreatic cancer, and stomach cancer. This medicine may be used for other purposes; ask your health care provider or pharmacist if you have questions. COMMON BRAND NAME(S): Adrucil What should I tell my care team before I take this medication? They need to know if you have any of these conditions: blood disorders dihydropyrimidine dehydrogenase (DPD) deficiency infection (especially a virus infection such as chickenpox, cold sores, or herpes) kidney disease liver disease malnourished, poor nutrition recent or ongoing radiation therapy an unusual or allergic reaction to fluorouracil, other chemotherapy, other medicines, foods, dyes, or preservatives pregnant or trying to get pregnant breast-feeding How should I use this medication? This drug is given as an infusion or injection into a vein. It is administered in a hospital or clinic by a specially trained health care professional. Talk to your pediatrician regarding the use of this medicine in children. Special care may be needed. Overdosage: If you think you have taken too  much of this medicine contact a poison control center or emergency room at once. NOTE: This medicine is only for you. Do not share this medicine with others. What if I miss a dose? It is important not to miss your dose. Call your doctor or health care professional if you are unable to keep an appointment. What may interact with this medication? Do not take this medicine with any of the following medications: live virus vaccines This medicine may also interact with the following medications: medicines that treat or prevent blood clots like warfarin, enoxaparin, and dalteparin This list may not describe all possible interactions. Give your health care provider a list of all the medicines, herbs, non-prescription drugs, or dietary supplements you use. Also tell them if you smoke, drink alcohol, or use illegal drugs. Some items may interact with your medicine. What should I watch for while using this medication? Visit your doctor for checks on your progress. This drug may make you feel generally unwell. This is not uncommon, as chemotherapy can affect healthy cells as well as cancer cells. Report any side effects. Continue your course of treatment even though you feel ill unless your doctor tells you to stop. In some cases, you may be given additional medicines to help with side effects. Follow all directions for their use. Call your doctor or health care professional for advice if you get a fever, chills or sore throat, or other symptoms of a cold or flu. Do not treat yourself. This drug decreases your body's ability to fight infections. Try to avoid being around people who are sick. This medicine may increase your risk to bruise or bleed. Call your doctor or health care professional if you notice any unusual bleeding. Be careful brushing and flossing your teeth or using a toothpick because you may get an infection or bleed more easily. If you have any dental work done, tell your dentist you are  receiving this medicine. Avoid taking products that contain aspirin, acetaminophen, ibuprofen, naproxen, or ketoprofen unless instructed by your doctor. These medicines may hide a fever. Do not become pregnant while taking this medicine. Women should inform their doctor if they wish to become pregnant or think they might be pregnant. There is a potential for serious side effects to an unborn child. Talk to your health care professional or pharmacist for more information. Do not breast-feed an infant while taking this medicine. Men should inform  their doctor if they wish to father a child. This medicine may lower sperm counts. Do not treat diarrhea with over the counter products. Contact your doctor if you have diarrhea that lasts more than 2 days or if it is severe and watery. This medicine can make you more sensitive to the sun. Keep out of the sun. If you cannot avoid being in the sun, wear protective clothing and use sunscreen. Do not use sun lamps or tanning beds/booths. What side effects may I notice from receiving this medication? Side effects that you should report to your doctor or health care professional as soon as possible: allergic reactions like skin rash, itching or hives, swelling of the face, lips, or tongue low blood counts - this medicine may decrease the number of white blood cells, red blood cells and platelets. You may be at increased risk for infections and bleeding. signs of infection - fever or chills, cough, sore throat, pain or difficulty passing urine signs of decreased platelets or bleeding - bruising, pinpoint red spots on the skin, black, tarry stools, blood in the urine signs of decreased red blood cells - unusually weak or tired, fainting spells, lightheadedness breathing problems changes in vision chest pain mouth sores nausea and vomiting pain, swelling, redness at site where injected pain, tingling, numbness in the hands or feet redness, swelling, or sores on  hands or feet stomach pain unusual bleeding Side effects that usually do not require medical attention (report to your doctor or health care professional if they continue or are bothersome): changes in finger or toe nails diarrhea dry or itchy skin hair loss headache loss of appetite sensitivity of eyes to the light stomach upset unusually teary eyes This list may not describe all possible side effects. Call your doctor for medical advice about side effects. You may report side effects to FDA at 1-800-FDA-1088. Where should I keep my medication? This drug is given in a hospital or clinic and will not be stored at home. NOTE: This sheet is a summary. It may not cover all possible information. If you have questions about this medicine, talk to your doctor, pharmacist, or health care provider.  2022 Elsevier/Gold Standard (2019-09-20 15:00:03)

## 2021-09-12 ENCOUNTER — Inpatient Hospital Stay: Payer: Medicaid Other

## 2021-09-13 ENCOUNTER — Inpatient Hospital Stay: Payer: Medicaid Other

## 2021-09-13 ENCOUNTER — Other Ambulatory Visit: Payer: Self-pay

## 2021-09-13 VITALS — BP 110/67 | HR 98 | Temp 98.0°F | Resp 18

## 2021-09-13 DIAGNOSIS — Z95828 Presence of other vascular implants and grafts: Secondary | ICD-10-CM

## 2021-09-13 DIAGNOSIS — Z5112 Encounter for antineoplastic immunotherapy: Secondary | ICD-10-CM | POA: Diagnosis not present

## 2021-09-13 DIAGNOSIS — C20 Malignant neoplasm of rectum: Secondary | ICD-10-CM

## 2021-09-13 MED ORDER — SODIUM CHLORIDE 0.9% FLUSH
10.0000 mL | Freq: Once | INTRAVENOUS | Status: AC
  Administered 2021-09-13: 10 mL

## 2021-09-13 MED ORDER — HEPARIN SOD (PORK) LOCK FLUSH 100 UNIT/ML IV SOLN
500.0000 [IU] | Freq: Once | INTRAVENOUS | Status: AC
  Administered 2021-09-13: 500 [IU]

## 2021-09-13 NOTE — Patient Instructions (Signed)

## 2021-09-19 ENCOUNTER — Other Ambulatory Visit: Payer: Self-pay

## 2021-09-19 ENCOUNTER — Encounter: Payer: Self-pay | Admitting: Oncology

## 2021-09-19 ENCOUNTER — Telehealth: Payer: Self-pay

## 2021-09-19 ENCOUNTER — Encounter: Payer: Self-pay | Admitting: *Deleted

## 2021-09-19 ENCOUNTER — Other Ambulatory Visit: Payer: Self-pay | Admitting: Internal Medicine

## 2021-09-19 MED ORDER — DEXAMETHASONE 4 MG PO TABS: 4.0000 mg | ORAL_TABLET | Freq: Two times a day (BID) | ORAL | 0 refills | Status: AC

## 2021-09-19 NOTE — Telephone Encounter (Signed)
Prescription sent to [facility] on [address].

## 2021-09-19 NOTE — Telephone Encounter (Signed)
Requested Prescriptions  Pending Prescriptions Disp Refills  . tamsulosin (FLOMAX) 0.4 MG CAPS capsule [Pharmacy Med Name: TAMSULOSIN HCL 0.4 MG CAPSULE] 180 capsule 1    Sig: TAKE 2 CAPSULES BY MOUTH EVERY DAY     There is no refill protocol information for this order

## 2021-09-21 ENCOUNTER — Other Ambulatory Visit (HOSPITAL_COMMUNITY): Payer: Self-pay

## 2021-09-25 ENCOUNTER — Encounter: Payer: Self-pay | Admitting: Oncology

## 2021-09-28 ENCOUNTER — Other Ambulatory Visit: Payer: Self-pay | Admitting: Oncology

## 2021-09-29 ENCOUNTER — Other Ambulatory Visit: Payer: Self-pay | Admitting: Oncology

## 2021-09-30 ENCOUNTER — Telehealth: Payer: Self-pay | Admitting: Surgery

## 2021-09-30 ENCOUNTER — Other Ambulatory Visit (HOSPITAL_COMMUNITY): Payer: Self-pay

## 2021-09-30 ENCOUNTER — Other Ambulatory Visit: Payer: Self-pay | Admitting: Surgery

## 2021-09-30 DIAGNOSIS — C20 Malignant neoplasm of rectum: Secondary | ICD-10-CM

## 2021-09-30 MED ORDER — ONDANSETRON HCL 8 MG PO TABS
8.0000 mg | ORAL_TABLET | Freq: Three times a day (TID) | ORAL | 0 refills | Status: DC | PRN
Start: 2021-09-30 — End: 2021-12-26
  Filled 2021-09-30: qty 30, 10d supply, fill #0

## 2021-09-30 MED ORDER — HYOSCYAMINE SULFATE SL 0.125 MG SL SUBL: 1.0000 | SUBLINGUAL_TABLET | Freq: Three times a day (TID) | SUBLINGUAL | 1 refills | Status: DC | PRN

## 2021-09-30 NOTE — Telephone Encounter (Signed)
Pt had left a message stating that he was having severe stomach cramps.  I called the pt, and he stated that he is taking Lomotil for the diarrhea and tums and protonix for the cramps, with minimal relief.  I notified Dr. Benay Spice of the pt's cramping, and he ordered for the pt to have Levsin .0125 mg every 8 hours as needed for stomach cramping.  I called the pt back and discussed the Levsin that was being sent to the [facility] on [address].  He verbalized understanding of how to take the medication, and was told to call our office back if he had any more concerns.

## 2021-10-01 ENCOUNTER — Inpatient Hospital Stay: Payer: Medicaid Other

## 2021-10-01 ENCOUNTER — Telehealth: Payer: Self-pay | Admitting: *Deleted

## 2021-10-01 ENCOUNTER — Inpatient Hospital Stay: Payer: Medicaid Other | Admitting: Oncology

## 2021-10-01 NOTE — Telephone Encounter (Signed)
Called Bradley Colon to f/u on "no show' today. He reports he thought he could come in, but started having the frequent small loose stool soiling his diaper. He would prefer to come in next week. Has not yet picked up the Hyoscyamine, but will do so today. Reports intermittent swelling in feet when he eats salt. Diminished salt and more water resolves the issue. Sometimes has a "heavy" feeling in his chest, but no actual shortness of breath or fever. Scheduling message sent.

## 2021-10-02 ENCOUNTER — Encounter: Payer: Self-pay | Admitting: Oncology

## 2021-10-02 ENCOUNTER — Inpatient Hospital Stay: Payer: Medicaid Other

## 2021-10-02 ENCOUNTER — Telehealth: Payer: Self-pay | Admitting: Surgery

## 2021-10-02 NOTE — Telephone Encounter (Signed)
The pt had sent several mychart messages regarding the painful cramping he is having.  He stated that the Levsin was not helping.    I notified Dr. Benay Spice, and he stated that we could give the pt a prescription for immodium, if the pt just has Lomotil for the diarrhea.  Dr. Benay Spice also stated that he could work the pt into his schedule today at 11:20.  I called the pt and told him this, and he said he was in too much pain to come in to be seen.  The pt also stated that he was not having diarrhea, but just multiple small stools and nausea.  He did not feel like the immodium would help the cramping.  I discussed the pt's concerns with Dr. Benay Spice again, and he stated that the pt needs to keep drinking plenty of fluids and take his nausea medication.  Dr. Benay Spice stated if the cramping gets any worse then the pt needs to go to the ER.  I tried to call the pt and did not get an answer, so I left a mychart message with these instructions for the pt.

## 2021-10-04 ENCOUNTER — Inpatient Hospital Stay: Payer: Medicaid Other

## 2021-10-04 ENCOUNTER — Telehealth: Payer: Self-pay | Admitting: *Deleted

## 2021-10-04 ENCOUNTER — Encounter: Payer: Self-pay | Admitting: Oncology

## 2021-10-04 NOTE — Telephone Encounter (Signed)
Called patient to f/u on his reports of severe gas pains. Reports diarrhea w/incontinence and when he takes Imodium/Lomotil it stops the diarrhea, but then he gets a lot of gas build up that is very painful. Is taking Tums and Levsin for the pain as well as heating pad on abdomen. Suggested if he is in severe pain, he needs to go to ER for evaluation--he declines this. Says he will put up with the loose stools over the weekend and premed for his MD visit to stop stools. Stressed to him that we can't help him if he does not come in and his cancer will get worse without treatment. Confirmed he has a gastroenterologist, Dr. Rush Landmark w/[facility]. Informed him it may be worthwhile to reach out to them to see if he has any thoughts on how to manage his gas pain. He agrees to consider.

## 2021-10-07 ENCOUNTER — Inpatient Hospital Stay: Payer: Medicaid Other | Attending: Oncology | Admitting: Oncology

## 2021-10-07 ENCOUNTER — Ambulatory Visit (HOSPITAL_BASED_OUTPATIENT_CLINIC_OR_DEPARTMENT_OTHER)
Admission: RE | Admit: 2021-10-07 | Discharge: 2021-10-07 | Disposition: A | Discharge status: Home / Self Care | Payer: Medicaid Other | Source: Ambulatory Visit | Attending: Oncology | Admitting: Oncology

## 2021-10-07 ENCOUNTER — Other Ambulatory Visit: Payer: Self-pay

## 2021-10-07 ENCOUNTER — Encounter: Payer: Self-pay | Admitting: Nurse Practitioner

## 2021-10-07 ENCOUNTER — Inpatient Hospital Stay: Payer: Medicaid Other

## 2021-10-07 VITALS — BP 130/84 | HR 79 | Temp 97.8°F | Resp 18 | Ht 67.0 in | Wt 191.0 lb

## 2021-10-07 DIAGNOSIS — Z95828 Presence of other vascular implants and grafts: Secondary | ICD-10-CM

## 2021-10-07 DIAGNOSIS — C20 Malignant neoplasm of rectum: Secondary | ICD-10-CM | POA: Diagnosis present

## 2021-10-07 LAB — CMP (CANCER CENTER ONLY)
ALT: 16 U/L (ref 0–44)
AST: 16 U/L (ref 15–41)
Albumin: 3.6 g/dL (ref 3.5–5.0)
Alkaline Phosphatase: 76 U/L (ref 38–126)
Anion gap: 7 (ref 5–15)
BUN: 14 mg/dL (ref 6–20)
CO2: 26 mmol/L (ref 22–32)
Calcium: 9.2 mg/dL (ref 8.9–10.3)
Chloride: 103 mmol/L (ref 98–111)
Creatinine: 0.81 mg/dL (ref 0.61–1.24)
GFR, Estimated: >60 mL/min (ref >60)
Glucose, Bld: 94 mg/dL (ref 70–99)
Potassium: 4 mmol/L (ref 3.5–5.1)
Sodium: 136 mmol/L (ref 135–145)
Total Bilirubin: 0.5 mg/dL (ref 0.3–1.2)
Total Protein: 6.2 g/dL — ABNORMAL LOW (ref 6.5–8.1)

## 2021-10-07 LAB — CBC WITH DIFFERENTIAL (CANCER CENTER ONLY)
Abs Immature Granulocytes: 0.09 10*3/uL — ABNORMAL HIGH (ref 0.00–0.07)
Basophils Absolute: 0 10*3/uL (ref 0.0–0.1)
Basophils Relative: 0 %
Eosinophils Absolute: 0 10*3/uL (ref 0.0–0.5)
Eosinophils Relative: 0 %
HCT: 35.5 % — ABNORMAL LOW (ref 39.0–52.0)
Hemoglobin: 11.4 g/dL — ABNORMAL LOW (ref 13.0–17.0)
Immature Granulocytes: 1 %
Lymphocytes Relative: 24 %
Lymphs Abs: 2.2 10*3/uL (ref 0.7–4.0)
MCH: 27.7 pg (ref 26.0–34.0)
MCHC: 32.1 g/dL (ref 30.0–36.0)
MCV: 86.2 fL (ref 80.0–100.0)
Monocytes Absolute: 1 10*3/uL (ref 0.1–1.0)
Monocytes Relative: 11 %
Neutro Abs: 5.9 10*3/uL (ref 1.7–7.7)
Neutrophils Relative %: 64 %
Platelet Count: 477 10*3/uL — ABNORMAL HIGH (ref 150–400)
RBC: 4.12 MIL/uL — ABNORMAL LOW (ref 4.22–5.81)
RDW: 15.7 % — ABNORMAL HIGH (ref 11.5–15.5)
WBC Count: 9.2 10*3/uL (ref 4.0–10.5)
nRBC: 0 % (ref 0.0–0.2)

## 2021-10-07 LAB — CEA (ACCESS): CEA (CHCC): 37.59 ng/mL — ABNORMAL HIGH (ref 0.00–5.00)

## 2021-10-07 LAB — MAGNESIUM: Magnesium: 1.6 mg/dL — ABNORMAL LOW (ref 1.7–2.4)

## 2021-10-07 MED ORDER — SODIUM CHLORIDE 0.9% FLUSH
10.0000 mL | Freq: Once | INTRAVENOUS | Status: AC
  Administered 2021-10-07: 10 mL

## 2021-10-07 MED ORDER — HEPARIN SOD (PORK) LOCK FLUSH 100 UNIT/ML IV SOLN: 250.0000 [IU] | Freq: Once | INTRAVENOUS | Status: DC

## 2021-10-07 MED ORDER — HEPARIN SOD (PORK) LOCK FLUSH 100 UNIT/ML IV SOLN
500.0000 [IU] | Freq: Once | INTRAVENOUS | Status: AC
  Administered 2021-10-07: 500 [IU]

## 2021-10-07 NOTE — Progress Notes (Signed)
[facility] OFFICE PROGRESS NOTE   Diagnosis: Rectal cancer  INTERVAL HISTORY:   Bradley Colon completed another cycle of FOLFIRI/panitumumab on 09/11/2021.  He reports nausea beginning on day 3.  He has frequent bowel movements and intermittent abdominal cramps.  Levsin helps the cramps partially.  Lomotil relieve the diarrhea.  He does not have diarrhea at present.  He is having bowel movements.  He has intermittent episodes of nausea and vomiting.  Bradley Colon has dry skin with desquamation at the feet.  Rectal and perineal pain remains improved with MS Contin and oxycodone.  Objective:  Vital signs in last 24 hours:  Blood pressure 130/84, pulse 79, temperature 97.8 F (36.6 C), temperature source Oral, resp. rate 18, height '5\' 7"'  (1.702 m), weight 191 lb (86.6 kg), SpO2 100 %.    HEENT: No thrush or ulcers Resp: Lungs clear bilaterally Cardio: Regular rate and rhythm GI: No hepatomegaly, mildly distended, hyperactive bowel sounds, nontender Vascular: Trace lower pretibial edema bilaterally  Skin: Dryness with hyperpigmentation at the lower legs and feet, superficial dry desquamation at the soles, dryness of the hands, few areas of paronychia, single pustule at the outer left upper lip  Portacath/PICC-without erythema  Lab Results:  Lab Results  Component Value Date   WBC 9.2 10/07/2021   HGB 11.4 (L) 10/07/2021   HCT 35.5 (L) 10/07/2021   MCV 86.2 10/07/2021   PLT 477 (H) 10/07/2021   NEUTROABS 5.9 10/07/2021    CMP  Lab Results  Component Value Date   NA 136 10/07/2021   K 4.0 10/07/2021   CL 103 10/07/2021   CO2 26 10/07/2021   GLUCOSE 94 10/07/2021   BUN 14 10/07/2021   CREATININE 0.81 10/07/2021   CALCIUM 9.2 10/07/2021   PROT 6.2 (L) 10/07/2021   ALBUMIN 3.6 10/07/2021   AST 16 10/07/2021   ALT 16 10/07/2021   ALKPHOS 76 10/07/2021   BILITOT 0.5 10/07/2021   GFRNONAA >60 10/07/2021   GFRAA >60 01/02/2020    Lab Results  Component Value  Date   CEA1 59.33 (H) 08/12/2021   CEA 37.59 (H) 10/07/2021   Medications: I have reviewed the patient's current medications.   Assessment/Plan:  Rectal cancer, clinical stage III (T3cN2), Foundation 1 on lymph node 11/18/2019-tumor mutation burden 1, FG R1 amplification, K-ras wild-type, microsatellite status could not be determined Rectal mass extending from 4-9 cm on colonoscopy 02/15/2019 with a biopsy confirming invasive adenocarcinoma CTs 02/23/2019- thickening of the low rectum without a discrete mass, small bilateral pulmonary nodules, some present on imaging from 2014 Pelvic MRI 02/25/2019- T3cN2 tumor beginning at 4.9 cm from the anal verge and 0 cm from the internal anal sphincter, tumor extends through the muscularis propria, 2.  Rectal lymph nodes and 2 enlarged right internal iliac nodes CT[facility] at [facility] 04/19/2019- new subcentimeter right hepatic lesion concerning for metastasis, stable low rectal mass with prominent mesorectal lymph nodes, stable pathologic enlarged right internal iliac nodes, stable pulmonary nodules Xeloda/radiation beginning 04/26/2019, completed 07/05/2019 CT 07/26/2019-growth of previously described right hepatic lesion, slight decrease in pelvic lymphadenopathy and rectal thickening 08/05/2019-week 1 bolus 5-FU/leucovorin (200 mg/m) 08/12/2019-week to bolus 5-FU/leucovorin (400 mg/m) 10/16/ 2020-week 3 bolus 5-FU/leucovorin (400 mg/m) and oxaliplatin (42.5 mg/m) CT chest 10/04/2019-slight increase in bilateral pulmonary nodules concerning for metastases MRI abdomen 10/06/2019-right hepatic lesion consistent with metastasis, wall thickening of the low anterior rectum, no new or enlarging adenopathy, stable mesorectal and right internal iliac nodes PET scan 10/14/2019-pulmonary nodules below PET resolution,  hypermetabolic right posterior hepatic lesion, mildly hypermetabolic partially calcified right internal iliac chain lymphadenopathy, mild gastric hypermetabolism and  thickening-likely gastritis 11/18/2019-LAR with diverting loop ileostomy and ablation of solitary liver metastasis (2 cm segment 7), moderately differentiated adenocarcinoma invading into soft tissue, LVI present, perineural invasion present, 5/15 lymph nodes positive, 4 tumor deposits, absent treatment effect-score 3,ypT3 ypN2a, mismatch repair protein expression intact, MSS CT chest 01/16/2020-increase in the size of a dominant right lower lobe nodule, unchanged left upper lobe and left lower lobe nodules CT chest 04/11/2020-enlargement of bilateral lung nodules, and new nodules CT chest 09/11/2020-enlargement of bilateral lung nodules, aggressive medial right liver lesion Cycle 1 FOLFOX 10/31/2020 Cycle 2 FOLFOX 11/14/2020 Cycle 3 FOLFOX 11/29/2020 CT chest 01/03/2021-multiple bilateral lung nodules, stable to slightly decreased in size, no new lesions, hepatic dome lesion is smaller (ablated lesion?) Cycle 4 FOLFOX 01/21/2021 CTs 07/02/2021-increase in size and number of pulmonary metastases, new mediastinal lymph node, increased right hepatic lobe metastasis Targeted bronchoscopy of right lower lobe lesion 07/18/2021-brushing revealed malignant cells consistent with adenocarcinoma Cycle 1 FOLFIRI/panitumumab 08/12/2021 Cycle 2 FOLFIRI/panitumumab 09/11/2021 Pain secondary to #1, improved Reactive airways disease/asthma Report of multiple allergens Port-A-Cath related DVT, right IJ, 09/30/2019-apixaban, converted to Lovenox surrounding surgery January 2021, changed back to apixaban 12/30/2019 Erectile dysfunction following treatment for rectal cancer-followed by urology Rectal/perineal pain following the low anterior resection-persistent     Disposition: Bradley Colon has completed 2 cycles of FOLFIRI/panitumumab.  His weight and the CEA are stable.  Chemotherapy has been spaced out beyond a standard schedule.  Bradley Colon wishes to move tomorrow's chemotherapy out for a few weeks.  He has been evicted from his  apartment and is looking for a new living arrangement.  I stressed the importance of proceeding with chemotherapy on schedule.  He agrees to schedule the next cycle of chemotherapy for 10/17/2021.  He will return for an office visit and chemotherapy on 11/07/2020.  We will check the CEA when he is here on 11/07/2020.  The abdomen is mildly distended today.  I have a low suspicion for a bowel obstruction, but we will obtain an x-ray to look for evidence of an ileus.    Betsy Coder, MD  10/07/2021  3:43 PM

## 2021-10-08 ENCOUNTER — Inpatient Hospital Stay: Payer: Medicaid Other

## 2021-10-09 ENCOUNTER — Other Ambulatory Visit: Payer: Self-pay | Admitting: Internal Medicine

## 2021-10-09 ENCOUNTER — Telehealth: Payer: Self-pay | Admitting: *Deleted

## 2021-10-09 ENCOUNTER — Other Ambulatory Visit (HOSPITAL_COMMUNITY): Payer: Self-pay

## 2021-10-09 DIAGNOSIS — K56609 Unspecified intestinal obstruction, unspecified as to partial versus complete obstruction: Secondary | ICD-10-CM

## 2021-10-09 NOTE — Telephone Encounter (Signed)
Called patient to provide appointment time for his CT scan tomorrow. He was running to toilet and asked RN to put the appointment in [place].

## 2021-10-09 NOTE — Telephone Encounter (Signed)
Notified patient that his abdominal xray is suggestive of a small bowel obstruction. He needs to remain on clear liquids. Asking if he is having any N/V or abdominal pain. Reports no N/V, but does have gassy, bloated abdominal pain when he gets diarrhea under control. Comfortable if he is having multiple loose stools. MD suggests CT w/oral contrast to assess. Patient flatly refuses any IV or oral contrast due to "severe reaction". Attempted to explain the oral contrast does not have iodine and you will not react to this one, but he continues to refuse this or the water based oral contrast. MD aware. Ordered CT without oral/IV contrast. Managed care made aware of need for PA asap.

## 2021-10-10 ENCOUNTER — Other Ambulatory Visit: Payer: Self-pay | Admitting: Internal Medicine

## 2021-10-10 ENCOUNTER — Other Ambulatory Visit (HOSPITAL_COMMUNITY): Payer: Self-pay

## 2021-10-10 ENCOUNTER — Encounter: Payer: Self-pay | Admitting: *Deleted

## 2021-10-10 ENCOUNTER — Ambulatory Visit (HOSPITAL_BASED_OUTPATIENT_CLINIC_OR_DEPARTMENT_OTHER)
Admission: RE | Admit: 2021-10-10 | Discharge: 2021-10-10 | Disposition: A | Discharge status: Home / Self Care | Payer: Medicaid Other | Source: Ambulatory Visit | Attending: Oncology | Admitting: Oncology

## 2021-10-10 ENCOUNTER — Other Ambulatory Visit: Payer: Self-pay

## 2021-10-10 ENCOUNTER — Inpatient Hospital Stay: Payer: Medicaid Other

## 2021-10-10 DIAGNOSIS — K56609 Unspecified intestinal obstruction, unspecified as to partial versus complete obstruction: Secondary | ICD-10-CM | POA: Insufficient documentation

## 2021-10-10 NOTE — Telephone Encounter (Signed)
Called [facility] pharmacist and reviewed Bradley Colon , DO not at [facility] to refill prescriptions. Pharmacist reports he will resubmit request to Palliative.

## 2021-10-10 NOTE — Telephone Encounter (Signed)
Requested medication (s) are due for refill today: see  note   Requested medication (s) are on the active medication list: yes   Last refill:  ms contin- 09/10/21 #60 0 refills, oxycodone - 09/10/21 #90 0 refills  Future visit scheduled: na  Notes to clinic:  request sent x 2. Last ordered by Douglass Rivers, DO. Do you want to refill Rx? Contacted pharmacy to notify provider not at this practice . Please advise      Requested Prescriptions  Pending Prescriptions Disp Refills   morphine (MS CONTIN) 60 MG 12 hr tablet 60 tablet 0    Sig: Take 1 tablet by mouth every 12 hours.     There is no refill protocol information for this order     Oxycodone HCl 20 MG TABS 90 tablet 0    Sig: Take 1 tablet by mouth 3 times daily as needed for severe pain.     There is no refill protocol information for this order

## 2021-10-11 ENCOUNTER — Other Ambulatory Visit: Payer: Self-pay | Admitting: Internal Medicine

## 2021-10-11 ENCOUNTER — Encounter: Payer: Self-pay | Admitting: Oncology

## 2021-10-11 ENCOUNTER — Telehealth: Payer: Self-pay | Admitting: *Deleted

## 2021-10-11 ENCOUNTER — Other Ambulatory Visit (HOSPITAL_COMMUNITY): Payer: Self-pay

## 2021-10-11 DIAGNOSIS — Z515 Encounter for palliative care: Secondary | ICD-10-CM | POA: Insufficient documentation

## 2021-10-11 MED ORDER — OXYCODONE HCL 20 MG PO TABS
20.0000 mg | ORAL_TABLET | Freq: Three times a day (TID) | ORAL | 0 refills | Status: DC | PRN
  Filled 2021-10-11: qty 90, 30d supply, fill #0

## 2021-10-11 MED ORDER — MORPHINE SULFATE ER 60 MG PO TBCR
60.0000 mg | EXTENDED_RELEASE_TABLET | Freq: Two times a day (BID) | ORAL | 0 refills | Status: DC
  Filled 2021-10-11: qty 60, 30d supply, fill #0

## 2021-10-11 NOTE — Progress Notes (Signed)
Refills approved for MS Contin and Oxycodone for cancer related pain.

## 2021-10-11 NOTE — Telephone Encounter (Signed)
Dr. Benay Spice spoke with patient and advised him to go to [facility] ER now. He has declined and will not go until tomorrow. Was instructed to stay on liquid diet and go to ER if he starts vomiting.  Called [facility] triage line and requested PA notified that he will not be arriving until 12/10.

## 2021-10-11 NOTE — Telephone Encounter (Signed)
Dr. Benay Spice spoke w/surgical team and was told to have Bradley Colon come to the [facility] ER now. Notified Bradley Colon of the CT results and need to see [place] surgeon for surgery. He is asking multiple questions as to surgery and if he can wait a day to go. Will have MD call him and go over everything. Reports he needs to find transportation and feed his animals.Told him to stay on liquids only today. Notified radiology to push over CT images to [facility] to view.

## 2021-10-11 NOTE — Telephone Encounter (Addendum)
Spoke w/triage nurse requesting call from Dr. Hester Mates or his team to call Dr. Benay Spice asap to discuss findings on CT scan from last night. Patient has SBO related to vertral hernia. Provided direct number for MD to call office. Dr. Benay Spice attempted to reach patient via phone last night without success.

## 2021-10-12 ENCOUNTER — Other Ambulatory Visit (HOSPITAL_COMMUNITY): Payer: Self-pay

## 2021-10-15 ENCOUNTER — Telehealth: Payer: Self-pay | Admitting: *Deleted

## 2021-10-15 DIAGNOSIS — K56609 Unspecified intestinal obstruction, unspecified as to partial versus complete obstruction: Secondary | ICD-10-CM | POA: Insufficient documentation

## 2021-10-15 NOTE — Telephone Encounter (Signed)
Noted that patient is being discharged from [facility] today without having the surgery. Obstruction relieved w/NG tube and liquid diet only. Left message to inquire if he wants to proceed w/chemo tomorrow as scheduled. Please call back or send a MyChart message.

## 2021-10-16 ENCOUNTER — Inpatient Hospital Stay: Payer: Medicaid Other

## 2021-10-18 ENCOUNTER — Inpatient Hospital Stay: Payer: Medicaid Other

## 2021-10-21 ENCOUNTER — Encounter: Payer: Self-pay | Admitting: Oncology

## 2021-10-22 ENCOUNTER — Telehealth: Payer: Self-pay | Admitting: *Deleted

## 2021-10-22 DIAGNOSIS — R300 Dysuria: Secondary | ICD-10-CM

## 2021-10-22 NOTE — Telephone Encounter (Addendum)
Dr. Benay Spice wants patient to have UA and culture. Order placed and left VM for patient to come in for collection and to please call with time he will be here to add in the appointment. Called patient back and he agrees to come tomorrow at 10 am for urine sample.

## 2021-10-22 NOTE — Telephone Encounter (Signed)
Called patient in response to MyChart message re: urinary urgency. He reports over last few days sensation of urinary urgency and difficulty w/stream and moments later will have sudden strong urine output to point of incontinence. Reports some burning at initiation of stream. Urine looks clear w/no odor. No fever. He is on tamsulosin daily per Dr. Hilma Favors. Symptoms tend to be worse at night. States he thinks he could come in to provide a urine sample if MD needs this.

## 2021-10-23 ENCOUNTER — Encounter: Payer: Self-pay | Admitting: *Deleted

## 2021-10-23 ENCOUNTER — Inpatient Hospital Stay: Payer: Medicaid Other

## 2021-10-23 ENCOUNTER — Other Ambulatory Visit: Payer: Self-pay

## 2021-10-23 DIAGNOSIS — C20 Malignant neoplasm of rectum: Secondary | ICD-10-CM | POA: Insufficient documentation

## 2021-10-23 DIAGNOSIS — C787 Secondary malignant neoplasm of liver and intrahepatic bile duct: Secondary | ICD-10-CM | POA: Diagnosis not present

## 2021-10-23 DIAGNOSIS — R300 Dysuria: Secondary | ICD-10-CM

## 2021-10-23 LAB — URINALYSIS, COMPLETE (UACMP) WITH MICROSCOPIC
Bilirubin Urine: NEGATIVE
Glucose, UA: NEGATIVE mg/dL
Hgb urine dipstick: NEGATIVE
Ketones, ur: NEGATIVE mg/dL
Leukocytes,Ua: NEGATIVE
Nitrite: NEGATIVE
Protein, ur: NEGATIVE mg/dL
Specific Gravity, Urine: 1.01 (ref 1.005–1.030)
pH: 8 (ref 5.0–8.0)

## 2021-10-24 LAB — URINE CULTURE: Culture: NO GROWTH

## 2021-11-06 ENCOUNTER — Inpatient Hospital Stay: Payer: Medicaid Other

## 2021-11-06 ENCOUNTER — Inpatient Hospital Stay: Payer: Medicaid Other | Admitting: Oncology

## 2021-11-06 ENCOUNTER — Telehealth: Payer: Self-pay | Admitting: *Deleted

## 2021-11-06 NOTE — Telephone Encounter (Signed)
Called patient to follow up on "no show" today.He reports he is very uncomfortable to persistent urinary urgency, then difficulty voiding and intermittent incontinence. Also reports pain in scrotum and penis. He is also having difficulty with having a BM even with Movantik and Colace daily. Suggested he increase the Colace to 3/day and continue Movantik. Provided him with phone # to reach his urologist at [facility], Dr. Rhodia Albright to discuss the urinary issues. He is still struggling with housing and hopes to have this worked out by 11/11/21. Currently staying with his mother and three children, but needs to move soon due to stress on his mom. He thinks he could be ready by 1/11 to see Dr. Benay Spice and have treatment. Scheduling message sent.

## 2021-11-07 ENCOUNTER — Telehealth: Payer: Self-pay | Admitting: Oncology

## 2021-11-07 NOTE — Telephone Encounter (Signed)
Called patient per :   Scheduling Message Entered by Tania Ade on 11/06/2021 at 12:49 PM Priority: Routine <No visit type provided>  Department: [facility]  Provider:   Scheduling Notes:  Schedule for lab/flush/OV/FOLFOX on 11/13/21 please   At the moment patient did not want to schedule Folfox treatment. Patient only wanted to schedule lab and follow up appointment with Dr. Benay Spice. RN Manuela Schwartz and Dr. Benay Spice made aware.    Patient aware of appointment date and time for follow up.

## 2021-11-08 ENCOUNTER — Other Ambulatory Visit: Payer: Self-pay | Admitting: Internal Medicine

## 2021-11-08 ENCOUNTER — Inpatient Hospital Stay: Payer: Medicaid Other

## 2021-11-08 NOTE — Telephone Encounter (Signed)
°  Notes to clinic PCP is not a provider in this practice.  Requested Prescriptions  Pending Prescriptions Disp Refills   morphine (MS CONTIN) 60 MG 12 hr tablet 60 tablet 0    Sig: Take 1 tablet by mouth every 12 hours.     There is no refill protocol information for this order     Oxycodone HCl 20 MG TABS 90 tablet 0    Sig: Take 1 tablet by mouth 3 times daily as needed for severe pain.     There is no refill protocol information for this order

## 2021-11-09 ENCOUNTER — Other Ambulatory Visit (HOSPITAL_COMMUNITY): Payer: Self-pay

## 2021-11-11 ENCOUNTER — Other Ambulatory Visit: Payer: Self-pay | Admitting: Nurse Practitioner

## 2021-11-11 ENCOUNTER — Other Ambulatory Visit: Payer: Self-pay | Admitting: *Deleted

## 2021-11-11 ENCOUNTER — Other Ambulatory Visit: Payer: Self-pay | Admitting: Internal Medicine

## 2021-11-11 ENCOUNTER — Other Ambulatory Visit (HOSPITAL_COMMUNITY): Payer: Self-pay

## 2021-11-11 MED ORDER — MORPHINE SULFATE ER 60 MG PO TBCR
60.0000 mg | EXTENDED_RELEASE_TABLET | Freq: Two times a day (BID) | ORAL | 0 refills | Status: DC
  Filled 2021-11-11: qty 60, 30d supply, fill #0

## 2021-11-11 MED ORDER — OXYCODONE HCL 20 MG PO TABS
20.0000 mg | ORAL_TABLET | Freq: Three times a day (TID) | ORAL | 0 refills | Status: DC | PRN
  Filled 2021-11-11: qty 9, 3d supply, fill #0
  Filled 2021-11-11: qty 81, 27d supply, fill #0

## 2021-11-11 NOTE — Progress Notes (Signed)
Patient requesting refill on Anusol HC suppository. Has a hemorrhoid again. Sent message to patient that we can work on refill tomorrow when he sees Dr. Benay Spice.

## 2021-11-11 NOTE — Telephone Encounter (Signed)
Patient's Oxycodone and Morphine ER have been authorized via Cover My Meds. Approval PA Case: [id number] 10/2021-01/09/2022.

## 2021-11-12 ENCOUNTER — Inpatient Hospital Stay: Payer: Medicaid Other | Admitting: Oncology

## 2021-11-12 ENCOUNTER — Encounter: Payer: Self-pay | Admitting: Oncology

## 2021-11-12 ENCOUNTER — Inpatient Hospital Stay: Payer: Medicaid Other

## 2021-11-12 ENCOUNTER — Other Ambulatory Visit (HOSPITAL_COMMUNITY): Payer: Self-pay

## 2021-11-20 ENCOUNTER — Encounter: Payer: Self-pay | Admitting: Oncology

## 2021-11-21 ENCOUNTER — Encounter: Payer: Self-pay | Admitting: Oncology

## 2021-11-27 DIAGNOSIS — R32 Unspecified urinary incontinence: Secondary | ICD-10-CM | POA: Insufficient documentation

## 2021-12-01 ENCOUNTER — Encounter: Payer: Self-pay | Admitting: Oncology

## 2021-12-02 ENCOUNTER — Telehealth: Payer: Self-pay | Admitting: *Deleted

## 2021-12-02 NOTE — Telephone Encounter (Signed)
Patient left VM asking for appointment for lab/OV to get started on treatment again. Report having trouble going to bathroom. He does self-cath and this works, but asking how long that will continue? Reports BLE edema and twitching in his hands. High priority scheduling message sent to get him in 1st available for lab/flush and OV.

## 2021-12-03 ENCOUNTER — Emergency Department (HOSPITAL_COMMUNITY): Payer: Medicaid Other

## 2021-12-03 ENCOUNTER — Inpatient Hospital Stay (HOSPITAL_BASED_OUTPATIENT_CLINIC_OR_DEPARTMENT_OTHER): Payer: Medicaid Other | Admitting: Oncology

## 2021-12-03 ENCOUNTER — Inpatient Hospital Stay: Payer: Medicaid Other

## 2021-12-03 ENCOUNTER — Inpatient Hospital Stay (HOSPITAL_COMMUNITY)
Admission: EM | Admit: 2021-12-03 | Discharge: 2021-12-08 | DRG: 698 | Discharge status: Against Medical Advice | Payer: Medicaid Other | Attending: Internal Medicine | Admitting: Internal Medicine

## 2021-12-03 ENCOUNTER — Other Ambulatory Visit: Payer: Self-pay

## 2021-12-03 ENCOUNTER — Encounter: Payer: Self-pay | Admitting: Oncology

## 2021-12-03 VITALS — BP 168/85 | HR 100 | Temp 98.1°F | Resp 20 | Ht 67.0 in | Wt 193.4 lb

## 2021-12-03 DIAGNOSIS — G4089 Other seizures: Secondary | ICD-10-CM | POA: Diagnosis present

## 2021-12-03 DIAGNOSIS — Z825 Family history of asthma and other chronic lower respiratory diseases: Secondary | ICD-10-CM

## 2021-12-03 DIAGNOSIS — Z803 Family history of malignant neoplasm of breast: Secondary | ICD-10-CM

## 2021-12-03 DIAGNOSIS — G9341 Metabolic encephalopathy: Secondary | ICD-10-CM | POA: Diagnosis present

## 2021-12-03 DIAGNOSIS — Z79899 Other long term (current) drug therapy: Secondary | ICD-10-CM

## 2021-12-03 DIAGNOSIS — B9689 Other specified bacterial agents as the cause of diseases classified elsewhere: Secondary | ICD-10-CM | POA: Diagnosis present

## 2021-12-03 DIAGNOSIS — R6520 Severe sepsis without septic shock: Secondary | ICD-10-CM | POA: Diagnosis present

## 2021-12-03 DIAGNOSIS — C20 Malignant neoplasm of rectum: Secondary | ICD-10-CM | POA: Diagnosis not present

## 2021-12-03 DIAGNOSIS — N170 Acute kidney failure with tubular necrosis: Secondary | ICD-10-CM | POA: Diagnosis present

## 2021-12-03 DIAGNOSIS — Z85048 Personal history of other malignant neoplasm of rectum, rectosigmoid junction, and anus: Secondary | ICD-10-CM

## 2021-12-03 DIAGNOSIS — E871 Hypo-osmolality and hyponatremia: Secondary | ICD-10-CM | POA: Diagnosis present

## 2021-12-03 DIAGNOSIS — C7802 Secondary malignant neoplasm of left lung: Secondary | ICD-10-CM | POA: Diagnosis present

## 2021-12-03 DIAGNOSIS — N179 Acute kidney failure, unspecified: Secondary | ICD-10-CM

## 2021-12-03 DIAGNOSIS — Y842 Radiological procedure and radiotherapy as the cause of abnormal reaction of the patient, or of later complication, without mention of misadventure at the time of the procedure: Secondary | ICD-10-CM | POA: Diagnosis present

## 2021-12-03 DIAGNOSIS — R569 Unspecified convulsions: Secondary | ICD-10-CM

## 2021-12-03 DIAGNOSIS — A498 Other bacterial infections of unspecified site: Secondary | ICD-10-CM

## 2021-12-03 DIAGNOSIS — Z91199 Patient's noncompliance with other medical treatment and regimen due to unspecified reason: Secondary | ICD-10-CM

## 2021-12-03 DIAGNOSIS — Y846 Urinary catheterization as the cause of abnormal reaction of the patient, or of later complication, without mention of misadventure at the time of the procedure: Secondary | ICD-10-CM | POA: Diagnosis present

## 2021-12-03 DIAGNOSIS — C7801 Secondary malignant neoplasm of right lung: Secondary | ICD-10-CM | POA: Diagnosis present

## 2021-12-03 DIAGNOSIS — Z801 Family history of malignant neoplasm of trachea, bronchus and lung: Secondary | ICD-10-CM

## 2021-12-03 DIAGNOSIS — J45909 Unspecified asthma, uncomplicated: Secondary | ICD-10-CM | POA: Diagnosis present

## 2021-12-03 DIAGNOSIS — N39498 Other specified urinary incontinence: Secondary | ICD-10-CM | POA: Diagnosis present

## 2021-12-03 DIAGNOSIS — Z87442 Personal history of urinary calculi: Secondary | ICD-10-CM

## 2021-12-03 DIAGNOSIS — Z79891 Long term (current) use of opiate analgesic: Secondary | ICD-10-CM

## 2021-12-03 DIAGNOSIS — G8929 Other chronic pain: Secondary | ICD-10-CM | POA: Diagnosis present

## 2021-12-03 DIAGNOSIS — Z5329 Procedure and treatment not carried out because of patient's decision for other reasons: Secondary | ICD-10-CM | POA: Diagnosis present

## 2021-12-03 DIAGNOSIS — E875 Hyperkalemia: Secondary | ICD-10-CM | POA: Diagnosis present

## 2021-12-03 DIAGNOSIS — C787 Secondary malignant neoplasm of liver and intrahepatic bile duct: Secondary | ICD-10-CM | POA: Diagnosis present

## 2021-12-03 DIAGNOSIS — R4182 Altered mental status, unspecified: Secondary | ICD-10-CM

## 2021-12-03 DIAGNOSIS — Z95828 Presence of other vascular implants and grafts: Secondary | ICD-10-CM

## 2021-12-03 DIAGNOSIS — Z881 Allergy status to other antibiotic agents status: Secondary | ICD-10-CM

## 2021-12-03 DIAGNOSIS — A419 Sepsis, unspecified organism: Secondary | ICD-10-CM | POA: Diagnosis present

## 2021-12-03 DIAGNOSIS — Z833 Family history of diabetes mellitus: Secondary | ICD-10-CM

## 2021-12-03 DIAGNOSIS — Z91041 Radiographic dye allergy status: Secondary | ICD-10-CM

## 2021-12-03 DIAGNOSIS — D630 Anemia in neoplastic disease: Secondary | ICD-10-CM | POA: Diagnosis present

## 2021-12-03 DIAGNOSIS — N39490 Overflow incontinence: Secondary | ICD-10-CM | POA: Diagnosis present

## 2021-12-03 DIAGNOSIS — E872 Acidosis, unspecified: Secondary | ICD-10-CM | POA: Diagnosis present

## 2021-12-03 DIAGNOSIS — N10 Acute tubulo-interstitial nephritis: Secondary | ICD-10-CM | POA: Diagnosis present

## 2021-12-03 DIAGNOSIS — Z923 Personal history of irradiation: Secondary | ICD-10-CM

## 2021-12-03 DIAGNOSIS — G40409 Other generalized epilepsy and epileptic syndromes, not intractable, without status epilepticus: Secondary | ICD-10-CM | POA: Diagnosis present

## 2021-12-03 DIAGNOSIS — Z8249 Family history of ischemic heart disease and other diseases of the circulatory system: Secondary | ICD-10-CM

## 2021-12-03 DIAGNOSIS — Z1629 Resistance to other single specified antibiotic: Secondary | ICD-10-CM | POA: Diagnosis present

## 2021-12-03 DIAGNOSIS — Z87891 Personal history of nicotine dependence: Secondary | ICD-10-CM

## 2021-12-03 DIAGNOSIS — M79661 Pain in right lower leg: Secondary | ICD-10-CM | POA: Diagnosis present

## 2021-12-03 DIAGNOSIS — E162 Hypoglycemia, unspecified: Secondary | ICD-10-CM | POA: Diagnosis present

## 2021-12-03 DIAGNOSIS — Z91040 Latex allergy status: Secondary | ICD-10-CM

## 2021-12-03 DIAGNOSIS — R7881 Bacteremia: Secondary | ICD-10-CM | POA: Diagnosis present

## 2021-12-03 DIAGNOSIS — T83511A Infection and inflammatory reaction due to indwelling urethral catheter, initial encounter: Principal | ICD-10-CM | POA: Diagnosis present

## 2021-12-03 DIAGNOSIS — Z20822 Contact with and (suspected) exposure to covid-19: Secondary | ICD-10-CM | POA: Diagnosis present

## 2021-12-03 DIAGNOSIS — N138 Other obstructive and reflux uropathy: Secondary | ICD-10-CM | POA: Diagnosis present

## 2021-12-03 LAB — I-STAT CHEM 8, ED
BUN: 92 mg/dL — ABNORMAL HIGH (ref 6–20)
Calcium, Ion: 1.15 mmol/L (ref 1.15–1.40)
Chloride: 104 mmol/L (ref 98–111)
Creatinine, Ser: 14.9 mg/dL — ABNORMAL HIGH (ref 0.61–1.24)
Glucose, Bld: 78 mg/dL (ref 70–99)
HCT: 32 % — ABNORMAL LOW (ref 39.0–52.0)
Hemoglobin: 10.9 g/dL — ABNORMAL LOW (ref 13.0–17.0)
Potassium: 7.7 mmol/L (ref 3.5–5.1)
Sodium: 130 mmol/L — ABNORMAL LOW (ref 135–145)
TCO2: 20 mmol/L — ABNORMAL LOW (ref 22–32)

## 2021-12-03 LAB — CBC WITH DIFFERENTIAL/PLATELET
Abs Immature Granulocytes: 0.09 10*3/uL — ABNORMAL HIGH (ref 0.00–0.07)
Basophils Absolute: 0 10*3/uL (ref 0.0–0.1)
Basophils Relative: 0 %
Eosinophils Absolute: 0.1 10*3/uL (ref 0.0–0.5)
Eosinophils Relative: 1 %
HCT: 30.6 % — ABNORMAL LOW (ref 39.0–52.0)
Hemoglobin: 9.8 g/dL — ABNORMAL LOW (ref 13.0–17.0)
Immature Granulocytes: 1 %
Lymphocytes Relative: 3 %
Lymphs Abs: 0.4 10*3/uL — ABNORMAL LOW (ref 0.7–4.0)
MCH: 27.3 pg (ref 26.0–34.0)
MCHC: 32 g/dL (ref 30.0–36.0)
MCV: 85.2 fL (ref 80.0–100.0)
Monocytes Absolute: 0.2 10*3/uL (ref 0.1–1.0)
Monocytes Relative: 2 %
Neutro Abs: 11.5 10*3/uL — ABNORMAL HIGH (ref 1.7–7.7)
Neutrophils Relative %: 93 %
Platelets: 558 10*3/uL — ABNORMAL HIGH (ref 150–400)
RBC: 3.59 MIL/uL — ABNORMAL LOW (ref 4.22–5.81)
RDW: 15.4 % (ref 11.5–15.5)
WBC: 12.4 10*3/uL — ABNORMAL HIGH (ref 4.0–10.5)
nRBC: 0 % (ref 0.0–0.2)

## 2021-12-03 LAB — I-STAT ARTERIAL BLOOD GAS, ED
Acid-base deficit: 8 mmol/L — ABNORMAL HIGH (ref 0.0–2.0)
Bicarbonate: 16.3 mmol/L — ABNORMAL LOW (ref 20.0–28.0)
Calcium, Ion: 1.2 mmol/L (ref 1.15–1.40)
HCT: 32 % — ABNORMAL LOW (ref 39.0–52.0)
Hemoglobin: 10.9 g/dL — ABNORMAL LOW (ref 13.0–17.0)
O2 Saturation: 94 %
Patient temperature: 102.5
Potassium: 7.5 mmol/L (ref 3.5–5.1)
Sodium: 130 mmol/L — ABNORMAL LOW (ref 135–145)
TCO2: 17 mmol/L — ABNORMAL LOW (ref 22–32)
pCO2 arterial: 31.3 mmHg — ABNORMAL LOW (ref 32.0–48.0)
pH, Arterial: 7.335 — ABNORMAL LOW (ref 7.350–7.450)
pO2, Arterial: 83 mmHg (ref 83.0–108.0)

## 2021-12-03 LAB — PROTIME-INR
INR: 1.2 (ref 0.8–1.2)
Prothrombin Time: 15.6 seconds — ABNORMAL HIGH (ref 11.4–15.2)

## 2021-12-03 LAB — RESP PANEL BY RT-PCR (FLU A&B, COVID) ARPGX2
Influenza A by PCR: NEGATIVE
Influenza B by PCR: NEGATIVE
SARS Coronavirus 2 by RT PCR: NEGATIVE

## 2021-12-03 LAB — LACTIC ACID, PLASMA: Lactic Acid, Venous: 1.1 mmol/L (ref 0.5–1.9)

## 2021-12-03 LAB — CBG MONITORING, ED: Glucose-Capillary: 77 mg/dL (ref 70–99)

## 2021-12-03 MED ORDER — DEXTROSE 50 % IV SOLN
1.0000 | Freq: Once | INTRAVENOUS | Status: AC
  Administered 2021-12-04: 50 mL via INTRAVENOUS
  Filled 2021-12-03: qty 50

## 2021-12-03 MED ORDER — ACETAMINOPHEN 650 MG RE SUPP
650.0000 mg | Freq: Once | RECTAL | Status: AC
Start: 2021-12-03 — End: 2021-12-03
  Administered 2021-12-03: 650 mg via RECTAL
  Filled 2021-12-03: qty 1

## 2021-12-03 MED ORDER — SODIUM CHLORIDE 0.9 % IV BOLUS
1000.0000 mL | Freq: Once | INTRAVENOUS | Status: AC
  Administered 2021-12-04: 1000 mL via INTRAVENOUS

## 2021-12-03 MED ORDER — CALCIUM GLUCONATE-NACL 1-0.675 GM/50ML-% IV SOLN
1.0000 g | Freq: Once | INTRAVENOUS | Status: AC
  Administered 2021-12-04: 1000 mg via INTRAVENOUS
  Filled 2021-12-03: qty 50

## 2021-12-03 MED ORDER — SODIUM CHLORIDE 0.9 % IV SOLN
2.0000 g | Freq: Once | INTRAVENOUS | Status: AC
  Administered 2021-12-03: 2 g via INTRAVENOUS
  Filled 2021-12-03: qty 2

## 2021-12-03 MED ORDER — SODIUM ZIRCONIUM CYCLOSILICATE 10 G PO PACK: 10.0000 g | PACK | Freq: Once | ORAL | Status: DC

## 2021-12-03 MED ORDER — LEVETIRACETAM IN NACL 1500 MG/100ML IV SOLN
1500.0000 mg | Freq: Once | INTRAVENOUS | Status: AC
  Administered 2021-12-03: 1500 mg via INTRAVENOUS
  Filled 2021-12-03: qty 100

## 2021-12-03 MED ORDER — INSULIN ASPART 100 UNIT/ML IV SOLN
5.0000 [IU] | Freq: Once | INTRAVENOUS | Status: AC
  Administered 2021-12-04: 5 [IU] via INTRAVENOUS

## 2021-12-03 NOTE — ED Notes (Signed)
Pharmacy has been called for Keppra to be sent. Waiting on Pharmacy to send [place].

## 2021-12-03 NOTE — ED Notes (Signed)
Patient transported to CT 

## 2021-12-03 NOTE — Progress Notes (Signed)
[place] OFFICE PROGRESS NOTE   Diagnosis: Rectal cancer  INTERVAL HISTORY:   Bradley Colon was last seen at the cancer center in early December.  He was admitted to [facility] 10/12/2021 with abdominal pain, nausea/vomiting, and CT evidence of a small bowel obstruction.  An NG tube was placed.  He accidentally pulled the NG tube.  His diet was advanced and he was able to pass flatus and have a bowel movement.  He was discharged to home on 10/14/2021.  Bradley Colon has multiple complaints today including leg edema, urinary/rectal urgency, and perineal pain.  He continues a narcotic regimen prescribed by Dr. Hilma Favors.  He was seen in urology at [facility] on 11/27/2021.  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  He is now performing self-catheterization twice daily.  He reports also having a bowel movement when he performs self-catheterization. He has developed a tremor.  He is somnolent.  He has been taking MS Contin and oxycodone at variable intervals. Objective:  Vital signs in last 24 hours:  Blood pressure (!) 168/85, pulse 100, temperature 98.1 F (36.7 C), temperature source Oral, resp. rate 20, height 5' 7" (1.702 m), weight 193 lb 6.4 oz (87.7 kg), SpO2 98 %.    HEENT: The pupils are 2-3 mm and symmetric Resp: Lungs clear bilaterally Cardio: Regular rate and rhythm GI: Mildly distended, soft, ventral hernia Vascular: 1+ pitting edema at the lower leg bilaterally, trace pitting edema at the thigh bilaterally     Portacath/PICC-without erythema  Lab Results:  Lab Results  Component Value Date   WBC 9.2 10/07/2021   HGB 11.4 (L) 10/07/2021   HCT 35.5 (L) 10/07/2021   MCV 86.2 10/07/2021   PLT 477 (H) 10/07/2021   NEUTROABS 5.9 10/07/2021    CMP  Lab Results  Component Value Date   NA 136 10/07/2021   K 4.0 10/07/2021   CL 103 10/07/2021   CO2 26 10/07/2021   GLUCOSE 94 10/07/2021   BUN 14 10/07/2021   CREATININE 0.81 10/07/2021    CALCIUM 9.2 10/07/2021   PROT 6.2 (L) 10/07/2021   ALBUMIN 3.6 10/07/2021   AST 16 10/07/2021   ALT 16 10/07/2021   ALKPHOS 76 10/07/2021   BILITOT 0.5 10/07/2021   GFRNONAA >60 10/07/2021   GFRAA >60 01/02/2020    Lab Results  Component Value Date   CEA1 59.33 (H) 08/12/2021   CEA 37.59 (H) 10/07/2021    Medications: I have reviewed the patient's current medications.   Assessment/Plan: Rectal cancer, clinical stage III (T3cN2), Foundation 1 on lymph node 11/18/2019-tumor mutation burden 1, FG R1 amplification, K-ras wild-type, microsatellite status could not be determined Rectal mass extending from 4-9 cm on colonoscopy 02/15/2019 with a biopsy confirming invasive adenocarcinoma CTs 02/23/2019- thickening of the low rectum without a discrete mass, small bilateral pulmonary nodules, some present on imaging from 2014 Pelvic MRI 02/25/2019- T3cN2 tumor beginning at 4.9 cm from the anal verge and 0 cm from the internal anal sphincter, tumor extends through the muscularis propria, 2.  Rectal lymph nodes and 2 enlarged right internal iliac nodes CTs at [facility] 04/19/2019- new subcentimeter right hepatic lesion concerning for metastasis, stable low rectal mass with prominent mesorectal lymph nodes, stable pathologic enlarged right internal iliac nodes, stable pulmonary nodules Xeloda/radiation beginning 04/26/2019, completed 07/05/2019 CT 07/26/2019-growth of previously described right hepatic lesion, slight decrease in pelvic lymphadenopathy and rectal thickening 08/05/2019-week 1 bolus 5-FU/leucovorin (200 mg/m) 08/12/2019-week to bolus 5-FU/leucovorin (400 mg/m) 10/16/  2020-week 3 bolus 5-FU/leucovorin (400 mg/m) and oxaliplatin (42.5 mg/m) CT chest 10/04/2019-slight increase in bilateral pulmonary nodules concerning for metastases MRI abdomen 10/06/2019-right hepatic lesion consistent with metastasis, wall thickening of the low anterior rectum, no new or enlarging adenopathy, stable mesorectal and  right internal iliac nodes PET scan 10/14/2019-pulmonary nodules below PET resolution, hypermetabolic right posterior hepatic lesion, mildly hypermetabolic partially calcified right internal iliac chain lymphadenopathy, mild gastric hypermetabolism and thickening-likely gastritis 11/18/2019-LAR with diverting loop ileostomy and ablation of solitary liver metastasis (2 cm segment 7), moderately differentiated adenocarcinoma invading into soft tissue, LVI present, perineural invasion present, 5/15 lymph nodes positive, 4 tumor deposits, absent treatment effect-score 3,ypT3 ypN2a, mismatch repair protein expression intact, MSS CT chest 01/16/2020-increase in the size of a dominant right lower lobe nodule, unchanged left upper lobe and left lower lobe nodules CT chest 04/11/2020-enlargement of bilateral lung nodules, and new nodules CT chest 09/11/2020-enlargement of bilateral lung nodules, aggressive medial right liver lesion Cycle 1 FOLFOX 10/31/2020 Cycle 2 FOLFOX 11/14/2020 Cycle 3 FOLFOX 11/29/2020 CT chest 01/03/2021-multiple bilateral lung nodules, stable to slightly decreased in size, no new lesions, hepatic dome lesion is smaller (ablated lesion?) Cycle 4 FOLFOX 01/21/2021 CTs 07/02/2021-increase in size and number of pulmonary metastases, new mediastinal lymph node, increased right hepatic lobe metastasis Targeted bronchoscopy of right lower lobe lesion 07/18/2021-brushing revealed malignant cells consistent with adenocarcinoma Cycle 1 FOLFIRI/panitumumab 08/12/2021 Cycle 2 FOLFIRI/panitumumab 09/11/2021 CT abdomen/pelvis 10/10/2021-small bowel obstruction at loop of bowel in umbilical hernia, slight enlargement of bilateral pulmonary metastatic lesions compared to 07/02/2021, Pain secondary to #1, improved Reactive airways disease/asthma Report of multiple allergens Port-A-Cath related DVT, right IJ, 09/30/2019-apixaban, converted to Lovenox surrounding surgery January 2021, changed back to apixaban  12/30/2019 Erectile dysfunction following treatment for rectal cancer-followed by urology Rectal/perineal pain following the low anterior resection-persistent 8.   Admission to [facility] 10/12/2021 with clinical/radiologic evidence of a small bowel obstruction, NG tube placed, diet advanced and discharged home 10/14/2021 9.   Urinary retention/overflow incontinence-evaluated at [facility] 11/27/2021, performs self-catheterization twice daily    Disposition: Bradley. Reiling has metastatic rectal cancer.  He has been maintained off of systemic therapy since early November.  He has completed treatment with 2 cycles of FOLFIRI/panitumumab.  I explained it is too early to evaluate him for a response to the FOLFIRI/panitumumab.  I recommend resuming this regimen.  He agrees to resume FOLFIRI/panitumumab on 12/10/2021.  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  He is somnolent.  He became somnolent while in the examination room today.  I suspect he is sedated from the long-acting morphine.  I will contact Dr. Hilma Favors and request he see her this week.  I recommended he follow-up with urology for continued management of the urinary retention/incontinence.  The leg edema is likely secondary to lymphatic injury from surgery and radiation and polypharmacy.  I recommended he elevate the legs and use support stockings.  He will be scheduled for an office visit and FOLFIRI/panitumumab on 12/24/2021.  Betsy Coder, MD  12/03/2021  3:53 PM

## 2021-12-03 NOTE — ED Provider Notes (Signed)
[facility] EMERGENCY DEPARTMENT Provider Note   CSN: [id number] Arrival date & time: 12/03/21  2224     History  Chief Complaint  Patient presents with   Seizures    Bradley Colon is a 51 y.o. male.  51 year old male with prior medical history as detailed below presents for evaluation.  Patient is brought in from home via EMS.  Patient with reported seizure-like episode that occurred prior to EMS arrival.  EMS reports the patient appeared to be postictal upon their initial evaluation.  During transport to the ED EMS reports that the patient had a generalized tonic-clonic seizure.  This resolved with administration of IM and IV Versed.  Patient is somnolent upon arrival.  He appears to be sedated and possibly postictal.  History is unable to obtain secondary to his sedation.  Additional history obtained from patient's wife.  Patient with history of metastatic rectal cancer.  Patient is known to local oncology.  Patient with noted difficulty self cathing for urine 24 hours prior.  Patient's wife reports that his urine was dark and discolored at that time.  She is unsure as to whether the patient actually self cath earlier today.  Patient without prior history of seizure.  Patient's wife reports that the patient is full code.  The history is provided by medical records.  Seizures Seizure activity on arrival: no   Seizure type:  Grand mal Initial focality:  None Episode characteristics: abnormal movements   Postictal symptoms: somnolence   Return to baseline: no   Severity:  Severe     Home Medications Prior to Admission medications   Medication Sig Start Date End Date Taking? Authorizing Provider  albuterol (VENTOLIN HFA) 108 (90 Base) MCG/ACT inhaler INHALE 2 PUFFS INTO THE LUNGS EVERY 6 (SIX) HOURS AS NEEDED FOR WHEEZING OR SHORTNESS OF BREATH. 11/13/20 11/13/21  Ladell Pier, MD  ALPRAZolam Duanne Moron) 0.25 MG tablet Take 0.25 mg by mouth daily as needed for  anxiety. Patient not taking: Reported on 12/03/2021    [provider]  baclofen (LIORESAL) 10 MG tablet Take 1 tablet by mouth 3 times daily as needed for rectal pain and spasm. Patient not taking: Reported on 07/22/2021 07/11/21   Acquanetta Chain, DO  calcium carbonate (TUMS - DOSED IN MG ELEMENTAL CALCIUM) 500 MG chewable tablet Chew 1 tablet by mouth daily.  Patient not taking: Reported on 12/03/2021    [provider]  Clobetasol Prop Emollient Base (CLOBETASOL PROPIONATE E) 0.05 % emollient cream Apply 1 application topically 2 (two) times daily. Patient not taking: Reported on 07/11/2021 06/18/21   Acquanetta Chain, DO  diazepam (VALIUM) 10 MG tablet Take 1 tablet by mouth every 12 hours as needed for anxiety. Patient not taking: Reported on 12/03/2021 07/11/21   Acquanetta Chain, DO  diphenoxylate-atropine (LOMOTIL) 2.5-0.025 MG tablet Take 2 tablets by mouth four times daily as needed. 08/30/21     diphenoxylate-atropine (LOMOTIL) 2.5-0.025 MG tablet Take 1 tablet by mouth 4 (four) times daily as needed for diarrhea or loose stools. 08/30/21   Ladell Pier, MD  doxycycline (VIBRA-TABS) 100 MG tablet Take 1 tablet (100 mg total) by mouth 2 (two) times daily. Patient not taking: Reported on 12/03/2021 09/11/21   Ladell Pier, MD  gabapentin (NEURONTIN) 600 MG tablet Take 1 tablet (600 mg total) by mouth 2 (two) times daily. 07/11/21   Acquanetta Chain, DO  hydrOXYzine (VISTARIL) 50 MG capsule Take 1 capsule (50 mg total) by  mouth 3 (three) times daily as needed for anxiety or itching. Patient not taking: Reported on 12/03/2021 06/18/21   Acquanetta Chain, DO  Hyoscyamine Sulfate SL (LEVSIN/SL) 0.125 MG SUBL Place 1 tablet under the tongue every 8 (eight) hours as needed (as needed for stomach cramps). 09/30/21   Ladell Pier, MD  morphine (MS CONTIN) 60 MG 12 hr tablet Take 1 tablet by mouth every 12 hours. 11/11/21   Acquanetta Chain, DO  naloxegol  oxalate (MOVANTIK) 25 MG TABS tablet Take 1 tablet (25 mg total) by mouth daily. For Severe Constipation Patient not taking: Reported on 12/03/2021 05/17/21   Acquanetta Chain, DO  Nitroglycerin (RECTIV) 0.4 % OINT Place 1 application rectally daily as needed (fissure pain). Patient not taking: Reported on 07/22/2021    [provider]  NON FORMULARY Take 2-3 capsules by mouth daily. Turmeric and Boswella Patient not taking: Reported on 07/22/2021    [provider]  NONFORMULARY OR COMPOUNDED ITEM Place 1 application rectally every 6 (six) hours as needed (fissure pain). Nifedipine-lidocaine 0.3-1.5% rectal ointment Patient not taking: Reported on 07/22/2021    [provider]  Nutritional Supplements (ENSURE PLANT-BASED PROTEIN) LIQD Take 1 Can by mouth 2 (two) times daily. 11/24/19   [provider]  ondansetron (ZOFRAN) 8 MG tablet Take 1 tablet (8 mg total) by mouth every 8 (eight) hours as needed for nausea or vomiting. 09/30/21   Ladell Pier, MD  Oxycodone HCl 20 MG TABS Take 1 tablet by mouth 3 times daily as needed for severe pain. Patient not taking: Reported on 12/03/2021 11/11/21   Acquanetta Chain, DO  pantoprazole (PROTONIX) 40 MG tablet Take 1 tablet (40 mg total) by mouth daily. 08/22/21   Ladell Pier, MD  phenazopyridine (PYRIDIUM) 100 MG tablet Take 1 tablet (100 mg total) by mouth 3 (three) times daily as needed for pain (Pain with urination). 06/03/21   Acquanetta Chain, DO  tamsulosin (FLOMAX) 0.4 MG CAPS capsule Take 2 capsules (0.8 mg total) by mouth daily. 07/11/21   Acquanetta Chain, DO      Allergies    Bee venom, Other, Contrast media [iodinated contrast media], Shrimp [shellfish allergy], Vancomycin, Wheat bran, and Latex    Review of Systems   Review of Systems  Unable to perform ROS: Acuity of condition  Neurological:  Positive for seizures.   Physical Exam Updated Vital Signs BP 122/75    Pulse (!) 107    Temp  (!) 104.3 F (40.2 C) (Rectal)    Resp 14    SpO2 99%  Physical Exam Vitals and nursing note reviewed.  Constitutional:      General: He is not in acute distress.    Appearance: Normal appearance. He is well-developed.     Comments: Somnolent, sedated.  Patient's airway is protected.    HENT:     Head: Normocephalic and atraumatic.  Eyes:     Conjunctiva/sclera: Conjunctivae normal.     Pupils: Pupils are equal, round, and reactive to light.  Cardiovascular:     Rate and Rhythm: Normal rate and regular rhythm.     Heart sounds: Normal heart sounds.  Pulmonary:     Effort: Pulmonary effort is normal. No respiratory distress.     Breath sounds: Normal breath sounds.  Abdominal:     General: There is no distension.     Palpations: Abdomen is soft.     Tenderness: There is no abdominal tenderness.  Comments: Palpable masses in the right lower quadrant of the abdomen.  Musculoskeletal:        General: No deformity. Normal range of motion.     Cervical back: Normal range of motion and neck supple.  Skin:    General: Skin is warm and dry.  Neurological:     Comments: Somnolent, sedated, possibly postictal    ED Results / Procedures / Treatments   Labs (all labs ordered are listed, but only abnormal results are displayed) Labs Reviewed  CBC WITH DIFFERENTIAL/PLATELET - Abnormal; Notable for the following components:      Result Value   WBC 12.4 (*)    RBC 3.59 (*)    Hemoglobin 9.8 (*)    HCT 30.6 (*)    Platelets 558 (*)    Neutro Abs 11.5 (*)    Lymphs Abs 0.4 (*)    Abs Immature Granulocytes 0.09 (*)    All other components within normal limits  PROTIME-INR - Abnormal; Notable for the following components:   Prothrombin Time 15.6 (*)    All other components within normal limits  I-STAT CHEM 8, ED - Abnormal; Notable for the following components:   Sodium 130 (*)    Potassium 7.7 (*)    BUN 92 (*)    Creatinine, Ser 14.90 (*)    TCO2 20 (*)    Hemoglobin 10.9  (*)    HCT 32.0 (*)    All other components within normal limits  I-STAT ARTERIAL BLOOD GAS, ED - Abnormal; Notable for the following components:   pH, Arterial 7.335 (*)    pCO2 arterial 31.3 (*)    Bicarbonate 16.3 (*)    TCO2 17 (*)    Acid-base deficit 8.0 (*)    Sodium 130 (*)    Potassium 7.5 (*)    HCT 32.0 (*)    Hemoglobin 10.9 (*)    All other components within normal limits  RESP PANEL BY RT-PCR (FLU A&B, COVID) ARPGX2  CULTURE, BLOOD (ROUTINE X 2)  CULTURE, BLOOD (ROUTINE X 2)  URINE CULTURE  COMPREHENSIVE METABOLIC PANEL  LACTIC ACID, PLASMA  LACTIC ACID, PLASMA  URINALYSIS, ROUTINE W REFLEX MICROSCOPIC  CBG MONITORING, ED    EKG EKG Interpretation  Date/Time:  Tuesday December 03 2021 22:24:46 EST Ventricular Rate:  121 PR Interval:  142 QRS Duration: 92 QT Interval:  286 QTC Calculation: 406 R Axis:   144 Text Interpretation: Sinus tachycardia Right axis deviation Abnormal R-wave progression, late transition Confirmed by Dene Gentry [phone]) on 12/03/2021 10:40:30 PM  Radiology DG Chest Port 1 View  Result Date: 12/03/2021 CLINICAL DATA:  Shortness of breath, seizure. EXAM: PORTABLE CHEST 1 VIEW COMPARISON:  Chest x-ray 07/18/2021. CT chest 07/17/2021. FINDINGS: Right chest port catheter tip projects over the cavoatrial junction, unchanged. Lung volumes are low. Bilateral pulmonary nodules are grossly unchanged. Airspace disease has increased in the right lower lung. There is no definite pleural effusion or pneumothorax. No acute fractures are seen. The heart is enlarged, unchanged. IMPRESSION: 1. Bilateral pulmonary nodules are grossly unchanged. 2. New patchy airspace disease in the right lower lung worrisome for infection. Followup PA and lateral chest X-ray is recommended in 3-4 weeks following trial of antibiotic therapy to ensure resolution and exclude underlying malignancy. Electronically Signed   By: Ronney Asters M.D.   On: 12/03/2021 23:17     Procedures Procedures    Medications Ordered in ED Medications  ceFEPIme (MAXIPIME) 2 g in sodium chloride 0.9 % 100 mL  IVPB (2 g Intravenous New Bag/Given 12/03/21 2335)  sodium chloride 0.9 % bolus 1,000 mL (has no administration in time range)  sodium zirconium cyclosilicate (LOKELMA) packet 10 g (has no administration in time range)  calcium gluconate 1 g/ 50 mL sodium chloride IVPB (has no administration in time range)  insulin aspart (novoLOG) injection 5 Units (has no administration in time range)    And  dextrose 50 % solution 50 mL (has no administration in time range)  levETIRAcetam (KEPPRA) IVPB 1500 mg/ 100 mL premix (0 mg Intravenous Stopped 12/03/21 2331)  acetaminophen (TYLENOL) suppository 650 mg (650 mg Rectal Given 12/03/21 2338)    ED Course/ Medical Decision Making/ A&P Clinical Course as of 12/03/21 2350  Tue Dec 03, 2021  2237 Potassium(!!): 7.5 [PM]    Clinical Course User Index [PM] Valarie Merino, MD                           Medical Decision Making Amount and/or Complexity of Data Reviewed Labs: ordered. Decision-making details documented in ED Course. Radiology: ordered.  Risk OTC drugs. Prescription drug management.    Medical Screen Complete  This patient presented to the ED with complaint of possible seizure activity, altered mental status.  This complaint involves an extensive number of treatment options. The initial differential diagnosis includes, but is not limited to, EMS secondary to seizure, postictal state, encephalopathy, metabolic abnormality, sepsis, infection, etc  This presentation is: Acute, Previously Undiagnosed, Uncertain Prognosis, Complicated, Systemic Symptoms, and Threat to Life/Bodily Function   Patient presented after apparent episode of seizure at home and then a recurrent episode of seizure with EMS.  Received Versed in route to the ED. Upon arrival he is sedated and possibly postictal. Keppra load initiated  in the ED.  Neuro aware of case and agrees with plan of care. Initial Neuro imaging ordered.  Patient noted to be febrile. Wife reports recent difficulty with self-cathing at home. Strongly suspicious of possible Urine infection/obstruction. Broad spectrum antibiotics ordered in the ED.   Initial EKG without evidence of acute ischemia/hyperkalemia.  Initial labs suggestive of hyperkalemia and AKI. Treatment of Hyperkalemia initiated.   Patient will require admission.   Pending labs and dispo signed out to oncoming EDP.   Co morbidities that complicated the patient's evaluation  Metastatic rectal cancer   Additional history obtained:  Additional history obtained from Spouse External records from outside sources obtained and reviewed including prior ED visits and prior Inpatient records.    Lab Tests:  I ordered and personally interpreted labs.     Imaging Studies ordered:  I ordered imaging studies including ct head / cxr  Independently reviewed CT head and CXR - NAD I agree with the radiologist interpretation.   Cardiac Monitoring:  The patient was maintained on a cardiac monitor.  I personally viewed and interpreted the cardiac monitor which showed an underlying rhythm of: Sinus Tach     Problem List / ED Course:  Seizure/AMS/Fever/Suspected infection/AKI/Hyperkalemia    Disposition:  After consideration of the diagnostic results and the patients response to treatment, I feel that the patent would benefit from admission.     CRITICAL CARE Performed by: Valarie Merino   Total critical care time: 45 minutes  Critical care time was exclusive of separately billable procedures and treating other patients.  Critical care was necessary to treat or prevent imminent or life-threatening deterioration.  Critical care was time spent personally by me on  the following activities: development of treatment plan with patient and/or surrogate as well as nursing,  discussions with consultants, evaluation of patient's response to treatment, examination of patient, obtaining history from patient or surrogate, ordering and performing treatments and interventions, ordering and review of laboratory studies, ordering and review of radiographic studies, pulse oximetry and re-evaluation of patient's condition.        Final Clinical Impression(s) / ED Diagnoses Final diagnoses:  Seizure-like activity ([place])  Altered mental status, unspecified altered mental status type  Hyperkalemia  AKI (acute kidney injury) ([place])    Rx / DC Orders ED Discharge Orders     None         Valarie Merino, MD 12/04/21 0002

## 2021-12-04 ENCOUNTER — Inpatient Hospital Stay (HOSPITAL_COMMUNITY): Payer: Medicaid Other

## 2021-12-04 ENCOUNTER — Encounter (HOSPITAL_COMMUNITY): Payer: Self-pay | Admitting: Internal Medicine

## 2021-12-04 ENCOUNTER — Encounter: Payer: Self-pay | Admitting: Oncology

## 2021-12-04 ENCOUNTER — Emergency Department (HOSPITAL_COMMUNITY): Payer: Medicaid Other

## 2021-12-04 DIAGNOSIS — G40409 Other generalized epilepsy and epileptic syndromes, not intractable, without status epilepticus: Secondary | ICD-10-CM

## 2021-12-04 DIAGNOSIS — T83511A Infection and inflammatory reaction due to indwelling urethral catheter, initial encounter: Secondary | ICD-10-CM | POA: Diagnosis present

## 2021-12-04 DIAGNOSIS — N170 Acute kidney failure with tubular necrosis: Secondary | ICD-10-CM | POA: Diagnosis present

## 2021-12-04 DIAGNOSIS — A419 Sepsis, unspecified organism: Secondary | ICD-10-CM

## 2021-12-04 DIAGNOSIS — D630 Anemia in neoplastic disease: Secondary | ICD-10-CM | POA: Diagnosis present

## 2021-12-04 DIAGNOSIS — E871 Hypo-osmolality and hyponatremia: Secondary | ICD-10-CM | POA: Diagnosis present

## 2021-12-04 DIAGNOSIS — C7801 Secondary malignant neoplasm of right lung: Secondary | ICD-10-CM | POA: Diagnosis present

## 2021-12-04 DIAGNOSIS — E875 Hyperkalemia: Secondary | ICD-10-CM

## 2021-12-04 DIAGNOSIS — G8929 Other chronic pain: Secondary | ICD-10-CM | POA: Diagnosis present

## 2021-12-04 DIAGNOSIS — R6520 Severe sepsis without septic shock: Secondary | ICD-10-CM | POA: Diagnosis not present

## 2021-12-04 DIAGNOSIS — Z5329 Procedure and treatment not carried out because of patient's decision for other reasons: Secondary | ICD-10-CM | POA: Diagnosis present

## 2021-12-04 DIAGNOSIS — M79661 Pain in right lower leg: Secondary | ICD-10-CM | POA: Diagnosis not present

## 2021-12-04 DIAGNOSIS — E162 Hypoglycemia, unspecified: Secondary | ICD-10-CM | POA: Diagnosis present

## 2021-12-04 DIAGNOSIS — C7800 Secondary malignant neoplasm of unspecified lung: Secondary | ICD-10-CM

## 2021-12-04 DIAGNOSIS — C787 Secondary malignant neoplasm of liver and intrahepatic bile duct: Secondary | ICD-10-CM | POA: Diagnosis present

## 2021-12-04 DIAGNOSIS — M7989 Other specified soft tissue disorders: Secondary | ICD-10-CM | POA: Diagnosis not present

## 2021-12-04 DIAGNOSIS — C7802 Secondary malignant neoplasm of left lung: Secondary | ICD-10-CM | POA: Diagnosis present

## 2021-12-04 DIAGNOSIS — Z85048 Personal history of other malignant neoplasm of rectum, rectosigmoid junction, and anus: Secondary | ICD-10-CM | POA: Diagnosis not present

## 2021-12-04 DIAGNOSIS — Y842 Radiological procedure and radiotherapy as the cause of abnormal reaction of the patient, or of later complication, without mention of misadventure at the time of the procedure: Secondary | ICD-10-CM | POA: Diagnosis present

## 2021-12-04 DIAGNOSIS — N10 Acute tubulo-interstitial nephritis: Secondary | ICD-10-CM

## 2021-12-04 DIAGNOSIS — Z923 Personal history of irradiation: Secondary | ICD-10-CM | POA: Diagnosis not present

## 2021-12-04 DIAGNOSIS — R609 Edema, unspecified: Secondary | ICD-10-CM | POA: Diagnosis not present

## 2021-12-04 DIAGNOSIS — E872 Acidosis, unspecified: Secondary | ICD-10-CM | POA: Diagnosis present

## 2021-12-04 DIAGNOSIS — G9341 Metabolic encephalopathy: Secondary | ICD-10-CM

## 2021-12-04 DIAGNOSIS — R569 Unspecified convulsions: Secondary | ICD-10-CM | POA: Diagnosis not present

## 2021-12-04 DIAGNOSIS — N138 Other obstructive and reflux uropathy: Secondary | ICD-10-CM | POA: Diagnosis present

## 2021-12-04 DIAGNOSIS — Z1629 Resistance to other single specified antibiotic: Secondary | ICD-10-CM | POA: Diagnosis present

## 2021-12-04 DIAGNOSIS — B9689 Other specified bacterial agents as the cause of diseases classified elsewhere: Secondary | ICD-10-CM | POA: Diagnosis present

## 2021-12-04 DIAGNOSIS — Y846 Urinary catheterization as the cause of abnormal reaction of the patient, or of later complication, without mention of misadventure at the time of the procedure: Secondary | ICD-10-CM | POA: Diagnosis present

## 2021-12-04 DIAGNOSIS — Z95828 Presence of other vascular implants and grafts: Secondary | ICD-10-CM | POA: Diagnosis not present

## 2021-12-04 DIAGNOSIS — Z20822 Contact with and (suspected) exposure to covid-19: Secondary | ICD-10-CM | POA: Diagnosis present

## 2021-12-04 DIAGNOSIS — J45909 Unspecified asthma, uncomplicated: Secondary | ICD-10-CM | POA: Diagnosis present

## 2021-12-04 DIAGNOSIS — R300 Dysuria: Secondary | ICD-10-CM | POA: Diagnosis present

## 2021-12-04 DIAGNOSIS — C20 Malignant neoplasm of rectum: Secondary | ICD-10-CM

## 2021-12-04 DIAGNOSIS — R7881 Bacteremia: Secondary | ICD-10-CM | POA: Diagnosis present

## 2021-12-04 DIAGNOSIS — G4089 Other seizures: Secondary | ICD-10-CM | POA: Diagnosis present

## 2021-12-04 HISTORY — DX: Acute kidney failure with tubular necrosis: N17.0

## 2021-12-04 LAB — RENAL FUNCTION PANEL
Albumin: 1.8 g/dL — ABNORMAL LOW (ref 3.5–5.0)
Albumin: 2.6 g/dL — ABNORMAL LOW (ref 3.5–5.0)
Anion gap: 11 (ref 5–15)
Anion gap: 14 (ref 5–15)
BUN: 71 mg/dL — ABNORMAL HIGH (ref 6–20)
BUN: 57 mg/dL — ABNORMAL HIGH (ref 6–20)
CO2: 14 mmol/L — ABNORMAL LOW (ref 22–32)
CO2: 21 mmol/L — ABNORMAL LOW (ref 22–32)
Calcium: 7.4 mg/dL — ABNORMAL LOW (ref 8.9–10.3)
Calcium: 9.2 mg/dL (ref 8.9–10.3)
Chloride: 112 mmol/L — ABNORMAL HIGH (ref 98–111)
Chloride: 101 mmol/L (ref 98–111)
Creatinine, Ser: 9.97 mg/dL — ABNORMAL HIGH (ref 0.61–1.24)
Creatinine, Ser: 7.92 mg/dL — ABNORMAL HIGH (ref 0.61–1.24)
GFR, Estimated: 6 mL/min — ABNORMAL LOW (ref >60)
GFR, Estimated: 8 mL/min — ABNORMAL LOW (ref >60)
Glucose, Bld: 59 mg/dL — ABNORMAL LOW (ref 70–99)
Glucose, Bld: 125 mg/dL — ABNORMAL HIGH (ref 70–99)
Phosphorus: 4.7 mg/dL — ABNORMAL HIGH (ref 2.5–4.6)
Phosphorus: 5 mg/dL — ABNORMAL HIGH (ref 2.5–4.6)
Potassium: 6.2 mmol/L — ABNORMAL HIGH (ref 3.5–5.1)
Potassium: 6.2 mmol/L — ABNORMAL HIGH (ref 3.5–5.1)
Sodium: 137 mmol/L (ref 135–145)
Sodium: 136 mmol/L (ref 135–145)

## 2021-12-04 LAB — COMPREHENSIVE METABOLIC PANEL
ALT: 18 U/L (ref 0–44)
AST: 22 U/L (ref 15–41)
Albumin: 2.9 g/dL — ABNORMAL LOW (ref 3.5–5.0)
Alkaline Phosphatase: 72 U/L (ref 38–126)
Anion gap: 16 — ABNORMAL HIGH (ref 5–15)
BUN: 89 mg/dL — ABNORMAL HIGH (ref 6–20)
CO2: 18 mmol/L — ABNORMAL LOW (ref 22–32)
Calcium: 9.2 mg/dL (ref 8.9–10.3)
Chloride: 97 mmol/L — ABNORMAL LOW (ref 98–111)
Creatinine, Ser: 14.24 mg/dL — ABNORMAL HIGH (ref 0.61–1.24)
GFR, Estimated: 4 mL/min — ABNORMAL LOW (ref >60)
Glucose, Bld: 80 mg/dL (ref 70–99)
Potassium: >7.5 mmol/L (ref 3.5–5.1)
Sodium: 131 mmol/L — ABNORMAL LOW (ref 135–145)
Total Bilirubin: 0.7 mg/dL (ref 0.3–1.2)
Total Protein: 7.6 g/dL (ref 6.5–8.1)

## 2021-12-04 LAB — CBG MONITORING, ED
Glucose-Capillary: 41 mg/dL — CL (ref 70–99)
Glucose-Capillary: 55 mg/dL — ABNORMAL LOW (ref 70–99)
Glucose-Capillary: 61 mg/dL — ABNORMAL LOW (ref 70–99)
Glucose-Capillary: 102 mg/dL — ABNORMAL HIGH (ref 70–99)
Glucose-Capillary: 95 mg/dL (ref 70–99)
Glucose-Capillary: 30 mg/dL — CL (ref 70–99)
Glucose-Capillary: 98 mg/dL (ref 70–99)

## 2021-12-04 LAB — URINALYSIS, ROUTINE W REFLEX MICROSCOPIC

## 2021-12-04 LAB — URINALYSIS, MICROSCOPIC (REFLEX): WBC, UA: >50 WBC/hpf (ref 0–5)

## 2021-12-04 LAB — HIV ANTIBODY (ROUTINE TESTING W REFLEX): HIV Screen 4th Generation wRfx: NONREACTIVE

## 2021-12-04 LAB — LACTIC ACID, PLASMA: Lactic Acid, Venous: 1.7 mmol/L (ref 0.5–1.9)

## 2021-12-04 MED ORDER — PANTOPRAZOLE SODIUM 40 MG IV SOLR
40.0000 mg | INTRAVENOUS | Status: DC
  Administered 2021-12-04 – 2021-12-08 (×5): 40 mg via INTRAVENOUS
  Filled 2021-12-04 (×5): qty 40

## 2021-12-04 MED ORDER — ACETAMINOPHEN 650 MG RE SUPP: 650.0000 mg | Freq: Four times a day (QID) | RECTAL | Status: DC | PRN

## 2021-12-04 MED ORDER — DEXTROSE 10 % IV SOLN: INTRAVENOUS | Status: DC

## 2021-12-04 MED ORDER — DEXTROSE 50 % IV SOLN
INTRAVENOUS | Status: AC
  Filled 2021-12-04: qty 50

## 2021-12-04 MED ORDER — HEPARIN SODIUM (PORCINE) 5000 UNIT/ML IJ SOLN
5000.0000 [IU] | Freq: Three times a day (TID) | INTRAMUSCULAR | Status: DC
  Administered 2021-12-04 – 2021-12-08 (×13): 5000 [IU] via SUBCUTANEOUS
  Filled 2021-12-04 (×12): qty 1

## 2021-12-04 MED ORDER — ONDANSETRON HCL 4 MG/2ML IJ SOLN: 4.0000 mg | Freq: Four times a day (QID) | INTRAMUSCULAR | Status: DC | PRN

## 2021-12-04 MED ORDER — DEXTROSE 50 % IV SOLN
1.0000 | Freq: Once | INTRAVENOUS | Status: DC
  Filled 2021-12-04: qty 50

## 2021-12-04 MED ORDER — SODIUM BICARBONATE 8.4 % IV SOLN
INTRAVENOUS | Status: DC
  Filled 2021-12-04 (×4): qty 1000

## 2021-12-04 MED ORDER — TAMSULOSIN HCL 0.4 MG PO CAPS
0.8000 mg | ORAL_CAPSULE | Freq: Every day | ORAL | Status: DC
  Administered 2021-12-04 – 2021-12-07 (×4): 0.8 mg via ORAL
  Filled 2021-12-04 (×4): qty 2

## 2021-12-04 MED ORDER — ONDANSETRON HCL 4 MG PO TABS: 4.0000 mg | ORAL_TABLET | Freq: Four times a day (QID) | ORAL | Status: DC | PRN

## 2021-12-04 MED ORDER — POLYETHYLENE GLYCOL 3350 17 G PO PACK
17.0000 g | PACK | Freq: Every day | ORAL | Status: DC | PRN
Start: 2021-12-04 — End: 2021-12-08

## 2021-12-04 MED ORDER — SODIUM CHLORIDE 0.9 % IV BOLUS
1000.0000 mL | Freq: Once | INTRAVENOUS | Status: AC
  Administered 2021-12-04: 1000 mL via INTRAVENOUS

## 2021-12-04 MED ORDER — SODIUM CHLORIDE 0.9 % IV SOLN
1.0000 g | INTRAVENOUS | Status: DC
  Administered 2021-12-04 – 2021-12-06 (×2): 1 g via INTRAVENOUS
  Filled 2021-12-04 (×4): qty 1

## 2021-12-04 MED ORDER — ACETAMINOPHEN 325 MG PO TABS
650.0000 mg | ORAL_TABLET | Freq: Four times a day (QID) | ORAL | Status: DC | PRN
  Administered 2021-12-06 – 2021-12-08 (×5): 650 mg via ORAL
  Filled 2021-12-04 (×5): qty 2

## 2021-12-04 MED ORDER — LEVETIRACETAM IN NACL 500 MG/100ML IV SOLN
500.0000 mg | Freq: Two times a day (BID) | INTRAVENOUS | Status: DC
  Administered 2021-12-04 – 2021-12-07 (×7): 500 mg via INTRAVENOUS
  Filled 2021-12-04 (×10): qty 100

## 2021-12-04 MED ORDER — VANCOMYCIN HCL IN DEXTROSE 1-5 GM/200ML-% IV SOLN
1000.0000 mg | Freq: Once | INTRAVENOUS | Status: AC
  Administered 2021-12-04: 1000 mg via INTRAVENOUS
  Filled 2021-12-04: qty 200

## 2021-12-04 NOTE — Assessment & Plan Note (Addendum)
-  due to AKI, Lokelma, calcium gluconate, dextrose and insulin. Nephrology following.  Potassium normalized now

## 2021-12-04 NOTE — Assessment & Plan Note (Addendum)
-  in the setting of sepsis, uremia.  Mental status has returned to baseline

## 2021-12-04 NOTE — Progress Notes (Signed)
Patient seen and examined this morning, admitted overnight, H&P reviewed and agree with the assessment and plan  Severe sepsis due to UTI/pyelonephritis-likely due to UTI, he has been recently diagnosed with a urinary retention, had a cystoscopy 1/25 with [facility], he was instructed to self catheterize but apparently has not been doing this consistently.  He is septic on admission with fever, elevated WBC and a UA suggesting urinary tract infection.  He was placed on broad-spectrum antibiotics, continue monitoring cultures  Acute kidney injury-likely obstructive in the setting of retention.  He has had a Foley catheter placed, continue.  Nephrology consulted, appreciate input.  Kidney function improving today  Acute metabolic encephalopathy-in the setting of sepsis, uremia.  Mental status clearing  Seizure-likely in the setting of febrile illness/sepsis.  Neurology consulted, appreciate input.  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 Cruzita Lederer, MD, PhD [facility]  Between 7 am - 7 pm you can contact me via Amion (for emergencies) or [place] (non urgent matters).  I am not available 7 pm - 7 am, please contact night coverage MD/APP via Amion
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
## 2021-12-04 NOTE — Assessment & Plan Note (Deleted)
·   Please see assessment and plan above  Foley catheter placed due to urinary incontinence/retention  Patient would probably benefit from urology consultation prior to discharge and close outpatient follow-up
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
## 2021-12-04 NOTE — H&P (Signed)
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 on hold for now. Overall prognosis is guarded Based on clinical course will discuss goals of care with family         Code Status:  Full code  code status decision has been confirmed with: wife Family Communication: Wife has been updated on plan of care  Status is: Inpatient Remains inpatient appropriate because: Severe sepsis complicated by severe renal injury and hyperkalemia requiring broad-spectrum antibiotics, fluids, and expert consultation with multiple consultants.  CRITICAL CARE ATTESTATION  Patient is at significant risk of morbidity mortality secondary to severe sepsis with multiorgan dysfunction due to bilateral pyelonephritis, metabolic encephalopathy, severe renal injury, life-threatening hyperkalemia requiring administration of multiple life-saving agents including broad-spectrum antibiotic therapy, medications to shift potassium stores, aggressive intravenous  volume resuscitation as well as extensive work-up to identify the etiology of the patient's illness and coordination with  multiple consultants including nephrology, neurology, critical care.  Critical care time spent:  81 minutes  Planned Discharge Destination: Home with [place] MD [facility] Pager [phone]  If 7PM-7AM, please contact night-coverage [email or web address] Use universal [place] password for that web site. If you do not have the password, please call the hospital operator.  12/04/2021, 5:32 AM

## 2021-12-04 NOTE — Assessment & Plan Note (Addendum)
-  due to sepsis, resolved  CBG (last 3)  Recent Labs    12/07/21 1552 12/07/21 2123 12/08/21 0735  GLUCAP 134* 100* 91
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
## 2021-12-04 NOTE — Assessment & Plan Note (Addendum)
-  likely in the setting of febrile illness/sepsis.  Neurology consulted, appreciate input. EEG negative 2/1.  Recommending to continue [place] for 10 total days

## 2021-12-04 NOTE — Progress Notes (Signed)
EEG complete - results pending 

## 2021-12-04 NOTE — ED Triage Notes (Signed)
Patient arrives to ED BIB [facility] after having seizure like activity. Per EMS pt's family member witnessed the pt have a seizure and then not make any sense while talking. Per EMS pt has no Hx of Seizure but is taking Gabapentin and stopped taking medication x1 month ago. Per EMS upon their arrival pt was Hypertensive (BP 210/110), conscious and alert but "rambling" and once on the truck pt began having Focal Seizure and staring off. 5mg  Versed IM was administered by EMS with no improvement then an additional 2.5mg  Versed IV was administered calming pt down. Hx of Rectal cancer.  BP 164/84 HR 118 R 16 O2 99% NRB
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
## 2021-12-04 NOTE — Assessment & Plan Note (Addendum)
-  Due to renal failure, now resolved
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
## 2021-12-04 NOTE — ED Notes (Signed)
Upon initial assessment it was noted that pt had a bowel movement. This RN and Cope, NT removed the soiled clothing and linen, pt was cleaned up, Clean gown and linen were provided.

## 2021-12-04 NOTE — Progress Notes (Signed)
Pharmacy Antibiotic Note  Bradley Colon is a 51 y.o. male admitted on 12/03/2021 with AKI and urosepsis.  Pharmacy has been consulted for Cefepime dosing.  Plan: Cefepime 1 g IV q24h F/U renal function  Height: 5\' 7"  (170.2 cm) Weight: 87.7 kg (193 lb 5.5 oz) IBW/kg (Calculated) : 66.1  Temp (24hrs), Avg:99.9 F (37.7 C), Min:98 F (36.7 C), Max:104.3 F (40.2 C)  Recent Labs  Lab 12/03/21 2228 12/03/21 2231 12/03/21 2327 12/04/21 0123 12/04/21 0452  WBC 12.4*  --   --   --   --   CREATININE 14.24*  --  14.90*  --  9.97*  LATICACIDVEN  --  1.1  --  1.7  --     Estimated Creatinine Clearance: 9.2 mL/min (A) (by C-G formula based on SCr of 9.97 mg/dL (H)).    Allergies  Allergen Reactions   Bee Venom Anaphylaxis and Swelling    Pt has been stung by honey bees since he became a vegetarian at age 71 yrs old with no reaction.   Other Shortness Of Breath and Itching    Reaction to cut grass, pet dander   Contrast Media [Iodinated Contrast Media] Hives   Shrimp [Shellfish Allergy] Swelling and Other (See Comments)    Muscle cramps   Vancomycin Itching    Reaction to IV - pt states no reaction if it is infused slowly   Wheat Bran Swelling    Some swelling   Latex Hives and Rash    Reaction to fumes from latex paint     Caryl Pina 12/04/2021 6:03 AM

## 2021-12-04 NOTE — Progress Notes (Signed)
PHARMACY - PHYSICIAN COMMUNICATION CRITICAL VALUE ALERT - BLOOD CULTURE IDENTIFICATION (BCID)  Bradley Colon is an 51 y.o. male who presented to [facility] on 12/03/2021 with a chief complaint of seizures  Assessment:  59 YOM with metastatic rectal CA - next chemo planned 12/10/21. Recently with issues with urinary retention seen by urology and requiring self-caths. Concern for urosepsis and now with 4/4 BCx growing gram variable rods with BCID detecting Serratia marcescens, no resistance detected.   Name of physician (or Provider) ContactedRenne Crigler  Current antibiotics: Cefepime 1g/24h (renally adjusted for AKI)  Changes to prescribed antibiotics recommended:  Patient is on recommended antibiotics - No changes needed  Results for orders placed or performed during the hospital encounter of 12/03/21  Blood Culture ID Panel (Reflexed) (Collected: 12/03/2021 10:31 PM)  Result Value Ref Range   Enterococcus faecalis NOT DETECTED NOT DETECTED   Enterococcus Faecium NOT DETECTED NOT DETECTED   Listeria monocytogenes NOT DETECTED NOT DETECTED   Staphylococcus species NOT DETECTED NOT DETECTED   Staphylococcus aureus (BCID) NOT DETECTED NOT DETECTED   Staphylococcus epidermidis NOT DETECTED NOT DETECTED   Staphylococcus lugdunensis NOT DETECTED NOT DETECTED   Streptococcus species NOT DETECTED NOT DETECTED   Streptococcus agalactiae NOT DETECTED NOT DETECTED   Streptococcus pneumoniae NOT DETECTED NOT DETECTED   Streptococcus pyogenes NOT DETECTED NOT DETECTED   A.calcoaceticus-baumannii NOT DETECTED NOT DETECTED   Bacteroides fragilis NOT DETECTED NOT DETECTED   Enterobacterales DETECTED (A) NOT DETECTED   Enterobacter cloacae complex NOT DETECTED NOT DETECTED   Escherichia coli NOT DETECTED NOT DETECTED   Klebsiella aerogenes NOT DETECTED NOT DETECTED   Klebsiella oxytoca NOT DETECTED NOT DETECTED   Klebsiella pneumoniae NOT DETECTED NOT DETECTED   Proteus species NOT DETECTED NOT DETECTED    Salmonella species NOT DETECTED NOT DETECTED   Serratia marcescens DETECTED (A) NOT DETECTED   Haemophilus influenzae NOT DETECTED NOT DETECTED   Neisseria meningitidis NOT DETECTED NOT DETECTED   Pseudomonas aeruginosa NOT DETECTED NOT DETECTED   Stenotrophomonas maltophilia NOT DETECTED NOT DETECTED   Candida albicans NOT DETECTED NOT DETECTED   Candida auris NOT DETECTED NOT DETECTED   Candida glabrata NOT DETECTED NOT DETECTED   Candida krusei NOT DETECTED NOT DETECTED   Candida parapsilosis NOT DETECTED NOT DETECTED   Candida tropicalis NOT DETECTED NOT DETECTED   Cryptococcus neoformans/gattii NOT DETECTED NOT DETECTED   CTX-M ESBL NOT DETECTED NOT DETECTED   Carbapenem resistance IMP NOT DETECTED NOT DETECTED   Carbapenem resistance KPC NOT DETECTED NOT DETECTED   Carbapenem resistance NDM NOT DETECTED NOT DETECTED   Carbapenem resist OXA 48 LIKE NOT DETECTED NOT DETECTED   Carbapenem resistance VIM NOT DETECTED NOT DETECTED    Thank you for allowing pharmacy to be a part of this patients care.  Alycia Rossetti, PharmD, BCPS Infectious Diseases Clinical Pharmacist 12/04/2021 1:52 PM   **Pharmacist phone directory can now be found on [email or web address] ([email or web address]).  Listed under [place].

## 2021-12-04 NOTE — Procedures (Signed)
Patient Name: Bradley Colon  MRN: [id number]  Epilepsy Attending: Lora Havens  Referring Physician/Provider: Donnetta Simpers, MD Date: 12/04/2021 Duration: 22.34 mins  Patient history:  51 y.o. male with PMH significant for metastatic rectal cancer with pulm mets and multiple rounds of chemo and s/p radiation who presented to the ED with first seizure in the setting of significant electrolyte derangement from potential obstructive uropathy, urosepsis, AKI, uremia and hyperkalemia. EEG to evaluate for seizure  Level of alertness: Awake, drowsy  AEDs during EEG study: None  Technical aspects: This EEG study was done with scalp electrodes positioned according to the 10-20 International system of electrode placement. Electrical activity was acquired at a sampling rate of 500Hz  and reviewed with a high frequency filter of 70Hz  and a low frequency filter of 1Hz . EEG data were recorded continuously and digitally stored.   Description: The posterior dominant rhythm consists of 7.5 Hz activity of moderate voltage (25-35 uV) seen predominantly in posterior head regions, symmetric and reactive to eye opening and eye closing. Drowsiness was characterized by attenuation of the posterior background rhythm.  Sharp transients were noted in left occipital region.  Hyperventilation and photic stimulation were not performed.     IMPRESSION: This study is within normal limits. No seizures or epileptiform discharges were seen throughout the recording.  Bradley Colon [name]
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
## 2021-12-04 NOTE — ED Notes (Signed)
CBG Results of 41 has been notified to [place], MD. VRBO to administer Amp Dextrose 50%.

## 2021-12-04 NOTE — Progress Notes (Signed)
Lower extremity venous has been completed.   Preliminary results in CV Proc.   Jinny Blossom Lynnetta Tom 12/04/2021 9:34 AM

## 2021-12-04 NOTE — Hospital Course (Signed)
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  He has been following with Drs. Sherril and [facility].  Patient is S/P low anterior resection with diverting loop ileostomy and ablation of liver metastasis 11/2019 and has received radiation as well as multiple courses of chemotherapy with the most recent dose of chemotherapy (FOLFIRI/panitumubmab) in 09/2021 with recent complications of small bowel obstruction requiring hospitalization at [facility] 12/10-12/10/2021 and most recently, development of urinary retention and overall incontinence having been seen by [facility] urology on 1/25 requiring intermittent catheterization. Patient was brought into [facility] emergency department via EMS after experiencing an episode of seizure-like activity.  He was found to be febrile in the ED, have a UTI, also have acute renal failure with hyperkalemia.

## 2021-12-04 NOTE — Assessment & Plan Note (Addendum)
-  likely due to UTI due to self catheterization also with Serratia bacteremia. he has been recently diagnosed with a urinary retention, had a cystoscopy 1/25 with [facility], he was instructed to self catheterize but apparently has not been doing this consistently.  He was septic on admission with fever, elevated WBC and a UA suggesting urinary tract infection.  He was placed on broad-spectrum antibiotics, continue.  ID consulted, recommending 7 days of cefepime, last dose on 2/7. He could not be set up home health over the weekend due to insurance preauthorization and patient was supposed to be discharged on Monday, however, on Sunday, he was very insistent that he is discharged. Patient has been warned that this is not Medically advisable at this time, and can result in Medical complications like Death and Disability, patient understands and accepts the risks involved and assumes full responsibilty of this decision.

## 2021-12-04 NOTE — Assessment & Plan Note (Addendum)
-  outpatient management

## 2021-12-04 NOTE — Progress Notes (Signed)
Brief note :  51 y/o admitted with siezure  . History of metastatic rectal cancer. He has been seen at [facility] and on 1/25 was seen by urology with urinary retention and placed on intermittent self catheterization.  He presents to the emergency room with hyperkalemic, metabolic acidosis and acute renal failure.  In ER  K 7.5 was treated with IV insulin/dextrose, lokelma and IV sodium bicarbonate. A foley catheter is placed.  BP 98/69  pulse 93  temp 104.3  sats 97% 2 L [place]  Na 130 K 7.7  Cl 104  BUN 92  Cr 14.9    ( baseline Cr 0.8)  UOP 800 cc/ urine purulent  Meds  : no ACE/ARB  CT  no contrast  1. Mild prominence of the renal collecting systems bilaterally with perinephric a periureteral fat stranding. No ureteral calculus is identified. Findings may be infectious or inflammatory. 2. The urinary bladder is distended with perivesicular fat stranding, possible infectious or inflammatory cystitis. 3. Increased size and number of pulmonary nodules at the lung bases, compatible with known metastatic disease. 4. Aortic atherosclerosis. 5. Remaining chronic findings as described above.  A/P  Discussed with ER provider , who feel that patient does not require ICU level care at this point and step down would be appropriate. Supportive care from a renal standpoint with IV fluids ( will change to D5W with 3 amps bicarb)   antibiotics and foley catheter. Avoid nephrotoxins and adjust meds for renal failure. Daily renal panel. Close follow up on hyperkalemia with repeat labs ordered for 5 am  Full consult to follow in AM

## 2021-12-04 NOTE — Assessment & Plan Note (Addendum)
-  likely obstructive in the setting of retention.  He has had a Foley catheter placed, continue.  Nephrology consulted and followed patient.  Kidney function continues to improve, will have Foley catheter on discharge

## 2021-12-05 ENCOUNTER — Inpatient Hospital Stay (HOSPITAL_COMMUNITY): Payer: Medicaid Other

## 2021-12-05 DIAGNOSIS — A419 Sepsis, unspecified organism: Secondary | ICD-10-CM | POA: Diagnosis not present

## 2021-12-05 DIAGNOSIS — R6520 Severe sepsis without septic shock: Secondary | ICD-10-CM | POA: Diagnosis not present

## 2021-12-05 DIAGNOSIS — A498 Other bacterial infections of unspecified site: Secondary | ICD-10-CM

## 2021-12-05 LAB — GLUCOSE, CAPILLARY: Glucose-Capillary: 159 mg/dL — ABNORMAL HIGH (ref 70–99)

## 2021-12-05 LAB — COMPREHENSIVE METABOLIC PANEL
ALT: 14 U/L (ref 0–44)
AST: 26 U/L (ref 15–41)
Albumin: 2.1 g/dL — ABNORMAL LOW (ref 3.5–5.0)
Alkaline Phosphatase: 77 U/L (ref 38–126)
Anion gap: 11 (ref 5–15)
BUN: 52 mg/dL — ABNORMAL HIGH (ref 6–20)
CO2: 24 mmol/L (ref 22–32)
Calcium: 8.8 mg/dL — ABNORMAL LOW (ref 8.9–10.3)
Chloride: 104 mmol/L (ref 98–111)
Creatinine, Ser: 6.69 mg/dL — ABNORMAL HIGH (ref 0.61–1.24)
GFR, Estimated: 9 mL/min — ABNORMAL LOW (ref >60)
Glucose, Bld: 149 mg/dL — ABNORMAL HIGH (ref 70–99)
Potassium: 5.8 mmol/L — ABNORMAL HIGH (ref 3.5–5.1)
Sodium: 139 mmol/L (ref 135–145)
Total Bilirubin: 0.5 mg/dL (ref 0.3–1.2)
Total Protein: 5.8 g/dL — ABNORMAL LOW (ref 6.5–8.1)

## 2021-12-05 LAB — PHOSPHORUS: Phosphorus: 4.5 mg/dL (ref 2.5–4.6)

## 2021-12-05 LAB — CBC WITH DIFFERENTIAL/PLATELET
Abs Immature Granulocytes: 0.12 10*3/uL — ABNORMAL HIGH (ref 0.00–0.07)
Basophils Absolute: 0 10*3/uL (ref 0.0–0.1)
Basophils Relative: 0 %
Eosinophils Absolute: 0.2 10*3/uL (ref 0.0–0.5)
Eosinophils Relative: 1 %
HCT: 27 % — ABNORMAL LOW (ref 39.0–52.0)
Hemoglobin: 8.8 g/dL — ABNORMAL LOW (ref 13.0–17.0)
Immature Granulocytes: 1 %
Lymphocytes Relative: 4 %
Lymphs Abs: 0.7 10*3/uL (ref 0.7–4.0)
MCH: 27.1 pg (ref 26.0–34.0)
MCHC: 32.6 g/dL (ref 30.0–36.0)
MCV: 83.1 fL (ref 80.0–100.0)
Monocytes Absolute: 1.1 10*3/uL — ABNORMAL HIGH (ref 0.1–1.0)
Monocytes Relative: 7 %
Neutro Abs: 14.4 10*3/uL — ABNORMAL HIGH (ref 1.7–7.7)
Neutrophils Relative %: 87 %
Platelets: 476 10*3/uL — ABNORMAL HIGH (ref 150–400)
RBC: 3.25 MIL/uL — ABNORMAL LOW (ref 4.22–5.81)
RDW: 15.3 % (ref 11.5–15.5)
WBC: 16.7 10*3/uL — ABNORMAL HIGH (ref 4.0–10.5)
nRBC: 0 % (ref 0.0–0.2)

## 2021-12-05 LAB — MAGNESIUM: Magnesium: 1.7 mg/dL (ref 1.7–2.4)

## 2021-12-05 MED ORDER — CHLORHEXIDINE GLUCONATE CLOTH 2 % EX PADS
6.0000 | MEDICATED_PAD | Freq: Every day | CUTANEOUS | Status: DC
  Administered 2021-12-05 – 2021-12-07 (×3): 6 via TOPICAL

## 2021-12-05 MED ORDER — SODIUM CHLORIDE 0.9 % IV SOLN: INTRAVENOUS | Status: AC

## 2021-12-05 MED ORDER — SODIUM ZIRCONIUM CYCLOSILICATE 10 G PO PACK
10.0000 g | PACK | Freq: Once | ORAL | Status: AC
  Administered 2021-12-05: 10 g via ORAL
  Filled 2021-12-05: qty 1

## 2021-12-05 NOTE — Progress Notes (Signed)
Nephrology Follow-Up Consult note   Assessment/Recommendations: Bradley Colon is a/an 51 y.o. 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     Non-Oliguric/Anuric AKI (improved): Likely secondary to urinary obstruction due to not self cathing for overflow incontinence, BUN 52 and sCr improved to 6.69 this morning -Continue foley catheter and IV hydration -Continue to monitor daily Cr, Dose meds for GFR -Monitor Daily I/Os, Daily weight  -Maintain MAP>65 for optimal renal perfusion.  -Avoid nephrotoxic medications including NSAIDs and Vanc/Zosyn combo -Currently no indication for HD -Needs outpatient urology follow up for overflow incontinence    Metabolic acidosis/Hyperkalemia: Likely RTA4 in setting of urinary obstruction. Hyperkalemia >7.5 on admission improving slowly 5.8 this morning. No EKG changes. Acidosis resolved and glucose stable transitioned to NS -Continue to foley and IV fluids -Lokelma 10g once, mental status improved and tolerating PO -Monitor on tele   Volume Status: LE extremity swelling improved. Continue with foley.    Anemia due to sepsis, chronic disease: -Transfuse for Hgb<7 g/dL -No role for ESA in this setting   Sepsis secondary to pyelonephritis: Dysuria and pus in urine at home. Febrile, with leukocytosis and tachycardia, perinephric fat stranding on CT adm/pelvis on admission. Blood cultures growing serratia marcescens, urine culture with >100K gram negative rods -Currently on cefepime, management per ID -IV fluids -Follow up sensitivities  -Monitor fever and CBC   Acute encephalopathy/seizure: No history of seizure likely multifactorial in setting of infection, electrolyte derangement, postictal state, and seizure medications. Mental status improving  - Management per primary and  neurology - Currently on Keppra, ativan as needed for breakthough seizures - EEG without seizures, MR pending  Recommendations conveyed to primary service.   Bradley Beard, MD IM PGY-2 12/05/2021 8:52 AM  ___________________________________________________________  CC: Seizure  Interval History/Subjective: No acute overnight events. Patient reports foley is somewhat irritating him. Reports feeling a bit woozy this morning. Denies fever and chills. Flank pain improved today.    Medications:  Current Facility-Administered Medications  Medication Dose Route Frequency Provider Last Rate Last Admin   0.9 %  sodium chloride infusion   Intravenous Continuous Reesa Chew, MD       acetaminophen (TYLENOL) tablet 650 mg  650 mg Oral Q6H PRN Shalhoub, Sherryll Burger, MD       Or   acetaminophen (TYLENOL) suppository 650 mg  650 mg Rectal Q6H PRN Shalhoub, Sherryll Burger, MD       ceFEPIme (MAXIPIME) 1 g in sodium chloride 0.9 % 100 mL IVPB  1 g Intravenous Q24H Shalhoub, Sherryll Burger, MD 200 mL/hr at 12/04/21 2300 1 g at 12/04/21 2300   Chlorhexidine Gluconate Cloth 2 % PADS 6 each  6 each Topical Daily Shalhoub, Sherryll Burger, MD       heparin injection 5,000 Units  5,000 Units Subcutaneous Q8H Shalhoub, Sherryll Burger, MD   5,000 Units at 12/05/21 0530   levETIRAcetam (KEPPRA) IVPB 500 mg/100 mL premix  500 mg Intravenous Q12H Vernelle Emerald, MD 100 mL/hr at 12/04/21 2111 Restarted at 12/04/21 2111   ondansetron (ZOFRAN) tablet 4 mg  4 mg Oral Q6H PRN Vernelle Emerald, MD       Or   ondansetron [facility]) injection 4 mg  4 mg Intravenous Q6H PRN Shalhoub, Sherryll Burger, MD       pantoprazole (PROTONIX) injection 40 mg  40 mg Intravenous Q24H Vernelle Emerald, MD   40 mg at 12/05/21 0530   polyethylene glycol (MIRALAX / GLYCOLAX) packet 17 g  17 g Oral Daily PRN Shalhoub, Sherryll Burger, MD       sodium zirconium cyclosilicate (LOKELMA) packet 10 g  10 g Oral Once Reesa Chew, MD       tamsulosin [facility]) capsule  0.8 mg  0.8 mg Oral Daily Shalhoub, Sherryll Burger, MD   0.8 mg at 12/04/21 1244      Review of Systems: 10 systems reviewed and negative except per interval history/subjective  Physical Exam: Vitals:   12/05/21 0351 12/05/21 0818  BP: 127/64 128/80  Pulse: (!) 110 (!) 105  Resp: 20 20  Temp: 98.3 F (36.8 C) 98.8 F (37.1 C)  SpO2: 91% 95%   No intake/output data recorded.  Intake/Output Summary (Last 24 hours) at 12/05/2021 2919 Last data filed at 12/05/2021 0300 Gross per 24 hour  Intake 2419.27 ml  Output 1650 ml  Net 769.27 ml   Constitutional: chronically ill appearing no acute distress ENMT: ears and nose without scars or lesions, MMM CV: normal rate, LE edema improved 1+ bilaterally to knees Respiratory: clear to auscultation, normal work of breathing Gastrointestinal: soft, non-tender, no palpable masses or hernias Skin: no visible lesions or rashes Psych: alert, judgement/insight appropriate, appropriate mood and affect Neuro: Aox4, speech more fluent today, still has some difficulty maintaining attention   Test Results I personally reviewed new and old clinical labs and radiology tests Lab Results  Component Value Date   NA 139 12/05/2021   K 5.8 (H) 12/05/2021   CL 104 12/05/2021   CO2 24 12/05/2021   BUN 52 (H) 12/05/2021   CREATININE 6.69 (H) 12/05/2021   GFR 80.03 01/28/2019   CALCIUM 8.8 (L) 12/05/2021   ALBUMIN 2.1 (L) 12/05/2021   PHOS 4.5 12/05/2021

## 2021-12-05 NOTE — Progress Notes (Signed)
°  Transition of Care [facility]) Screening Note   Patient Details  Name: Bradley Colon Date of Birth: [birth year]   Transition of Care [facility]) CM/SW Contact:    Cyndi Bender, RN Phone Number: 12/05/2021, 8:37 AM    Transition of Care Department [facility]) has reviewed patient and no TOC needs have been identified at this time. We will continue to monitor patient advancement through interdisciplinary progression rounds. If new patient transition needs arise, please place a TOC consult.
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
## 2021-12-05 NOTE — Progress Notes (Signed)
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  He has been following with Drs. Sherril and [employer].  Patient is S/P low anterior resection with diverting loop ileostomy and ablation of liver metastasis 11/2019 and has received radiation as well as multiple courses of chemotherapy with the most recent dose of chemotherapy (FOLFIRI/panitumubmab) in 09/2021 with recent complications of small bowel obstruction requiring hospitalization at [facility] 12/10-12/10/2021 and most recently, development of urinary retention and overall incontinence having been seen by [facility] urology on 1/25 requiring intermittent catheterization. Patient was brought into [facility] emergency department via EMS after experiencing an episode of seizure-like activity.  He was found to be febrile in the ED, have a UTI, also have acute renal failure with hyperkalemia.  Subjective / 24h Interval events: -Feeling a little bit better this morning.  Overall frustrated about everything is going on  Assessment & Plan: * Severe sepsis ([place]) due to UTI / pyelonephritis- (present on admission) -likely due to UTI as well as Serratia bacteremia, he has been recently diagnosed with a urinary retention, had a cystoscopy 1/25 with [facility], he was instructed to self catheterize but apparently has not been doing this consistently.  He is septic on admission with fever, elevated WBC and a UA suggesting urinary tract infection.  He was placed on broad-spectrum antibiotics, continue.  ID consulted  Acute kidney injury (AKI) with acute tubular necrosis (ATN) ([place])- (present on admission) -likely obstructive in the setting of retention.  He has had a Foley catheter placed, continue.  Nephrology  consulted, appreciate input.  Kidney function improving today  Acute metabolic encephalopathy- (present on admission) -in the setting of sepsis, uremia.  Mental status clearing  Hyperkalemia- (present on admission) -due to AKI, [place]elma, calcium gluconate, dextrose and insulin. Nephrology following  Metabolic acidosis- (present on admission) -renal following, now resolved  Bacterial infection due to Serratia - ID following.  Has a port in place  Hypoglycemia- (present on admission) -due to sepsis  Grand mal seizure ([place])- (present on admission) -likely in the setting of febrile illness/sepsis.  Neurology consulted, appreciate input. EEG negative 2/1   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 [facility])- (present on admission) -outpatient management     Scheduled Meds:  Chlorhexidine Gluconate Cloth  6 each Topical Daily   heparin  5,000 Units Subcutaneous Q8H   pantoprazole (PROTONIX) IV  40 mg Intravenous Q24H   tamsulosin  0.8 mg Oral Daily   Continuous Infusions:  sodium chloride 125 mL/hr at 12/05/21 1107   ceFEPime (MAXIPIME) IV 1 g (12/04/21 2300)   levETIRAcetam 500 mg (12/05/21 1109)   PRN Meds:.acetaminophen **OR** acetaminophen, ondansetron **OR** ondansetron (ZOFRAN) IV, polyethylene glycol  Diet Orders (From admission, onward)     Start     Ordered   12/04/21 0092  Diet vegetarian Room service appropriate? Yes; Fluid consistency: Thin  Diet effective now       Question Answer Comment  Room service appropriate? Yes   Fluid consistency: Thin      12/04/21 0636            DVT prophylaxis: heparin injection 5,000 Units Start: 12/04/21 0600   Lab Results  Component Value Date   PLT 476 (H) 12/05/2021      Code Status: Full  Code  Family Communication: no family at bedside  Status is: Inpatient  Remains inpatient appropriate because: severity of illness  Level of care: Progressive  Consultants:  ID Nephrology  Microbiology  Blood cultures -  Serratia  Antimicrobials: Cefepime     Objective: Vitals:   12/04/21 2320 12/05/21 0351 12/05/21 0818 12/05/21 1205  BP: 120/73 127/64 128/80 131/74  Pulse: (!) 110 (!) 110 (!) 105 77  Resp: 17 20 20 17   Temp: 98 F (36.7 C) 98.3 F (36.8 C) 98.8 F (37.1 C) 98.4 F (36.9 C)  TempSrc: Oral Oral Oral Axillary  SpO2: 93% 91% 95% 97%  Weight:      Height:        Intake/Output Summary (Last 24 hours) at 12/05/2021 1426 Last data filed at 12/05/2021 0300 Gross per 24 hour  Intake 2319.27 ml  Output 1650 ml  Net 669.27 ml   Wt Readings from Last 3 Encounters:  12/04/21 87.7 kg  12/03/21 87.7 kg  10/07/21 86.6 kg    Examination:  Constitutional: NAD Eyes: no scleral icterus ENMT: Mucous membranes are moist.  Neck: normal, supple Respiratory: clear to auscultation bilaterally, no wheezing, no crackles. Normal respiratory effort.  Cardiovascular: Regular rate and rhythm, no murmurs / rubs / gallops. No LE edema.  Abdomen: non distended, no tenderness. Bowel sounds positive.  Musculoskeletal: no clubbing / cyanosis.  Skin: no rashes Neurologic: none    Data Reviewed: I have independently reviewed following labs and imaging studies  CBC Recent Labs  Lab 12/03/21 2228 12/03/21 2232 12/03/21 2327 12/05/21 0146  WBC 12.4*  --   --  16.7*  HGB 9.8* 10.9* 10.9* 8.8*  HCT 30.6* 32.0* 32.0* 27.0*  PLT 558*  --   --  476*  MCV 85.2  --   --  83.1  MCH 27.3  --   --  27.1  MCHC 32.0  --   --  32.6  RDW 15.4  --   --  15.3  LYMPHSABS 0.4*  --   --  0.7  MONOABS 0.2  --   --  1.1*  EOSABS 0.1  --   --  0.2  BASOSABS 0.0  --   --  0.0    Recent Labs  Lab 12/03/21 2228 12/03/21 2231 12/03/21 2232 12/03/21 2327 12/04/21 0123 12/04/21 0452 12/04/21 2020 12/05/21 0146  NA 131*  --  130* 130*  --  137 136 139  K >7.5*  --  7.5* 7.7*  --  6.2* 6.2* 5.8*  CL 97*  --   --  104  --  112* 101 104  CO2 18*  --   --   --   --  14* 21* 24  GLUCOSE 80  --   --  78  --   59* 125* 149*  BUN 89*  --   --  92*  --  71* 57* 52*  CREATININE 14.24*  --   --  14.90*  --  9.97* 7.92* 6.69*  CALCIUM 9.2  --   --   --   --  7.4* 9.2 8.8*  AST 22  --   --   --   --   --   --  26  ALT 18  --   --   --   --   --   --  14  ALKPHOS 72  --   --   --   --   --   --  77  BILITOT 0.7  --   --   --   --   --   --  0.5  ALBUMIN 2.9*  --   --   --   --  1.8* 2.6* 2.1*  MG  --   --   --   --   --   --   --  1.7  LATICACIDVEN  --  1.1  --   --  1.7  --   --   --   INR 1.2  --   --   --   --   --   --   --     ------------------------------------------------------------------------------------------------------------------ No results for input(s): CHOL, HDL, LDLCALC, TRIG, CHOLHDL, LDLDIRECT in the last 72 hours.  Lab Results  Component Value Date   HGBA1C 5.50 11/19/2015   ------------------------------------------------------------------------------------------------------------------ No results for input(s): TSH, T4TOTAL, T3FREE, THYROIDAB in the last 72 hours.  Invalid input(s): FREET3  Cardiac Enzymes No results for input(s): CKMB, TROPONINI, MYOGLOBIN in the last 168 hours.  Invalid input(s): CK ------------------------------------------------------------------------------------------------------------------ No results found for: BNP  CBG: Recent Labs  Lab 12/04/21 0602 12/04/21 0837 12/04/21 1140 12/04/21 1143 12/05/21 0042  GLUCAP 102* 95 30* 98 159*    Recent Results (from the past 240 hour(s))  Urine Culture     Status: Abnormal (Preliminary result)   Collection Time: 12/03/21  1:32 AM   Specimen: In/Out Cath Urine  Result Value Ref Range Status   Specimen Description IN/OUT CATH URINE  Final   Special Requests NONE  Final   Culture (A)  Final    >=100,000 COLONIES/mL SERRATIA MARCESCENS SUSCEPTIBILITIES TO FOLLOW Performed at [facility], [place] [address] [place], [place] [address]    Report Status PENDING  Incomplete  Culture, blood  (routine x 2)     Status: Abnormal (Preliminary result)   Collection Time: 12/03/21 10:31 PM   Specimen: BLOOD  Result Value Ref Range Status   Specimen Description BLOOD RIGHT ANTECUBITAL  Final   Special Requests   Final    BOTTLES DRAWN AEROBIC AND ANAEROBIC Blood Culture adequate volume   Culture  Setup Time (A)  Final    GRAM VARIABLE ROD IN BOTH AEROBIC AND ANAEROBIC BOTTLES CRITICAL RESULT CALLED TO, READ BACK BY AND VERIFIED WITH: PHARMD E MARTIN [id number] AT 1328 BY CM    Culture (A)  Final    SERRATIA MARCESCENS SUSCEPTIBILITIES TO FOLLOW Performed at [facility], [place] [address], [place], [place] [address]    Report Status PENDING  Incomplete  Blood Culture ID Panel (Reflexed)     Status: Abnormal   Collection Time: 12/03/21 10:31 PM  Result Value Ref Range Status   Enterococcus faecalis NOT DETECTED NOT DETECTED Final   Enterococcus Faecium NOT DETECTED NOT DETECTED Final   Listeria monocytogenes NOT DETECTED NOT DETECTED Final   Staphylococcus species NOT DETECTED NOT DETECTED Final   Staphylococcus aureus (BCID) NOT DETECTED NOT DETECTED Final   Staphylococcus epidermidis NOT DETECTED NOT DETECTED Final   Staphylococcus lugdunensis NOT DETECTED NOT DETECTED Final   Streptococcus species NOT DETECTED NOT DETECTED Final   Streptococcus agalactiae NOT DETECTED NOT DETECTED Final   Streptococcus pneumoniae NOT DETECTED NOT DETECTED Final   Streptococcus pyogenes NOT DETECTED NOT DETECTED Final   A.calcoaceticus-baumannii NOT DETECTED NOT DETECTED Final   Bacteroides fragilis NOT DETECTED NOT DETECTED Final   Enterobacterales DETECTED (A) NOT DETECTED Final    Comment: Enterobacterales represent a large order of gram negative bacteria,  not a single organism. CRITICAL RESULT CALLED TO, READ BACK BY AND VERIFIED WITH: PHARMD E MARTIN [id number] AT 1330 BY CM    Enterobacter cloacae complex NOT DETECTED NOT DETECTED Final   Escherichia coli NOT DETECTED NOT DETECTED Final    Klebsiella aerogenes NOT DETECTED NOT DETECTED Final   Klebsiella oxytoca NOT DETECTED NOT DETECTED Final   Klebsiella pneumoniae NOT DETECTED NOT DETECTED Final   Proteus species NOT DETECTED NOT DETECTED Final   Salmonella species NOT DETECTED NOT DETECTED Final   Serratia marcescens DETECTED (A) NOT DETECTED Final    Comment: CRITICAL RESULT CALLED TO, READ BACK BY AND VERIFIED WITH: PHARMD E MARTIN [id number] AT 1330 BY CM    Haemophilus influenzae NOT DETECTED NOT DETECTED Final   Neisseria meningitidis NOT DETECTED NOT DETECTED Final   Pseudomonas aeruginosa NOT DETECTED NOT DETECTED Final   Stenotrophomonas maltophilia NOT DETECTED NOT DETECTED Final   Candida albicans NOT DETECTED NOT DETECTED Final   Candida auris NOT DETECTED NOT DETECTED Final   Candida glabrata NOT DETECTED NOT DETECTED Final   Candida krusei NOT DETECTED NOT DETECTED Final   Candida parapsilosis NOT DETECTED NOT DETECTED Final   Candida tropicalis NOT DETECTED NOT DETECTED Final   Cryptococcus neoformans/gattii NOT DETECTED NOT DETECTED Final   CTX-M ESBL NOT DETECTED NOT DETECTED Final   Carbapenem resistance IMP NOT DETECTED NOT DETECTED Final   Carbapenem resistance KPC NOT DETECTED NOT DETECTED Final   Carbapenem resistance NDM NOT DETECTED NOT DETECTED Final   Carbapenem resist OXA 48 LIKE NOT DETECTED NOT DETECTED Final   Carbapenem resistance VIM NOT DETECTED NOT DETECTED Final    Comment: Performed at [facility], [address] [place], [place] [address]  Culture, blood (routine x 2)     Status: Abnormal (Preliminary result)   Collection Time: 12/03/21 10:36 PM   Specimen: BLOOD  Result Value Ref Range Status   Specimen Description BLOOD LEFT ANTECUBITAL  Final   Special Requests   Final    BOTTLES DRAWN AEROBIC AND ANAEROBIC Blood Culture adequate volume   Culture  Setup Time (A)  Final    GRAM VARIABLE ROD IN BOTH AEROBIC AND ANAEROBIC BOTTLES CRITICAL VALUE NOTED.  VALUE IS  CONSISTENT WITH PREVIOUSLY REPORTED AND CALLED VALUE. Performed at [facility], [place] [address], [place], [place] [address]    Culture SERRATIA MARCESCENS (A)  Final   Report Status PENDING  Incomplete  Resp Panel by RT-PCR (Flu A&B, Covid) Nasopharyngeal Swab     Status: None   Collection Time: 12/03/21 10:59 PM   Specimen: Nasopharyngeal Swab; Nasopharyngeal(NP) swabs in vial transport medium  Result Value Ref Range Status   SARS Coronavirus 2 by RT PCR NEGATIVE NEGATIVE Final    Comment: (NOTE) SARS-CoV-2 target nucleic acids are NOT DETECTED.  The SARS-CoV-2 RNA is generally detectable in upper respiratory specimens during the acute phase of infection. The lowest concentration of SARS-CoV-2 viral copies this assay can detect is 138 copies/mL. A negative result does not preclude SARS-Cov-2 infection and should not be used as the sole basis for treatment or other patient management decisions. A negative result may occur with  improper specimen collection/handling, submission of specimen other than nasopharyngeal swab, presence of viral mutation(s) within the areas targeted by this assay, and inadequate number of viral copies(<138 copies/mL). A negative result must be combined with clinical observations, patient history, and epidemiological information. The expected result is Negative.  Fact Sheet for Patients:  EntrepreneurPulse.com.au  Fact Sheet for Healthcare Providers:  IncredibleEmployment.be  This test is no t yet approved or cleared by the [place] FDA and  has been authorized for detection and/or diagnosis of SARS-CoV-2 by FDA under an Emergency Use Authorization (EUA). This EUA will remain  in effect (meaning this test can be used) for the duration of the COVID-19 declaration under Section 564(b)(1) of the Act, 21 U.S.C.section 360bbb-3(b)(1), unless the authorization is terminated  or revoked sooner.       Influenza A  by PCR NEGATIVE NEGATIVE Final   Influenza B by PCR NEGATIVE NEGATIVE Final    Comment: (NOTE) The Xpert Xpress SARS-CoV-2/FLU/RSV plus assay is intended as an aid in the diagnosis of influenza from Nasopharyngeal swab specimens and should not be used as a sole basis for treatment. Nasal washings and aspirates are unacceptable for Xpert Xpress SARS-CoV-2/FLU/RSV testing.  Fact Sheet for Patients: EntrepreneurPulse.com.au  Fact Sheet for Healthcare Providers: IncredibleEmployment.be  This test is not yet approved or cleared by the [place] FDA and has been authorized for detection and/or diagnosis of SARS-CoV-2 by FDA under an Emergency Use Authorization (EUA). This EUA will remain in effect (meaning this test can be used) for the duration of the COVID-19 declaration under Section 564(b)(1) of the Act, 21 U.S.C. section 360bbb-3(b)(1), unless the authorization is terminated or revoked.  Performed at [facility], [place] [address] [place], [place] [address]      Radiology Studies: MR BRAIN WO CONTRAST  Result Date: 12/05/2021 CLINICAL DATA:  51 year old male with metastatic rectal adenocarcinoma. Seizure. Query brain metastasis. EXAM: MRI HEAD WITHOUT CONTRAST TECHNIQUE: Multiplanar, multiecho pulse sequences of the brain and surrounding structures were obtained without intravenous contrast. COMPARISON:  Head CT 12/03/2021. FINDINGS: Brain: No contrast administered. No cerebral vasogenic edema or mass effect identified. No restricted diffusion to suggest acute infarction. No midline shift, ventriculomegaly, extra-axial collection or acute intracranial hemorrhage. Cervicomedullary junction and pituitary are within normal limits. On thin slice coronal imaging hippocampal formations and other mesial temporal lobe structures appear symmetric and within normal limits. Mild for age scattered nonspecific cerebral white matter T2 and FLAIR  hyperintensity, mostly periventricular and subcortical. No cortical encephalomalacia identified. No chronic cerebral blood products identified. Negative deep gray nuclei, brainstem and cerebellum. Vascular: Major intracranial vascular flow voids are preserved. Skull and upper cervical spine: Nonspecific decreased T1 marrow signal in the cervical spine beginning at C2. Skull bone marrow signal is within normal limits. Sinuses/Orbits: Mildly Disconjugate gaze, otherwise negative orbits. Mild paranasal sinus mucosal thickening. Other: Mastoids are well aerated. Visible internal auditory structures appear normal. Negative visible scalp and face. IMPRESSION: 1. No cerebral metastatic disease or acute intracranial abnormality is evident on this non-contrast exam. But note that early metastatic disease cannot be excluded. 2. Nonspecific decreased bone marrow signal in the visible cervical spine, perhaps treatment related red marrow reactivation rather than due to bony metastatic disease. Electronically Signed   By: Genevie Ann M.D.   On: 12/05/2021 11:16     Marzetta Board, MD, PhD [facility]  Between 7 am - 7 pm I am available, please contact me via Amion (for emergencies) or Securechat (non urgent messages)  Between 7 pm - 7 am I am not available, please contact night coverage MD/APP via Amion

## 2021-12-05 NOTE — Plan of Care (Signed)
Patient will benefit from IV hydration, intervention to bring down K+ and antibiotics.

## 2021-12-05 NOTE — Plan of Care (Signed)
Neurology consulted due to seizure like activity and concern of brain metastasis. Recommendations discussed with Dr. Theda Sers and Dr. Theda Sers is in agreement.  MRI w/o contrast shows no cerebral metastatic disease or acute intracranial abnormality is evident. Given patient's GFR 9, he would not be able to tolerate contrast.  2/1 EEG showed no seizures or epileptiform discharges.  Recommendations:  -Continue Keppra x 10 days -Continue electrolyte correction  No further inpatient neurology recommendations at this time. Signing off at this time. Please contact Neurology for any further questions or concerns.  -France Ravens, MD PGY1 Resident

## 2021-12-05 NOTE — Progress Notes (Addendum)
IP PROGRESS NOTE  Subjective:   [name] is well-known to me with a history of metastatic rectal cancer.  He has not been treated for rectal cancer since November 2022.  He has chronic perineal pain and irregular bowel/bladder habits since undergoing rectal radiation and surgery. I saw him earlier this week and he is scheduled to resume treatment of rectal cancer next week.  He appeared somnolent.  I thought this was potentially related to narcotic analgesics.  I recommended he discontinue narcotics. He had a seizure and presented to the [facility] emergency room and was found to have acute renal failure.  A Foley catheter was placed.  He has high urine output.  He complains of feeling like he needs to urinate and have a bowel movement.    Objective: Vital signs in last 24 hours: Blood pressure 127/64, pulse (!) 110, temperature 98.3 F (36.8 C), temperature source Oral, resp. rate 20, height 5' 7" (1.702 m), weight 193 lb 5.5 oz (87.7 kg), SpO2 91 %.  Intake/Output from previous day: 02/01 0701 - 02/02 0700 In: 2419.3 [I.V.:2119.3; IV Piggyback:300] Out: 6811 [Urine:1650]  Physical Exam:  HEENT: No thrush Lungs: Mild expiratory wheeze at the right posterior base, no respiratory distress Cardiac: Regular rate and rhythm Abdomen: Soft and nontender, no hepatosplenomegaly Extremities: Trace leg edema bilaterally Neurologic: Somnolent, arousable, follows commands, moves all extremities, has myoclonic jerks  Portacath/PICC-without erythema  Lab Results: Recent Labs    12/03/21 2228 12/03/21 2232 12/03/21 2327 12/05/21 0146  WBC 12.4*  --   --  16.7*  HGB 9.8*   < > 10.9* 8.8*  HCT 30.6*   < > 32.0* 27.0*  PLT 558*  --   --  476*   < > = values in this interval not displayed.    BMET Recent Labs    12/04/21 2020 12/05/21 0146  NA 136 139  K 6.2* 5.8*  CL 101 104  CO2 21* 24  GLUCOSE 125* 149*  BUN 57* 52*  CREATININE 7.92* 6.69*  CALCIUM 9.2 8.8*    Lab Results   Component Value Date   CEA1 59.33 (H) 08/12/2021   CEA 37.59 (H) 10/07/2021    Studies/Results: CT ABDOMEN PELVIS WO CONTRAST  Result Date: 12/04/2021 CLINICAL DATA:  Kidney failure, acute AKI. EXAM: CT ABDOMEN AND PELVIS WITHOUT CONTRAST TECHNIQUE: Multidetector CT imaging of the abdomen and pelvis was performed following the standard protocol without IV contrast. RADIATION DOSE REDUCTION: This exam was performed according to the departmental dose-optimization program which includes automated exposure control, adjustment of the mA and/or kV according to patient size and/or use of iterative reconstruction technique. COMPARISON:  10/10/2021. FINDINGS: Lower chest: The heart is enlarged. The distal tip of a central venous catheter terminates in the right atrium. Multiple nodules are present at the lung bases, the largest in the right lower lobe measuring 3.1 cm, increased in size and number as compared with the prior exam. A cavitary nodule is present in the left lower lobe. Hepatobiliary: No focal liver abnormality is seen. No gallstones, gallbladder wall thickening, or biliary dilatation. Pancreas: Pancreatic atrophy is noted. No pancreatic ductal dilatation or surrounding inflammatory changes. Spleen: Normal in size without focal abnormality. Adrenals/Urinary Tract: The adrenal glands are within normal limits. No renal calculus is seen bilaterally. The collecting systems are prominent bilaterally with no evidence of ureteral calculus. Mild perinephric fat stranding is noted bilaterally. The urinary bladder is distended and perivesicular fat stranding is noted. Stomach/Bowel: The stomach is within normal  limits. No bowel obstruction, free air, or pneumatosis. There is a periumbilical hernia and umbilical hernia containing nonobstructed small bowel. The appendix is not visualized on exam. No focal bowel wall thickening. Vascular/Lymphatic: Aortic atherosclerosis. No enlarged abdominal or pelvic lymph nodes.  Reproductive: The prostate gland is not well seen on exam and may be small. Other: No free fluid. Fat containing inguinal hernias are present bilaterally. Musculoskeletal: No acute or suspicious osseous abnormality. IMPRESSION: 1. Mild prominence of the renal collecting systems bilaterally with perinephric a periureteral fat stranding. No ureteral calculus is identified. Findings may be infectious or inflammatory. 2. The urinary bladder is distended with perivesicular fat stranding, possible infectious or inflammatory cystitis. 3. Increased size and number of pulmonary nodules at the lung bases, compatible with known metastatic disease. 4. Aortic atherosclerosis. 5. Remaining chronic findings as described above. Electronically Signed   By: Brett Fairy M.D.   On: 12/04/2021 00:49   CT Head Wo Contrast  Result Date: 12/03/2021 CLINICAL DATA:  New onset seizure. EXAM: CT HEAD WITHOUT CONTRAST TECHNIQUE: Contiguous axial images were obtained from the base of the skull through the vertex without intravenous contrast. RADIATION DOSE REDUCTION: This exam was performed according to the departmental dose-optimization program which includes automated exposure control, adjustment of the mA and/or kV according to patient size and/or use of iterative reconstruction technique. COMPARISON:  CT head 08/17/2010. FINDINGS: Brain: No evidence of acute infarction, hemorrhage, hydrocephalus, extra-axial collection or mass lesion/mass effect. Vascular: No hyperdense vessel or unexpected calcification. Skull: Normal. Negative for fracture or focal lesion. Sinuses/Orbits: No acute finding. Other: None. IMPRESSION: No acute intracranial abnormality. Electronically Signed   By: Ronney Asters M.D.   On: 12/03/2021 23:54   DG Chest Port 1 View  Result Date: 12/03/2021 CLINICAL DATA:  Shortness of breath, seizure. EXAM: PORTABLE CHEST 1 VIEW COMPARISON:  Chest x-ray 07/18/2021. CT chest 07/17/2021. FINDINGS: Right chest port catheter  tip projects over the cavoatrial junction, unchanged. Lung volumes are low. Bilateral pulmonary nodules are grossly unchanged. Airspace disease has increased in the right lower lung. There is no definite pleural effusion or pneumothorax. No acute fractures are seen. The heart is enlarged, unchanged. IMPRESSION: 1. Bilateral pulmonary nodules are grossly unchanged. 2. New patchy airspace disease in the right lower lung worrisome for infection. Followup PA and lateral chest X-ray is recommended in 3-4 weeks following trial of antibiotic therapy to ensure resolution and exclude underlying malignancy. Electronically Signed   By: Ronney Asters M.D.   On: 12/03/2021 23:17   EEG adult  Result Date: 12/04/2021 Lora Havens, MD     12/04/2021 10:21 AM Patient Name: Bradley Colon MRN: [id number] Epilepsy Attending: Lora Havens Referring Physician/Provider: Donnetta Simpers, MD Date: 12/04/2021 Duration: 22.34 mins Patient history:  51 y.o. male with PMH significant for metastatic rectal cancer with pulm mets and multiple rounds of chemo and s/p radiation who presented to the ED with first seizure in the setting of significant electrolyte derangement from potential obstructive uropathy, urosepsis, AKI, uremia and hyperkalemia. EEG to evaluate for seizure Level of alertness: Awake, drowsy AEDs during EEG study: None Technical aspects: This EEG study was done with scalp electrodes positioned according to the 10-20 International system of electrode placement. Electrical activity was acquired at a sampling rate of 500Hz and reviewed with a high frequency filter of 70Hz and a low frequency filter of 1Hz. EEG data were recorded continuously and digitally stored. Description: The posterior dominant rhythm consists of 7.5 Hz activity of moderate voltage (  25-35 uV) seen predominantly in posterior head regions, symmetric and reactive to eye opening and eye closing. Drowsiness was characterized by attenuation of the posterior  background rhythm.  Sharp transients were noted in left occipital region.  Hyperventilation and photic stimulation were not performed.   IMPRESSION: This study is within normal limits. No seizures or epileptiform discharges were seen throughout the recording. Priyanka O Yadav   VAS [place] LOWER EXTREMITY VENOUS (DVT)  Result Date: 12/04/2021  Lower Venous DVT Study Patient Name:  Bradley Colon  Date of Exam:   12/04/2021 Medical Rec #: [id number]   Accession #:    [id number] Date of Birth: 1970/12/21  Patient Gender: M Patient Age:   51 years Exam Location:  [facility] Procedure:      VAS [place] LOWER EXTREMITY VENOUS (DVT) Referring Phys: Marzetta Board --------------------------------------------------------------------------------  Indications: Swelling, and Edema.  Comparison Study: 03/21/21 prior Performing Technologist: Archie Patten RVS  Examination Guidelines: A complete evaluation includes B-mode imaging, spectral Doppler, color Doppler, and power Doppler as needed of all accessible portions of each vessel. Bilateral testing is considered an integral part of a complete examination. Limited examinations for reoccurring indications may be performed as noted. The reflux portion of the exam is performed with the patient in reverse Trendelenburg.  +---------+---------------+---------+-----------+----------+--------------+  RIGHT     Compressibility Phasicity Spontaneity Properties Thrombus Aging  +---------+---------------+---------+-----------+----------+--------------+  CFV       Full            Yes       Yes                                    +---------+---------------+---------+-----------+----------+--------------+  SFJ       Full                                                             +---------+---------------+---------+-----------+----------+--------------+  FV Prox   Full                                                              +---------+---------------+---------+-----------+----------+--------------+  FV Mid    Full                                                             +---------+---------------+---------+-----------+----------+--------------+  FV Distal Full                                                             +---------+---------------+---------+-----------+----------+--------------+  PFV       Full                                                             +---------+---------------+---------+-----------+----------+--------------+  POP       Full            Yes       Yes                                    +---------+---------------+---------+-----------+----------+--------------+  PTV       Full                                                             +---------+---------------+---------+-----------+----------+--------------+  PERO      Full                                                             +---------+---------------+---------+-----------+----------+--------------+   +---------+---------------+---------+-----------+----------+--------------+  LEFT      Compressibility Phasicity Spontaneity Properties Thrombus Aging  +---------+---------------+---------+-----------+----------+--------------+  CFV       Full            Yes       Yes                                    +---------+---------------+---------+-----------+----------+--------------+  SFJ       Full                                                             +---------+---------------+---------+-----------+----------+--------------+  FV Prox   Full                                                             +---------+---------------+---------+-----------+----------+--------------+  FV Mid    Full                                                             +---------+---------------+---------+-----------+----------+--------------+  FV Distal Full                                                              +---------+---------------+---------+-----------+----------+--------------+  PFV       Full                                                             +---------+---------------+---------+-----------+----------+--------------+  POP       Full            Yes       Yes                                    +---------+---------------+---------+-----------+----------+--------------+  PTV       Full                                                             +---------+---------------+---------+-----------+----------+--------------+  PERO      Full                                                             +---------+---------------+---------+-----------+----------+--------------+     Summary: BILATERAL: - No evidence of deep vein thrombosis seen in the lower extremities, bilaterally. -No evidence of popliteal cyst, bilaterally.   *See table(s) above for measurements and observations. Electronically signed by Jamelle Haring on 12/04/2021 at 3:34:12 PM.    Final     Medications: I have reviewed the patient's current medications.  Assessment/Plan: Rectal cancer, clinical stage III (T3cN2), Foundation 1 on lymph node 11/18/2019-tumor mutation burden 1, FG R1 amplification, K-ras wild-type, microsatellite status could not be determined Rectal mass extending from 4-9 cm on colonoscopy 02/15/2019 with a biopsy confirming invasive adenocarcinoma CTs 02/23/2019- thickening of the low rectum without a discrete mass, small bilateral pulmonary nodules, some present on imaging from 2014 Pelvic MRI 02/25/2019- T3cN2 tumor beginning at 4.9 cm from the anal verge and 0 cm from the internal anal sphincter, tumor extends through the muscularis propria, 2.  Rectal lymph nodes and 2 enlarged right internal iliac nodes CT[facility] at [facility] 04/19/2019- new subcentimeter right hepatic lesion concerning for metastasis, stable low rectal mass with prominent mesorectal lymph nodes, stable pathologic enlarged right internal iliac nodes, stable pulmonary  nodules Xeloda/radiation beginning 04/26/2019, completed 07/05/2019 CT 07/26/2019-growth of previously described right hepatic lesion, slight decrease in pelvic lymphadenopathy and rectal thickening 08/05/2019-week 1 bolus 5-FU/leucovorin (200 mg/m) 08/12/2019-week to bolus 5-FU/leucovorin (400 mg/m) 10/16/ 2020-week 3 bolus 5-FU/leucovorin (400 mg/m) and oxaliplatin (42.5 mg/m) CT chest 10/04/2019-slight increase in bilateral pulmonary nodules concerning for metastases MRI abdomen 10/06/2019-right hepatic lesion consistent with metastasis, wall thickening of the low anterior rectum, no new or enlarging adenopathy, stable mesorectal and right internal iliac nodes PET scan 10/14/2019-pulmonary nodules below PET resolution, hypermetabolic right posterior hepatic lesion, mildly hypermetabolic partially calcified right internal iliac chain lymphadenopathy, mild gastric hypermetabolism and thickening-likely gastritis 11/18/2019-LAR with diverting loop ileostomy and ablation of solitary liver metastasis (2 cm segment 7), moderately differentiated adenocarcinoma invading into soft tissue, LVI present, perineural invasion present, 5/15 lymph nodes positive, 4 tumor deposits, absent treatment effect-score 3,ypT3 ypN2a, mismatch repair protein expression intact, MSS CT chest 01/16/2020-increase in the size of a dominant right lower lobe nodule, unchanged left upper lobe and left lower lobe nodules CT chest 04/11/2020-enlargement of bilateral lung nodules, and new nodules CT chest 09/11/2020-enlargement of bilateral lung nodules, aggressive medial right liver lesion Cycle 1 FOLFOX 10/31/2020 Cycle  2 FOLFOX 11/14/2020 Cycle 3 FOLFOX 11/29/2020 CT chest 01/03/2021-multiple bilateral lung nodules, stable to slightly decreased in size, no new lesions, hepatic dome lesion is smaller (ablated lesion?) Cycle 4 FOLFOX 01/21/2021 CTs 07/02/2021-increase in size and number of pulmonary metastases, new mediastinal lymph node,  increased right hepatic lobe metastasis Targeted bronchoscopy of right lower lobe lesion 07/18/2021-brushing revealed malignant cells consistent with adenocarcinoma Cycle 1 FOLFIRI/panitumumab 08/12/2021 Cycle 2 FOLFIRI/panitumumab 09/11/2021 CT abdomen/pelvis 10/10/2021-small bowel obstruction at loop of bowel in umbilical hernia, slight enlargement of bilateral pulmonary metastatic lesions compared to 07/02/2021, Pain secondary to #1, improved Reactive airways disease/asthma Report of multiple allergens Port-A-Cath related DVT, right IJ, 09/30/2019-apixaban, converted to Lovenox surrounding surgery January 2021, changed back to apixaban 12/30/2019 Erectile dysfunction following treatment for rectal cancer-followed by urology Rectal/perineal pain following the low anterior resection-persistent, followed by Dr. Hilma Favors 8.   Admission to [facility] 10/12/2021 with clinical/radiologic evidence of a small bowel obstruction, NG tube placed, diet advanced and discharged home 10/14/2021 9.   Urinary retention/overflow incontinence-evaluated at [facility] 11/27/2021, performs self-catheterization twice daily 10.  Admission 12/03/2021 with acute renal failure, most likely secondary to obstructive nephropathy 11.  Altered mental status secondary to renal failure, narcotic analgesics, and seizures 12.  Anemia secondary to chronic disease, chronic renal failure, phlebotomy, and potentially GU/GI bleeding   Mr. Bradley Colon has metastatic rectal cancer.  He has not been treated with systemic therapy since November 2022.  He has chronic perineal pain following the rectal surgery and radiation.  He is followed by Dr. Hilma Favors for pain management.  He is now admitted with acute renal failure, likely related to obstructive nephropathy.  His creatinine was normal at [facility] in December.  He is being followed by the nephrology service.  Hopefully the renal function will continue to improve.  He appears to have urosepsis with positive urine and  blood cultures.  He remains somnolent, potentially related to uremia, recent benzodiazepine therapy, and narcotics.  I discussed the case with his mother by telephone yesterday.  Recommendations: Management of acute renal failure per the nephrology and medicine services Antibiotics follow-up ID and sensitivity of gram-negative rod Monitor for evidence of narcotic withdrawal, Dr. Hilma Favors has followed him for pain management Outpatient follow-up will be scheduled at the Cancer center Please call oncology as needed, I will be out until 12/10/2020          LOS: 1 day   Betsy Coder, MD   12/05/2021, 8:01 AM

## 2021-12-05 NOTE — Assessment & Plan Note (Signed)
-   ID following.  Has a port in place

## 2021-12-06 DIAGNOSIS — R6520 Severe sepsis without septic shock: Secondary | ICD-10-CM | POA: Diagnosis not present

## 2021-12-06 DIAGNOSIS — A419 Sepsis, unspecified organism: Secondary | ICD-10-CM | POA: Diagnosis not present

## 2021-12-06 LAB — MAGNESIUM: Magnesium: 1.4 mg/dL — ABNORMAL LOW (ref 1.7–2.4)

## 2021-12-06 LAB — BLOOD CULTURE ID PANEL (REFLEXED) - BCID2

## 2021-12-06 LAB — COMPREHENSIVE METABOLIC PANEL
ALT: 21 U/L (ref 0–44)
AST: 33 U/L (ref 15–41)
Albumin: 2.1 g/dL — ABNORMAL LOW (ref 3.5–5.0)
Alkaline Phosphatase: 96 U/L (ref 38–126)
Anion gap: 9 (ref 5–15)
BUN: 28 mg/dL — ABNORMAL HIGH (ref 6–20)
CO2: 25 mmol/L (ref 22–32)
Calcium: 8.6 mg/dL — ABNORMAL LOW (ref 8.9–10.3)
Chloride: 105 mmol/L (ref 98–111)
Creatinine, Ser: 3.62 mg/dL — ABNORMAL HIGH (ref 0.61–1.24)
GFR, Estimated: 19 mL/min — ABNORMAL LOW (ref >60)
Glucose, Bld: 107 mg/dL — ABNORMAL HIGH (ref 70–99)
Potassium: 4.5 mmol/L (ref 3.5–5.1)
Sodium: 139 mmol/L (ref 135–145)
Total Bilirubin: 0.4 mg/dL (ref 0.3–1.2)
Total Protein: 6 g/dL — ABNORMAL LOW (ref 6.5–8.1)

## 2021-12-06 LAB — CBC
HCT: 26.4 % — ABNORMAL LOW (ref 39.0–52.0)
Hemoglobin: 8.4 g/dL — ABNORMAL LOW (ref 13.0–17.0)
MCH: 26.8 pg (ref 26.0–34.0)
MCHC: 31.8 g/dL (ref 30.0–36.0)
MCV: 84.3 fL (ref 80.0–100.0)
Platelets: 482 10*3/uL — ABNORMAL HIGH (ref 150–400)
RBC: 3.13 MIL/uL — ABNORMAL LOW (ref 4.22–5.81)
RDW: 15.5 % (ref 11.5–15.5)
WBC: 15.1 10*3/uL — ABNORMAL HIGH (ref 4.0–10.5)
nRBC: 0 % (ref 0.0–0.2)

## 2021-12-06 LAB — GLUCOSE, CAPILLARY
Glucose-Capillary: 113 mg/dL — ABNORMAL HIGH (ref 70–99)
Glucose-Capillary: 99 mg/dL (ref 70–99)
Glucose-Capillary: 104 mg/dL — ABNORMAL HIGH (ref 70–99)
Glucose-Capillary: 91 mg/dL (ref 70–99)
Glucose-Capillary: 129 mg/dL — ABNORMAL HIGH (ref 70–99)
Glucose-Capillary: 88 mg/dL (ref 70–99)
Glucose-Capillary: 76 mg/dL (ref 70–99)

## 2021-12-06 LAB — URINE CULTURE: Culture: >=100000 — AB

## 2021-12-06 LAB — PHOSPHORUS: Phosphorus: 3.4 mg/dL (ref 2.5–4.6)

## 2021-12-06 MED ORDER — SODIUM CHLORIDE 0.9 % IV SOLN
2.0000 g | INTRAVENOUS | Status: DC
  Administered 2021-12-07 – 2021-12-08 (×2): 2 g via INTRAVENOUS
  Filled 2021-12-06 (×2): qty 2

## 2021-12-06 MED ORDER — MAGNESIUM SULFATE 2 GM/50ML IV SOLN
2.0000 g | Freq: Once | INTRAVENOUS | Status: AC
  Administered 2021-12-06: 2 g via INTRAVENOUS
  Filled 2021-12-06: qty 50

## 2021-12-06 MED ORDER — OXYCODONE HCL 5 MG PO TABS
10.0000 mg | ORAL_TABLET | ORAL | Status: DC | PRN
Start: 2021-12-06 — End: 2021-12-08
  Administered 2021-12-06 – 2021-12-08 (×10): 10 mg via ORAL
  Filled 2021-12-06 (×11): qty 2

## 2021-12-06 MED ORDER — ALPRAZOLAM 0.5 MG PO TABS
0.2500 mg | ORAL_TABLET | Freq: Once | ORAL | Status: AC
  Administered 2021-12-06: 0.25 mg via ORAL
  Filled 2021-12-06: qty 1

## 2021-12-06 MED ORDER — OXYCODONE HCL 5 MG PO TABS
10.0000 mg | ORAL_TABLET | Freq: Once | ORAL | Status: AC
  Administered 2021-12-06: 10 mg via ORAL
  Filled 2021-12-06: qty 2

## 2021-12-06 MED ORDER — SODIUM CHLORIDE 0.9 % IV SOLN
1.0000 g | Freq: Once | INTRAVENOUS | Status: AC
  Administered 2021-12-06: 1 g via INTRAVENOUS
  Filled 2021-12-06: qty 1

## 2021-12-06 NOTE — Progress Notes (Signed)
[facility]  Date of Admission:  12/03/2021     Antibiotic: Cefepime day 3  ASSESSMENT: Patient is afebrile overnight, WBC continues to trending down. Kidney function is improving nicely. Serratia susceptibility came back showed resistant to cefazolin and nitrofurantoin. Will continue IV Cefpime for 7 days. It seems like he did not get a doses of Cefepime yesterday.   PLAN: Continue Cefepime, end date 12/10/20.  If he gets discharged before finishing his course, he can receive his IV antibiotics through the port at home.   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)   Hyperkalemia   Acute kidney injury (AKI) with acute tubular necrosis (ATN) (HCC)   Acute metabolic encephalopathy   Grand mal seizure ([place])   Acute bacterial pyelonephritis   Metabolic acidosis   Hypoglycemia   Bacterial infection due to Serratia   Scheduled Meds:  Chlorhexidine Gluconate Cloth  6 each Topical Daily   heparin  5,000 Units Subcutaneous Q8H   pantoprazole (PROTONIX) IV  40 mg Intravenous Q24H   tamsulosin  0.8 mg Oral Daily   Continuous Infusions:  ceFEPime (MAXIPIME) IV 1 g (12/06/21 0542)   levETIRAcetam 500 mg (12/06/21 0829)   PRN Meds:.acetaminophen **OR** acetaminophen, ondansetron **OR** ondansetron (ZOFRAN) IV, oxyCODONE, polyethylene glycol   SUBJECTIVE: Patient is seen at bedside.  He reports crampings of his lower extremities.  Otherwise no other issue.  He would like to go home.  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   Other Shortness Of Breath and Itching    Reaction to cut grass, pet dander   Contrast Media [Iodinated Contrast Media] Hives   Shrimp [Shellfish Allergy] Swelling and Other (See  Comments)    Muscle cramps   Vancomycin Itching    Reaction to IV - pt states no reaction if it is infused slowly   Wheat Bran Swelling    Some swelling   Latex Hives and Rash    Reaction to fumes from latex paint    OBJECTIVE: Vitals:   12/05/21 1943 12/05/21 2000 12/06/21 0535 12/06/21 0819  BP: (!) 154/94 (!) 113/103 (!) 165/84 (!) 150/75  Pulse: 100 93 77 (!) 58  Resp: 17 17 (!) 21 15  Temp: 99.2 F (37.3 C)  99.2 F (37.3 C)   TempSrc: Oral  Oral   SpO2: 96% 95% 95% 94%  Weight:      Height:       Body mass index is 30.28 kg/m.  Physical Exam Constitutional:      General: He is in acute distress (Due to lower extremities cramping).  Eyes:     General:        Right eye: No discharge.        Left eye: No discharge.     Conjunctiva/sclera: Conjunctivae normal.  Cardiovascular:     Rate and Rhythm: Normal rate and regular rhythm.  Pulmonary:     Effort: Pulmonary effort is normal. No respiratory distress.  Skin:    General: Skin is warm.  Neurological:     Mental Status: He is alert.  Psychiatric:        Mood and Affect: Mood normal.  Behavior: Behavior normal.    Lab Results Lab Results  Component Value Date   WBC 15.1 (H) 12/06/2021   HGB 8.4 (L) 12/06/2021   HCT 26.4 (L) 12/06/2021   MCV 84.3 12/06/2021   PLT 482 (H) 12/06/2021    Lab Results  Component Value Date   CREATININE 3.62 (H) 12/06/2021   BUN 28 (H) 12/06/2021   NA 139 12/06/2021   K 4.5 12/06/2021   CL 105 12/06/2021   CO2 25 12/06/2021    Lab Results  Component Value Date   ALT 21 12/06/2021   AST 33 12/06/2021   ALKPHOS 96 12/06/2021   BILITOT 0.4 12/06/2021     Microbiology: Recent Results (from the past 240 hour(s))  Urine Culture     Status: Abnormal   Collection Time: 12/03/21  1:32 AM   Specimen: In/Out Cath Urine  Result Value Ref Range Status   Specimen Description IN/OUT CATH URINE  Final   Special Requests   Final    NONE Performed at [facility], [address] [place], [place] [address]    Culture >=100,000 COLONIES/mL SERRATIA MARCESCENS (A)  Final   Report Status 12/06/2021 FINAL  Final   Organism ID, Bacteria SERRATIA MARCESCENS (A)  Final      Susceptibility   Serratia marcescens - MIC*    CEFAZOLIN >=64 RESISTANT Resistant     CEFEPIME <=0.12 SENSITIVE Sensitive     CEFTRIAXONE <=0.25 SENSITIVE Sensitive     CIPROFLOXACIN <=0.25 SENSITIVE Sensitive     GENTAMICIN <=1 SENSITIVE Sensitive     NITROFURANTOIN 256 RESISTANT Resistant     TRIMETH/SULFA <=20 SENSITIVE Sensitive     * >=100,000 COLONIES/mL SERRATIA MARCESCENS  Culture, blood (routine x 2)     Status: Abnormal (Preliminary result)   Collection Time: 12/03/21 10:31 PM   Specimen: BLOOD  Result Value Ref Range Status   Specimen Description BLOOD RIGHT ANTECUBITAL  Final   Special Requests   Final    BOTTLES DRAWN AEROBIC AND ANAEROBIC Blood Culture adequate volume   Culture  Setup Time (A)  Final    GRAM VARIABLE ROD IN BOTH AEROBIC AND ANAEROBIC BOTTLES CRITICAL RESULT CALLED TO, READ BACK BY AND VERIFIED WITH: PHARMD E MARTIN [id number] AT 1328 BY CM    Culture (A)  Final    SERRATIA MARCESCENS SUSCEPTIBILITIES TO FOLLOW Performed at [facility], [place] [address] [place], [place] [address]    Report Status PENDING  Incomplete  Blood Culture ID Panel (Reflexed)     Status: Abnormal   Collection Time: 12/03/21 10:31 PM  Result Value Ref Range Status   Enterococcus faecalis NOT DETECTED NOT DETECTED Final   Enterococcus Faecium NOT DETECTED NOT DETECTED Final   Listeria monocytogenes NOT DETECTED NOT DETECTED Final   Staphylococcus species NOT DETECTED NOT DETECTED Final   Staphylococcus aureus (BCID) NOT DETECTED NOT DETECTED Final   Staphylococcus epidermidis NOT DETECTED NOT DETECTED Final   Staphylococcus lugdunensis NOT DETECTED NOT DETECTED Final   Streptococcus species NOT DETECTED NOT DETECTED Final   Streptococcus agalactiae NOT  DETECTED NOT DETECTED Final   Streptococcus pneumoniae NOT DETECTED NOT DETECTED Final   Streptococcus pyogenes NOT DETECTED NOT DETECTED Final   A.calcoaceticus-baumannii NOT DETECTED NOT DETECTED Final   Bacteroides fragilis NOT DETECTED NOT DETECTED Final   Enterobacterales DETECTED (A) NOT DETECTED Final    Comment: Enterobacterales represent a large order of gram negative bacteria, not a single organism. CRITICAL RESULT CALLED TO, READ  BACK BY AND VERIFIED WITH: PHARMD E MARTIN [id number] AT 1330 BY CM    Enterobacter cloacae complex NOT DETECTED NOT DETECTED Final   Escherichia coli NOT DETECTED NOT DETECTED Final   Klebsiella aerogenes NOT DETECTED NOT DETECTED Final   Klebsiella oxytoca NOT DETECTED NOT DETECTED Final   Klebsiella pneumoniae NOT DETECTED NOT DETECTED Final   Proteus species NOT DETECTED NOT DETECTED Final   Salmonella species NOT DETECTED NOT DETECTED Final   Serratia marcescens DETECTED (A) NOT DETECTED Final    Comment: CRITICAL RESULT CALLED TO, READ BACK BY AND VERIFIED WITH: PHARMD E MARTIN [id number] AT 1330 BY CM    Haemophilus influenzae NOT DETECTED NOT DETECTED Final   Neisseria meningitidis NOT DETECTED NOT DETECTED Final   Pseudomonas aeruginosa NOT DETECTED NOT DETECTED Final   Stenotrophomonas maltophilia NOT DETECTED NOT DETECTED Final   Candida albicans NOT DETECTED NOT DETECTED Final   Candida auris NOT DETECTED NOT DETECTED Final   Candida glabrata NOT DETECTED NOT DETECTED Final   Candida krusei NOT DETECTED NOT DETECTED Final   Candida parapsilosis NOT DETECTED NOT DETECTED Final   Candida tropicalis NOT DETECTED NOT DETECTED Final   Cryptococcus neoformans/gattii NOT DETECTED NOT DETECTED Final   CTX-M ESBL NOT DETECTED NOT DETECTED Final   Carbapenem resistance IMP NOT DETECTED NOT DETECTED Final   Carbapenem resistance KPC NOT DETECTED NOT DETECTED Final   Carbapenem resistance NDM NOT DETECTED NOT DETECTED Final   Carbapenem resist OXA 48  LIKE NOT DETECTED NOT DETECTED Final   Carbapenem resistance VIM NOT DETECTED NOT DETECTED Final    Comment: Performed at [facility], [address] [place], [place] [address]  Culture, blood (routine x 2)     Status: Abnormal (Preliminary result)   Collection Time: 12/03/21 10:36 PM   Specimen: BLOOD  Result Value Ref Range Status   Specimen Description BLOOD LEFT ANTECUBITAL  Final   Special Requests   Final    BOTTLES DRAWN AEROBIC AND ANAEROBIC Blood Culture adequate volume   Culture  Setup Time (A)  Final    GRAM VARIABLE ROD IN BOTH AEROBIC AND ANAEROBIC BOTTLES CRITICAL VALUE NOTED.  VALUE IS CONSISTENT WITH PREVIOUSLY REPORTED AND CALLED VALUE. Performed at [facility], [place] [address] [place],  [address]    Culture SERRATIA MARCESCENS (A)  Final   Report Status PENDING  Incomplete  Resp Panel by RT-PCR (Flu A&B, Covid) Nasopharyngeal Swab     Status: None   Collection Time: 12/03/21 10:59 PM   Specimen: Nasopharyngeal Swab; Nasopharyngeal(NP) swabs in vial transport medium  Result Value Ref Range Status   SARS Coronavirus 2 by RT PCR NEGATIVE NEGATIVE Final    Comment: (NOTE) SARS-CoV-2 target nucleic acids are NOT DETECTED.  The SARS-CoV-2 RNA is generally detectable in upper respiratory specimens during the acute phase of infection. The lowest concentration of SARS-CoV-2 viral copies this assay can detect is 138 copies/mL. A negative result does not preclude SARS-Cov-2 infection and should not be used as the sole basis for treatment or other patient management decisions. A negative result may occur with  improper specimen collection/handling, submission of specimen other than nasopharyngeal swab, presence of viral mutation(s) within the areas targeted by this assay, and inadequate number of viral copies(<138 copies/mL). A negative result must be combined with clinical observations, patient history, and epidemiological information. The expected  result is Negative.  Fact Sheet for Patients:  EntrepreneurPulse.com.au  Fact Sheet for Healthcare Providers:  IncredibleEmployment.be  This  test is no t yet approved or cleared by the [place] and  has been authorized for detection and/or diagnosis of SARS-CoV-2 by FDA under an Emergency Use Authorization (EUA). This EUA will remain  in effect (meaning this test can be used) for the duration of the COVID-19 declaration under Section 564(b)(1) of the Act, 21 U.S.C.section 360bbb-3(b)(1), unless the authorization is terminated  or revoked sooner.       Influenza A by PCR NEGATIVE NEGATIVE Final   Influenza B by PCR NEGATIVE NEGATIVE Final    Comment: (NOTE) The Xpert Xpress SARS-CoV-2/FLU/RSV plus assay is intended as an aid in the diagnosis of influenza from Nasopharyngeal swab specimens and should not be used as a sole basis for treatment. Nasal washings and aspirates are unacceptable for Xpert Xpress SARS-CoV-2/FLU/RSV testing.  Fact Sheet for Patients: EntrepreneurPulse.com.au  Fact Sheet for Healthcare Providers: IncredibleEmployment.be  This test is not yet approved or cleared by the [place] FDA and has been authorized for detection and/or diagnosis of SARS-CoV-2 by FDA under an Emergency Use Authorization (EUA). This EUA will remain in effect (meaning this test can be used) for the duration of the COVID-19 declaration under Section 564(b)(1) of the Act, 21 U.S.C. section 360bbb-3(b)(1), unless the authorization is terminated or revoked.  Performed at [facility], [place] [address], [place], [place] [address]     Gaylan Gerold, [facility] [phone] pager    12/06/2021, 8:37 AM

## 2021-12-06 NOTE — Plan of Care (Signed)

## 2021-12-06 NOTE — Progress Notes (Signed)
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  He has been following with Drs. [facility] Colon and [facility].  Patient is S/P low anterior resection with diverting loop ileostomy and ablation of liver metastasis 11/2019 and has received radiation as well as multiple courses of chemotherapy with the most recent dose of chemotherapy (FOLFIRI/panitumubmab) in 09/2021 with recent complications of small bowel obstruction requiring hospitalization at [facility] 12/10-12/10/2021 and most recently, development of urinary retention and overall incontinence having been seen by [facility] on 1/25 requiring intermittent catheterization. Patient was brought into [facility] after experiencing an episode of seizure-like activity.  He was found to be febrile in the ED, have a UTI, also have acute renal failure with hyperkalemia.  Subjective / 24h Interval events: -Complains of severe back pain.  No nausea or vomiting.  Assessment & Plan: * Severe sepsis ([facility]) due to UTI / pyelonephritis- (present on admission) -likely due to UTI as well as Serratia bacteremia, he has been recently diagnosed with a urinary retention, had a cystoscopy 1/25 with [facility], he was instructed to self catheterize but apparently has not been doing this consistently.  He is septic on admission with fever, elevated WBC and a UA suggesting urinary tract infection.  He was placed on broad-spectrum antibiotics, continue.  ID consulted, recommending 7 days of cefepime  Acute kidney injury (AKI) with acute tubular necrosis (ATN) ([facility])- (present on admission) -likely obstructive in the setting of retention.  He has had a Foley catheter placed, continue.  Nephrology consulted,  appreciate input.  Kidney function improving today  Acute metabolic encephalopathy- (present on admission) -in the setting of sepsis, uremia.  Mental status clearing  Hyperkalemia- (present on admission) -due to AKI, Lokelma, calcium gluconate, dextrose and insulin. Nephrology following.  Potassium normalized today  Metabolic acidosis- (present on admission) -renal following, now resolved  Bacterial infection due to Serratia - ID following.  Has a port in place  Hypoglycemia- (present on admission) -due to sepsis  Grand mal seizure ([facility])- (present on admission) -likely in the setting of febrile illness/sepsis.  Neurology consulted, appreciate input. EEG negative 2/1.  Recommending to continue [place] for 10 total days  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 [facility])- (present on admission) -outpatient management     Scheduled Meds:  Chlorhexidine Gluconate Cloth  6 each Topical Daily   heparin  5,000 Units Subcutaneous Q8H   pantoprazole (PROTONIX) IV  40 mg Intravenous Q24H   tamsulosin  0.8 mg Oral Daily   Continuous Infusions:  [START ON 12/07/2021] ceFEPime (MAXIPIME) IV     levETIRAcetam 500 mg (12/06/21 0829)   PRN Meds:.acetaminophen **OR** acetaminophen, ondansetron **OR** ondansetron (ZOFRAN) IV, oxyCODONE, polyethylene glycol  Diet Orders (From admission, onward)     Start     Ordered   12/04/21 9470  Diet vegetarian Room service appropriate? Yes; Fluid consistency: Thin  Diet effective now       Question Answer Comment  Room service appropriate? Yes   Fluid consistency: Thin      12/04/21 0636            DVT prophylaxis: heparin injection 5,000 Units Start: 12/04/21 0600   Lab Results  Component Value Date   PLT 482 (H) 12/06/2021  Code Status: Full Code  Family Communication: no family at bedside  Status is: Inpatient  Remains inpatient appropriate because: severity of illness  Level of care: Progressive  Consultants:   ID Nephrology  Microbiology  Blood cultures - Serratia  Antimicrobials: Cefepime     Objective: Vitals:   12/05/21 2000 12/06/21 0535 12/06/21 0819 12/06/21 1129  BP: (!) 113/103 (!) 165/84 (!) 150/75 (!) 167/63  Pulse: 93 77 (!) 58 (!) 49  Resp: 17 (!) 21 15 18   Temp:  99.2 F (37.3 C)    TempSrc:  Oral    SpO2: 95% 95% 94% 93%  Weight:      Height:        Intake/Output Summary (Last 24 hours) at 12/06/2021 1438 Last data filed at 12/06/2021 1305 Gross per 24 hour  Intake 2107.55 ml  Output 5600 ml  Net -3492.45 ml    Wt Readings from Last 3 Encounters:  12/04/21 87.7 kg  12/03/21 87.7 kg  10/07/21 86.6 kg    Examination:  Constitutional: NAD Eyes: lids and conjunctivae normal, no scleral icterus ENMT: mmm Neck: normal, supple Respiratory: clear to auscultation bilaterally, no wheezing, no crackles.  Cardiovascular: Regular rate and rhythm, no murmurs / rubs / gallops. No LE edema. Abdomen: soft, no distention, no tenderness. Bowel sounds positive.  Skin: no rashes Neurologic: no focal deficits, equal strength   Data Reviewed: I have independently reviewed following labs and imaging studies  CBC Recent Labs  Lab 12/03/21 2228 12/03/21 2232 12/03/21 2327 12/05/21 0146 12/06/21 0046  WBC 12.4*  --   --  16.7* 15.1*  HGB 9.8* 10.9* 10.9* 8.8* 8.4*  HCT 30.6* 32.0* 32.0* 27.0* 26.4*  PLT 558*  --   --  476* 482*  MCV 85.2  --   --  83.1 84.3  MCH 27.3  --   --  27.1 26.8  MCHC 32.0  --   --  32.6 31.8  RDW 15.4  --   --  15.3 15.5  LYMPHSABS 0.4*  --   --  0.7  --   MONOABS 0.2  --   --  1.1*  --   EOSABS 0.1  --   --  0.2  --   BASOSABS 0.0  --   --  0.0  --      Recent Labs  Lab 12/03/21 2228 12/03/21 2231 12/03/21 2232 12/03/21 2327 12/04/21 0123 12/04/21 0452 12/04/21 2020 12/05/21 0146 12/06/21 0046  NA 131*  --    < > 130*  --  137 136 139 139  K >7.5*  --    < > 7.7*  --  6.2* 6.2* 5.8* 4.5  CL 97*  --   --  104  --  112* 101  104 105  CO2 18*  --   --   --   --  14* 21* 24 25  GLUCOSE 80  --   --  78  --  59* 125* 149* 107*  BUN 89*  --   --  92*  --  71* 57* 52* 28*  CREATININE 14.24*  --   --  14.90*  --  9.97* 7.92* 6.69* 3.62*  CALCIUM 9.2  --   --   --   --  7.4* 9.2 8.8* 8.6*  AST 22  --   --   --   --   --   --  26 33  ALT 18  --   --   --   --   --   --  14 21  ALKPHOS 72  --   --   --   --   --   --  77 96  BILITOT 0.7  --   --   --   --   --   --  0.5 0.4  ALBUMIN 2.9*  --   --   --   --  1.8* 2.6* 2.1* 2.1*  MG  --   --   --   --   --   --   --  1.7 1.4*  LATICACIDVEN  --  1.1  --   --  1.7  --   --   --   --   INR 1.2  --   --   --   --   --   --   --   --    < > = values in this interval not displayed.     ------------------------------------------------------------------------------------------------------------------ No results for input(s): CHOL, HDL, LDLCALC, TRIG, CHOLHDL, LDLDIRECT in the last 72 hours.  Lab Results  Component Value Date   HGBA1C 5.50 11/19/2015   ------------------------------------------------------------------------------------------------------------------ No results for input(s): TSH, T4TOTAL, T3FREE, THYROIDAB in the last 72 hours.  Invalid input(s): FREET3  Cardiac Enzymes No results for input(s): CKMB, TROPONINI, MYOGLOBIN in the last 168 hours.  Invalid input(s): CK ------------------------------------------------------------------------------------------------------------------ No results found for: BNP  CBG: Recent Labs  Lab 12/06/21 0109 12/06/21 0526 12/06/21 0835 12/06/21 1132 12/06/21 1251  GLUCAP 113* 99 104* 91 129*     Recent Results (from the past 240 hour(s))  Urine Culture     Status: Abnormal   Collection Time: 12/03/21  1:32 AM   Specimen: In/Out Cath Urine  Result Value Ref Range Status   Specimen Description IN/OUT CATH URINE  Final   Special Requests   Final    NONE Performed at [facility], [address],  [place], [place] [address]    Culture >=100,000 COLONIES/mL SERRATIA MARCESCENS (A)  Final   Report Status 12/06/2021 FINAL  Final   Organism ID, Bacteria SERRATIA MARCESCENS (A)  Final      Susceptibility   Serratia marcescens - MIC*    CEFAZOLIN >=64 RESISTANT Resistant     CEFEPIME <=0.12 SENSITIVE Sensitive     CEFTRIAXONE <=0.25 SENSITIVE Sensitive     CIPROFLOXACIN <=0.25 SENSITIVE Sensitive     GENTAMICIN <=1 SENSITIVE Sensitive     NITROFURANTOIN 256 RESISTANT Resistant     TRIMETH/SULFA <=20 SENSITIVE Sensitive     * >=100,000 COLONIES/mL SERRATIA MARCESCENS  Culture, blood (routine x 2)     Status: Abnormal (Preliminary result)   Collection Time: 12/03/21 10:31 PM   Specimen: BLOOD  Result Value Ref Range Status   Specimen Description BLOOD RIGHT ANTECUBITAL  Final   Special Requests   Final    BOTTLES DRAWN AEROBIC AND ANAEROBIC Blood Culture adequate volume   Culture  Setup Time (A)  Final    GRAM VARIABLE ROD IN BOTH AEROBIC AND ANAEROBIC BOTTLES CRITICAL RESULT CALLED TO, READ BACK BY AND VERIFIED WITH: PHARMD E MARTIN [id number] AT 1328 BY CM    Culture (A)  Final    SERRATIA MARCESCENS SUSCEPTIBILITIES TO FOLLOW Performed at [facility], [place] [address] [place],  [address]    Report Status PENDING  Incomplete  Blood Culture ID Panel (Reflexed)     Status: Abnormal   Collection Time: 12/03/21 10:31 PM  Result Value Ref Range Status   Enterococcus faecalis NOT DETECTED NOT  DETECTED Final   Enterococcus Faecium NOT DETECTED NOT DETECTED Final   Listeria monocytogenes NOT DETECTED NOT DETECTED Final   Staphylococcus species NOT DETECTED NOT DETECTED Final   Staphylococcus aureus (BCID) NOT DETECTED NOT DETECTED Final   Staphylococcus epidermidis NOT DETECTED NOT DETECTED Final   Staphylococcus lugdunensis NOT DETECTED NOT DETECTED Final   Streptococcus species NOT DETECTED NOT DETECTED Final   Streptococcus agalactiae NOT DETECTED NOT DETECTED Final    Streptococcus pneumoniae NOT DETECTED NOT DETECTED Final   Streptococcus pyogenes NOT DETECTED NOT DETECTED Final   A.calcoaceticus-baumannii NOT DETECTED NOT DETECTED Final   Bacteroides fragilis NOT DETECTED NOT DETECTED Final   Enterobacterales DETECTED (A) NOT DETECTED Final    Comment: Enterobacterales represent a large order of gram negative bacteria, not a single organism. CRITICAL RESULT CALLED TO, READ BACK BY AND VERIFIED WITH: PHARMD E MARTIN [id number] AT 1330 BY CM    Enterobacter cloacae complex NOT DETECTED NOT DETECTED Final   Escherichia coli NOT DETECTED NOT DETECTED Final   Klebsiella aerogenes NOT DETECTED NOT DETECTED Final   Klebsiella oxytoca NOT DETECTED NOT DETECTED Final   Klebsiella pneumoniae NOT DETECTED NOT DETECTED Final   Proteus species NOT DETECTED NOT DETECTED Final   Salmonella species NOT DETECTED NOT DETECTED Final   Serratia marcescens DETECTED (A) NOT DETECTED Final    Comment: CRITICAL RESULT CALLED TO, READ BACK BY AND VERIFIED WITH: PHARMD E MARTIN [id number] AT 1330 BY CM    Haemophilus influenzae NOT DETECTED NOT DETECTED Final   Neisseria meningitidis NOT DETECTED NOT DETECTED Final   Pseudomonas aeruginosa NOT DETECTED NOT DETECTED Final   Stenotrophomonas maltophilia NOT DETECTED NOT DETECTED Final   Candida albicans NOT DETECTED NOT DETECTED Final   Candida auris NOT DETECTED NOT DETECTED Final   Candida glabrata NOT DETECTED NOT DETECTED Final   Candida krusei NOT DETECTED NOT DETECTED Final   Candida parapsilosis NOT DETECTED NOT DETECTED Final   Candida tropicalis NOT DETECTED NOT DETECTED Final   Cryptococcus neoformans/gattii NOT DETECTED NOT DETECTED Final   CTX-M ESBL NOT DETECTED NOT DETECTED Final   Carbapenem resistance IMP NOT DETECTED NOT DETECTED Final   Carbapenem resistance KPC NOT DETECTED NOT DETECTED Final   Carbapenem resistance NDM NOT DETECTED NOT DETECTED Final   Carbapenem resist OXA 48 LIKE NOT DETECTED NOT DETECTED  Final   Carbapenem resistance VIM NOT DETECTED NOT DETECTED Final    Comment: Performed at [facility], [address] [place], [place] [address]  Culture, blood (routine x 2)     Status: Abnormal (Preliminary result)   Collection Time: 12/03/21 10:36 PM   Specimen: BLOOD  Result Value Ref Range Status   Specimen Description BLOOD LEFT ANTECUBITAL  Final   Special Requests   Final    BOTTLES DRAWN AEROBIC AND ANAEROBIC Blood Culture adequate volume   Culture  Setup Time (A)  Final    GRAM VARIABLE ROD IN BOTH AEROBIC AND ANAEROBIC BOTTLES CRITICAL VALUE NOTED.  VALUE IS CONSISTENT WITH PREVIOUSLY REPORTED AND CALLED VALUE. Performed at [facility], [place] [address], [place], [place] [address]    Culture SERRATIA MARCESCENS (A)  Final   Report Status PENDING  Incomplete  Resp Panel by RT-PCR (Flu A&B, Covid) Nasopharyngeal Swab     Status: None   Collection Time: 12/03/21 10:59 PM   Specimen: Nasopharyngeal Swab; Nasopharyngeal(NP) swabs in vial transport medium  Result Value Ref Range Status   SARS Coronavirus 2 by RT PCR NEGATIVE  NEGATIVE Final    Comment: (NOTE) SARS-CoV-2 target nucleic acids are NOT DETECTED.  The SARS-CoV-2 RNA is generally detectable in upper respiratory specimens during the acute phase of infection. The lowest concentration of SARS-CoV-2 viral copies this assay can detect is 138 copies/mL. A negative result does not preclude SARS-Cov-2 infection and should not be used as the sole basis for treatment or other patient management decisions. A negative result may occur with  improper specimen collection/handling, submission of specimen other than nasopharyngeal swab, presence of viral mutation(s) within the areas targeted by this assay, and inadequate number of viral copies(<138 copies/mL). A negative result must be combined with clinical observations, patient history, and epidemiological information. The expected result is Negative.  Fact Sheet  for Patients:  EntrepreneurPulse.com.au  Fact Sheet for Healthcare Providers:  IncredibleEmployment.be  This test is no t yet approved or cleared by the [place] FDA and  has been authorized for detection and/or diagnosis of SARS-CoV-2 by FDA under an Emergency Use Authorization (EUA). This EUA will remain  in effect (meaning this test can be used) for the duration of the COVID-19 declaration under Section 564(b)(1) of the Act, 21 U.S.C.section 360bbb-3(b)(1), unless the authorization is terminated  or revoked sooner.       Influenza A by PCR NEGATIVE NEGATIVE Final   Influenza B by PCR NEGATIVE NEGATIVE Final    Comment: (NOTE) The Xpert Xpress SARS-CoV-2/FLU/RSV plus assay is intended as an aid in the diagnosis of influenza from Nasopharyngeal swab specimens and should not be used as a sole basis for treatment. Nasal washings and aspirates are unacceptable for Xpert Xpress SARS-CoV-2/FLU/RSV testing.  Fact Sheet for Patients: EntrepreneurPulse.com.au  Fact Sheet for Healthcare Providers: IncredibleEmployment.be  This test is not yet approved or cleared by the [place] FDA and has been authorized for detection and/or diagnosis of SARS-CoV-2 by FDA under an Emergency Use Authorization (EUA). This EUA will remain in effect (meaning this test can be used) for the duration of the COVID-19 declaration under Section 564(b)(1) of the Act, 21 U.S.C. section 360bbb-3(b)(1), unless the authorization is terminated or revoked.  Performed at [facility], [place] [address] [place], [place] [address]       Radiology Studies: No results found.   Marzetta Board, MD, PhD [facility]  Between 7 am - 7 pm I am available, please contact me [facility] (for emergencies) or Securechat (non urgent messages)  Between 7 pm - 7 am I am not available, please contact night coverage MD/APP [facility]

## 2021-12-06 NOTE — Progress Notes (Signed)
Pharmacy Antibiotic Note  [facility] is a 51 y.o. male admitted on 12/03/2021 with Serratia urosepsis, PAC in place for chemotherapy. Pharmacy has been consulted for Cefepime dosing.  AKI resolving with SCr down to 3.62 << 6.69, CrCl~20-30 ml/min. Cefepime dose adjusted today.  Plan: - Increase Cefepime to 2g IV every 24 hours - Will continue to follow renal function, culture results, LOT, and antibiotic de-escalation plans   Height: 5\' 7"  (170.2 cm) Weight: 87.7 kg (193 lb 5.5 oz) IBW/kg (Calculated) : 66.1  Temp (24hrs), Avg:99.3 F (37.4 C), Min:98.4 F (36.9 C), Max:100.2 F (37.9 C)  Recent Labs  Lab 12/03/21 2228 12/03/21 2231 12/03/21 2327 12/04/21 0123 12/04/21 0452 12/04/21 2020 12/05/21 0146 12/06/21 0046  WBC 12.4*  --   --   --   --   --  16.7* 15.1*  CREATININE 14.24*  --  14.90*  --  9.97* 7.92* 6.69* 3.62*  LATICACIDVEN  --  1.1  --  1.7  --   --   --   --     Estimated Creatinine Clearance: 25.2 mL/min (A) (by C-G formula based on SCr of 3.62 mg/dL (H)).    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   Other Shortness Of Breath and Itching    Reaction to cut grass, pet dander   Contrast Media [Iodinated Contrast Media] Hives   Shrimp [Shellfish Allergy] Swelling and Other (See Comments)    Muscle cramps   Vancomycin Itching    Reaction to IV - pt states no reaction if it is infused slowly   Wheat Bran Swelling    Some swelling   Latex Hives and Rash    Reaction to fumes from latex paint    Antimicrobials this admission: Cefepime 1/31 >> Vancomycin 2/1 x 1  Microbiology results: 1/31 UCx >> Serratia 1/31 BCx >> Serratia  Thank you for allowing pharmacy to be a part of this patients care.  Alycia Rossetti, PharmD, BCPS Clinical Pharmacist 12/06/2021 1:29 PM   **Pharmacist phone directory can now be found on [email or web address]  (PW [email or web address]).  Listed under [place]msig

## 2021-12-06 NOTE — Progress Notes (Signed)
PHARMACY CONSULT NOTE FOR:  OUTPATIENT  PARENTERAL ANTIBIOTIC THERAPY (OPAT)  Indication: Serratia Bacteremia Regimen: Cefepime 2g IV every 24 hours End date: 12/10/21  IV antibiotic discharge orders are pended. To discharging provider:  please sign these orders via discharge navigator,  Select New Orders & click on the button choice - Manage This Unsigned Work.    Thank you for allowing pharmacy to be a part of this patients care.  Alycia Rossetti, PharmD, BCPS Infectious Diseases Clinical Pharmacist 12/06/2021 1:32 PM   **Pharmacist phone directory can now be found on [email or web address] ([email or web address]).  Listed under [place].

## 2021-12-06 NOTE — Progress Notes (Addendum)
Nephrology Follow-Up Consult note   Assessment/Recommendations: [facility] Colon is a/an 51 y.o. male with a past medical history metastatic adenocarcinoma of the rectum comlicated by overflow incontinence, recent admission fo SBO in December 2022 at [facility] who present w/ seizure in setting of AKI due to urinary obstruction.     Non-Oliguric/Anuric AKI (improved): Likely secondary to urinary obstruction due to not self cathing for overflow incontinence. sCr continues to improve 3.6 today expect this to normalized in a few days. -Continue foley catheter  -Continue to monitor daily Cr, Dose meds for GFR -Monitor Daily I/Os, -Avoid nephrotoxic medications including NSAIDs and Vanc/Zosyn combo -Needs outpatient urology follow up for overflow incontinence    Metabolic acidosis/Hyperkalemia: Likely RTA4 in setting of urinary obstruction. Resolved   Volume Status: Euvolemic    Sepsis secondary to pyelonephritis: Blood and urine cultures growing serratia marcescens resistant to nitrofurantoin and cefazolin  -Cefepime day 3/7, per ID   Acute encephalopathy/seizure: No history of seizure likely multifactorial in setting of infection, electrolyte derangement, postictal state, and seizure medications. - Keppra x 10 days per neurology - EEG without seizures, non contrast MRI without metastatic disease  Will sign off for now.  Please call with any questions or concerns.    [name], MD IM PGY-2 12/06/2021 8:41 AM  ___________________________________________________________  CC: Seizure  Interval History/Subjective: No acute overnight events. Patient states he is feeling well. No new complaints today. Denies any pain or dysuria.    Medications:  Current Facility-Administered Medications  Medication Dose Route Frequency Provider Last Rate Last Admin   acetaminophen (TYLENOL) tablet 650 mg  650 mg Oral Q6H PRN Vernelle Emerald, MD   650 mg at 12/06/21 5284   Or   acetaminophen (TYLENOL)  suppository 650 mg  650 mg Rectal Q6H PRN Shalhoub, Sherryll Burger, MD       ceFEPIme (MAXIPIME) 1 g in sodium chloride 0.9 % 100 mL IVPB  1 g Intravenous Q24H Shalhoub, Sherryll Burger, MD 200 mL/hr at 12/06/21 0542 1 g at 12/06/21 0542   Chlorhexidine Gluconate Cloth 2 % PADS 6 each  6 each Topical Daily Shalhoub, Sherryll Burger, MD   6 each at 12/05/21 1111   heparin injection 5,000 Units  5,000 Units Subcutaneous Q8H Vernelle Emerald, MD   5,000 Units at 12/06/21 0535   levETIRAcetam (KEPPRA) IVPB 500 mg/100 mL premix  500 mg Intravenous Q12H Vernelle Emerald, MD 400 mL/hr at 12/06/21 0829 500 mg at 12/06/21 0829   ondansetron (ZOFRAN) tablet 4 mg  4 mg Oral Q6H PRN Vernelle Emerald, MD       Or   ondansetron [facility]) injection 4 mg  4 mg Intravenous Q6H PRN Shalhoub, Sherryll Burger, MD       oxyCODONE (Oxy IR/ROXICODONE) immediate release tablet 10 mg  10 mg Oral Q4H PRN Caren Griffins, MD       pantoprazole (PROTONIX) injection 40 mg  40 mg Intravenous Q24H Vernelle Emerald, MD   40 mg at 12/06/21 0537   polyethylene glycol (MIRALAX / GLYCOLAX) packet 17 g  17 g Oral Daily PRN Vernelle Emerald, MD       tamsulosin (FLOMAX) capsule 0.8 mg  0.8 mg Oral Daily Shalhoub, Sherryll Burger, MD   0.8 mg at 12/06/21 1324     Review of Systems: 10 systems reviewed and negative except per interval history/subjective  Physical Exam: Vitals:   12/06/21 0535 12/06/21 0819  BP: (!) 165/84 (!) 150/75  Pulse: 77 (!) 58  Resp: (!) 21 15  Temp: 99.2 F (37.3 C)   SpO2: 95% 94%   Total I/O In: -  Out: 600 [Urine:600]  Intake/Output Summary (Last 24 hours) at 12/06/2021 0841 Last data filed at 12/06/2021 6578 Gross per 24 hour  Intake 1887.55 ml  Output 4100 ml  Net -2212.45 ml    Constitutional: chronically ill appearing no acute distress ENMT: ears and nose without scars or lesions, MMM CV: normal rate, LE edema improved 1+ bilaterally to knees Respiratory: clear to auscultation, normal work of  breathing Gastrointestinal: soft, non-tender, no palpable masses or hernias Skin: no visible lesions or rashes Psych: alert, judgement/insight appropriate, appropriate mood and affect Neuro: Aox4, speech more fluent today, still has some difficulty maintaining attention   Test Results I personally reviewed new and old clinical labs and radiology tests Lab Results  Component Value Date   NA 139 12/06/2021   K 4.5 12/06/2021   CL 105 12/06/2021   CO2 25 12/06/2021   BUN 28 (H) 12/06/2021   CREATININE 3.62 (H) 12/06/2021   GFR 80.03 01/28/2019   CALCIUM 8.6 (L) 12/06/2021   ALBUMIN 2.1 (L) 12/06/2021   PHOS 3.4 12/06/2021

## 2021-12-07 ENCOUNTER — Inpatient Hospital Stay (HOSPITAL_COMMUNITY): Payer: Medicaid Other

## 2021-12-07 ENCOUNTER — Encounter (HOSPITAL_COMMUNITY): Payer: Medicaid Other

## 2021-12-07 DIAGNOSIS — R6520 Severe sepsis without septic shock: Secondary | ICD-10-CM | POA: Diagnosis not present

## 2021-12-07 DIAGNOSIS — A419 Sepsis, unspecified organism: Secondary | ICD-10-CM | POA: Diagnosis not present

## 2021-12-07 LAB — BASIC METABOLIC PANEL
Anion gap: 9 (ref 5–15)
BUN: 17 mg/dL (ref 6–20)
CO2: 24 mmol/L (ref 22–32)
Calcium: 8.9 mg/dL (ref 8.9–10.3)
Chloride: 105 mmol/L (ref 98–111)
Creatinine, Ser: 2.26 mg/dL — ABNORMAL HIGH (ref 0.61–1.24)
GFR, Estimated: 34 mL/min — ABNORMAL LOW (ref >60)
Glucose, Bld: 90 mg/dL (ref 70–99)
Potassium: 4.5 mmol/L (ref 3.5–5.1)
Sodium: 138 mmol/L (ref 135–145)

## 2021-12-07 LAB — RENAL FUNCTION PANEL
Albumin: 2.2 g/dL — ABNORMAL LOW (ref 3.5–5.0)
Anion gap: 12 (ref 5–15)
BUN: 17 mg/dL (ref 6–20)
CO2: 21 mmol/L — ABNORMAL LOW (ref 22–32)
Calcium: 8.6 mg/dL — ABNORMAL LOW (ref 8.9–10.3)
Chloride: 103 mmol/L (ref 98–111)
Creatinine, Ser: 2.29 mg/dL — ABNORMAL HIGH (ref 0.61–1.24)
GFR, Estimated: 34 mL/min — ABNORMAL LOW (ref >60)
Glucose, Bld: 86 mg/dL (ref 70–99)
Phosphorus: 2.6 mg/dL (ref 2.5–4.6)
Potassium: 4.4 mmol/L (ref 3.5–5.1)
Sodium: 136 mmol/L (ref 135–145)

## 2021-12-07 LAB — CBC
HCT: 28.2 % — ABNORMAL LOW (ref 39.0–52.0)
Hemoglobin: 9.1 g/dL — ABNORMAL LOW (ref 13.0–17.0)
MCH: 27.1 pg (ref 26.0–34.0)
MCHC: 32.3 g/dL (ref 30.0–36.0)
MCV: 83.9 fL (ref 80.0–100.0)
Platelets: 518 10*3/uL — ABNORMAL HIGH (ref 150–400)
RBC: 3.36 MIL/uL — ABNORMAL LOW (ref 4.22–5.81)
RDW: 15.5 % (ref 11.5–15.5)
WBC: 15.6 10*3/uL — ABNORMAL HIGH (ref 4.0–10.5)
nRBC: 0 % (ref 0.0–0.2)

## 2021-12-07 LAB — CULTURE, BLOOD (ROUTINE X 2)
Special Requests: ADEQUATE
Special Requests: ADEQUATE

## 2021-12-07 LAB — GLUCOSE, CAPILLARY
Glucose-Capillary: 111 mg/dL — ABNORMAL HIGH (ref 70–99)
Glucose-Capillary: 84 mg/dL (ref 70–99)
Glucose-Capillary: 134 mg/dL — ABNORMAL HIGH (ref 70–99)
Glucose-Capillary: 100 mg/dL — ABNORMAL HIGH (ref 70–99)

## 2021-12-07 MED ORDER — CEFEPIME IV (FOR PTA / DISCHARGE USE ONLY): 2.0000 g | INTRAVENOUS | 0 refills | Status: AC

## 2021-12-07 MED ORDER — ALPRAZOLAM 0.5 MG PO TABS
0.2500 mg | ORAL_TABLET | Freq: Once | ORAL | Status: AC
  Administered 2021-12-07: 0.25 mg via ORAL
  Filled 2021-12-07: qty 1

## 2021-12-07 NOTE — Progress Notes (Signed)
VASCULAR LAB    Sent for patient via transport at 15:45.  Patient in bathroom. Transport waited 20 minutes for patient, patient requested transport come back so that he "didn't feel rushed."  Unable to attempt again today as there are stats in the ED and only one tech after 16:30 on weekends.  Will attempt 12/08/2021 as schedule permits.    Avrom Robarts, RVT 12/07/2021, 4:59 PM

## 2021-12-07 NOTE — TOC Initial Note (Addendum)
Transition of Care [facility]) - Initial/Assessment Note    Patient Details  Name: Bradley Colon MRN: [id number] Date of Birth: August 31, 1971  Transition of Care [facility]) CM/SW Contact:    Carles Collet, RN Phone Number: 12/07/2021, 1:25 PM  Clinical Narrative:             Notified by MD that patient is eligible for DC tomorrow pending IV Abx set up through [facility].  Requested Pam w [facility] to assess for set up and assistance w [facility] RN. This would not be able to take place until Monday 2/6 however, due to insurance authorization.     Plan for DC Monday with dose for Monday given prior to DC and one dose at home on Tuesday.  Pam to assist with [facility] RN through [place].   Expected Discharge Plan: [place] Barriers to Discharge: Continued Medical Work up   Patient Goals and CMS Choice        Expected Discharge Plan and Services Expected Discharge Plan: [place]   Discharge Planning Services: CM Consult   Living arrangements for the past 2 months: Single Family Home                                      Prior Living Arrangements/Services Living arrangements for the past 2 months: Single Family Home Lives with:: Spouse                   Activities of Daily Living      Permission Sought/Granted                  Emotional Assessment              Admission diagnosis:  Hyperkalemia [E87.5] AKI (acute kidney injury) ([place]) [N17.9] Seizure-like activity ([place]) [R56.9] Severe sepsis ([place]) [A41.9, R65.20] Altered mental status, unspecified altered mental status type [R41.82] Patient Active Problem List   Diagnosis Date Noted   Bacterial infection due to [place] 12/05/2021   Severe sepsis ([place]) due to UTI / pyelonephritis 12/04/2021   Hyperkalemia 12/04/2021   Acute kidney injury (AKI) with acute tubular necrosis (ATN) ([place]) 00/76/2263   Acute metabolic encephalopathy 33/54/5625   Grand mal seizure ([place]) 12/04/2021   Acute  bacterial pyelonephritis 63/89/3734   Metabolic acidosis 28/76/8115   Hypoglycemia 12/04/2021   Palliative care patient 10/11/2021   Lung nodules 07/10/2021   Goals of care, counseling/discussion 10/25/2020   Port-A-Cath in place 04/12/2020   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[place]) 03/02/2019   Reactive airway disease 03/02/2019   Vegetarian 03/02/2019   Adjustment disorder with mixed anxiety and depressed mood 03/02/2019   Chronic pain after traumatic injury 03/02/2019   Change in bowel habits 01/31/2019   Constipation 01/31/2019   Grade III hemorrhoids 01/31/2019   Rectal pain 01/31/2019   Fibromyalgia 12/18/2017   Dysesthesia affecting both sides of body 12/18/2017   Spondylosis without myelopathy or radiculopathy, lumbar region 12/18/2017   Chronic post-traumatic stress disorder (PTSD) 05/25/2017   Other fatigue 07/27/2016   Chronic left shoulder pain 07/22/2016   Chronic neck pain 11/19/2015   Chronic low back pain 09/18/2014   PCP:  Merton Border, PA Pharmacy:   [id number] - [place], [place] [address]  [place] [address] Phone: [phone] Fax: [phone]     Social Determinants of  Health (SDOH) Interventions    Readmission Risk Interventions No flowsheet data found.

## 2021-12-07 NOTE — Evaluation (Signed)
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 Patient is ambulating independently with multiple higher-level balance skills assessed and pt without imbalance.  PT is signing off. Thank you for this referral.        Recommendations for follow up therapy are one component of a multi-disciplinary discharge planning process, led by the attending physician.  Recommendations may be updated based on patient status, additional functional criteria and insurance authorization.  Follow Up Recommendations No PT follow up    Assistance Recommended at Discharge None  Patient can return home with the following       Equipment Recommendations None recommended by PT  Recommendations for Other Services       Functional Status Assessment Patient has not had a recent decline in their functional status     Precautions / Restrictions Precautions Precautions: None      Mobility  Bed Mobility Overal bed mobility: Independent                  Transfers Overall transfer level: Independent                      Ambulation/Gait Ambulation/Gait assistance: Independent Gait Distance (Feet): 40  Feet Assistive device: None Gait Pattern/deviations: WFL(Within Functional Limits)       General Gait Details: did not want to walk in hallway with his foley catheter; ambulated in tight spaces with turns, backward steps, side steps without difficulty  Stairs            Wheelchair Mobility    Modified Rankin (Stroke Patients Only)       Balance Overall balance assessment: Independent                                           Pertinent Vitals/Pain Pain Assessment Pain Assessment: Faces Faces Pain Scale: Hurts little more Pain Location: right calf Pain Descriptors / Indicators: Discomfort, Sore Pain Intervention(s): Limited activity within patient's tolerance, Monitored during session    Home Living Family/patient expects to be discharged to:: Private residence Living Arrangements: Spouse/significant other Available Help at Discharge: Family Type of Home: House Home Access: Level entry       Home Layout: One level        Prior Function Prior Level of Function : Independent/Modified Independent             Mobility Comments: reports having problems with dizziness when walking; denies faint feeling or spinning; cannot describe other than dizzy       Hand Dominance        Extremity/Trunk Assessment   Upper Extremity Assessment Upper Extremity Assessment: Overall WFL for tasks assessed    Lower Extremity Assessment Lower  Extremity Assessment: Overall WFL for tasks assessed    Cervical / Trunk Assessment Cervical / Trunk Assessment: Normal  Communication   Communication: No difficulties  Cognition Arousal/Alertness: Awake/alert Behavior During Therapy: WFL for tasks assessed/performed Overall Cognitive Status: Within Functional Limits for tasks assessed                                          General Comments      Exercises     Assessment/Plan    PT Assessment Patient does not need any further PT  services  PT Problem List         PT Treatment Interventions      PT Goals (Current goals can be found in the Care Plan section)  Acute Rehab PT Goals Patient Stated Goal: return home soon PT Goal Formulation: All assessment and education complete, DC therapy    Frequency       Co-evaluation               AM-PAC PT "6 Clicks" Mobility  Outcome Measure Help needed turning from your back to your side while in a flat bed without using bedrails?: None Help needed moving from lying on your back to sitting on the side of a flat bed without using bedrails?: None Help needed moving to and from a bed to a chair (including a wheelchair)?: None Help needed standing up from a chair using your arms (e.g., wheelchair or bedside chair)?: None Help needed to walk in hospital room?: None Help needed climbing 3-5 steps with a railing? : None 6 Click Score: 24    End of Session Equipment Utilized During Treatment: Gait belt Activity Tolerance: Patient tolerated treatment well Patient left: in chair;with call bell/phone within reach Nurse Communication: Mobility status;Other (comment) (no PT or DME needs) PT Visit Diagnosis: Difficulty in walking, not elsewhere classified (R26.2)    Time: 7867-6720 PT Time Calculation (min) (ACUTE ONLY): 20 min   Charges:   PT Evaluation $PT Eval Low Complexity: [place], PT Acute Rehabilitation Services  Pager [phone] Office [phone]   Rexanne Mano 12/07/2021, 3:19 PM

## 2021-12-07 NOTE — Progress Notes (Signed)
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  He has been following with Drs. Sherril and [employer].  Patient is S/P low anterior resection with diverting loop ileostomy and ablation of liver metastasis 11/2019 and has received radiation as well as multiple courses of chemotherapy with the most recent dose of chemotherapy (FOLFIRI/panitumubmab) in 09/2021 with recent complications of small bowel obstruction requiring hospitalization at [facility] 12/10-12/10/2021 and most recently, development of urinary retention and overall incontinence having been seen by [facility] urology on 1/25 requiring intermittent catheterization. Patient was brought into [facility] emergency department via EMS after experiencing an episode of seizure-like activity.  He was found to be febrile in the ED, have a UTI, also have acute renal failure with hyperkalemia.  Subjective / 24h Interval events: -Feeling better.  Asking about going home  Assessment & Plan: * Severe sepsis ([place]) due to UTI / pyelonephritis- (present on admission) -likely due to UTI as well as Serratia bacteremia, he has been recently diagnosed with a urinary retention, had a cystoscopy 1/25 with [place] [facility], he was instructed to self catheterize but apparently has not been doing this consistently.  He is septic on admission with fever, elevated WBC and a UA suggesting urinary tract infection.  He was placed on broad-spectrum antibiotics, continue.  ID consulted, recommending 7 days of cefepime  Acute kidney injury (AKI) with acute tubular necrosis (ATN) ([place])- (present on admission) -likely obstructive in the setting of retention.  He has had a Foley catheter placed, continue.  Nephrology consulted and followed  patient.  Kidney function continues to improve  Acute metabolic encephalopathy- (present on admission) -in the setting of sepsis, uremia.  Mental status clearing  Hyperkalemia- (present on admission) -due to AKI, [place], calcium gluconate, dextrose and insulin. Nephrology following.  Potassium normalized  Metabolic acidosis- (present on admission) -renal following, now resolved  Bacterial infection due to Serratia - ID following.  Has a port in place  Hypoglycemia- (present on admission) -due to sepsis  Grand mal seizure ([place])- (present on admission) -likely in the setting of febrile illness/sepsis.  Neurology consulted, appreciate input. EEG negative 2/1.  Recommending to continue [place] for 10 total days  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 [facility])- (present on admission) -outpatient management     Scheduled Meds:  Chlorhexidine Gluconate Cloth  6 each Topical Daily   heparin  5,000 Units Subcutaneous Q8H   pantoprazole (PROTONIX) IV  40 mg Intravenous Q24H   tamsulosin  0.8 mg Oral Daily   Continuous Infusions:  ceFEPime (MAXIPIME) IV 2 g (12/07/21 0502)   levETIRAcetam 500 mg (12/07/21 0922)   PRN Meds:.acetaminophen **OR** acetaminophen, ondansetron **OR** ondansetron (ZOFRAN) IV, oxyCODONE, polyethylene glycol  Diet Orders (From admission, onward)     Start     Ordered   12/04/21 0160  Diet vegetarian Room service appropriate? Yes; Fluid consistency: Thin  Diet effective now       Question Answer Comment  Room service appropriate? Yes   Fluid consistency: Thin      12/04/21 0636            DVT prophylaxis: heparin injection 5,000 Units Start: 12/04/21 0600   Lab Results  Component Value Date   PLT 518 (H) 12/07/2021      Code  Status: Full Code  Family Communication: no family at bedside  Status is: Inpatient  Remains inpatient appropriate because: severity of illness, likely home tomorrow  Level of care: Progressive  Consultants:   ID Nephrology  Microbiology  Blood cultures - Serratia  Antimicrobials: Cefepime     Objective: Vitals:   12/07/21 0500 12/07/21 0600 12/07/21 0730 12/07/21 1133  BP:   (!) 152/93 (!) 175/79  Pulse: 70 71 75 60  Resp: 18 16 16 17   Temp:   98.1 F (36.7 C) 98.5 F (36.9 C)  TempSrc:   Oral Oral  SpO2: 97% 95% 98% 95%  Weight:      Height:        Intake/Output Summary (Last 24 hours) at 12/07/2021 1251 Last data filed at 12/07/2021 0920 Gross per 24 hour  Intake 480 ml  Output 3675 ml  Net -3195 ml    Wt Readings from Last 3 Encounters:  12/04/21 87.7 kg  12/03/21 87.7 kg  10/07/21 86.6 kg    Examination:  Constitutional: NAD Eyes: lids and conjunctivae normal, no scleral icterus ENMT: mmm Neck: normal, supple Respiratory: clear to auscultation bilaterally, no wheezing, no crackles. Normal respiratory effort.  Cardiovascular: Regular rate and rhythm, no murmurs / rubs / gallops. No LE edema. Abdomen: soft, no distention, no tenderness. Bowel sounds positive.  Skin: no rashes Neurologic: no focal deficits, equal strength   Data Reviewed: I have independently reviewed following labs and imaging studies  CBC Recent Labs  Lab 12/03/21 2228 12/03/21 2232 12/03/21 2327 12/05/21 0146 12/06/21 0046 12/07/21 0100  WBC 12.4*  --   --  16.7* 15.1* 15.6*  HGB 9.8* 10.9* 10.9* 8.8* 8.4* 9.1*  HCT 30.6* 32.0* 32.0* 27.0* 26.4* 28.2*  PLT 558*  --   --  476* 482* 518*  MCV 85.2  --   --  83.1 84.3 83.9  MCH 27.3  --   --  27.1 26.8 27.1  MCHC 32.0  --   --  32.6 31.8 32.3  RDW 15.4  --   --  15.3 15.5 15.5  LYMPHSABS 0.4*  --   --  0.7  --   --   MONOABS 0.2  --   --  1.1*  --   --   EOSABS 0.1  --   --  0.2  --   --   BASOSABS 0.0  --   --  0.0  --   --      Recent Labs  Lab 12/03/21 2228 12/03/21 2231 12/03/21 2232 12/04/21 0123 12/04/21 0452 12/04/21 2020 12/05/21 0146 12/06/21 0046 12/07/21 0100  NA 131*  --    < >  --  137 136 139 139 138    136  K >7.5*  --    < >  --  6.2* 6.2* 5.8* 4.5 4.5   4.4  CL 97*  --    < >  --  112* 101 104 105 105   103  CO2 18*  --   --   --  14* 21* 24 25 24    21*  GLUCOSE 80  --    < >  --  59* 125* 149* 107* 90   86  BUN 89*  --    < >  --  71* 57* 52* 28* 17   17  CREATININE 14.24*  --    < >  --  9.97* 7.92* 6.69* 3.62* 2.26*   2.29*  CALCIUM 9.2  --   --   --  7.4* 9.2 8.8* 8.6* 8.9   8.6*  AST 22  --   --   --   --   --  26 33  --   ALT 18  --   --   --   --   --  14 21  --   ALKPHOS 72  --   --   --   --   --  77 96  --   BILITOT 0.7  --   --   --   --   --  0.5 0.4  --   ALBUMIN 2.9*  --   --   --  1.8* 2.6* 2.1* 2.1* 2.2*  MG  --   --   --   --   --   --  1.7 1.4*  --   LATICACIDVEN  --  1.1  --  1.7  --   --   --   --   --   INR 1.2  --   --   --   --   --   --   --   --    < > = values in this interval not displayed.     ------------------------------------------------------------------------------------------------------------------ No results for input(s): CHOL, HDL, LDLCALC, TRIG, CHOLHDL, LDLDIRECT in the last 72 hours.  Lab Results  Component Value Date   HGBA1C 5.50 11/19/2015   ------------------------------------------------------------------------------------------------------------------ No results for input(s): TSH, T4TOTAL, T3FREE, THYROIDAB in the last 72 hours.  Invalid input(s): FREET3  Cardiac Enzymes No results for input(s): CKMB, TROPONINI, MYOGLOBIN in the last 168 hours.  Invalid input(s): CK ------------------------------------------------------------------------------------------------------------------ No results found for: BNP  CBG: Recent Labs  Lab 12/06/21 1251 12/06/21 1658 12/06/21 2052 12/07/21 0734 12/07/21 1136  GLUCAP 129* 88 76 111* 84     Recent Results (from the past 240 hour(s))  Urine Culture     Status: Abnormal   Collection Time: 12/03/21  1:32 AM   Specimen: In/Out Cath Urine  Result Value Ref Range Status   Specimen  Description IN/OUT CATH URINE  Final   Special Requests   Final    NONE Performed at [facility], [place] [address], [place], [place] [address]    Culture >=100,000 COLONIES/mL SERRATIA MARCESCENS (A)  Final   Report Status 12/06/2021 FINAL  Final   Organism ID, Bacteria SERRATIA MARCESCENS (A)  Final      Susceptibility   Serratia marcescens - MIC*    CEFAZOLIN >=64 RESISTANT Resistant     CEFEPIME <=0.12 SENSITIVE Sensitive     CEFTRIAXONE <=0.25 SENSITIVE Sensitive     CIPROFLOXACIN <=0.25 SENSITIVE Sensitive     GENTAMICIN <=1 SENSITIVE Sensitive     NITROFURANTOIN 256 RESISTANT Resistant     TRIMETH/SULFA <=20 SENSITIVE Sensitive     * >=100,000 COLONIES/mL SERRATIA MARCESCENS  Culture, blood (routine x 2)     Status: Abnormal   Collection Time: 12/03/21 10:31 PM   Specimen: BLOOD  Result Value Ref Range Status   Specimen Description BLOOD RIGHT ANTECUBITAL  Final   Special Requests   Final    BOTTLES DRAWN AEROBIC AND ANAEROBIC Blood Culture adequate volume   Culture  Setup Time (A)  Final    GRAM VARIABLE ROD IN BOTH AEROBIC AND ANAEROBIC BOTTLES CRITICAL RESULT CALLED TO, READ BACK BY AND VERIFIED WITH: PHARMD E MARTIN [id number] AT 1328 BY CM Performed at [facility], [place] [address], [place], [place] [address]    Culture SERRATIA MARCESCENS (A)  Final   Report Status 12/07/2021 FINAL  Final   Organism ID, Bacteria SERRATIA MARCESCENS  Final      Susceptibility   Serratia marcescens - MIC*    CEFAZOLIN >=64 RESISTANT Resistant     CEFEPIME <=0.12 SENSITIVE Sensitive     CEFTAZIDIME <=1 SENSITIVE Sensitive     CEFTRIAXONE <=0.25 SENSITIVE Sensitive     CIPROFLOXACIN <=0.25 SENSITIVE Sensitive     GENTAMICIN <=1 SENSITIVE Sensitive     TRIMETH/SULFA <=20 SENSITIVE Sensitive     * SERRATIA MARCESCENS  Blood Culture ID Panel (Reflexed)     Status: Abnormal   Collection Time: 12/03/21 10:31 PM  Result Value Ref Range Status   Enterococcus faecalis NOT DETECTED  NOT DETECTED Final   Enterococcus Faecium NOT DETECTED NOT DETECTED Final   Listeria monocytogenes NOT DETECTED NOT DETECTED Final   Staphylococcus species NOT DETECTED NOT DETECTED Final   Staphylococcus aureus (BCID) NOT DETECTED NOT DETECTED Final   Staphylococcus epidermidis NOT DETECTED NOT DETECTED Final   Staphylococcus lugdunensis NOT DETECTED NOT DETECTED Final   Streptococcus species NOT DETECTED NOT DETECTED Final   Streptococcus agalactiae NOT DETECTED NOT DETECTED Final   Streptococcus pneumoniae NOT DETECTED NOT DETECTED Final   Streptococcus pyogenes NOT DETECTED NOT DETECTED Final   A.calcoaceticus-baumannii NOT DETECTED NOT DETECTED Final   Bacteroides fragilis NOT DETECTED NOT DETECTED Final   Enterobacterales DETECTED (A) NOT DETECTED Final    Comment: Enterobacterales represent a large order of gram negative bacteria, not a single organism. CRITICAL RESULT CALLED TO, READ BACK BY AND VERIFIED WITH: PHARMD E MARTIN [id number] AT 1330 BY CM    Enterobacter cloacae complex NOT DETECTED NOT DETECTED Final   Escherichia coli NOT DETECTED NOT DETECTED Final   Klebsiella aerogenes NOT DETECTED NOT DETECTED Final   Klebsiella oxytoca NOT DETECTED NOT DETECTED Final   Klebsiella pneumoniae NOT DETECTED NOT DETECTED Final   Proteus species NOT DETECTED NOT DETECTED Final   Salmonella species NOT DETECTED NOT DETECTED Final   Serratia marcescens DETECTED (A) NOT DETECTED Final    Comment: CRITICAL RESULT CALLED TO, READ BACK BY AND VERIFIED WITH: PHARMD E MARTIN [id number] AT 1330 BY CM    Haemophilus influenzae NOT DETECTED NOT DETECTED Final   Neisseria meningitidis NOT DETECTED NOT DETECTED Final   Pseudomonas aeruginosa NOT DETECTED NOT DETECTED Final   Stenotrophomonas maltophilia NOT DETECTED NOT DETECTED Final   Candida albicans NOT DETECTED NOT DETECTED Final   Candida auris NOT DETECTED NOT DETECTED Final   Candida glabrata NOT DETECTED NOT DETECTED Final   Candida  krusei NOT DETECTED NOT DETECTED Final   Candida parapsilosis NOT DETECTED NOT DETECTED Final   Candida tropicalis NOT DETECTED NOT DETECTED Final   Cryptococcus neoformans/gattii NOT DETECTED NOT DETECTED Final   CTX-M ESBL NOT DETECTED NOT DETECTED Final   Carbapenem resistance IMP NOT DETECTED NOT DETECTED Final   Carbapenem resistance KPC NOT DETECTED NOT DETECTED Final   Carbapenem resistance NDM NOT DETECTED NOT DETECTED Final   Carbapenem resist OXA 48 LIKE NOT DETECTED NOT DETECTED Final   Carbapenem resistance VIM NOT DETECTED NOT DETECTED Final    Comment: Performed at [facility], [address], [place], [place] [address]  Culture, blood (routine x 2)     Status: Abnormal   Collection Time: 12/03/21 10:36 PM   Specimen: BLOOD  Result Value Ref Range Status   Specimen Description BLOOD LEFT ANTECUBITAL  Final   Special Requests   Final  BOTTLES DRAWN AEROBIC AND ANAEROBIC Blood Culture adequate volume   Culture  Setup Time (A)  Final    GRAM VARIABLE ROD IN BOTH AEROBIC AND ANAEROBIC BOTTLES CRITICAL VALUE NOTED.  VALUE IS CONSISTENT WITH PREVIOUSLY REPORTED AND CALLED VALUE.    Culture (A)  Final    SERRATIA MARCESCENS SUSCEPTIBILITIES PERFORMED ON PREVIOUS CULTURE WITHIN THE LAST 5 DAYS. Performed at [facility], [place] [address], [place], [place] [address]    Report Status 12/07/2021 FINAL  Final  Resp Panel by RT-PCR (Flu A&B, Covid) Nasopharyngeal Swab     Status: None   Collection Time: 12/03/21 10:59 PM   Specimen: Nasopharyngeal Swab; Nasopharyngeal(NP) swabs in vial transport medium  Result Value Ref Range Status   SARS Coronavirus 2 by RT PCR NEGATIVE NEGATIVE Final    Comment: (NOTE) SARS-CoV-2 target nucleic acids are NOT DETECTED.  The SARS-CoV-2 RNA is generally detectable in upper respiratory specimens during the acute phase of infection. The lowest concentration of SARS-CoV-2 viral copies this assay can detect is 138 copies/mL. A  negative result does not preclude SARS-Cov-2 infection and should not be used as the sole basis for treatment or other patient management decisions. A negative result may occur with  improper specimen collection/handling, submission of specimen other than nasopharyngeal swab, presence of viral mutation(s) within the areas targeted by this assay, and inadequate number of viral copies(<138 copies/mL). A negative result must be combined with clinical observations, patient history, and epidemiological information. The expected result is Negative.  Fact Sheet for Patients:  EntrepreneurPulse.com.au  Fact Sheet for Healthcare Providers:  IncredibleEmployment.be  This test is no t yet approved or cleared by the [place] FDA and  has been authorized for detection and/or diagnosis of SARS-CoV-2 by FDA under an Emergency Use Authorization (EUA). This EUA will remain  in effect (meaning this test can be used) for the duration of the COVID-19 declaration under Section 564(b)(1) of the Act, 21 U.S.C.section 360bbb-3(b)(1), unless the authorization is terminated  or revoked sooner.       Influenza A by PCR NEGATIVE NEGATIVE Final   Influenza B by PCR NEGATIVE NEGATIVE Final    Comment: (NOTE) The Xpert Xpress SARS-CoV-2/FLU/RSV plus assay is intended as an aid in the diagnosis of influenza from Nasopharyngeal swab specimens and should not be used as a sole basis for treatment. Nasal washings and aspirates are unacceptable for Xpert Xpress SARS-CoV-2/FLU/RSV testing.  Fact Sheet for Patients: EntrepreneurPulse.com.au  Fact Sheet for Healthcare Providers: IncredibleEmployment.be  This test is not yet approved or cleared by the [place] FDA and has been authorized for detection and/or diagnosis of SARS-CoV-2 by FDA under an Emergency Use Authorization (EUA). This EUA will remain in effect (meaning this test can  be used) for the duration of the COVID-19 declaration under Section 564(b)(1) of the Act, 21 U.S.C. section 360bbb-3(b)(1), unless the authorization is terminated or revoked.  Performed at [facility], [place] [address] [place], [place] [address]       Radiology Studies: CT TIBIA FIBULA RIGHT WO CONTRAST  Result Date: 12/07/2021 CLINICAL DATA:  Indeterminate bone lesion, sepsis EXAM: CT OF THE LOWER RIGHT EXTREMITY WITHOUT CONTRAST TECHNIQUE: Multidetector CT imaging of the right lower extremity was performed according to the standard protocol. RADIATION DOSE REDUCTION: This exam was performed according to the departmental dose-optimization program which includes automated exposure control, adjustment of the mA and/or kV according to patient size and/or use of iterative reconstruction technique. COMPARISON:  None. FINDINGS:  Bones/Joint/Cartilage A well-circumscribed subarticular cystic lesion demonstrating sclerotic margins and no significant matrix is seen within the proximal tibia subjacent to the a medial intercondylar spine most in keeping with a simple bone cyst. No other focal osseous rib lesions or erosions are identified. No abnormal periosteal reaction. No fracture. Mild degenerative arthritis is present within the medial compartment and, to a lesser extent, the patellofemoral compartment with asymmetric joint space narrowing and subchondral sclerosis. Ligaments Suboptimally assessed by CT. Muscles and Tendons Unremarkable. Soft tissues Mild prepatellar subcutaneous edema. Mild circumferential subcutaneous edema within the right ankle in visualized right hindfoot. No discrete drainable subcutaneous fluid collection identified. 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 No aggressive bone lesions or erosions identified. Prepatellar and peripheral subcutaneous edema. No subcutaneous loculated fluid collections identified. Electronically Signed   By: Fidela Salisbury  M.D.   On: 12/07/2021 03:09     Marzetta Board, MD, PhD [facility]  Between 7 am - 7 pm I am available, please contact me via Amion (for emergencies) or Securechat (non urgent messages)  Between 7 pm - 7 am I am not available, please contact night coverage MD/APP via Amion

## 2021-12-07 NOTE — Progress Notes (Signed)
BACK TO ROOM SLEEPING AT THIS TIME , PT MOTHER AT BEDSIDE NO COMPLAINTS.

## 2021-12-07 NOTE — Significant Event (Signed)
Patient complains of severe pain in the right calf rating it as 10 x 10.  On exam at bedside the calf is mildly warm to touch.  Has bounding pulse in the dorsalis pedis artery and no limitation of function of the leg.  Pain relieved with pain relief medication.  We will get a CT scan of the right tibia-fibula area without contrast and also check Doppler.  Bradley Colon

## 2021-12-07 NOTE — Progress Notes (Signed)
Transferred to Ct scan via wheelchair with tele. Mother remains at bedside. Pt reports pain rectal and sitting sideway in wheelchair. Stat Ct scan ordered. Pt mother upset stated why is the ct scan during the night and cant wait till tomorrow.

## 2021-12-08 ENCOUNTER — Inpatient Hospital Stay (HOSPITAL_COMMUNITY): Payer: Medicaid Other

## 2021-12-08 DIAGNOSIS — M79661 Pain in right lower leg: Secondary | ICD-10-CM

## 2021-12-08 DIAGNOSIS — R6520 Severe sepsis without septic shock: Secondary | ICD-10-CM | POA: Diagnosis not present

## 2021-12-08 DIAGNOSIS — A419 Sepsis, unspecified organism: Secondary | ICD-10-CM | POA: Diagnosis not present

## 2021-12-08 LAB — BASIC METABOLIC PANEL
Anion gap: 12 (ref 5–15)
BUN: 13 mg/dL (ref 6–20)
CO2: 22 mmol/L (ref 22–32)
Calcium: 8.8 mg/dL — ABNORMAL LOW (ref 8.9–10.3)
Chloride: 102 mmol/L (ref 98–111)
Creatinine, Ser: 1.8 mg/dL — ABNORMAL HIGH (ref 0.61–1.24)
GFR, Estimated: 45 mL/min — ABNORMAL LOW (ref >60)
Glucose, Bld: 88 mg/dL (ref 70–99)
Potassium: 4.5 mmol/L (ref 3.5–5.1)
Sodium: 136 mmol/L (ref 135–145)

## 2021-12-08 LAB — GLUCOSE, CAPILLARY: Glucose-Capillary: 91 mg/dL (ref 70–99)

## 2021-12-08 LAB — RENAL FUNCTION PANEL
Albumin: 2.2 g/dL — ABNORMAL LOW (ref 3.5–5.0)
Anion gap: 9 (ref 5–15)
BUN: 12 mg/dL (ref 6–20)
CO2: 23 mmol/L (ref 22–32)
Calcium: 8.7 mg/dL — ABNORMAL LOW (ref 8.9–10.3)
Chloride: 103 mmol/L (ref 98–111)
Creatinine, Ser: 1.75 mg/dL — ABNORMAL HIGH (ref 0.61–1.24)
GFR, Estimated: 46 mL/min — ABNORMAL LOW (ref >60)
Glucose, Bld: 90 mg/dL (ref 70–99)
Phosphorus: 3.4 mg/dL (ref 2.5–4.6)
Potassium: 4.5 mmol/L (ref 3.5–5.1)
Sodium: 135 mmol/L (ref 135–145)

## 2021-12-08 LAB — CBC
HCT: 26.7 % — ABNORMAL LOW (ref 39.0–52.0)
Hemoglobin: 8.4 g/dL — ABNORMAL LOW (ref 13.0–17.0)
MCH: 26.8 pg (ref 26.0–34.0)
MCHC: 31.5 g/dL (ref 30.0–36.0)
MCV: 85 fL (ref 80.0–100.0)
Platelets: 512 10*3/uL — ABNORMAL HIGH (ref 150–400)
RBC: 3.14 MIL/uL — ABNORMAL LOW (ref 4.22–5.81)
RDW: 15.2 % (ref 11.5–15.5)
WBC: 13 10*3/uL — ABNORMAL HIGH (ref 4.0–10.5)
nRBC: 0 % (ref 0.0–0.2)

## 2021-12-08 MED ORDER — HYDROMORPHONE HCL 1 MG/ML IJ SOLN
0.5000 mg | Freq: Once | INTRAMUSCULAR | Status: DC
Start: 2021-12-08 — End: 2021-12-08

## 2021-12-08 MED ORDER — MORPHINE SULFATE ER 30 MG PO TBCR
60.0000 mg | EXTENDED_RELEASE_TABLET | Freq: Two times a day (BID) | ORAL | Status: DC
  Administered 2021-12-08: 60 mg via ORAL
  Filled 2021-12-08: qty 2

## 2021-12-08 NOTE — Progress Notes (Signed)
PHARMACY CONSULT NOTE FOR renal adjustment and OPAT:  Current antimicrobial regimen includes a mismatch between antimicrobial dosage and estimated renal function.  As per policy approved by the Pharmacy & Therapeutics and Medical Executive Committees, the antimicrobial dosage will be adjusted accordingly.  Current antimicrobial dosage:  cefepime 2g IV q 24h  Indication: Serratia Bacteremia  Renal Function:  Estimated Creatinine Clearance: 52.2 mL/min (A) (by C-G formula based on SCr of 1.75 mg/dL (H)). []      On intermittent HD, scheduled: []      On CRRT    Antimicrobial dosage has been changed to:  cefepime 2g IV q 12h (for CrCl 30-60) -----------------------------------------------------------------------------------  OUTPATIENT  PARENTERAL ANTIBIOTIC THERAPY (OPAT)  Indication: Serratia Bacteremia Regimen: Cefepime 2g IV every 12 hours End date: 12/10/21  OPAT orders updated. IV antibiotic discharge orders are pended. To discharging provider:  please sign these orders via discharge navigator,  Select New Orders & click on the button choice - Manage This Unsigned Work.    Thank you for allowing pharmacy to participate in this patient's care.  Levonne Spiller, PharmD PGY1 Acute Care Resident  12/08/2021,8:48 AM  **Pharmacist phone directory can now be found on [email or web address] ([email or web address]).  Listed under [place].

## 2021-12-08 NOTE — Progress Notes (Signed)
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  He has been following with Drs. Sherril and [facility].  Patient is S/P low anterior resection with diverting loop ileostomy and ablation of liver metastasis 11/2019 and has received radiation as well as multiple courses of chemotherapy with the most recent dose of chemotherapy (FOLFIRI/panitumubmab) in 09/2021 with recent complications of small bowel obstruction requiring hospitalization at [facility] 12/10-12/10/2021 and most recently, development of urinary retention and overall incontinence having been seen by [facility] urology on 1/25 requiring intermittent catheterization. Patient was brought into [facility] emergency department via EMS after experiencing an episode of seizure-like activity.  He was found to be febrile in the ED, have a UTI, also have acute renal failure with hyperkalemia.  Subjective / 24h Interval events: -Very angry and raising his voice this morning, wants to go home.  Although I fully explained the reason why he cannot like to due to inability to set up home health over the weekend, tells me he will leave anyway  Assessment & Plan: * Severe sepsis ([place]) due to UTI / pyelonephritis- (present on admission) -likely due to UTI as well as Serratia bacteremia, he has been recently diagnosed with a urinary retention, had a cystoscopy 1/25 with [place] [facility], he was instructed to self catheterize but apparently has not been doing this consistently.  He is septic on admission with fever, elevated WBC and a UA suggesting urinary tract infection.  He was placed on broad-spectrum antibiotics, continue.  ID consulted, recommending 7 days of cefepime, last dose on 2/7.  Could be sent home today, however  per case manager, could not set up home health over the weekend due to insurance preauthorization.  Unfortunately he is very insistent that he is discharged today, warned him that this is not advisable and could potentially result in severe complications, he will think about it but may still elect to leave today  Acute kidney injury (AKI) with acute tubular necrosis (ATN) ([place])- (present on admission) -likely obstructive in the setting of retention.  He has had a Foley catheter placed, continue.  Nephrology consulted and followed patient.  Kidney function continues to improve, will have Foley catheter on discharge  Acute metabolic encephalopathy- (present on admission) -in the setting of sepsis, uremia.  Mental status has returned to baseline  Hyperkalemia- (present on admission) -due to AKI, [place], calcium gluconate, dextrose and insulin. Nephrology following.  Potassium normalized now  Metabolic acidosis- (present on admission) -Due to renal failure, now resolved  Bacterial infection due to Serratia - ID following.  Has a port in place  Hypoglycemia- (present on admission) -due to sepsis, resolved  CBG (last 3)  Recent Labs    12/07/21 1552 12/07/21 2123 12/08/21 0735  GLUCAP 134* 100* 91    Grand mal seizure ([place])- (present on admission) -likely in the setting of febrile illness/sepsis.  Neurology consulted, appreciate input. EEG negative 2/1.  Recommending to continue [place] for 10 total days  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 [facility])- (present on admission) -outpatient management     Scheduled Meds:  Chlorhexidine Gluconate Cloth  6 each Topical Daily   heparin  5,000 Units Subcutaneous Q8H    HYDROmorphone (DILAUDID) injection  0.5 mg Intravenous Once   morphine  60 mg  Oral Q12H   pantoprazole (PROTONIX) IV  40 mg Intravenous Q24H   tamsulosin  0.8 mg Oral Daily   Continuous Infusions:  ceFEPime (MAXIPIME) IV 2 g (12/08/21 0550)   levETIRAcetam 500 mg  (12/07/21 2306)   PRN Meds:.acetaminophen **OR** acetaminophen, ondansetron **OR** ondansetron (ZOFRAN) IV, oxyCODONE, polyethylene glycol  Diet Orders (From admission, onward)     Start     Ordered   12/04/21 4315  Diet vegetarian Room service appropriate? Yes; Fluid consistency: Thin  Diet effective now       Question Answer Comment  Room service appropriate? Yes   Fluid consistency: Thin      12/04/21 0636            DVT prophylaxis: heparin injection 5,000 Units Start: 12/04/21 0600   Lab Results  Component Value Date   PLT 512 (H) 12/08/2021      Code Status: Full Code  Family Communication: His mother is present at bedside  Status is: Inpatient  Remains inpatient appropriate because: Unable to set up home health, otherwise he is stable for discharge  Level of care: Progressive  Consultants:  ID Nephrology  Microbiology  Blood cultures - Serratia  Antimicrobials: Cefepime     Objective: Vitals:   12/08/21 0619 12/08/21 0700 12/08/21 0702 12/08/21 0733  BP:    (!) 141/73  Pulse: 67 (!) 59 60 78  Resp: 16 17 14 17   Temp:    98 F (36.7 C)  TempSrc:    Oral  SpO2: 96% 95% 96% 96%  Weight:      Height:        Intake/Output Summary (Last 24 hours) at 12/08/2021 1041 Last data filed at 12/08/2021 0600 Gross per 24 hour  Intake --  Output 1950 ml  Net -1950 ml    Wt Readings from Last 3 Encounters:  12/04/21 87.7 kg  12/03/21 87.7 kg  10/07/21 86.6 kg    Examination:  Constitutional: NAD Eyes: lids and conjunctivae normal, no scleral icterus ENMT: mmm Neck: normal, supple Respiratory: clear to auscultation bilaterally, no wheezing, no crackles. Normal respiratory effort.  Cardiovascular: Regular rate and rhythm, no murmurs / rubs / gallops. No LE edema. Abdomen: soft, no distention, no tenderness. Bowel sounds positive.  Skin: no rashes Neurologic: no focal deficits, equal strength   Data Reviewed: I have independently reviewed  following labs and imaging studies  CBC Recent Labs  Lab 12/03/21 2228 12/03/21 2232 12/03/21 2327 12/05/21 0146 12/06/21 0046 12/07/21 0100 12/08/21 0052  WBC 12.4*  --   --  16.7* 15.1* 15.6* 13.0*  HGB 9.8*   < > 10.9* 8.8* 8.4* 9.1* 8.4*  HCT 30.6*   < > 32.0* 27.0* 26.4* 28.2* 26.7*  PLT 558*  --   --  476* 482* 518* 512*  MCV 85.2  --   --  83.1 84.3 83.9 85.0  MCH 27.3  --   --  27.1 26.8 27.1 26.8  MCHC 32.0  --   --  32.6 31.8 32.3 31.5  RDW 15.4  --   --  15.3 15.5 15.5 15.2  LYMPHSABS 0.4*  --   --  0.7  --   --   --   MONOABS 0.2  --   --  1.1*  --   --   --   EOSABS 0.1  --   --  0.2  --   --   --   BASOSABS 0.0  --   --  0.0  --   --   --    < > =  values in this interval not displayed.     Recent Labs  Lab 12/03/21 2228 12/03/21 2231 12/03/21 2232 12/04/21 0123 12/04/21 0452 12/04/21 2020 12/05/21 0146 12/06/21 0046 12/07/21 0100 12/08/21 0052  NA 131*  --    < >  --    < > 136 139 139 138   136 136   135  K >7.5*  --    < >  --    < > 6.2* 5.8* 4.5 4.5   4.4 4.5   4.5  CL 97*  --    < >  --    < > 101 104 105 105   103 102   103  CO2 18*  --   --   --    < > 21* 24 25 24    21* 22   23  GLUCOSE 80  --    < >  --    < > 125* 149* 107* 90   86 88   90  BUN 89*  --    < >  --    < > 57* 52* 28* 17   17 13   12   CREATININE 14.24*  --    < >  --    < > 7.92* 6.69* 3.62* 2.26*   2.29* 1.80*   1.75*  CALCIUM 9.2  --   --   --    < > 9.2 8.8* 8.6* 8.9   8.6* 8.8*   8.7*  AST 22  --   --   --   --   --  26 33  --   --   ALT 18  --   --   --   --   --  14 21  --   --   ALKPHOS 72  --   --   --   --   --  77 96  --   --   BILITOT 0.7  --   --   --   --   --  0.5 0.4  --   --   ALBUMIN 2.9*  --   --   --    < > 2.6* 2.1* 2.1* 2.2* 2.2*  MG  --   --   --   --   --   --  1.7 1.4*  --   --   LATICACIDVEN  --  1.1  --  1.7  --   --   --   --   --   --   INR 1.2  --   --   --   --   --   --   --   --   --    < > = values in this interval not displayed.      ------------------------------------------------------------------------------------------------------------------ No results for input(s): CHOL, HDL, LDLCALC, TRIG, CHOLHDL, LDLDIRECT in the last 72 hours.  Lab Results  Component Value Date   HGBA1C 5.50 11/19/2015   ------------------------------------------------------------------------------------------------------------------ No results for input(s): TSH, T4TOTAL, T3FREE, THYROIDAB in the last 72 hours.  Invalid input(s): FREET3  Cardiac Enzymes No results for input(s): CKMB, TROPONINI, MYOGLOBIN in the last 168 hours.  Invalid input(s): CK ------------------------------------------------------------------------------------------------------------------ No results found for: BNP  CBG: Recent Labs  Lab 12/07/21 0734 12/07/21 1136 12/07/21 1552 12/07/21 2123 12/08/21 0735  GLUCAP 111* 84 134* 100* 91     Recent Results (from the past 240 hour(s))  Urine Culture     Status: Abnormal  Collection Time: 12/03/21  1:32 AM   Specimen: In/Out Cath Urine  Result Value Ref Range Status   Specimen Description IN/OUT CATH URINE  Final   Special Requests   Final    NONE Performed at [facility], [place] [address] [place], [place] [address]    Culture >=100,000 COLONIES/mL SERRATIA MARCESCENS (A)  Final   Report Status 12/06/2021 FINAL  Final   Organism ID, Bacteria SERRATIA MARCESCENS (A)  Final      Susceptibility   Serratia marcescens - MIC*    CEFAZOLIN >=64 RESISTANT Resistant     CEFEPIME <=0.12 SENSITIVE Sensitive     CEFTRIAXONE <=0.25 SENSITIVE Sensitive     CIPROFLOXACIN <=0.25 SENSITIVE Sensitive     GENTAMICIN <=1 SENSITIVE Sensitive     NITROFURANTOIN 256 RESISTANT Resistant     TRIMETH/SULFA <=20 SENSITIVE Sensitive     * >=100,000 COLONIES/mL SERRATIA MARCESCENS  Culture, blood (routine x 2)     Status: Abnormal   Collection Time: 12/03/21 10:31 PM   Specimen: BLOOD  Result Value Ref Range  Status   Specimen Description BLOOD RIGHT ANTECUBITAL  Final   Special Requests   Final    BOTTLES DRAWN AEROBIC AND ANAEROBIC Blood Culture adequate volume   Culture  Setup Time (A)  Final    GRAM VARIABLE ROD IN BOTH AEROBIC AND ANAEROBIC BOTTLES CRITICAL RESULT CALLED TO, READ BACK BY AND VERIFIED WITH: PHARMD E MARTIN [id number] AT 1328 BY CM Performed at [facility], [place] [address] [place], [place] [address]    Culture SERRATIA MARCESCENS (A)  Final   Report Status 12/07/2021 FINAL  Final   Organism ID, Bacteria SERRATIA MARCESCENS  Final      Susceptibility   Serratia marcescens - MIC*    CEFAZOLIN >=64 RESISTANT Resistant     CEFEPIME <=0.12 SENSITIVE Sensitive     CEFTAZIDIME <=1 SENSITIVE Sensitive     CEFTRIAXONE <=0.25 SENSITIVE Sensitive     CIPROFLOXACIN <=0.25 SENSITIVE Sensitive     GENTAMICIN <=1 SENSITIVE Sensitive     TRIMETH/SULFA <=20 SENSITIVE Sensitive     * SERRATIA MARCESCENS  Blood Culture ID Panel (Reflexed)     Status: Abnormal   Collection Time: 12/03/21 10:31 PM  Result Value Ref Range Status   Enterococcus faecalis NOT DETECTED NOT DETECTED Final   Enterococcus Faecium NOT DETECTED NOT DETECTED Final   Listeria monocytogenes NOT DETECTED NOT DETECTED Final   Staphylococcus species NOT DETECTED NOT DETECTED Final   Staphylococcus aureus (BCID) NOT DETECTED NOT DETECTED Final   Staphylococcus epidermidis NOT DETECTED NOT DETECTED Final   Staphylococcus lugdunensis NOT DETECTED NOT DETECTED Final   Streptococcus species NOT DETECTED NOT DETECTED Final   Streptococcus agalactiae NOT DETECTED NOT DETECTED Final   Streptococcus pneumoniae NOT DETECTED NOT DETECTED Final   Streptococcus pyogenes NOT DETECTED NOT DETECTED Final   A.calcoaceticus-baumannii NOT DETECTED NOT DETECTED Final   Bacteroides fragilis NOT DETECTED NOT DETECTED Final   Enterobacterales DETECTED (A) NOT DETECTED Final    Comment: Enterobacterales represent a large order of gram  negative bacteria, not a single organism. CRITICAL RESULT CALLED TO, READ BACK BY AND VERIFIED WITH: PHARMD E MARTIN [id number] AT 1330 BY CM    Enterobacter cloacae complex NOT DETECTED NOT DETECTED Final   Escherichia coli NOT DETECTED NOT DETECTED Final   Klebsiella aerogenes NOT DETECTED NOT DETECTED Final   Klebsiella oxytoca NOT DETECTED NOT DETECTED Final   Klebsiella pneumoniae NOT DETECTED NOT DETECTED Final  Proteus species NOT DETECTED NOT DETECTED Final   Salmonella species NOT DETECTED NOT DETECTED Final   Serratia marcescens DETECTED (A) NOT DETECTED Final    Comment: CRITICAL RESULT CALLED TO, READ BACK BY AND VERIFIED WITH: PHARMD E MARTIN [id number] AT 1330 BY CM    Haemophilus influenzae NOT DETECTED NOT DETECTED Final   Neisseria meningitidis NOT DETECTED NOT DETECTED Final   Pseudomonas aeruginosa NOT DETECTED NOT DETECTED Final   Stenotrophomonas maltophilia NOT DETECTED NOT DETECTED Final   Candida albicans NOT DETECTED NOT DETECTED Final   Candida auris NOT DETECTED NOT DETECTED Final   Candida glabrata NOT DETECTED NOT DETECTED Final   Candida krusei NOT DETECTED NOT DETECTED Final   Candida parapsilosis NOT DETECTED NOT DETECTED Final   Candida tropicalis NOT DETECTED NOT DETECTED Final   Cryptococcus neoformans/gattii NOT DETECTED NOT DETECTED Final   CTX-M ESBL NOT DETECTED NOT DETECTED Final   Carbapenem resistance IMP NOT DETECTED NOT DETECTED Final   Carbapenem resistance KPC NOT DETECTED NOT DETECTED Final   Carbapenem resistance NDM NOT DETECTED NOT DETECTED Final   Carbapenem resist OXA 48 LIKE NOT DETECTED NOT DETECTED Final   Carbapenem resistance VIM NOT DETECTED NOT DETECTED Final    Comment: Performed at [facility], [place] [address] [place], [place] [address]  Culture, blood (routine x 2)     Status: Abnormal   Collection Time: 12/03/21 10:36 PM   Specimen: BLOOD  Result Value Ref Range Status   Specimen Description BLOOD LEFT ANTECUBITAL   Final   Special Requests   Final    BOTTLES DRAWN AEROBIC AND ANAEROBIC Blood Culture adequate volume   Culture  Setup Time (A)  Final    GRAM VARIABLE ROD IN BOTH AEROBIC AND ANAEROBIC BOTTLES CRITICAL VALUE NOTED.  VALUE IS CONSISTENT WITH PREVIOUSLY REPORTED AND CALLED VALUE.    Culture (A)  Final    SERRATIA MARCESCENS SUSCEPTIBILITIES PERFORMED ON PREVIOUS CULTURE WITHIN THE LAST 5 DAYS. Performed at [facility], [place] [address], [place], [place] [address]    Report Status 12/07/2021 FINAL  Final  Resp Panel by RT-PCR (Flu A&B, Covid) Nasopharyngeal Swab     Status: None   Collection Time: 12/03/21 10:59 PM   Specimen: Nasopharyngeal Swab; Nasopharyngeal(NP) swabs in vial transport medium  Result Value Ref Range Status   SARS Coronavirus 2 by RT PCR NEGATIVE NEGATIVE Final    Comment: (NOTE) SARS-CoV-2 target nucleic acids are NOT DETECTED.  The SARS-CoV-2 RNA is generally detectable in upper respiratory specimens during the acute phase of infection. The lowest concentration of SARS-CoV-2 viral copies this assay can detect is 138 copies/mL. A negative result does not preclude SARS-Cov-2 infection and should not be used as the sole basis for treatment or other patient management decisions. A negative result may occur with  improper specimen collection/handling, submission of specimen other than nasopharyngeal swab, presence of viral mutation(s) within the areas targeted by this assay, and inadequate number of viral copies(<138 copies/mL). A negative result must be combined with clinical observations, patient history, and epidemiological information. The expected result is Negative.  Fact Sheet for Patients:  EntrepreneurPulse.com.au  Fact Sheet for Healthcare Providers:  IncredibleEmployment.be  This test is no t yet approved or cleared by the [place] FDA and  has been authorized for detection and/or diagnosis of SARS-CoV-2  by FDA under an Emergency Use Authorization (EUA). This EUA will remain  in effect (meaning this test can be used) for the duration of  the COVID-19 declaration under Section 564(b)(1) of the Act, 21 U.S.C.section 360bbb-3(b)(1), unless the authorization is terminated  or revoked sooner.       Influenza A by PCR NEGATIVE NEGATIVE Final   Influenza B by PCR NEGATIVE NEGATIVE Final    Comment: (NOTE) The Xpert Xpress SARS-CoV-2/FLU/RSV plus assay is intended as an aid in the diagnosis of influenza from Nasopharyngeal swab specimens and should not be used as a sole basis for treatment. Nasal washings and aspirates are unacceptable for Xpert Xpress SARS-CoV-2/FLU/RSV testing.  Fact Sheet for Patients: EntrepreneurPulse.com.au  Fact Sheet for Healthcare Providers: IncredibleEmployment.be  This test is not yet approved or cleared by the [place] FDA and has been authorized for detection and/or diagnosis of SARS-CoV-2 by FDA under an Emergency Use Authorization (EUA). This EUA will remain in effect (meaning this test can be used) for the duration of the COVID-19 declaration under Section 564(b)(1) of the Act, 21 U.S.C. section 360bbb-3(b)(1), unless the authorization is terminated or revoked.  Performed at [facility], [place] [address] [place], [place] [address]       Radiology Studies: VAS [place] LOWER EXTREMITY VENOUS (DVT)  Result Date: 12/08/2021  Lower Venous DVT Study Patient Name:  KAIL FRALEY  Date of Exam:   12/08/2021 Medical Rec #: [id number]   Accession #:    [id number] Date of Birth: 1971-10-06  Patient Gender: M Patient Age:   49 years Exam Location:  [facility] Procedure:      VAS [place] LOWER EXTREMITY VENOUS (DVT) Referring Phys: Gean Birchwood --------------------------------------------------------------------------------  Indications: Pain.  Comparison Study: prior neg-12/04/21 Performing Technologist: Archie Patten RVS   Examination Guidelines: A complete evaluation includes B-mode imaging, spectral Doppler, color Doppler, and power Doppler as needed of all accessible portions of each vessel. Bilateral testing is considered an integral part of a complete examination. Limited examinations for reoccurring indications may be performed as noted. The reflux portion of the exam is performed with the patient in reverse Trendelenburg.  +---------+---------------+---------+-----------+----------+--------------+  RIGHT     Compressibility Phasicity Spontaneity Properties Thrombus Aging  +---------+---------------+---------+-----------+----------+--------------+  CFV       Full            Yes       Yes                                    +---------+---------------+---------+-----------+----------+--------------+  SFJ       Full                                                             +---------+---------------+---------+-----------+----------+--------------+  FV Prox   Full                                                             +---------+---------------+---------+-----------+----------+--------------+  FV Mid    Full                                                             +---------+---------------+---------+-----------+----------+--------------+  FV Distal Full                                                             +---------+---------------+---------+-----------+----------+--------------+  PFV       Full                                                             +---------+---------------+---------+-----------+----------+--------------+  POP       Full            Yes       Yes                                    +---------+---------------+---------+-----------+----------+--------------+  PTV       Full                                                             +---------+---------------+---------+-----------+----------+--------------+  PERO      Full                                                              +---------+---------------+---------+-----------+----------+--------------+   +----+---------------+---------+-----------+----------+--------------+  LEFT Compressibility Phasicity Spontaneity Properties Thrombus Aging  +----+---------------+---------+-----------+----------+--------------+  CFV  Full            Yes       Yes                                    +----+---------------+---------+-----------+----------+--------------+     Summary: RIGHT: - Findings appear essentially unchanged compared to previous examination. - There is no evidence of deep vein thrombosis in the lower extremity.  - No cystic structure found in the popliteal fossa.  LEFT: - No evidence of common femoral vein obstruction.  *See table(s) above for measurements and observations.    Preliminary      Marzetta Board, MD, PhD [facility]  Between 7 am - 7 pm I am available, please contact me via Amion (for emergencies) or Securechat (non urgent messages)  Between 7 pm - 7 am I am not available, please contact night coverage MD/APP via Amion

## 2021-12-08 NOTE — Progress Notes (Signed)
RN at bedside. Patient irritable but free of pain. Patient educated on consequence of leaving AMA; patient pleasantly requests to sign AMA.  IV + telemonitoring connections removed per policy. Foley catheter removed; patient tolerated well. AMA papers signed by patient + RN as witness. Charge RN aware.

## 2021-12-08 NOTE — Progress Notes (Signed)
Lower extremity venous has been completed.   Preliminary results in CV Proc.   Bradley Colon Aastha Dayley 12/08/2021 10:25 AM

## 2021-12-08 NOTE — Discharge Summary (Addendum)
Physician Against Medical Advice Discharge Summary  Bradley Colon CWC:[id number] DOB: 08-Oct-1971 DOA: 12/03/2021  PCP: Merton Border, PA  Admit date: 12/03/2021 Discharge date: 12/08/2021  Admitted From: home Disposition:  LEFT AGAINST MEDICAL ADVICE  Recommendations for Outpatient Follow-up:  Follow up with PCP ASAP  Home Health: could not be set up on the weekend due to patient's insurance Equipment/Devices: none  Discharge Condition: guarded, left AMA with incompletely treated bacteremia CODE STATUS: Full code Diet recommendation: regular  HPI:  51 year old medically complex male with past medical history of 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  He has been following with Drs. Sherril and [employer].  Patient is S/P low anterior resection with diverting loop ileostomy and ablation of liver metastasis 11/2019 and has received radiation as well as multiple courses of chemotherapy with the most recent dose of chemotherapy (FOLFIRI/panitumubmab) in 09/2021 with recent complications of small bowel obstruction requiring hospitalization at [facility] 12/10-12/10/2021 and most recently, development of urinary retention and overall incontinence having been seen by [facility] urology on 1/25 requiring intermittent catheterization. Patient was brought into [facility] emergency department via EMS after experiencing an episode of seizure-like activity.  He was found to be febrile in the ED, have a UTI, also have acute renal failure with hyperkalemia.  Hospital Course / Discharge diagnoses: * Severe sepsis ([place]) due to UTI / pyelonephritis- (present on admission) -likely due to UTI due to self catheterization also with Serratia bacteremia. he has been recently diagnosed with a urinary retention, had a cystoscopy 1/25 with [place] u[facility], he was instructed to self catheterize but apparently has not been doing this consistently.  He was septic on admission with fever,  elevated WBC and a UA suggesting urinary tract infection.  He was placed on broad-spectrum antibiotics, continue.  ID consulted, recommending 7 days of cefepime, last dose on 2/7. He could not be set up home health over the weekend due to insurance preauthorization and patient was supposed to be discharged on Monday, however, on Sunday, he was very insistent that he is discharged. Patient has been warned that this is not Medically advisable at this time, and can result in Medical complications like Death and Disability, patient understands and accepts the risks involved and assumes full responsibilty of this decision.   Acute kidney injury (AKI) with acute tubular necrosis (ATN) (HCC)- (present on admission) -likely obstructive in the setting of retention.  He has had a Foley catheter placed, continue.  Nephrology consulted and followed patient.  Kidney function continues to improve, will have Foley catheter on discharge  Acute metabolic encephalopathy- (present on admission) -in the setting of sepsis, uremia.  Mental status has returned to baseline  Hyperkalemia- (present on admission) -due to AKI, [place], calcium gluconate, dextrose and insulin. Nephrology following.  Potassium normalized now  Metabolic acidosis- (present on admission) -Due to renal failure, now resolved  Bacterial infection due to Serratia - ID following.  Has a port in place  Hypoglycemia- (present on admission) -due to sepsis, resolved  CBG (last 3)  Recent Labs    12/07/21 1552 12/07/21 2123 12/08/21 0735  GLUCAP 134* 100* 91    Grand mal seizure ([place])- (present on admission) -likely in the setting of febrile illness/sepsis.  Neurology consulted, appreciate input. EEG negative 2/1.  Recommending to continue [place] for 10 total days  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 [facility])- (present on admission) -outpatient management    Sepsis ruled out   Discharge Instructions  Discharge Instructions      [facility] pharmacist to adjust dose for Vancomycin, Aminoglycosides and other anti-infective therapies as requested by physician.   Complete by: As directed    [facility] to provide Cath Flo 62m   Complete by: As directed    Administer for PICC line occlusion and as ordered by physician for other access device issues.   Anaphylaxis Kit: Provided to treat any anaphylactic reaction to the medication being provided to the patient if First Dose or when requested by physician   Complete by: As directed    Epinephrine 129mml vial / amp: Administer 0.61m21m0.61ml51mubcutaneously once for moderate to severe anaphylaxis, nurse to call physician and pharmacy when reaction occurs and call 911 if needed for immediate care   Diphenhydramine 50mg78mIV vial: Administer 25-50mg 28mM PRN for first dose reaction, rash, itching, mild reaction, nurse to call physician and pharmacy when reaction occurs   Sodium Chloride 0.9% NS 500ml I40mdminister if needed for hypovolemic blood pressure drop or as ordered by physician after call to physician with anaphylactic reaction   Change dressing on IV access line weekly and PRN   Complete by: As directed    Flush IV access with Sodium Chloride 0.9% and Heparin 10 units/ml or 100 units/ml   Complete by: As directed    Home infusion instructions - [facility]   Complete by: As directed    Instructions: Flush IV access with Sodium Chloride 0.9% and Heparin 10units/ml or 100units/ml   Change dressing on IV access line: Weekly and PRN   Instructions Cath Flo 2mg: Ad42mister for PICC Line occlusion and as ordered by physician for other access device   [facility] pharmacist to adjust dose for: Vancomycin, Aminoglycosides and other anti-infective therapies as requested by physician   Method of administration may be changed at the discretion of home infusion pharmacist based upon assessment of the patient and/or caregivers ability to  self-administer the medication ordered   Complete by: As directed       Allergies as of 12/08/2021       Reactions   Bee Venom Anaphylaxis, Swelling   Pt has been stung by honey bees since he became a vegetarian at age 20 yrs o93 with no reaction.   Other Shortness Of Breath, Itching   Reaction to cut grass, pet dander   Contrast Media [iodinated Contrast Media] Hives   Shrimp [shellfish Allergy] Swelling, Other (See Comments)   Muscle cramps   Vancomycin Itching   Reaction to IV - pt states no reaction if it is infused slowly   Wheat Bran Swelling   Some swelling   Latex Hives, Rash   Reaction to fumes from latex paint     Med Rec must be completed prior to using this [facility] Instructions  (From admission, onward)           Start     Ordered   12/07/21 0000  Change dressing on IV access line weekly and PRN  (Home infusion instructions - [facility] )        12/07/21 1247             Consultations: Neurology Oncology ID Nephrology  Procedures/Studies:  CT ABDOMEN PELVIS WO CONTRAST  Result Date: 12/04/2021 CLINICAL DATA:  Kidney failure, acute AKI. EXAM: CT ABDOMEN AND PELVIS WITHOUT CONTRAST TECHNIQUE: Multidetector CT imaging of the abdomen and pelvis was performed following  the standard protocol without IV contrast. RADIATION DOSE REDUCTION: This exam was performed according to the departmental dose-optimization program which includes automated exposure control, adjustment of the mA and/or kV according to patient size and/or use of iterative reconstruction technique. COMPARISON:  10/10/2021. FINDINGS: Lower chest: The heart is enlarged. The distal tip of a central venous catheter terminates in the right atrium. Multiple nodules are present at the lung bases, the largest in the right lower lobe measuring 3.1 cm, increased in size and number as compared with the prior exam. A cavitary nodule is present in the left lower lobe.  Hepatobiliary: No focal liver abnormality is seen. No gallstones, gallbladder wall thickening, or biliary dilatation. Pancreas: Pancreatic atrophy is noted. No pancreatic ductal dilatation or surrounding inflammatory changes. Spleen: Normal in size without focal abnormality. Adrenals/Urinary Tract: The adrenal glands are within normal limits. No renal calculus is seen bilaterally. The collecting systems are prominent bilaterally with no evidence of ureteral calculus. Mild perinephric fat stranding is noted bilaterally. The urinary bladder is distended and perivesicular fat stranding is noted. Stomach/Bowel: The stomach is within normal limits. No bowel obstruction, free air, or pneumatosis. There is a periumbilical hernia and umbilical hernia containing nonobstructed small bowel. The appendix is not visualized on exam. No focal bowel wall thickening. Vascular/Lymphatic: Aortic atherosclerosis. No enlarged abdominal or pelvic lymph nodes. Reproductive: The prostate gland is not well seen on exam and may be small. Other: No free fluid. Fat containing inguinal hernias are present bilaterally. Musculoskeletal: No acute or suspicious osseous abnormality. IMPRESSION: 1. Mild prominence of the renal collecting systems bilaterally with perinephric a periureteral fat stranding. No ureteral calculus is identified. Findings may be infectious or inflammatory. 2. The urinary bladder is distended with perivesicular fat stranding, possible infectious or inflammatory cystitis. 3. Increased size and number of pulmonary nodules at the lung bases, compatible with known metastatic disease. 4. Aortic atherosclerosis. 5. Remaining chronic findings as described above. Electronically Signed   By: Brett Fairy M.D.   On: 12/04/2021 00:49   CT Head Wo Contrast  Result Date: 12/03/2021 CLINICAL DATA:  New onset seizure. EXAM: CT HEAD WITHOUT CONTRAST TECHNIQUE: Contiguous axial images were obtained from the base of the skull through the  vertex without intravenous contrast. RADIATION DOSE REDUCTION: This exam was performed according to the departmental dose-optimization program which includes automated exposure control, adjustment of the mA and/or kV according to patient size and/or use of iterative reconstruction technique. COMPARISON:  CT head 08/17/2010. FINDINGS: Brain: No evidence of acute infarction, hemorrhage, hydrocephalus, extra-axial collection or mass lesion/mass effect. Vascular: No hyperdense vessel or unexpected calcification. Skull: Normal. Negative for fracture or focal lesion. Sinuses/Orbits: No acute finding. Other: None. IMPRESSION: No acute intracranial abnormality. Electronically Signed   By: Ronney Asters M.D.   On: 12/03/2021 23:54   MR BRAIN WO CONTRAST  Result Date: 12/05/2021 CLINICAL DATA:  51 year old male with metastatic rectal adenocarcinoma. Seizure. Query brain metastasis. EXAM: MRI HEAD WITHOUT CONTRAST TECHNIQUE: Multiplanar, multiecho pulse sequences of the brain and surrounding structures were obtained without intravenous contrast. COMPARISON:  Head CT 12/03/2021. FINDINGS: Brain: No contrast administered. No cerebral vasogenic edema or mass effect identified. No restricted diffusion to suggest acute infarction. No midline shift, ventriculomegaly, extra-axial collection or acute intracranial hemorrhage. Cervicomedullary junction and pituitary are within normal limits. On thin slice coronal imaging hippocampal formations and other mesial temporal lobe structures appear symmetric and within normal limits. Mild for age scattered nonspecific cerebral white matter T2 and FLAIR hyperintensity, mostly periventricular  and subcortical. No cortical encephalomalacia identified. No chronic cerebral blood products identified. Negative deep gray nuclei, brainstem and cerebellum. Vascular: Major intracranial vascular flow voids are preserved. Skull and upper cervical spine: Nonspecific decreased T1 marrow signal in the  cervical spine beginning at C2. Skull bone marrow signal is within normal limits. Sinuses/Orbits: Mildly Disconjugate gaze, otherwise negative orbits. Mild paranasal sinus mucosal thickening. Other: Mastoids are well aerated. Visible internal auditory structures appear normal. Negative visible scalp and face. IMPRESSION: 1. No cerebral metastatic disease or acute intracranial abnormality is evident on this non-contrast exam. But note that early metastatic disease cannot be excluded. 2. Nonspecific decreased bone marrow signal in the visible cervical spine, perhaps treatment related red marrow reactivation rather than due to bony metastatic disease. Electronically Signed   By: Genevie Ann M.D.   On: 12/05/2021 11:16   CT TIBIA FIBULA RIGHT WO CONTRAST  Result Date: 12/07/2021 CLINICAL DATA:  Indeterminate bone lesion, sepsis EXAM: CT OF THE LOWER RIGHT EXTREMITY WITHOUT CONTRAST TECHNIQUE: Multidetector CT imaging of the right lower extremity was performed according to the standard protocol. RADIATION DOSE REDUCTION: This exam was performed according to the departmental dose-optimization program which includes automated exposure control, adjustment of the mA and/or kV according to patient size and/or use of iterative reconstruction technique. COMPARISON:  None. FINDINGS: Bones/Joint/Cartilage A well-circumscribed subarticular cystic lesion demonstrating sclerotic margins and no significant matrix is seen within the proximal tibia subjacent to the a medial intercondylar spine most in keeping with a simple bone cyst. No other focal osseous rib lesions or erosions are identified. No abnormal periosteal reaction. No fracture. Mild degenerative arthritis is present within the medial compartment and, to a lesser extent, the patellofemoral compartment with asymmetric joint space narrowing and subchondral sclerosis. Ligaments Suboptimally assessed by CT. Muscles and Tendons Unremarkable. Soft tissues Mild prepatellar  subcutaneous edema. Mild circumferential subcutaneous edema within the right ankle in visualized right hindfoot. No discrete drainable subcutaneous fluid collection identified. 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 No aggressive bone lesions or erosions identified. Prepatellar and peripheral subcutaneous edema. No subcutaneous loculated fluid collections identified. Electronically Signed   By: Fidela Salisbury M.D.   On: 12/07/2021 03:09   DG Chest Port 1 View  Result Date: 12/03/2021 CLINICAL DATA:  Shortness of breath, seizure. EXAM: PORTABLE CHEST 1 VIEW COMPARISON:  Chest x-ray 07/18/2021. CT chest 07/17/2021. FINDINGS: Right chest port catheter tip projects over the cavoatrial junction, unchanged. Lung volumes are low. Bilateral pulmonary nodules are grossly unchanged. Airspace disease has increased in the right lower lung. There is no definite pleural effusion or pneumothorax. No acute fractures are seen. The heart is enlarged, unchanged. IMPRESSION: 1. Bilateral pulmonary nodules are grossly unchanged. 2. New patchy airspace disease in the right lower lung worrisome for infection. Followup PA and lateral chest X-ray is recommended in 3-4 weeks following trial of antibiotic therapy to ensure resolution and exclude underlying malignancy. Electronically Signed   By: Ronney Asters M.D.   On: 12/03/2021 23:17   EEG adult  Result Date: 12/04/2021 Lora Havens, MD     12/04/2021 10:21 AM Patient Name: Nicanor Mendolia MRN: [id number] Epilepsy Attending: Lora Havens Referring Physician/Provider: Donnetta Simpers, MD Date: 12/04/2021 Duration: 22.34 mins Patient history:  51 y.o. male with PMH significant for metastatic rectal cancer with pulm mets and multiple rounds of chemo and s/p radiation who presented to the ED with first seizure in the setting of significant electrolyte derangement from potential obstructive uropathy, urosepsis, AKI,  uremia and hyperkalemia. EEG to evaluate  for seizure Level of alertness: Awake, drowsy AEDs during EEG study: None Technical aspects: This EEG study was done with scalp electrodes positioned according to the 10-20 International system of electrode placement. Electrical activity was acquired at a sampling rate of '500Hz'  and reviewed with a high frequency filter of '70Hz'  and a low frequency filter of '1Hz' . EEG data were recorded continuously and digitally stored. Description: The posterior dominant rhythm consists of 7.5 Hz activity of moderate voltage (25-35 uV) seen predominantly in posterior head regions, symmetric and reactive to eye opening and eye closing. Drowsiness was characterized by attenuation of the posterior background rhythm.  Sharp transients were noted in left occipital region.  Hyperventilation and photic stimulation were not performed.   IMPRESSION: This study is within normal limits. No seizures or epileptiform discharges were seen throughout the recording. Priyanka O Yadav   VAS [place] LOWER EXTREMITY VENOUS (DVT)  Result Date: 12/08/2021  Lower Venous DVT Study Patient Name:  MARQUIZ SOTELO  Date of Exam:   12/08/2021 Medical Rec #: [id number]   Accession #:    [id number] Date of Birth: 1970/11/05  Patient Gender: M Patient Age:   29 years Exam Location:  [facility] Procedure:      VAS [place] LOWER EXTREMITY VENOUS (DVT) Referring Phys: Gean Birchwood --------------------------------------------------------------------------------  Indications: Pain.  Comparison Study: prior neg-12/04/21 Performing Technologist: Archie Patten RVS  Examination Guidelines: A complete evaluation includes B-mode imaging, spectral Doppler, color Doppler, and power Doppler as needed of all accessible portions of each vessel. Bilateral testing is considered an integral part of a complete examination. Limited examinations for reoccurring indications may be performed as noted. The reflux portion of the exam is performed with the patient in reverse Trendelenburg.   +---------+---------------+---------+-----------+----------+--------------+  RIGHT     Compressibility Phasicity Spontaneity Properties Thrombus Aging  +---------+---------------+---------+-----------+----------+--------------+  CFV       Full            Yes       Yes                                    +---------+---------------+---------+-----------+----------+--------------+  SFJ       Full                                                             +---------+---------------+---------+-----------+----------+--------------+  FV Prox   Full                                                             +---------+---------------+---------+-----------+----------+--------------+  FV Mid    Full                                                             +---------+---------------+---------+-----------+----------+--------------+  FV Distal Full                                                             +---------+---------------+---------+-----------+----------+--------------+  PFV       Full                                                             +---------+---------------+---------+-----------+----------+--------------+  POP       Full            Yes       Yes                                    +---------+---------------+---------+-----------+----------+--------------+  PTV       Full                                                             +---------+---------------+---------+-----------+----------+--------------+  PERO      Full                                                             +---------+---------------+---------+-----------+----------+--------------+   +----+---------------+---------+-----------+----------+--------------+  LEFT Compressibility Phasicity Spontaneity Properties Thrombus Aging  +----+---------------+---------+-----------+----------+--------------+  CFV  Full            Yes       Yes                                     +----+---------------+---------+-----------+----------+--------------+     Summary: RIGHT: - Findings appear essentially unchanged compared to previous examination. - There is no evidence of deep vein thrombosis in the lower extremity.  - No cystic structure found in the popliteal fossa.  LEFT: - No evidence of common femoral vein obstruction.  *See table(s) above for measurements and observations.    Preliminary    VAS [place] LOWER EXTREMITY VENOUS (DVT)  Result Date: 12/04/2021  Lower Venous DVT Study Patient Name:  LEONARD FEIGEL  Date of Exam:   12/04/2021 Medical Rec #: [id number]   Accession #:    [id number] Date of Birth: 26-Oct-1971  Patient Gender: M Patient Age:   30 years Exam Location:  [facility] Procedure:      VAS [place] LOWER EXTREMITY VENOUS (DVT) Referring Phys: Marzetta Board --------------------------------------------------------------------------------  Indications: Swelling, and Edema.  Comparison Study: 03/21/21 prior Performing Technologist: Archie Patten RVS  Examination Guidelines: A complete evaluation includes B-mode imaging, spectral Doppler, color Doppler, and power Doppler as needed of all accessible portions of each vessel. Bilateral testing is considered an integral part of a complete examination. Limited examinations for reoccurring indications may be performed as noted. The reflux portion of the exam is performed with the patient in reverse Trendelenburg.  +---------+---------------+---------+-----------+----------+--------------+  RIGHT     Compressibility Phasicity Spontaneity Properties Thrombus Aging  +---------+---------------+---------+-----------+----------+--------------+  CFV       Full            Yes  Yes                                    +---------+---------------+---------+-----------+----------+--------------+  SFJ       Full                                                             +---------+---------------+---------+-----------+----------+--------------+  FV  Prox   Full                                                             +---------+---------------+---------+-----------+----------+--------------+  FV Mid    Full                                                             +---------+---------------+---------+-----------+----------+--------------+  FV Distal Full                                                             +---------+---------------+---------+-----------+----------+--------------+  PFV       Full                                                             +---------+---------------+---------+-----------+----------+--------------+  POP       Full            Yes       Yes                                    +---------+---------------+---------+-----------+----------+--------------+  PTV       Full                                                             +---------+---------------+---------+-----------+----------+--------------+  PERO      Full                                                             +---------+---------------+---------+-----------+----------+--------------+   +---------+---------------+---------+-----------+----------+--------------+  LEFT      Compressibility Phasicity Spontaneity Properties Thrombus Aging  +---------+---------------+---------+-----------+----------+--------------+  CFV       Full            Yes       Yes                                    +---------+---------------+---------+-----------+----------+--------------+  SFJ       Full                                                             +---------+---------------+---------+-----------+----------+--------------+  FV Prox   Full                                                             +---------+---------------+---------+-----------+----------+--------------+  FV Mid    Full                                                             +---------+---------------+---------+-----------+----------+--------------+  FV Distal Full                                                              +---------+---------------+---------+-----------+----------+--------------+  PFV       Full                                                             +---------+---------------+---------+-----------+----------+--------------+  POP       Full            Yes       Yes                                    +---------+---------------+---------+-----------+----------+--------------+  PTV       Full                                                             +---------+---------------+---------+-----------+----------+--------------+  PERO      Full                                                             +---------+---------------+---------+-----------+----------+--------------+  Summary: BILATERAL: - No evidence of deep vein thrombosis seen in the lower extremities, bilaterally. -No evidence of popliteal cyst, bilaterally.   *See table(s) above for measurements and observations. Electronically signed by Jamelle Haring on 12/04/2021 at 3:34:12 PM.    Final       The results of significant diagnostics from this hospitalization (including imaging, microbiology, ancillary and laboratory) are listed below for reference.     Microbiology: Recent Results (from the past 240 hour(s))  Urine Culture     Status: Abnormal   Collection Time: 12/03/21  1:32 AM   Specimen: In/Out Cath Urine  Result Value Ref Range Status   Specimen Description IN/OUT CATH URINE  Final   Special Requests   Final    NONE Performed at [facility], [address] [place], [place] [address]    Culture >=100,000 COLONIES/mL SERRATIA MARCESCENS (A)  Final   Report Status 12/06/2021 FINAL  Final   Organism ID, Bacteria SERRATIA MARCESCENS (A)  Final      Susceptibility   Serratia marcescens - MIC*    CEFAZOLIN >=64 RESISTANT Resistant     CEFEPIME <=0.12 SENSITIVE Sensitive     CEFTRIAXONE <=0.25 SENSITIVE Sensitive     CIPROFLOXACIN <=0.25 SENSITIVE Sensitive     GENTAMICIN <=1 SENSITIVE Sensitive      NITROFURANTOIN 256 RESISTANT Resistant     TRIMETH/SULFA <=20 SENSITIVE Sensitive     * >=100,000 COLONIES/mL SERRATIA MARCESCENS  Culture, blood (routine x 2)     Status: Abnormal   Collection Time: 12/03/21 10:31 PM   Specimen: BLOOD  Result Value Ref Range Status   Specimen Description BLOOD RIGHT ANTECUBITAL  Final   Special Requests   Final    BOTTLES DRAWN AEROBIC AND ANAEROBIC Blood Culture adequate volume   Culture  Setup Time (A)  Final    GRAM VARIABLE ROD IN BOTH AEROBIC AND ANAEROBIC BOTTLES CRITICAL RESULT CALLED TO, READ BACK BY AND VERIFIED WITH: PHARMD E MARTIN [id number] AT 1328 BY CM Performed at [facility], [place] [address] [place], [place] [address]    Culture SERRATIA MARCESCENS (A)  Final   Report Status 12/07/2021 FINAL  Final   Organism ID, Bacteria SERRATIA MARCESCENS  Final      Susceptibility   Serratia marcescens - MIC*    CEFAZOLIN >=64 RESISTANT Resistant     CEFEPIME <=0.12 SENSITIVE Sensitive     CEFTAZIDIME <=1 SENSITIVE Sensitive     CEFTRIAXONE <=0.25 SENSITIVE Sensitive     CIPROFLOXACIN <=0.25 SENSITIVE Sensitive     GENTAMICIN <=1 SENSITIVE Sensitive     TRIMETH/SULFA <=20 SENSITIVE Sensitive     * SERRATIA MARCESCENS  Blood Culture ID Panel (Reflexed)     Status: Abnormal   Collection Time: 12/03/21 10:31 PM  Result Value Ref Range Status   Enterococcus faecalis NOT DETECTED NOT DETECTED Final   Enterococcus Faecium NOT DETECTED NOT DETECTED Final   Listeria monocytogenes NOT DETECTED NOT DETECTED Final   Staphylococcus species NOT DETECTED NOT DETECTED Final   Staphylococcus aureus (BCID) NOT DETECTED NOT DETECTED Final   Staphylococcus epidermidis NOT DETECTED NOT DETECTED Final   Staphylococcus lugdunensis NOT DETECTED NOT DETECTED Final   Streptococcus species NOT DETECTED NOT DETECTED Final   Streptococcus agalactiae NOT DETECTED NOT DETECTED Final   Streptococcus pneumoniae NOT DETECTED NOT DETECTED Final   Streptococcus  pyogenes NOT DETECTED NOT DETECTED Final   A.calcoaceticus-baumannii NOT DETECTED NOT DETECTED Final   Bacteroides fragilis NOT DETECTED NOT DETECTED  Final   Enterobacterales DETECTED (A) NOT DETECTED Final    Comment: Enterobacterales represent a large order of gram negative bacteria, not a single organism. CRITICAL RESULT CALLED TO, READ BACK BY AND VERIFIED WITH: PHARMD E MARTIN [id number] AT 1330 BY CM    Enterobacter cloacae complex NOT DETECTED NOT DETECTED Final   Escherichia coli NOT DETECTED NOT DETECTED Final   Klebsiella aerogenes NOT DETECTED NOT DETECTED Final   Klebsiella oxytoca NOT DETECTED NOT DETECTED Final   Klebsiella pneumoniae NOT DETECTED NOT DETECTED Final   Proteus species NOT DETECTED NOT DETECTED Final   Salmonella species NOT DETECTED NOT DETECTED Final   Serratia marcescens DETECTED (A) NOT DETECTED Final    Comment: CRITICAL RESULT CALLED TO, READ BACK BY AND VERIFIED WITH: PHARMD E MARTIN [id number] AT 1330 BY CM    Haemophilus influenzae NOT DETECTED NOT DETECTED Final   Neisseria meningitidis NOT DETECTED NOT DETECTED Final   Pseudomonas aeruginosa NOT DETECTED NOT DETECTED Final   Stenotrophomonas maltophilia NOT DETECTED NOT DETECTED Final   Candida albicans NOT DETECTED NOT DETECTED Final   Candida auris NOT DETECTED NOT DETECTED Final   Candida glabrata NOT DETECTED NOT DETECTED Final   Candida krusei NOT DETECTED NOT DETECTED Final   Candida parapsilosis NOT DETECTED NOT DETECTED Final   Candida tropicalis NOT DETECTED NOT DETECTED Final   Cryptococcus neoformans/gattii NOT DETECTED NOT DETECTED Final   CTX-M ESBL NOT DETECTED NOT DETECTED Final   Carbapenem resistance IMP NOT DETECTED NOT DETECTED Final   Carbapenem resistance KPC NOT DETECTED NOT DETECTED Final   Carbapenem resistance NDM NOT DETECTED NOT DETECTED Final   Carbapenem resist OXA 48 LIKE NOT DETECTED NOT DETECTED Final   Carbapenem resistance VIM NOT DETECTED NOT DETECTED Final     Comment: Performed at [facility], [address] [place], [place] [address]  Culture, blood (routine x 2)     Status: Abnormal   Collection Time: 12/03/21 10:36 PM   Specimen: BLOOD  Result Value Ref Range Status   Specimen Description BLOOD LEFT ANTECUBITAL  Final   Special Requests   Final    BOTTLES DRAWN AEROBIC AND ANAEROBIC Blood Culture adequate volume   Culture  Setup Time (A)  Final    GRAM VARIABLE ROD IN BOTH AEROBIC AND ANAEROBIC BOTTLES CRITICAL VALUE NOTED.  VALUE IS CONSISTENT WITH PREVIOUSLY REPORTED AND CALLED VALUE.    Culture (A)  Final    SERRATIA MARCESCENS SUSCEPTIBILITIES PERFORMED ON PREVIOUS CULTURE WITHIN THE LAST 5 DAYS. Performed at [facility], [place] [address], [place], [place] [address]    Report Status 12/07/2021 FINAL  Final  Resp Panel by RT-PCR (Flu A&B, Covid) Nasopharyngeal Swab     Status: None   Collection Time: 12/03/21 10:59 PM   Specimen: Nasopharyngeal Swab; Nasopharyngeal(NP) swabs in vial transport medium  Result Value Ref Range Status   SARS Coronavirus 2 by RT PCR NEGATIVE NEGATIVE Final    Comment: (NOTE) SARS-CoV-2 target nucleic acids are NOT DETECTED.  The SARS-CoV-2 RNA is generally detectable in upper respiratory specimens during the acute phase of infection. The lowest concentration of SARS-CoV-2 viral copies this assay can detect is 138 copies/mL. A negative result does not preclude SARS-Cov-2 infection and should not be used as the sole basis for treatment or other patient management decisions. A negative result may occur with  improper specimen collection/handling, submission of specimen other than nasopharyngeal swab, presence of viral mutation(s) within the areas targeted by this assay,  and inadequate number of viral copies(<138 copies/mL). A negative result must be combined with clinical observations, patient history, and epidemiological information. The expected result is Negative.  Fact Sheet for  Patients:  EntrepreneurPulse.com.au  Fact Sheet for Healthcare Providers:  IncredibleEmployment.be  This test is no t yet approved or cleared by the [place] FDA and  has been authorized for detection and/or diagnosis of SARS-CoV-2 by FDA under an Emergency Use Authorization (EUA). This EUA will remain  in effect (meaning this test can be used) for the duration of the COVID-19 declaration under Section 564(b)(1) of the Act, 21 U.S.C.section 360bbb-3(b)(1), unless the authorization is terminated  or revoked sooner.       Influenza A by PCR NEGATIVE NEGATIVE Final   Influenza B by PCR NEGATIVE NEGATIVE Final    Comment: (NOTE) The Xpert Xpress SARS-CoV-2/FLU/RSV plus assay is intended as an aid in the diagnosis of influenza from Nasopharyngeal swab specimens and should not be used as a sole basis for treatment. Nasal washings and aspirates are unacceptable for Xpert Xpress SARS-CoV-2/FLU/RSV testing.  Fact Sheet for Patients: EntrepreneurPulse.com.au  Fact Sheet for Healthcare Providers: IncredibleEmployment.be  This test is not yet approved or cleared by the [place] FDA and has been authorized for detection and/or diagnosis of SARS-CoV-2 by FDA under an Emergency Use Authorization (EUA). This EUA will remain in effect (meaning this test can be used) for the duration of the COVID-19 declaration under Section 564(b)(1) of the Act, 21 U.S.C. section 360bbb-3(b)(1), unless the authorization is terminated or revoked.  Performed at [facility], [place] [address], [place], [place] [address]      Labs: Basic Metabolic Panel: Recent Labs  Lab 12/04/21 2020 12/05/21 0146 12/06/21 0046 12/07/21 0100 12/08/21 0052  NA 136 139 139 138   136 136   135  K 6.2* 5.8* 4.5 4.5   4.4 4.5   4.5  CL 101 104 105 105   103 102   103  CO2 21* '24 25 24   ' 21* 22   23  GLUCOSE 125* 149* 107* 90   86 88    90  BUN 57* 52* 28* '17   17 13   12  ' CREATININE 7.92* 6.69* 3.62* 2.26*   2.29* 1.80*   1.75*  CALCIUM 9.2 8.8* 8.6* 8.9   8.6* 8.8*   8.7*  MG  --  1.7 1.4*  --   --   PHOS 5.0* 4.5 3.4 2.6 3.4   Liver Function Tests: Recent Labs  Lab 12/03/21 2228 12/04/21 0452 12/04/21 2020 12/05/21 0146 12/06/21 0046 12/07/21 0100 12/08/21 0052  AST 22  --   --  26 33  --   --   ALT 18  --   --  14 21  --   --   ALKPHOS 72  --   --  77 96  --   --   BILITOT 0.7  --   --  0.5 0.4  --   --   PROT 7.6  --   --  5.8* 6.0*  --   --   ALBUMIN 2.9*   < > 2.6* 2.1* 2.1* 2.2* 2.2*   < > = values in this interval not displayed.   CBC: Recent Labs  Lab 12/03/21 2228 12/03/21 2232 12/03/21 2327 12/05/21 0146 12/06/21 0046 12/07/21 0100 12/08/21 0052  WBC 12.4*  --   --  16.7* 15.1* 15.6* 13.0*  NEUTROABS 11.5*  --   --  14.4*  --   --   --   HGB 9.8*   < > 10.9* 8.8* 8.4* 9.1* 8.4*  HCT 30.6*   < > 32.0* 27.0* 26.4* 28.2* 26.7*  MCV 85.2  --   --  83.1 84.3 83.9 85.0  PLT 558*  --   --  476* 482* 518* 512*   < > = values in this interval not displayed.   CBG: Recent Labs  Lab 12/07/21 0734 12/07/21 1136 12/07/21 1552 12/07/21 2123 12/08/21 0735  GLUCAP 111* 84 134* 100* 91   Hgb A1c No results for input(s): HGBA1C in the last 72 hours. Lipid Profile No results for input(s): CHOL, HDL, LDLCALC, TRIG, CHOLHDL, LDLDIRECT in the last 72 hours. Thyroid function studies No results for input(s): TSH, T4TOTAL, T3FREE, THYROIDAB in the last 72 hours.  Invalid input(s): FREET3 Urinalysis    Component Value Date/Time   COLORURINE (A) 12/04/2021 0131    TEST NOT REPORTED DUE TO COLOR INTERFERENCE OF URINE PIGMENT   APPEARANCEUR (A) 12/04/2021 0131    TEST NOT REPORTED DUE TO COLOR INTERFERENCE OF URINE PIGMENT   LABSPEC  12/04/2021 0131    TEST NOT REPORTED DUE TO COLOR INTERFERENCE OF URINE PIGMENT   PHURINE  12/04/2021 0131    TEST NOT REPORTED DUE TO COLOR INTERFERENCE OF URINE  PIGMENT   GLUCOSEU (A) 12/04/2021 0131    TEST NOT REPORTED DUE TO COLOR INTERFERENCE OF URINE PIGMENT   HGBUR (A) 12/04/2021 0131    TEST NOT REPORTED DUE TO COLOR INTERFERENCE OF URINE PIGMENT   BILIRUBINUR (A) 12/04/2021 0131    TEST NOT REPORTED DUE TO COLOR INTERFERENCE OF URINE PIGMENT   KETONESUR (A) 12/04/2021 0131    TEST NOT REPORTED DUE TO COLOR INTERFERENCE OF URINE PIGMENT   PROTEINUR (A) 12/04/2021 0131    TEST NOT REPORTED DUE TO COLOR INTERFERENCE OF URINE PIGMENT   UROBILINOGEN 0.2 01/09/2014 0008   NITRITE (A) 12/04/2021 0131    TEST NOT REPORTED DUE TO COLOR INTERFERENCE OF URINE PIGMENT   LEUKOCYTESUR (A) 12/04/2021 0131    TEST NOT REPORTED DUE TO COLOR INTERFERENCE OF URINE PIGMENT    FURTHER DISCHARGE INSTRUCTIONS:   Get Medicines reviewed and adjusted: Please take all your medications with you for your next visit with your Primary MD   Laboratory/radiological data: Please request your Primary MD to go over all hospital tests and procedure/radiological results at the follow up, please ask your Primary MD to get all Hospital records sent to his/her office.   In some cases, they will be blood work, cultures and biopsy results pending at the time of your discharge. Please request that your primary care M.D. goes through all the records of your hospital data and follows up on these results.   Also Note the following: If you experience worsening of your admission symptoms, develop shortness of breath, life threatening emergency, suicidal or homicidal thoughts you must seek medical attention immediately by calling 911 or calling your MD immediately  if symptoms less severe.   You must read complete instructions/literature along with all the possible adverse reactions/side effects for all the Medicines you take and that have been prescribed to you. Take any new Medicines after you have completely understood and accpet all the possible adverse reactions/side effects.     Do not drive when taking Pain medications or sleeping medications (Benzodaizepines)   Do not take more than prescribed Pain, Sleep and Anxiety Medications. It is not advisable to combine anxiety,sleep  and pain medications without talking with your primary care practitioner   Special Instructions: If you have smoked or chewed Tobacco  in the last 2 yrs please stop smoking, stop any regular Alcohol  and or any Recreational drug use.   Wear Seat belts while driving.   Please note: You were cared for by a hospitalist during your hospital stay. Once you are discharged, your primary care physician will handle any further medical issues. Please note that NO REFILLS for any discharge medications will be authorized once you are discharged, as it is imperative that you return to your primary care physician (or establish a relationship with a primary care physician if you do not have one) for your post hospital discharge needs so that they can reassess your need for medications and monitor your lab values.  Time coordinating discharge: 10 minutes  SIGNED:  Marzetta Board, MD, PhD 12/08/2021, 2:17 PM

## 2021-12-08 NOTE — Progress Notes (Signed)
RN, NT, Patient family at bedside. Patient appears irritable but does not report pain. RN walked in on patient declaring intention to leave hospital facility. Patient advised against leaving hospital at this time due to current condition and complex health condition;Patient educated on AMA. Patient pleasantly requests to leave hospital; patient threatened to unto telemonitoring connections + IV connection if not allowed to leave. MD aware + Charge RN notified. Will continue to monitor

## 2021-12-08 NOTE — Plan of Care (Signed)

## 2021-12-08 NOTE — TOC Transition Note (Signed)
Transition of Care [facility]) - CM/SW Discharge Note   Patient Details  Name: Bradley Colon MRN: [id number] Date of Birth: July 17, 1971  [facility] CM/SW Contact:  Carles Collet, RN Phone Number: 12/08/2021, 11:44 AM   Clinical Narrative:    Met with patient at bedside. Discussed with him that insurance authorization for home IV Abx will not be approved until tomorrow and therefore he would need to stay in the hospital for a dose on Monday morning before leaving in order have full course of antibiotic therapy that would conclude with one dose Tuesday at home.  He was fixated on being uncomfortable in his bed, I voiced understanding and suggested that we could find a solution for his comfort for one night; but he was not willing to consider any solution other than leaving.  I clearly explained to him that leaving today would mean that he would not be able to complete his antibiotic course. He said this was unfair, I again explained that he has the option to stay, but he stated he wanted to leave.      Barriers to Discharge: Continued Medical Work up   Patient Goals and CMS Choice        Discharge Placement                       Discharge Plan and Services   Discharge Planning Services: CM Consult                                 Social Determinants of Health (SDOH) Interventions     Readmission Risk Interventions No flowsheet data found.

## 2021-12-10 ENCOUNTER — Encounter: Payer: Self-pay | Admitting: *Deleted

## 2021-12-10 ENCOUNTER — Inpatient Hospital Stay: Payer: Medicaid Other

## 2021-12-10 ENCOUNTER — Encounter: Payer: Self-pay | Admitting: Internal Medicine

## 2021-12-10 ENCOUNTER — Telehealth: Payer: Self-pay | Admitting: Internal Medicine

## 2021-12-10 MED ORDER — LEVOFLOXACIN 500 MG PO TABS: 500.0000 mg | ORAL_TABLET | Freq: Every day | ORAL | 0 refills | Status: AC

## 2021-12-10 NOTE — Telephone Encounter (Signed)
Patient and his spouse have called requesting IV antibiotics that were supposed to be prescribed when he left the hospital. He left the hospital AMA and this was not setup. He is now s/p 3 days without abx and incomplete treatment for resistant UTI. He came in septic with seizure activity/encephalopathy thought to be related to infection.  I have reached out to ID for guidance and to determine if there is an oral regimen that may cover his infection. I anticipate ongoing UTI in the setting of retension and frequent need for IO self catheterization.

## 2021-12-10 NOTE — Telephone Encounter (Signed)
Spoke with ID. Will do Levaquin 500mg  for 7 days. Script sent to pharmacy on record.

## 2021-12-11 ENCOUNTER — Other Ambulatory Visit: Payer: Self-pay | Admitting: Internal Medicine

## 2021-12-12 ENCOUNTER — Inpatient Hospital Stay: Payer: Medicaid Other

## 2021-12-12 ENCOUNTER — Other Ambulatory Visit: Payer: Self-pay | Admitting: Internal Medicine

## 2021-12-12 ENCOUNTER — Other Ambulatory Visit (HOSPITAL_COMMUNITY): Payer: Self-pay

## 2021-12-12 ENCOUNTER — Telehealth: Payer: Self-pay | Admitting: *Deleted

## 2021-12-12 MED ORDER — OXYCODONE HCL 20 MG PO TABS
20.0000 mg | ORAL_TABLET | Freq: Three times a day (TID) | ORAL | 0 refills | Status: DC | PRN
  Filled 2021-12-12: qty 90, 30d supply, fill #0

## 2021-12-12 MED ORDER — MORPHINE SULFATE ER 60 MG PO TBCR
60.0000 mg | EXTENDED_RELEASE_TABLET | Freq: Two times a day (BID) | ORAL | 0 refills | Status: DC
  Filled 2021-12-12: qty 60, 30d supply, fill #0

## 2021-12-12 NOTE — Telephone Encounter (Signed)
Received fax from [facility] that patient has appointment there on 12/20/21. In process of sending requested records.

## 2021-12-12 NOTE — Telephone Encounter (Signed)
Called and left VM for patient to call office to have labs and see Dr. Benay Spice next week. Noted our concern for him and that we want only the best for him.

## 2021-12-12 NOTE — Telephone Encounter (Signed)
Refills completed for opioid pain medications.

## 2021-12-13 ENCOUNTER — Other Ambulatory Visit (HOSPITAL_COMMUNITY): Payer: Self-pay

## 2021-12-23 ENCOUNTER — Encounter: Payer: Self-pay | Admitting: *Deleted

## 2021-12-23 NOTE — Progress Notes (Signed)
Bradley Sack, RN nurse navigator at [facility] to inquire when [name] new patient appointment has been scheduled. She reports he was scheduled for today at 1100, but sent Mychart message and cancelled. She will attempt to reach out to Mr. Borum to see if he wishes to reschedule.

## 2021-12-24 ENCOUNTER — Other Ambulatory Visit: Payer: Self-pay

## 2021-12-24 ENCOUNTER — Ambulatory Visit: Payer: Self-pay

## 2021-12-24 ENCOUNTER — Ambulatory Visit: Payer: Self-pay | Admitting: Nurse Practitioner

## 2021-12-25 NOTE — Progress Notes (Signed)
Call from [place], Wells Guiles that she has attempted to reach patient and left messages with no return call.

## 2021-12-26 ENCOUNTER — Telehealth: Payer: Self-pay | Admitting: *Deleted

## 2021-12-26 ENCOUNTER — Other Ambulatory Visit (HOSPITAL_COMMUNITY): Payer: Self-pay

## 2021-12-26 ENCOUNTER — Other Ambulatory Visit: Payer: Self-pay | Admitting: Oncology

## 2021-12-26 MED ORDER — ONDANSETRON HCL 8 MG PO TABS
8.0000 mg | ORAL_TABLET | Freq: Three times a day (TID) | ORAL | 0 refills | Status: DC | PRN
  Filled 2021-12-26: qty 30, 10d supply, fill #0

## 2021-12-26 NOTE — Telephone Encounter (Signed)
Received refill request for ondansetron from pharmacy. Called patient in inquire re: where he plans to receive his oncology care. He has no plans to return to [facility]. Agrees to see Dr. Benay Spice again, but expressed he is displeased with his last visit with Dr. Benay Spice. He has not followed up yet with his PCP regarding his renal issues. Reports he is also disappointed in the care at his PCP office on [address]. He agrees to return to Dr. Benay Spice for an office visit. Scheduling message sent to make appointment. Informed him that ondansetron will be refilled x 1, but needs to keep appointment with Dr. Benay Spice.

## 2021-12-27 ENCOUNTER — Emergency Department (HOSPITAL_COMMUNITY): Payer: Medicaid Other

## 2021-12-27 ENCOUNTER — Ambulatory Visit: Payer: Medicaid Other

## 2021-12-27 ENCOUNTER — Inpatient Hospital Stay (HOSPITAL_COMMUNITY)
Admission: EM | Admit: 2021-12-27 | Discharge: 2022-01-01 | DRG: 682 | Disposition: A | Discharge status: Home / Self Care | Payer: Medicaid Other | Attending: Internal Medicine | Admitting: Internal Medicine

## 2021-12-27 DIAGNOSIS — L89312 Pressure ulcer of right buttock, stage 2: Secondary | ICD-10-CM | POA: Diagnosis present

## 2021-12-27 DIAGNOSIS — G894 Chronic pain syndrome: Secondary | ICD-10-CM | POA: Diagnosis present

## 2021-12-27 DIAGNOSIS — L8990 Pressure ulcer of unspecified site, unspecified stage: Secondary | ICD-10-CM | POA: Insufficient documentation

## 2021-12-27 DIAGNOSIS — Z923 Personal history of irradiation: Secondary | ICD-10-CM

## 2021-12-27 DIAGNOSIS — Z87442 Personal history of urinary calculi: Secondary | ICD-10-CM

## 2021-12-27 DIAGNOSIS — E871 Hypo-osmolality and hyponatremia: Secondary | ICD-10-CM | POA: Diagnosis present

## 2021-12-27 DIAGNOSIS — Z20822 Contact with and (suspected) exposure to covid-19: Secondary | ICD-10-CM | POA: Diagnosis present

## 2021-12-27 DIAGNOSIS — T426X5A Adverse effect of other antiepileptic and sedative-hypnotic drugs, initial encounter: Secondary | ICD-10-CM | POA: Diagnosis present

## 2021-12-27 DIAGNOSIS — C20 Malignant neoplasm of rectum: Secondary | ICD-10-CM | POA: Diagnosis present

## 2021-12-27 DIAGNOSIS — G9341 Metabolic encephalopathy: Secondary | ICD-10-CM | POA: Diagnosis present

## 2021-12-27 DIAGNOSIS — T451X5A Adverse effect of antineoplastic and immunosuppressive drugs, initial encounter: Secondary | ICD-10-CM | POA: Diagnosis present

## 2021-12-27 DIAGNOSIS — C787 Secondary malignant neoplasm of liver and intrahepatic bile duct: Secondary | ICD-10-CM | POA: Diagnosis present

## 2021-12-27 DIAGNOSIS — Z881 Allergy status to other antibiotic agents status: Secondary | ICD-10-CM

## 2021-12-27 DIAGNOSIS — C7800 Secondary malignant neoplasm of unspecified lung: Secondary | ICD-10-CM | POA: Diagnosis present

## 2021-12-27 DIAGNOSIS — E875 Hyperkalemia: Secondary | ICD-10-CM | POA: Diagnosis present

## 2021-12-27 DIAGNOSIS — R338 Other retention of urine: Secondary | ICD-10-CM | POA: Diagnosis present

## 2021-12-27 DIAGNOSIS — Z452 Encounter for adjustment and management of vascular access device: Secondary | ICD-10-CM

## 2021-12-27 DIAGNOSIS — N1339 Other hydronephrosis: Secondary | ICD-10-CM | POA: Diagnosis present

## 2021-12-27 DIAGNOSIS — Z91013 Allergy to seafood: Secondary | ICD-10-CM

## 2021-12-27 DIAGNOSIS — Z91030 Bee allergy status: Secondary | ICD-10-CM

## 2021-12-27 DIAGNOSIS — Z833 Family history of diabetes mellitus: Secondary | ICD-10-CM

## 2021-12-27 DIAGNOSIS — Z91041 Radiographic dye allergy status: Secondary | ICD-10-CM

## 2021-12-27 DIAGNOSIS — I10 Essential (primary) hypertension: Secondary | ICD-10-CM | POA: Diagnosis present

## 2021-12-27 DIAGNOSIS — N179 Acute kidney failure, unspecified: Principal | ICD-10-CM | POA: Diagnosis present

## 2021-12-27 DIAGNOSIS — R569 Unspecified convulsions: Secondary | ICD-10-CM

## 2021-12-27 DIAGNOSIS — G253 Myoclonus: Secondary | ICD-10-CM | POA: Diagnosis present

## 2021-12-27 DIAGNOSIS — D6481 Anemia due to antineoplastic chemotherapy: Secondary | ICD-10-CM | POA: Diagnosis present

## 2021-12-27 DIAGNOSIS — Z79899 Other long term (current) drug therapy: Secondary | ICD-10-CM | POA: Diagnosis not present

## 2021-12-27 DIAGNOSIS — Z801 Family history of malignant neoplasm of trachea, bronchus and lung: Secondary | ICD-10-CM

## 2021-12-27 DIAGNOSIS — D630 Anemia in neoplastic disease: Secondary | ICD-10-CM | POA: Diagnosis present

## 2021-12-27 DIAGNOSIS — C7801 Secondary malignant neoplasm of right lung: Secondary | ICD-10-CM | POA: Diagnosis present

## 2021-12-27 DIAGNOSIS — Z803 Family history of malignant neoplasm of breast: Secondary | ICD-10-CM

## 2021-12-27 DIAGNOSIS — Z8249 Family history of ischemic heart disease and other diseases of the circulatory system: Secondary | ICD-10-CM

## 2021-12-27 DIAGNOSIS — C7802 Secondary malignant neoplasm of left lung: Secondary | ICD-10-CM | POA: Diagnosis present

## 2021-12-27 DIAGNOSIS — Z91040 Latex allergy status: Secondary | ICD-10-CM

## 2021-12-27 DIAGNOSIS — N170 Acute kidney failure with tubular necrosis: Secondary | ICD-10-CM | POA: Diagnosis not present

## 2021-12-27 DIAGNOSIS — Z87891 Personal history of nicotine dependence: Secondary | ICD-10-CM

## 2021-12-27 LAB — CBC WITH DIFFERENTIAL/PLATELET
Abs Immature Granulocytes: 0.03 10*3/uL (ref 0.00–0.07)
Basophils Absolute: 0.1 10*3/uL (ref 0.0–0.1)
Basophils Relative: 1 %
Eosinophils Absolute: 0.2 10*3/uL (ref 0.0–0.5)
Eosinophils Relative: 2 %
HCT: 31.2 % — ABNORMAL LOW (ref 39.0–52.0)
Hemoglobin: 10.2 g/dL — ABNORMAL LOW (ref 13.0–17.0)
Immature Granulocytes: 0 %
Lymphocytes Relative: 16 %
Lymphs Abs: 1.5 10*3/uL (ref 0.7–4.0)
MCH: 27.2 pg (ref 26.0–34.0)
MCHC: 32.7 g/dL (ref 30.0–36.0)
MCV: 83.2 fL (ref 80.0–100.0)
Monocytes Absolute: 1 10*3/uL (ref 0.1–1.0)
Monocytes Relative: 11 %
Neutro Abs: 6.4 10*3/uL (ref 1.7–7.7)
Neutrophils Relative %: 70 %
Platelets: 439 10*3/uL — ABNORMAL HIGH (ref 150–400)
RBC: 3.75 MIL/uL — ABNORMAL LOW (ref 4.22–5.81)
RDW: 15 % (ref 11.5–15.5)
WBC: 9.1 10*3/uL (ref 4.0–10.5)
nRBC: 0 % (ref 0.0–0.2)

## 2021-12-27 LAB — COMPREHENSIVE METABOLIC PANEL
ALT: 10 U/L (ref 0–44)
AST: 17 U/L (ref 15–41)
Albumin: 3.2 g/dL — ABNORMAL LOW (ref 3.5–5.0)
Alkaline Phosphatase: 82 U/L (ref 38–126)
Anion gap: 17 — ABNORMAL HIGH (ref 5–15)
BUN: 90 mg/dL — ABNORMAL HIGH (ref 6–20)
CO2: 17 mmol/L — ABNORMAL LOW (ref 22–32)
Calcium: 10.1 mg/dL (ref 8.9–10.3)
Chloride: 98 mmol/L (ref 98–111)
Creatinine, Ser: 14.4 mg/dL — ABNORMAL HIGH (ref 0.61–1.24)
GFR, Estimated: 4 mL/min — ABNORMAL LOW (ref >60)
Glucose, Bld: 94 mg/dL (ref 70–99)
Potassium: >7.5 mmol/L (ref 3.5–5.1)
Sodium: 132 mmol/L — ABNORMAL LOW (ref 135–145)
Total Bilirubin: 0.5 mg/dL (ref 0.3–1.2)
Total Protein: 8 g/dL (ref 6.5–8.1)

## 2021-12-27 LAB — RESP PANEL BY RT-PCR (FLU A&B, COVID) ARPGX2
Influenza A by PCR: NEGATIVE
Influenza B by PCR: NEGATIVE
SARS Coronavirus 2 by RT PCR: NEGATIVE

## 2021-12-27 LAB — URINALYSIS, ROUTINE W REFLEX MICROSCOPIC
Bilirubin Urine: NEGATIVE
Glucose, UA: 50 mg/dL — AB
Ketones, ur: NEGATIVE mg/dL
Nitrite: NEGATIVE
Protein, ur: NEGATIVE mg/dL
Specific Gravity, Urine: 1.006 (ref 1.005–1.030)
pH: 7 (ref 5.0–8.0)

## 2021-12-27 LAB — LACTIC ACID, PLASMA
Lactic Acid, Venous: 1.5 mmol/L (ref 0.5–1.9)
Lactic Acid, Venous: 1.3 mmol/L (ref 0.5–1.9)

## 2021-12-27 LAB — SODIUM, URINE, RANDOM: Sodium, Ur: 90 mmol/L

## 2021-12-27 LAB — CREATININE, URINE, RANDOM: Creatinine, Urine: 34.23 mg/dL

## 2021-12-27 MED ORDER — SENNOSIDES-DOCUSATE SODIUM 8.6-50 MG PO TABS
1.0000 | ORAL_TABLET | Freq: Every evening | ORAL | Status: DC | PRN
  Administered 2021-12-29 – 2021-12-31 (×2): 1 via ORAL
  Filled 2021-12-27 (×2): qty 1

## 2021-12-27 MED ORDER — LORAZEPAM 2 MG/ML IJ SOLN
1.0000 mg | INTRAMUSCULAR | Status: DC | PRN
Start: 2021-12-27 — End: 2022-01-01
  Administered 2021-12-28: 1 mg via INTRAVENOUS
  Filled 2021-12-27 (×2): qty 1

## 2021-12-27 MED ORDER — LEVETIRACETAM IN NACL 500 MG/100ML IV SOLN
500.0000 mg | Freq: Two times a day (BID) | INTRAVENOUS | Status: DC
  Administered 2021-12-27 – 2022-01-01 (×9): 500 mg via INTRAVENOUS
  Filled 2021-12-27 (×11): qty 100

## 2021-12-27 MED ORDER — ONDANSETRON HCL 4 MG PO TABS: 4.0000 mg | ORAL_TABLET | Freq: Four times a day (QID) | ORAL | Status: DC | PRN

## 2021-12-27 MED ORDER — ENSURE ENLIVE PO LIQD
237.0000 mL | Freq: Two times a day (BID) | ORAL | Status: DC
  Filled 2021-12-27: qty 237

## 2021-12-27 MED ORDER — ONDANSETRON HCL 4 MG/2ML IJ SOLN
4.0000 mg | Freq: Four times a day (QID) | INTRAMUSCULAR | Status: DC | PRN
  Administered 2021-12-28: 4 mg via INTRAVENOUS
  Filled 2021-12-27: qty 2

## 2021-12-27 MED ORDER — ACETAMINOPHEN 650 MG RE SUPP: 650.0000 mg | Freq: Four times a day (QID) | RECTAL | Status: DC | PRN

## 2021-12-27 MED ORDER — SODIUM CHLORIDE 0.9% FLUSH
3.0000 mL | Freq: Two times a day (BID) | INTRAVENOUS | Status: DC
  Administered 2021-12-27 – 2021-12-31 (×5): 3 mL via INTRAVENOUS

## 2021-12-27 MED ORDER — ALBUTEROL SULFATE (2.5 MG/3ML) 0.083% IN NEBU: 2.5000 mg | INHALATION_SOLUTION | RESPIRATORY_TRACT | Status: DC | PRN

## 2021-12-27 MED ORDER — CALCIUM GLUCONATE-NACL 1-0.675 GM/50ML-% IV SOLN
1.0000 g | Freq: Once | INTRAVENOUS | Status: AC
  Administered 2021-12-27: 1000 mg via INTRAVENOUS
  Filled 2021-12-27: qty 50

## 2021-12-27 MED ORDER — DEXTROSE 50 % IV SOLN
1.0000 | Freq: Once | INTRAVENOUS | Status: AC
  Administered 2021-12-27: 50 mL via INTRAVENOUS
  Filled 2021-12-27: qty 50

## 2021-12-27 MED ORDER — SODIUM CHLORIDE 0.9% FLUSH: 3.0000 mL | INTRAVENOUS | Status: DC | PRN

## 2021-12-27 MED ORDER — SODIUM CHLORIDE 0.9 % IV SOLN: 250.0000 mL | INTRAVENOUS | Status: DC | PRN

## 2021-12-27 MED ORDER — ALBUTEROL SULFATE HFA 108 (90 BASE) MCG/ACT IN AERS: 2.0000 | INHALATION_SPRAY | RESPIRATORY_TRACT | Status: DC | PRN

## 2021-12-27 MED ORDER — METOPROLOL TARTRATE 5 MG/5ML IV SOLN
5.0000 mg | Freq: Once | INTRAVENOUS | Status: AC
Start: 2021-12-27 — End: 2021-12-27
  Administered 2021-12-27: 5 mg via INTRAVENOUS
  Filled 2021-12-27: qty 5

## 2021-12-27 MED ORDER — ACETAMINOPHEN 325 MG PO TABS
650.0000 mg | ORAL_TABLET | Freq: Four times a day (QID) | ORAL | Status: DC | PRN
  Administered 2021-12-29 – 2021-12-31 (×4): 650 mg via ORAL
  Filled 2021-12-27 (×4): qty 2

## 2021-12-27 MED ORDER — FUROSEMIDE 10 MG/ML IJ SOLN
40.0000 mg | Freq: Once | INTRAMUSCULAR | Status: AC
Start: 2021-12-27 — End: 2021-12-27
  Administered 2021-12-27: 40 mg via INTRAVENOUS
  Filled 2021-12-27: qty 4

## 2021-12-27 MED ORDER — SODIUM BICARBONATE 8.4 % IV SOLN
100.0000 meq | Freq: Once | INTRAVENOUS | Status: AC
  Administered 2021-12-27: 100 meq via INTRAVENOUS
  Filled 2021-12-27: qty 50

## 2021-12-27 MED ORDER — HEPARIN SODIUM (PORCINE) 5000 UNIT/ML IJ SOLN
5000.0000 [IU] | Freq: Three times a day (TID) | INTRAMUSCULAR | Status: DC
  Administered 2021-12-28 – 2022-01-01 (×11): 5000 [IU] via SUBCUTANEOUS
  Filled 2021-12-27 (×12): qty 1

## 2021-12-27 MED ORDER — SODIUM ZIRCONIUM CYCLOSILICATE 10 G PO PACK
10.0000 g | PACK | Freq: Every day | ORAL | Status: DC
  Administered 2021-12-27 – 2021-12-28 (×2): 10 g via ORAL
  Filled 2021-12-27 (×3): qty 1

## 2021-12-27 MED ORDER — INSULIN ASPART 100 UNIT/ML IV SOLN
10.0000 [IU] | Freq: Once | INTRAVENOUS | Status: AC
  Administered 2021-12-27: 10 [IU] via INTRAVENOUS

## 2021-12-27 NOTE — ED Provider Triage Note (Signed)
Emergency Medicine Provider Triage Evaluation Note  Bradley Colon , a 51 y.o. male  was evaluated in triage.  Pt complains of dizziness and altered mental status.  Patient has had intermittent episodes of confusion since leaving the hospital AMA on 2/5.  Patient was being treated for sepsis secondary to UTI/pyelonephritis.  Patient reports that he has been having dizziness over the last few days.  Patient reports that after playing for a while and then getting up he has numbness in his legs however this gradually resolves.  Patient's family member at bedside reports that patient has been staring off to space often however no seizure-like activity.  Patient is not on any medications for seizures.  Patient is unable to urinate on his own and self catheterizes.  Has not had any pain with catheterization or blood in urine.  Patient denies any fever, chills, facial asymmetry, dysarthria, blurry vision, diplopia, chest pain, shortness of breath.   Review of Systems  Positive: See above Negative: See above  Physical Exam  BP (!) 202/105 (BP Location: Left Arm)    Pulse 84    Temp 99.1 F (37.3 C) (Oral)    Resp 16    SpO2 96%  Gen:   Awake, no distress   Resp:  Normal effort  MSK:   Moves extremities without difficulty  Other:  CN II through XII intact.  Patient moves all limbs equally without difficulty.  Abdomen soft, nondistended, nontender no guarding or rebound tenderness.  Medical Decision Making  Medically screening exam initiated at 6:28 PM.  Appropriate orders placed.  Bradley Colon was informed that the remainder of the evaluation will be completed by another provider, this initial triage assessment does not replace that evaluation, and the importance of remaining in the ED until their evaluation is complete.     Loni Beckwith, [place] 12/27/21 1831

## 2021-12-27 NOTE — Assessment & Plan Note (Addendum)
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  -Chemotherapy currently on hold since November, 2022 due to multiple complications, hospitalization at [facility] in 10/2021
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
## 2021-12-27 NOTE — Assessment & Plan Note (Addendum)
-  Patient had MRI on 12/05/2021, showed no cerebral metastatic disease or acute intracranial abnormality  -EEG on 12/04/2021 was within normal limits.  No seizures or epileptiform discharges were seen -Continue Keppra.  -Myoclonic jerking also improved after HD.

## 2021-12-27 NOTE — Assessment & Plan Note (Addendum)
-   Potassium 7.2, creatinine 14.0.  Potassium was 7.5 at the time of admission, was treated with [facility]. -However potassium remains persistently elevated, nontunneled central venous catheter was placed by CCM on 2/25 -Patient received emergent HD, potassium improved to 4.8

## 2021-12-27 NOTE — ED Provider Notes (Signed)
[place] EMERGENCY DEPARTMENT Provider Note   CSN: [id number] Arrival date & time: 12/27/21  1732     History  Chief Complaint  Patient presents with   Altered Mental Status    Bradley Colon is a 51 y.o. male with a past medical history of stage 3 rectal cancer with lung and liver metastases diagnosed in 2020, urinary retention requiring self-catheterization and seizures presenting today with 4 days worth of confusion.  Family member says that he has been intermittently confused however is worsened and he has begun to have full body twitching and occasional falls due to leg discomfort.  He does not have a primary care provider or nephrologist however follows with his gastroenterologist who treated his rectal cancer.  Of note, patient presented to the emergency department on 2/1 after having a seizure.  At that time he was found to be septic secondary to pyelonephritis in the setting of urinary obstruction.  He left the hospital 4 days later [facility].  He never got established with a primary care provider.  Home Medications Prior to Admission medications   Medication Sig Start Date End Date Taking? Authorizing Provider  albuterol (VENTOLIN HFA) 108 (90 Base) MCG/ACT inhaler INHALE 2 PUFFS INTO THE LUNGS EVERY 6 (SIX) HOURS AS NEEDED FOR WHEEZING OR SHORTNESS OF BREATH. 11/13/20 12/04/21  Ladell Pier, MD  baclofen (LIORESAL) 10 MG tablet Take 1 tablet by mouth 3 times daily as needed for rectal pain and spasm. Patient not taking: Reported on 07/22/2021 07/11/21   Acquanetta Chain, DO  diazepam (VALIUM) 10 MG tablet Take 1 tablet by mouth every 12 hours as needed for anxiety. Patient not taking: Reported on 12/03/2021 07/11/21   Acquanetta Chain, DO  diphenoxylate-atropine (LOMOTIL) 2.5-0.025 MG tablet Take 2 tablets by mouth four times daily as needed. 08/30/21     gabapentin (NEURONTIN) 600 MG tablet Take 1 tablet (600 mg total) by mouth 2 (two) times daily. 07/11/21    Acquanetta Chain, DO  Hyoscyamine Sulfate SL (LEVSIN/SL) 0.125 MG SUBL Place 1 tablet under the tongue every 8 (eight) hours as needed (as needed for stomach cramps). 09/30/21   Ladell Pier, MD  morphine (MS CONTIN) 60 MG 12 hr tablet Take 1 tablet by mouth every 12 hours. 12/12/21   Acquanetta Chain, DO  Nitroglycerin (RECTIV) 0.4 % OINT Place 1 application rectally daily as needed (fissure pain).    [provider]  Nutritional Supplements (ENSURE PLANT-BASED PROTEIN) LIQD Take 1 Can by mouth 2 (two) times daily. 11/24/19   [provider]  ondansetron (ZOFRAN) 8 MG tablet Take 1 tablet (8 mg total) by mouth every 8 (eight) hours as needed for nausea or vomiting. 12/26/21   Ladell Pier, MD  Oxycodone HCl 20 MG TABS Take 1 tablet by mouth 3 times daily as needed for severe pain. 12/12/21   Acquanetta Chain, DO  pantoprazole (PROTONIX) 40 MG tablet Take 1 tablet (40 mg total) by mouth daily. Patient taking differently: Take 40 mg by mouth daily as needed (acid reflux). 08/22/21   Ladell Pier, MD  phenazopyridine (PYRIDIUM) 100 MG tablet Take 1 tablet (100 mg total) by mouth 3 (three) times daily as needed for pain (Pain with urination). 06/03/21   Acquanetta Chain, DO  tamsulosin (FLOMAX) 0.4 MG CAPS capsule Take 2 capsules (0.8 mg total) by mouth daily. 07/11/21   Acquanetta Chain, DO      Allergies    Bee  venom, Other, Contrast media [iodinated contrast media], Shrimp [shellfish allergy], Vancomycin, Wheat bran, and Latex    Review of Systems   Review of Systems  Constitutional:  Negative for chills and fever.  Respiratory:  Negative for chest tightness and shortness of breath.   Cardiovascular:  Negative for chest pain.  Gastrointestinal:  Negative for diarrhea, nausea and vomiting.  Neurological:  Positive for tremors and weakness.  Psychiatric/Behavioral:  Positive for confusion.   See HPI for more  Physical Exam Updated Vital Signs BP (!)  202/105 (BP Location: Left Arm)    Pulse 84    Temp 99.1 F (37.3 C) (Oral)    Resp 16    SpO2 96%  Physical Exam Vitals and nursing note reviewed.  Constitutional:      Appearance: Normal appearance. He is ill-appearing.     Comments: Chronically ill-appearing, jaundiced  HENT:     Head: Normocephalic and atraumatic.  Eyes:     General: No scleral icterus.    Conjunctiva/sclera: Conjunctivae normal.  Cardiovascular:     Rate and Rhythm: Normal rate and regular rhythm.  Pulmonary:     Effort: Pulmonary effort is normal. No respiratory distress.  Skin:    Findings: No rash.  Neurological:     Mental Status: He is alert.  Psychiatric:        Mood and Affect: Mood normal.    ED Results / Procedures / Treatments   Labs (all labs ordered are listed, but only abnormal results are displayed) Labs Reviewed  COMPREHENSIVE METABOLIC PANEL - Abnormal; Notable for the following components:      Result Value   Sodium 132 (*)    Potassium >7.5 (*)    CO2 17 (*)    BUN 90 (*)    Creatinine, Ser 14.40 (*)    Albumin 3.2 (*)    GFR, Estimated 4 (*)    Anion gap 17 (*)    All other components within normal limits  CBC WITH DIFFERENTIAL/PLATELET - Abnormal; Notable for the following components:   RBC 3.75 (*)    Hemoglobin 10.2 (*)    HCT 31.2 (*)    Platelets 439 (*)    All other components within normal limits  CULTURE, BLOOD (ROUTINE X 2)  CULTURE, BLOOD (ROUTINE X 2)  LACTIC ACID, PLASMA  URINALYSIS, ROUTINE W REFLEX MICROSCOPIC  LACTIC ACID, PLASMA    EKG EKG Interpretation  Date/Time:  Friday December 27 2021 20:33:40 EST Ventricular Rate:  71 PR Interval:  186 QRS Duration: 104 QT Interval:  376 QTC Calculation: 408 R Axis:   -54 Text Interpretation: Normal sinus rhythm Pulmonary disease pattern Left anterior fascicular block Abnormal ECG When compared with ECG of 27-Dec-2021 17:31, PREVIOUS ECG IS PRESENT Confirmed by Lacretia Leigh ([id number]) on 12/27/2021 8:52:41  PM  Radiology CT HEAD WO CONTRAST (5MM)  Result Date: 12/27/2021 CLINICAL DATA:  Mental status change EXAM: CT HEAD WITHOUT CONTRAST TECHNIQUE: Contiguous axial images were obtained from the base of the skull through the vertex without intravenous contrast. RADIATION DOSE REDUCTION: This exam was performed according to the departmental dose-optimization program which includes automated exposure control, adjustment of the mA and/or kV according to patient size and/or use of iterative reconstruction technique. COMPARISON:  MRI brain 12/05/2021, CT brain 08/17/2010 FINDINGS: Brain: No acute territorial infarction, hemorrhage or intracranial mass. The ventricles are nonenlarged. Vascular: No hyperdense vessels.  No unexpected calcification. Skull: Normal. Negative for fracture or focal lesion. Sinuses/Orbits: Small fluid level left maxillary sinus Other:  None IMPRESSION: 1. No CT evidence for acute intracranial abnormality. 2. Left maxillary sinusitis Electronically Signed   By: Donavan Foil M.D.   On: 12/27/2021 19:50    Procedures .Critical Care Performed by: Rhae Hammock, PA-C Authorized by: Rhae Hammock, PA-C   Critical care provider statement:    Critical care time (minutes):  30   Critical care time was exclusive of:  Separately billable procedures and treating other patients   Critical care was necessary to treat or prevent imminent or life-threatening deterioration of the following conditions:  Renal failure   Critical care was time spent personally by me on the following activities:  Development of treatment plan with patient or surrogate, discussions with consultants, evaluation of patient's response to treatment, examination of patient, ordering and review of laboratory studies, ordering and performing treatments and interventions, pulse oximetry, re-evaluation of patient's condition and review of old charts   I assumed direction of critical care for this patient from another  provider in my specialty: no     Care discussed with: admitting provider     Medications Ordered in ED Medications  calcium gluconate 1 g/ 50 mL sodium chloride IVPB (has no administration in time range)  insulin aspart (novoLOG) injection 10 Units (has no administration in time range)    And  dextrose 50 % solution 50 mL (has no administration in time range)  furosemide (LASIX) injection 40 mg (has no administration in time range)    ED Course/ Medical Decision Making/ A&P                           Medical Decision Making Amount and/or Complexity of Data Reviewed Radiology: ordered.  Risk OTC drugs. Prescription drug management.   Patient presents to the ED for concern of altered mental status.  Differential includes but is not limited to alcohol or drug intoxication, CVA, DKA, postictal state, encephalitis and electrolyte abnormality.  Co morbidities that complicate the patient evaluation include: Known metastatic cancer and urinary retention.  Additional history obtained from internal and external records:    Per chart review, on February 1, patient presented to the emergency department after a seizure.  At that time his potassium was found to be 7.5 and creatinine 14.9.  He was admitted to the hospital for management and CT abdomen revealed perinephric and periureteral fat stranding.  His kidney function was thought to be secondary to obstruction due to patient's bladder distention.  Per external chart review, patient has been seen by [facility] for urinary retention and instructed to self catheterize.  Family member says he does not consistently do this.  Today's presentation seems very similar.   Workup:  Lab Tests:  I Ordered and viewed labs.   The pertinent results include: K>7.5, Cr 14.40, GFR <4  2. Imaging Studies ordered:  CT head was ordered in triage.  It was reviewed by me and I did not note any acute findings responsible for confusion.  Radiologist read it as  negative.  Renal ultrasound ordered per nephrology request  3. Cardiac Monitoring:  The patient was maintained on a cardiac monitor.  I personally viewed and interpreted the cardiac monitored which showed normal sinus rhythm, no concerning findings   5. Critical Interventions: Initiation of hyperkalemia orders for patient's critical level  6. Consultations Obtained:  I spoke with Dr. Marval Regal who agreed to come see the patient.  He also recommended a renal ultrasound and placement of a Foley  catheter due to the previous obstruction.  Treatment:  Medications:  I ordered calcium gluconate, insulin and Lasix to treat patient's hyperkalemia.  Dispo:  Problem List / ED Course:  Hyperkalemia secondary to renal failure   Dispostion:  After the interventions noted above I called for patient to be admitted to the hospital.  Dr. Tonie Griffith with [facility] is accepting physician.   Final Clinical Impression(s) / ED Diagnoses Final diagnoses:  Hyperkalemia  Acute renal failure, unspecified acute renal failure type ([place])    Rx / DC Orders Admit to hospitalist team with Dr. Marval Regal following.   Darliss Ridgel 12/27/21 2202    Lacretia Leigh, MD 12/30/21 1011

## 2021-12-27 NOTE — ED Provider Notes (Signed)
I provided a substantive portion of the care of this patient.  I personally performed the entirety of the medical decision making for this encounter.  EKG Interpretation  Date/Time:  Friday December 27 2021 20:33:40 EST Ventricular Rate:  71 PR Interval:  186 QRS Duration: 104 QT Interval:  376 QTC Calculation: 408 R Axis:   -54 Text Interpretation: Normal sinus rhythm Pulmonary disease pattern Left anterior fascicular block Abnormal ECG When compared with ECG of 27-Dec-2021 17:31, PREVIOUS ECG IS PRESENT Confirmed by Lacretia Leigh ([id number]) on 12/27/2021 8:52:41 PM   Patient presents with dizziness and altered middle status.  Labs significant for renal failure with K of greater than 7.5 and creatinine over 10.  EKG per my interpretation shows no acute hyperkalemic changes.  Patient will be treated with calcium, insulin, glucose and Lasix.  Case discussed with nephrology will come and see the patient.  Will admit to the hospital service   Lacretia Leigh, MD 12/27/21 2053

## 2021-12-27 NOTE — Assessment & Plan Note (Addendum)
Mild.  Sodium 132 at the time of admission -Improved

## 2021-12-27 NOTE — Assessment & Plan Note (Addendum)
-  Encephalopathy with myoclonic jerking likely multifactorial secondary to underlying infection, gabapentin toxicity and uremia.  CT head negative for any stroke.  History of seizures -Mental status much improved after HD, more alert and awake, mother at the bedside -Myoclonic jerking also improved, likely due to critical hyperkalemia -Currently on Keppra

## 2021-12-27 NOTE — Assessment & Plan Note (Addendum)
-   Continue metoprolol 25 mg twice daily.  Given Cardizem 10 mg IV x1 for sinus tachycardia.
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
## 2021-12-27 NOTE — Assessment & Plan Note (Addendum)
-  Renal ultrasound showed medical renal disease, left greater than right hydronephrosis, seen on the most recent CT but not present on 10/10/2021.  No urinary stone disease is evident. -Foley catheter placed -Nephrology following

## 2021-12-27 NOTE — ED Triage Notes (Signed)
Pt here POV d/t AMS and Weakness X1 week.

## 2021-12-28 ENCOUNTER — Inpatient Hospital Stay (HOSPITAL_COMMUNITY): Payer: Medicaid Other

## 2021-12-28 DIAGNOSIS — N170 Acute kidney failure with tubular necrosis: Secondary | ICD-10-CM | POA: Diagnosis not present

## 2021-12-28 DIAGNOSIS — N179 Acute kidney failure, unspecified: Secondary | ICD-10-CM | POA: Diagnosis not present

## 2021-12-28 DIAGNOSIS — G9341 Metabolic encephalopathy: Secondary | ICD-10-CM | POA: Diagnosis not present

## 2021-12-28 DIAGNOSIS — I10 Essential (primary) hypertension: Secondary | ICD-10-CM

## 2021-12-28 DIAGNOSIS — L8990 Pressure ulcer of unspecified site, unspecified stage: Secondary | ICD-10-CM | POA: Insufficient documentation

## 2021-12-28 LAB — LACTIC ACID, PLASMA: Lactic Acid, Venous: 1.9 mmol/L (ref 0.5–1.9)

## 2021-12-28 LAB — BASIC METABOLIC PANEL
Anion gap: 19 — ABNORMAL HIGH (ref 5–15)
Anion gap: 16 — ABNORMAL HIGH (ref 5–15)
Anion gap: 15 (ref 5–15)
BUN: 90 mg/dL — ABNORMAL HIGH (ref 6–20)
BUN: 87 mg/dL — ABNORMAL HIGH (ref 6–20)
BUN: 88 mg/dL — ABNORMAL HIGH (ref 6–20)
CO2: 17 mmol/L — ABNORMAL LOW (ref 22–32)
CO2: 19 mmol/L — ABNORMAL LOW (ref 22–32)
CO2: 20 mmol/L — ABNORMAL LOW (ref 22–32)
Calcium: 10.5 mg/dL — ABNORMAL HIGH (ref 8.9–10.3)
Calcium: 10.3 mg/dL (ref 8.9–10.3)
Calcium: 9.8 mg/dL (ref 8.9–10.3)
Chloride: 102 mmol/L (ref 98–111)
Chloride: 104 mmol/L (ref 98–111)
Chloride: 102 mmol/L (ref 98–111)
Creatinine, Ser: 14.03 mg/dL — ABNORMAL HIGH (ref 0.61–1.24)
Creatinine, Ser: 12.71 mg/dL — ABNORMAL HIGH (ref 0.61–1.24)
Creatinine, Ser: 12.31 mg/dL — ABNORMAL HIGH (ref 0.61–1.24)
GFR, Estimated: 4 mL/min — ABNORMAL LOW (ref >60)
GFR, Estimated: 4 mL/min — ABNORMAL LOW (ref >60)
GFR, Estimated: 4 mL/min — ABNORMAL LOW (ref >60)
Glucose, Bld: 70 mg/dL (ref 70–99)
Glucose, Bld: 86 mg/dL (ref 70–99)
Glucose, Bld: 119 mg/dL — ABNORMAL HIGH (ref 70–99)
Potassium: 7.2 mmol/L (ref 3.5–5.1)
Potassium: >7.5 mmol/L (ref 3.5–5.1)
Potassium: >7.5 mmol/L (ref 3.5–5.1)
Sodium: 138 mmol/L (ref 135–145)
Sodium: 139 mmol/L (ref 135–145)
Sodium: 137 mmol/L (ref 135–145)

## 2021-12-28 LAB — RENAL FUNCTION PANEL
Albumin: 3.3 g/dL — ABNORMAL LOW (ref 3.5–5.0)
Anion gap: 16 — ABNORMAL HIGH (ref 5–15)
BUN: 89 mg/dL — ABNORMAL HIGH (ref 6–20)
CO2: 19 mmol/L — ABNORMAL LOW (ref 22–32)
Calcium: 10.4 mg/dL — ABNORMAL HIGH (ref 8.9–10.3)
Chloride: 102 mmol/L (ref 98–111)
Creatinine, Ser: 13.47 mg/dL — ABNORMAL HIGH (ref 0.61–1.24)
GFR, Estimated: 4 mL/min — ABNORMAL LOW (ref >60)
Glucose, Bld: 73 mg/dL (ref 70–99)
Phosphorus: 6.8 mg/dL — ABNORMAL HIGH (ref 2.5–4.6)
Potassium: 7.2 mmol/L (ref 3.5–5.1)
Sodium: 137 mmol/L (ref 135–145)

## 2021-12-28 LAB — CBC
HCT: 35.1 % — ABNORMAL LOW (ref 39.0–52.0)
Hemoglobin: 11.3 g/dL — ABNORMAL LOW (ref 13.0–17.0)
MCH: 26.8 pg (ref 26.0–34.0)
MCHC: 32.2 g/dL (ref 30.0–36.0)
MCV: 83.2 fL (ref 80.0–100.0)
Platelets: 473 10*3/uL — ABNORMAL HIGH (ref 150–400)
RBC: 4.22 MIL/uL (ref 4.22–5.81)
RDW: 15.1 % (ref 11.5–15.5)
WBC: 10.1 10*3/uL (ref 4.0–10.5)
nRBC: 0 % (ref 0.0–0.2)

## 2021-12-28 MED ORDER — SODIUM CHLORIDE 0.45 % IV SOLN
INTRAVENOUS | Status: DC
  Filled 2021-12-28 (×3): qty 75

## 2021-12-28 MED ORDER — DEXTROSE 50 % IV SOLN
1.0000 | Freq: Once | INTRAVENOUS | Status: AC
  Administered 2021-12-28: 50 mL via INTRAVENOUS
  Filled 2021-12-28: qty 50

## 2021-12-28 MED ORDER — HEPARIN SODIUM (PORCINE) 1000 UNIT/ML DIALYSIS
1000.0000 [IU] | INTRAMUSCULAR | Status: DC | PRN
  Filled 2021-12-28: qty 6

## 2021-12-28 MED ORDER — ALBUTEROL SULFATE (2.5 MG/3ML) 0.083% IN NEBU
10.0000 mg | INHALATION_SOLUTION | Freq: Once | RESPIRATORY_TRACT | Status: AC
  Administered 2021-12-28: 10 mg via RESPIRATORY_TRACT
  Filled 2021-12-28: qty 12

## 2021-12-28 MED ORDER — CHLORHEXIDINE GLUCONATE CLOTH 2 % EX PADS: 6.0000 | MEDICATED_PAD | Freq: Every day | CUTANEOUS | Status: DC

## 2021-12-28 MED ORDER — FENTANYL CITRATE PF 50 MCG/ML IJ SOSY
50.0000 ug | PREFILLED_SYRINGE | Freq: Once | INTRAMUSCULAR | Status: AC
  Administered 2021-12-28: 50 ug via INTRAVENOUS
  Filled 2021-12-28: qty 1

## 2021-12-28 MED ORDER — METOPROLOL TARTRATE 5 MG/5ML IV SOLN
5.0000 mg | Freq: Once | INTRAVENOUS | Status: AC
  Administered 2021-12-28: 5 mg via INTRAVENOUS
  Filled 2021-12-28: qty 5

## 2021-12-28 MED ORDER — INSULIN ASPART 100 UNIT/ML IV SOLN
5.0000 [IU] | Freq: Once | INTRAVENOUS | Status: AC
  Administered 2021-12-28: 5 [IU] via INTRAVENOUS

## 2021-12-28 MED ORDER — SODIUM BICARBONATE 8.4 % IV SOLN
50.0000 meq | Freq: Once | INTRAVENOUS | Status: AC
  Administered 2021-12-28: 50 meq via INTRAVENOUS
  Filled 2021-12-28: qty 50

## 2021-12-28 MED ORDER — INSULIN ASPART 100 UNIT/ML IV SOLN
8.0000 [IU] | Freq: Once | INTRAVENOUS | Status: AC
  Administered 2021-12-28: 6 [IU] via INTRAVENOUS

## 2021-12-28 MED ORDER — METOPROLOL TARTRATE 25 MG PO TABS
25.0000 mg | ORAL_TABLET | Freq: Two times a day (BID) | ORAL | Status: DC
Start: 2021-12-28 — End: 2022-01-01
  Administered 2021-12-28 – 2022-01-01 (×8): 25 mg via ORAL
  Filled 2021-12-28 (×8): qty 1

## 2021-12-28 MED ORDER — CALCIUM GLUCONATE-NACL 1-0.675 GM/50ML-% IV SOLN
1.0000 g | Freq: Once | INTRAVENOUS | Status: AC
  Administered 2021-12-28: 1000 mg via INTRAVENOUS
  Filled 2021-12-28: qty 50

## 2021-12-28 MED ORDER — SODIUM BICARBONATE 8.4 % IV SOLN
INTRAVENOUS | Status: AC
  Filled 2021-12-28: qty 50

## 2021-12-28 MED ORDER — HYDRALAZINE HCL 20 MG/ML IJ SOLN
10.0000 mg | Freq: Four times a day (QID) | INTRAMUSCULAR | Status: DC | PRN
  Administered 2021-12-28: 10 mg via INTRAVENOUS
  Filled 2021-12-28 (×2): qty 1

## 2021-12-28 MED ORDER — HEPARIN SODIUM (PORCINE) 1000 UNIT/ML DIALYSIS: 2000.0000 [IU] | INTRAMUSCULAR | Status: DC | PRN

## 2021-12-28 MED ORDER — TAMSULOSIN HCL 0.4 MG PO CAPS
0.4000 mg | ORAL_CAPSULE | Freq: Every day | ORAL | Status: DC
  Administered 2021-12-28 – 2021-12-30 (×3): 0.4 mg via ORAL
  Filled 2021-12-28 (×3): qty 1

## 2021-12-28 MED ORDER — CHLORHEXIDINE GLUCONATE CLOTH 2 % EX PADS
6.0000 | MEDICATED_PAD | Freq: Every day | CUTANEOUS | Status: DC
  Administered 2021-12-28: 6 via TOPICAL

## 2021-12-28 MED ORDER — SODIUM ZIRCONIUM CYCLOSILICATE 10 G PO PACK
10.0000 g | PACK | Freq: Once | ORAL | Status: AC
  Administered 2021-12-28: 10 g via ORAL
  Filled 2021-12-28: qty 1

## 2021-12-28 NOTE — Hospital Course (Addendum)
Patient is a 52 year old male with history of urinary retention and self catheterizes, acute kidney injury, hyperkalemia secondary to obstruction of the urinary system, history of adenocarcinoma of the rectum with metastasis to liver, lung. Presented today with 4-day history of worsening confusion, intermittent shaking and jerking of his extremities.  Has generalized weakness and intermittently has dizziness. On admission, patient was found to have potassium of > 7.5, bicarb 17, creatinine 14.4.  Patient was admitted for further work-up  Due to persistent hyperkalemia, underwent emergent EGD on 2/25 overnight.

## 2021-12-28 NOTE — Procedures (Signed)
Central Venous Catheter Insertion Procedure Note  Yoav Okane  [id number]  1971-01-25  Date:12/28/21  Time:10:26 PM   Provider Performing:Samuel Rittenhouse D Rollene Rotunda   Procedure: Insertion of Non-tunneled Central Venous Catheter(36556)with US guidance (85929)    Indication(s) Hemodialysis  Consent Risks of the procedure as well as the alternatives and risks of each were explained to the patient and/or caregiver.  Consent for the procedure was obtained and is signed in the bedside chart  Anesthesia Topical only with 1% lidocaine   Timeout Verified patient identification, verified procedure, site/side was marked, verified correct patient position, special equipment/implants available, medications/allergies/relevant history reviewed, required imaging and test results available.  Sterile Technique Maximal sterile technique including full sterile barrier drape, hand hygiene, sterile gown, sterile gloves, mask, hair covering, sterile ultrasound probe cover (if used).  Procedure Description Area of catheter insertion was cleaned with chlorhexidine and draped in sterile fashion.   With real-time ultrasound guidance a HD catheter was placed into the right internal jugular vein.  Nonpulsatile blood flow and easy flushing noted in all ports.  The catheter was sutured in place and sterile dressing applied.  Complications/Tolerance None; patient tolerated the procedure well. Chest X-ray is ordered to verify placement for internal jugular or subclavian cannulation.  Chest x-ray is not ordered for femoral cannulation.  EBL Minimal  Specimen(s) None  JD Rexene Agent [facility] 12/28/2021, 10:27 PM  Please see [email or web address] for pager details.  From 7A-7P if no response, please call [phone]. After hours, please call ELink [phone].

## 2021-12-28 NOTE — Progress Notes (Signed)
Patient ID: Bradley Colon, male   DOB: [birth year], 51 y.o.   MRN: [id number] S: Complaining of pain on his penis where catheter is placed.  Marked UOP since foley catheter placed.  Myoclonic jerking has improved slightly.  He does report some nausea but no vomiting. O:BP (!) 171/109    Pulse 85    Temp 99.1 F (37.3 C) (Oral)    Resp 10    SpO2 96%   Intake/Output Summary (Last 24 hours) at 12/28/2021 1155 Last data filed at 12/28/2021 0856 Gross per 24 hour  Intake --  Output 3000 ml  Net -3000 ml   Intake/Output: I/O last 3 completed shifts: In: -  Out: 1000 [Urine:1000]  Intake/Output this shift:  Total I/O In: -  Out: 2000 [Urine:2000] Weight change:  Gen:NAD, slowed mentation CVS:RRR, no rub Resp: CTA Abd: +BS, soft, NT/ND Ext: 1+ pretibial edema bilaterally.   Recent Labs  Lab 12/27/21 1857 12/28/21 0420  NA 132* 138   137  K >7.5* 7.2*   7.2*  CL 98 102   102  CO2 17* 17*   19*  GLUCOSE 94 70   73  BUN 90* 90*   89*  CREATININE 14.40* 14.03*   13.47*  ALBUMIN 3.2* 3.3*  CALCIUM 10.1 10.5*   10.4*  PHOS  --  6.8*  AST 17  --   ALT 10  --    Liver Function Tests: Recent Labs  Lab 12/27/21 1857 12/28/21 0420  AST 17  --   ALT 10  --   ALKPHOS 82  --   BILITOT 0.5  --   PROT 8.0  --   ALBUMIN 3.2* 3.3*   No results for input(s): LIPASE, AMYLASE in the last 168 hours. No results for input(s): AMMONIA in the last 168 hours. CBC: Recent Labs  Lab 12/27/21 1857 12/28/21 0419  WBC 9.1 10.1  NEUTROABS 6.4  --   HGB 10.2* 11.3*  HCT 31.2* 35.1*  MCV 83.2 83.2  PLT 439* 473*   Cardiac Enzymes: No results for input(s): CKTOTAL, CKMB, CKMBINDEX, TROPONINI in the last 168 hours. CBG: No results for input(s): GLUCAP in the last 168 hours.  Iron Studies: No results for input(s): IRON, TIBC, TRANSFERRIN, FERRITIN in the last 72 hours. Studies/Results: CT HEAD WO CONTRAST (5MM)  Result Date: 12/27/2021 CLINICAL DATA:  Mental status change EXAM: CT HEAD  WITHOUT CONTRAST TECHNIQUE: Contiguous axial images were obtained from the base of the skull through the vertex without intravenous contrast. RADIATION DOSE REDUCTION: This exam was performed according to the departmental dose-optimization program which includes automated exposure control, adjustment of the mA and/or kV according to patient size and/or use of iterative reconstruction technique. COMPARISON:  MRI brain 12/05/2021, CT brain 08/17/2010 FINDINGS: Brain: No acute territorial infarction, hemorrhage or intracranial mass. The ventricles are nonenlarged. Vascular: No hyperdense vessels.  No unexpected calcification. Skull: Normal. Negative for fracture or focal lesion. Sinuses/Orbits: Small fluid level left maxillary sinus Other: None IMPRESSION: 1. No CT evidence for acute intracranial abnormality. 2. Left maxillary sinusitis Electronically Signed   By: Donavan Foil M.D.   On: 12/27/2021 19:50   US Renal  Result Date: 12/27/2021 CLINICAL DATA:  Renal failure. EXAM: RENAL / URINARY TRACT ULTRASOUND COMPLETE COMPARISON:  CTs without contrast 12/04/2021 , 10/10/2021 FINDINGS: Factors affecting image quality: Patient body habitus and bowel gas shadowing, with deep location of the kidneys attenuating the ultrasound beam. Right Kidney: Renal measurements: 9.6 x 5.4 x 5.5 cm = volume:  150.47 mL. Echogenicity mildly increased without appreciable cortical volume loss. No mass or stone is seen. Mild hydronephrosis is again noted. Left Kidney: Renal measurements: 10.6 x 7.6 x 5.9 cm = volume: 250.08 mL. Echogenicity mildly increased without cortical volume loss. No mass or stone visualized. There is mild-to-moderate left hydronephrosis which was also seen previously. Bladder: Appears normal for degree of bladder distention. Calculated bladder volume 571.25 mL. Ureteral jets could not be demonstrated. Other: None. IMPRESSION: 1. Mild bilateral increased cortical echogenicity consistent with medical renal disease.  2. Left-greater-than-right hydronephrosis, which was seen on the most recent CT but was not present on 10/10/2021. No urinary stone disease is evident. 3. Technically limited exam. Electronically Signed   By: Telford Nab M.D.   On: 12/27/2021 22:04    feeding supplement  237 mL Oral BID   heparin  5,000 Units Subcutaneous Q8H   metoprolol tartrate  25 mg Oral BID   sodium chloride flush  3 mL Intravenous Q12H   sodium zirconium cyclosilicate  10 g Oral Daily   tamsulosin  0.4 mg Oral Daily    BMET    Component Value Date/Time   NA 137 12/28/2021 0420   NA 138 12/28/2021 0420   NA 143 04/04/2021 1039   K 7.2 (HH) 12/28/2021 0420   K 7.2 (HH) 12/28/2021 0420   CL 102 12/28/2021 0420   CL 102 12/28/2021 0420   CO2 19 (L) 12/28/2021 0420   CO2 17 (L) 12/28/2021 0420   GLUCOSE 73 12/28/2021 0420   GLUCOSE 70 12/28/2021 0420   BUN 89 (H) 12/28/2021 0420   BUN 90 (H) 12/28/2021 0420   BUN 15 04/04/2021 1039   CREATININE 13.47 (H) 12/28/2021 0420   CREATININE 14.03 (H) 12/28/2021 0420   CREATININE 0.81 10/07/2021 1335   CREATININE 1.10 07/22/2016 1540   CALCIUM 10.4 (H) 12/28/2021 0420   CALCIUM 10.5 (H) 12/28/2021 0420   GFRNONAA 4 (L) 12/28/2021 0420   GFRNONAA 4 (L) 12/28/2021 0420   GFRNONAA >60 10/07/2021 1335   GFRNONAA 80 07/22/2016 1540   GFRAA >60 01/02/2020 2046   GFRAA >89 07/22/2016 1540   CBC    Component Value Date/Time   WBC 10.1 12/28/2021 0419   RBC 4.22 12/28/2021 0419   HGB 11.3 (L) 12/28/2021 0419   HGB 11.4 (L) 10/07/2021 1335   HCT 35.1 (L) 12/28/2021 0419   PLT 473 (H) 12/28/2021 0419   PLT 477 (H) 10/07/2021 1335   MCV 83.2 12/28/2021 0419   MCH 26.8 12/28/2021 0419   MCHC 32.2 12/28/2021 0419   RDW 15.1 12/28/2021 0419   LYMPHSABS 1.5 12/27/2021 1857   MONOABS 1.0 12/27/2021 1857   EOSABS 0.2 12/27/2021 1857   BASOSABS 0.1 12/27/2021 1857    Assessment/Plan:  ARF - recurrent and presumably due to obstructive uropathy and likely pre-renal  injury as well.  Real [place] with bilateral hydronephrosis.  Will plan on placement of foley catheter as he is currently not able to perform self-catheterizations in his current state. S/p foley catheter placement with 1000 ml of urine  Continue with Foley and follow UOP and daily SCr Discontinue baclofen and gabapentin in setting of ARF Renal dose meds Maintain MAP >65 Avoid nephrotoxic agents such as IV contrast, NSAIDs, phosphate containing bowel preparations (FLEETS) No emergent indication for dialysis at this time as he does not have ECG changes related to his hyperkalemia and is not volume overloaded. Will continue to follow closely. Hyperkalemia - due to #1.  No ECG  changes.  Will treat medically and hopefully we can avoid dialysis as on his last admission for similar presentation.  Given IV insulin/D50, IV bicarb, Lokelma 10 grams, IV calcium gluconate, and IV lasix.   Will start isotonic bicarb IV.   Recheck K levels and follow closely Continue lokelma 10 grams daily AMS with myoclonic jerking - likely combination of gabapentin toxicity in the setting of ARF.  He also has a history of seizure disorder and may need Keppra. Hyponatremia - due to ARF and likely volume depletion.  Improved with IVF's. 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 Benay Spice. Recent history of Serratia bacteremia - will repeat urine and blood cultures.  He did finish levoquin therapy after he left AMA 3 weeks ago.  HTN - appears to be new and may be related to myoclonic jerking.  Continue to follow.  Given IV metoprolol prn. Hypercalcemia - will start bicarb and follow.   Donetta Potts, MD [employer] [phone]

## 2021-12-28 NOTE — Progress Notes (Signed)
K+ remains > 7.5 despite another round of temporizing measures meant to lower serum K+.  Needs dialysis, have asked CCM team for HD catheter placement, appreciate assistance.    Kelly Splinter, MD 12/28/2021, 8:14 PM

## 2021-12-28 NOTE — Progress Notes (Signed)
[facility]                                                                               Bradley Colon, is a 51 y.o. male, DOB - 20-Aug-1971, WUJ:[id number] Admit date - 12/27/2021    Outpatient Primary MD for the patient is Pcp, No  LOS - 1  days    Brief summary   Patient is a 51 year old male with history of urinary retention and self catheterizes, acute kidney injury, hyperkalemia secondary to obstruction of the urinary system, history of adenocarcinoma of the rectum with metastasis to liver, lung. Presented today with 4-day history of worsening confusion, intermittent shaking and jerking of his extremities.  Has generalized weakness and intermittently has dizziness. On admission, patient was found to have potassium of > 7.5, bicarb 17, creatinine 14.4.  Patient was admitted for further work-up   Assessment & Plan    Assessment and Plan: * ARF (acute renal failure) ([place])- (present on admission) -Renal ultrasound showed medical renal disease, left greater than right hydronephrosis, seen on the most recent CT but not present on 10/10/2021.  No urinary stone disease is evident. -Foley catheter placed -Nephrology following   Essential hypertension - Received IV metoprolol in ED, placed on metoprolol 25 mg twice daily  Seizure ([place]) -Patient had MRI on 12/05/2021, showed no cerebral metastatic disease or acute intracranial abnormality  -EEG on 12/04/2021 was within normal limits.  No seizures or epileptiform discharges were seen -Continue Keppra  Hyponatremia Mild.  Sodium 132 at the time of admission, improved with IV fluids  Acute metabolic encephalopathy- (present on admission) -Encephalopathy likely multifactorial secondary to underlying infection and uremia.  CT head negative for any stroke -Nephrology consulted,    Hyperkalemia- (present on admission) - Potassium 7.2, creatinine 14.0.  Potassium was 7.5 at the time of admission, was treated with  [facility]. 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  -Chemotherapy currently on hold since November, 2022 due to multiple complications, hospitalization at [facility] in 10/2021   Obesity Estimated body mass index is 30.28 kg/m as calculated from the following:   Height as of 12/04/21: 5\' 7"  (1.702 m).   Weight as of 12/04/21: 87.7 kg.  Code Status: Full CODE STATUS DVT Prophylaxis:  heparin injection 5,000 Units Start: 12/27/21 2315   Level of Care: Level of care: Progressive Family Communication: Updated family member at the bedside  Disposition Plan:     Remains inpatient appropriate: Still has electrolyte abnormalities, pending nephrology evaluation   Procedures:  None  Consultants:   Nephrology  Antimicrobials:   Anti-infectives (From admission, onward)    None        Medications   feeding supplement  237 mL Oral BID   heparin  5,000 Units Subcutaneous Q8H   metoprolol tartrate  25 mg Oral BID   sodium chloride flush  3 mL Intravenous Q12H   sodium zirconium cyclosilicate  10 g Oral Daily   tamsulosin  0.4 mg Oral Daily     Subjective:   Bradley  Colon was seen and examined today.  Somewhat somnolent, but family numbers at the bedside did not sleep well last night.  Does not appear to have any discomfort.  No fevers or chills, nausea or vomiting  Objective:   Vitals:   12/28/21 0900 12/28/21 0945 12/28/21 1000 12/28/21 1015  BP: (!) 132/94 (!) 153/100 (!) 140/95 (!) 151/95  Pulse: 83 82 82 85  Resp: 13 16 16 10   Temp:      TempSrc:      SpO2: 96% 95% 96% 96%    Intake/Output Summary (Last 24 hours) at 12/28/2021 1030 Last data filed at 12/28/2021 0856 Gross per 24 hour  Intake --  Output 3000 ml  Net -3000 ml   There were no vitals filed for this  visit.   Exam General: somnolent but arousable Cardiovascular: S1 S2 auscultated, no murmurs, RRR Respiratory: Clear to auscultation bilaterally, no wheezing, rales or rhonchi Gastrointestinal: Soft, nontender, nondistended, + bowel sounds Ext: no pedal edema bilaterally Neuro: global weakness Skin: No rashes Psych: somnolent but arousable   Data Reviewed:  I have personally reviewed following labs    CBC Lab Results  Component Value Date   WBC 10.1 12/28/2021   RBC 4.22 12/28/2021   HGB 11.3 (L) 12/28/2021   HCT 35.1 (L) 12/28/2021   MCV 83.2 12/28/2021   MCH 26.8 12/28/2021   PLT 473 (H) 12/28/2021   MCHC 32.2 12/28/2021   RDW 15.1 12/28/2021   LYMPHSABS 1.5 12/27/2021   MONOABS 1.0 12/27/2021   EOSABS 0.2 12/27/2021   BASOSABS 0.1 76/28/3151     Last metabolic panel Lab Results  Component Value Date   NA 137 12/28/2021   NA 138 12/28/2021   K 7.2 (HH) 12/28/2021   K 7.2 (HH) 12/28/2021   CL 102 12/28/2021   CL 102 12/28/2021   CO2 19 (L) 12/28/2021   CO2 17 (L) 12/28/2021   BUN 89 (H) 12/28/2021   BUN 90 (H) 12/28/2021   CREATININE 13.47 (H) 12/28/2021   CREATININE 14.03 (H) 12/28/2021   GLUCOSE 73 12/28/2021   GLUCOSE 70 12/28/2021   GFRNONAA 4 (L) 12/28/2021   GFRNONAA 4 (L) 12/28/2021   GFRAA >60 01/02/2020   CALCIUM 10.4 (H) 12/28/2021   CALCIUM 10.5 (H) 12/28/2021   PHOS 6.8 (H) 12/28/2021   PROT 8.0 12/27/2021   ALBUMIN 3.3 (L) 12/28/2021   BILITOT 0.5 12/27/2021   ALKPHOS 82 12/27/2021   AST 17 12/27/2021   ALT 10 12/27/2021   ANIONGAP 16 (H) 12/28/2021   ANIONGAP 19 (H) 12/28/2021      Radiology Studies: I have personally reviewed the imaging studies  CT HEAD WO CONTRAST (5MM)  Result Date: 12/27/2021 CLINICAL DATA:  Mental status change EXAM: CT HEAD WITHOUT CONTRAST TECHNIQUE: Contiguous axial images were obtained from the base of the skull through the vertex without intravenous contrast. RADIATION DOSE REDUCTION: This exam was  performed according to the departmental dose-optimization program which includes automated exposure control, adjustment of the mA and/or kV according to patient size and/or use of iterative reconstruction technique. COMPARISON:  MRI brain 12/05/2021, CT brain 08/17/2010 FINDINGS: Brain: No acute territorial infarction, hemorrhage or intracranial mass. The ventricles are nonenlarged. Vascular: No hyperdense vessels.  No unexpected calcification. Skull: Normal. Negative for fracture or focal lesion. Sinuses/Orbits: Small fluid level left maxillary sinus Other: None IMPRESSION: 1. No CT evidence for acute intracranial abnormality. 2. Left maxillary sinusitis Electronically Signed   By: Donavan Foil M.D.   On: 12/27/2021  19:50   US Renal  Result Date: 12/27/2021 CLINICAL DATA:  Renal failure. EXAM: RENAL / URINARY TRACT ULTRASOUND COMPLETE COMPARISON:  CTs without contrast 12/04/2021 , 10/10/2021 FINDINGS: Factors affecting image quality: Patient body habitus and bowel gas shadowing, with deep location of the kidneys attenuating the ultrasound beam. Right Kidney: Renal measurements: 9.6 x 5.4 x 5.5 cm = volume: 150.47 mL. Echogenicity mildly increased without appreciable cortical volume loss. No mass or stone is seen. Mild hydronephrosis is again noted. Left Kidney: Renal measurements: 10.6 x 7.6 x 5.9 cm = volume: 250.08 mL. Echogenicity mildly increased without cortical volume loss. No mass or stone visualized. There is mild-to-moderate left hydronephrosis which was also seen previously. Bladder: Appears normal for degree of bladder distention. Calculated bladder volume 571.25 mL. Ureteral jets could not be demonstrated. Other: None. IMPRESSION: 1. Mild bilateral increased cortical echogenicity consistent with medical renal disease. 2. Left-greater-than-right hydronephrosis, which was seen on the most recent CT but was not present on 10/10/2021. No urinary stone disease is evident. 3. Technically limited exam.  Electronically Signed   By: Telford Nab M.D.   On: 12/27/2021 22:04       Bradley Colon M.D. [facility] 12/28/2021, 10:30 AM  Available via Epic secure chat 7am-7pm After 7 pm, please refer to night coverage provider listed on amion.

## 2021-12-28 NOTE — Progress Notes (Signed)
Notified by RN that repeat potassium is still elevated at 7.2.  Nephrology to be notified of result.  Will retreat with Lokelma, calcium gluconate and insulin, D50 if Nephro agrees and does not want to dialysis Check EKG to monitor for changes with hyperkalemia

## 2021-12-28 NOTE — TOC Initial Note (Signed)
**Note Bradley-Identified via Obfuscation** Transition of Care [facility]) - Initial/Assessment Note    Patient Details  Name: Bradley Colon MRN: [id number] Date of Birth: 12-16-1970  Transition of Care [facility]) CM/SW Contact:    Verdell Carmine, RN Phone Number: 12/28/2021, 10:42 AM  Clinical Narrative:                  Transition of Care Department [facility]) has reviewed patient and no TOC needs have been identified at this time. We will continue to monitor patient advancement through interdisciplinary progression rounds. If new patient transition needs arise, please place a TOC consult.           Patient Goals and CMS Choice        Expected Discharge Plan and Services                                                Prior Living Arrangements/Services                       Activities of Daily Living      Permission Sought/Granted                  Emotional Assessment              Admission diagnosis:  ARF (acute renal failure) ([place]) [N17.9] Patient Active Problem List   Diagnosis Date Noted   ARF (acute renal failure) ([place]) 12/27/2021   Hyponatremia 12/27/2021   Seizure ([place]) 12/27/2021   Essential hypertension 12/27/2021   Bacterial infection due to Serratia 12/05/2021   Severe sepsis ([place]) due to UTI / pyelonephritis 12/04/2021   Hyperkalemia 12/04/2021   Acute kidney injury (AKI) with acute tubular necrosis (ATN) ([place]) 85/46/2703   Acute metabolic encephalopathy 50/07/3817   Grand mal seizure ([place]) 12/04/2021   Acute bacterial pyelonephritis 29/93/7169   Metabolic acidosis 67/89/3810   Hypoglycemia 12/04/2021   Palliative care patient 10/11/2021   Lung nodules 07/10/2021   Goals of care, counseling/discussion 10/25/2020   Port-A-Cath in place 04/12/2020   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[place]) 03/02/2019   Reactive airway disease 03/02/2019   Vegetarian 03/02/2019   Adjustment disorder with mixed anxiety and depressed mood 03/02/2019   Chronic pain after traumatic  injury 03/02/2019   Change in bowel habits 01/31/2019   Constipation 01/31/2019   Grade III hemorrhoids 01/31/2019   Rectal pain 01/31/2019   Fibromyalgia 12/18/2017   Dysesthesia affecting both sides of body 12/18/2017   Spondylosis without myelopathy or radiculopathy, lumbar region 12/18/2017   Chronic post-traumatic stress disorder (PTSD) 05/25/2017   Other fatigue 07/27/2016   Chronic left shoulder pain 07/22/2016   Chronic neck pain 11/19/2015   Chronic low back pain 09/18/2014   PCP:  Pcp, No Pharmacy:   [id number] - [place], [place] [address] [place] [address] Phone: [phone] Fax: [phone]     Social Determinants of Health (SDOH) Interventions    Readmission Risk Interventions No flowsheet data found.

## 2021-12-29 DIAGNOSIS — G9341 Metabolic encephalopathy: Secondary | ICD-10-CM | POA: Diagnosis not present

## 2021-12-29 DIAGNOSIS — N179 Acute kidney failure, unspecified: Secondary | ICD-10-CM | POA: Diagnosis not present

## 2021-12-29 DIAGNOSIS — N170 Acute kidney failure with tubular necrosis: Secondary | ICD-10-CM | POA: Diagnosis not present

## 2021-12-29 LAB — RENAL FUNCTION PANEL
Albumin: 2.5 g/dL — ABNORMAL LOW (ref 3.5–5.0)
Anion gap: 14 (ref 5–15)
BUN: 26 mg/dL — ABNORMAL HIGH (ref 6–20)
CO2: 25 mmol/L (ref 22–32)
Calcium: 8.9 mg/dL (ref 8.9–10.3)
Chloride: 100 mmol/L (ref 98–111)
Creatinine, Ser: 5.35 mg/dL — ABNORMAL HIGH (ref 0.61–1.24)
GFR, Estimated: 12 mL/min — ABNORMAL LOW (ref >60)
Glucose, Bld: 100 mg/dL — ABNORMAL HIGH (ref 70–99)
Phosphorus: 4.5 mg/dL (ref 2.5–4.6)
Potassium: 4.8 mmol/L (ref 3.5–5.1)
Sodium: 139 mmol/L (ref 135–145)

## 2021-12-29 LAB — POTASSIUM
Potassium: 4.2 mmol/L (ref 3.5–5.1)
Potassium: 4.8 mmol/L (ref 3.5–5.1)
Potassium: 5.3 mmol/L — ABNORMAL HIGH (ref 3.5–5.1)
Potassium: 7.4 mmol/L (ref 3.5–5.1)

## 2021-12-29 LAB — CBC
HCT: 29.5 % — ABNORMAL LOW (ref 39.0–52.0)
Hemoglobin: 9.7 g/dL — ABNORMAL LOW (ref 13.0–17.0)
MCH: 27 pg (ref 26.0–34.0)
MCHC: 32.9 g/dL (ref 30.0–36.0)
MCV: 82.2 fL (ref 80.0–100.0)
Platelets: 321 10*3/uL (ref 150–400)
RBC: 3.59 MIL/uL — ABNORMAL LOW (ref 4.22–5.81)
RDW: 15.3 % (ref 11.5–15.5)
WBC: 10.7 10*3/uL — ABNORMAL HIGH (ref 4.0–10.5)
nRBC: 0 % (ref 0.0–0.2)

## 2021-12-29 LAB — URINE CULTURE: Culture: NO GROWTH

## 2021-12-29 LAB — HEPATITIS B SURFACE ANTIGEN: Hepatitis B Surface Ag: NONREACTIVE

## 2021-12-29 LAB — HEPATITIS B SURFACE ANTIBODY,QUALITATIVE: Hep B S Ab: NONREACTIVE

## 2021-12-29 LAB — GLUCOSE, CAPILLARY: Glucose-Capillary: 136 mg/dL — ABNORMAL HIGH (ref 70–99)

## 2021-12-29 MED ORDER — DILTIAZEM HCL 25 MG/5ML IV SOLN
10.0000 mg | Freq: Once | INTRAVENOUS | Status: AC
  Administered 2021-12-29: 10 mg via INTRAVENOUS
  Filled 2021-12-29: qty 5

## 2021-12-29 MED ORDER — LORAZEPAM 2 MG/ML IJ SOLN: 1.0000 mg | Freq: Once | INTRAMUSCULAR | Status: DC

## 2021-12-29 MED ORDER — SODIUM CHLORIDE 0.9% FLUSH
10.0000 mL | Freq: Two times a day (BID) | INTRAVENOUS | Status: DC
  Administered 2021-12-29 – 2022-01-01 (×6): 10 mL

## 2021-12-29 MED ORDER — SODIUM ZIRCONIUM CYCLOSILICATE 10 G PO PACK
10.0000 g | PACK | Freq: Once | ORAL | Status: AC
  Administered 2021-12-29: 10 g via ORAL
  Filled 2021-12-29: qty 1

## 2021-12-29 MED ORDER — SODIUM CHLORIDE 0.9% FLUSH
10.0000 mL | INTRAVENOUS | Status: DC | PRN
  Administered 2022-01-01: 10 mL

## 2021-12-29 NOTE — Progress Notes (Signed)
Patient ID: Bradley Colon, male   DOB: 19-May-1971, 51 y.o.   MRN: [id number] S: Repeat K was 7.4 so temp RIJ HD cath placed and underwent HD yesterday with marked improvement of K, BUN, and Cr.  More awake and alert today but agitated. O:BP (!) 146/94 (BP Location: Left Arm)    Pulse 93    Temp 98.5 F (36.9 C) (Oral)    Resp 17    Wt 78 kg    SpO2 97%    BMI 26.93 kg/m   Intake/Output Summary (Last 24 hours) at 12/29/2021 1227 Last data filed at 12/29/2021 1000 Gross per 24 hour  Intake 1859.7 ml  Output 3180 ml  Net -1320.3 ml   Intake/Output: I/O last 3 completed shifts: In: 1646.2 [I.V.:1435.4; IV Piggyback:210.8] Out: 1027 [Urine:6175]  Intake/Output this shift:  Total I/O In: 213.5 [I.V.:213.5] Out: 305 [Urine:305] Weight change:  OZD:GUYQIHKV and on his hands and knees in bed CVS:RRR, no rub Resp:CTA Abd: +BS, soft, NT/ND Ext: no edema  Recent Labs  Lab 12/27/21 1857 12/28/21 0420 12/28/21 1501 12/28/21 1754 12/29/21 0000 12/29/21 0500 12/29/21 0530  NA 132* 138   137 139 137  --  139  --   K >7.5* 7.2*   7.2* >7.5* >7.5* 7.4* 4.8 4.8  CL 98 102   102 104 102  --  100  --   CO2 17* 17*   19* 19* 20*  --  25  --   GLUCOSE 94 70   73 86 119*  --  100*  --   BUN 90* 90*   89* 87* 88*  --  26*  --   CREATININE 14.40* 14.03*   13.47* 12.71* 12.31*  --  5.35*  --   ALBUMIN 3.2* 3.3*  --   --   --  2.5*  --   CALCIUM 10.1 10.5*   10.4* 10.3 9.8  --  8.9  --   PHOS  --  6.8*  --   --   --  4.5  --   AST 17  --   --   --   --   --   --   ALT 10  --   --   --   --   --   --    Liver Function Tests: Recent Labs  Lab 12/27/21 1857 12/28/21 0420 12/29/21 0500  AST 17  --   --   ALT 10  --   --   ALKPHOS 82  --   --   BILITOT 0.5  --   --   PROT 8.0  --   --   ALBUMIN 3.2* 3.3* 2.5*   No results for input(s): LIPASE, AMYLASE in the last 168 hours. No results for input(s): AMMONIA in the last 168 hours. CBC: Recent Labs  Lab 12/27/21 1857 12/28/21 0419  WBC 9.1  10.1  NEUTROABS 6.4  --   HGB 10.2* 11.3*  HCT 31.2* 35.1*  MCV 83.2 83.2  PLT 439* 473*   Cardiac Enzymes: No results for input(s): CKTOTAL, CKMB, CKMBINDEX, TROPONINI in the last 168 hours. CBG: No results for input(s): GLUCAP in the last 168 hours.  Iron Studies: No results for input(s): IRON, TIBC, TRANSFERRIN, FERRITIN in the last 72 hours. Studies/Results: DG Chest 1 View  Result Date: 12/28/2021 CLINICAL DATA:  Central line placement EXAM: CHEST  1 VIEW COMPARISON:  12/03/2021 FINDINGS: Right Port-A-Cath remains in place with the tip in the upper right atrium. Interval  placement of right internal jugular central line with the tip in the SVC. No pneumothorax. Numerous bilateral nodules and masses compatible with metastatic disease, unchanged. Heart is normal size. No effusions. IMPRESSION: Right internal jugular central line tip in the SVC. No pneumothorax. Numerous bilateral pulmonary nodules and masses compatible with metastatic disease, unchanged. Electronically Signed   By: Rolm Baptise M.D.   On: 12/28/2021 22:29   CT HEAD WO CONTRAST (5MM)  Result Date: 12/27/2021 CLINICAL DATA:  Mental status change EXAM: CT HEAD WITHOUT CONTRAST TECHNIQUE: Contiguous axial images were obtained from the base of the skull through the vertex without intravenous contrast. RADIATION DOSE REDUCTION: This exam was performed according to the departmental dose-optimization program which includes automated exposure control, adjustment of the mA and/or kV according to patient size and/or use of iterative reconstruction technique. COMPARISON:  MRI brain 12/05/2021, CT brain 08/17/2010 FINDINGS: Brain: No acute territorial infarction, hemorrhage or intracranial mass. The ventricles are nonenlarged. Vascular: No hyperdense vessels.  No unexpected calcification. Skull: Normal. Negative for fracture or focal lesion. Sinuses/Orbits: Small fluid level left maxillary sinus Other: None IMPRESSION: 1. No CT evidence for  acute intracranial abnormality. 2. Left maxillary sinusitis Electronically Signed   By: Donavan Foil M.D.   On: 12/27/2021 19:50   US Renal  Result Date: 12/27/2021 CLINICAL DATA:  Renal failure. EXAM: RENAL / URINARY TRACT ULTRASOUND COMPLETE COMPARISON:  CTs without contrast 12/04/2021 , 10/10/2021 FINDINGS: Factors affecting image quality: Patient body habitus and bowel gas shadowing, with deep location of the kidneys attenuating the ultrasound beam. Right Kidney: Renal measurements: 9.6 x 5.4 x 5.5 cm = volume: 150.47 mL. Echogenicity mildly increased without appreciable cortical volume loss. No mass or stone is seen. Mild hydronephrosis is again noted. Left Kidney: Renal measurements: 10.6 x 7.6 x 5.9 cm = volume: 250.08 mL. Echogenicity mildly increased without cortical volume loss. No mass or stone visualized. There is mild-to-moderate left hydronephrosis which was also seen previously. Bladder: Appears normal for degree of bladder distention. Calculated bladder volume 571.25 mL. Ureteral jets could not be demonstrated. Other: None. IMPRESSION: 1. Mild bilateral increased cortical echogenicity consistent with medical renal disease. 2. Left-greater-than-right hydronephrosis, which was seen on the most recent CT but was not present on 10/10/2021. No urinary stone disease is evident. 3. Technically limited exam. Electronically Signed   By: Telford Nab M.D.   On: 12/27/2021 22:04    Chlorhexidine Gluconate Cloth  6 each Topical Q0600   feeding supplement  237 mL Oral BID   heparin  5,000 Units Subcutaneous Q8H   LORazepam  1-2 mg Intravenous Once in dialysis   metoprolol tartrate  25 mg Oral BID   sodium chloride flush  10-40 mL Intracatheter Q12H   sodium chloride flush  3 mL Intravenous Q12H   sodium zirconium cyclosilicate  10 g Oral Daily   tamsulosin  0.4 mg Oral Daily    BMET    Component Value Date/Time   NA 139 12/29/2021 0500   NA 143 04/04/2021 1039   K 4.8 12/29/2021 0530   CL  100 12/29/2021 0500   CO2 25 12/29/2021 0500   GLUCOSE 100 (H) 12/29/2021 0500   BUN 26 (H) 12/29/2021 0500   BUN 15 04/04/2021 1039   CREATININE 5.35 (H) 12/29/2021 0500   CREATININE 0.81 10/07/2021 1335   CREATININE 1.10 07/22/2016 1540   CALCIUM 8.9 12/29/2021 0500   GFRNONAA 12 (L) 12/29/2021 0500   GFRNONAA >60 10/07/2021 1335   GFRNONAA 80 07/22/2016 1540  GFRAA >60 01/02/2020 2046   GFRAA >89 07/22/2016 1540   CBC    Component Value Date/Time   WBC 10.1 12/28/2021 0419   RBC 4.22 12/28/2021 0419   HGB 11.3 (L) 12/28/2021 0419   HGB 11.4 (L) 10/07/2021 1335   HCT 35.1 (L) 12/28/2021 0419   PLT 473 (H) 12/28/2021 0419   PLT 477 (H) 10/07/2021 1335   MCV 83.2 12/28/2021 0419   MCH 26.8 12/28/2021 0419   MCHC 32.2 12/28/2021 0419   RDW 15.1 12/28/2021 0419   LYMPHSABS 1.5 12/27/2021 1857   MONOABS 1.0 12/27/2021 1857   EOSABS 0.2 12/27/2021 1857   BASOSABS 0.1 12/27/2021 1857    Assessment/Plan:  ARF, non-oliguric - recurrent and presumably due to obstructive uropathy and likely pre-renal injury as well.  Real [place] with bilateral hydronephrosis.  Will plan on placement of foley catheter as he is currently not able to perform self-catheterizations in his current state. S/p foley catheter placement with 1000 ml of urine  Continue with Foley and follow UOP (5 liters over past 24 hours) and daily SCr Discontinue baclofen and gabapentin in setting of ARF Renal dose meds Maintain MAP >65 Avoid nephrotoxic agents such as IV contrast, NSAIDs, phosphate containing bowel preparations (FLEETS) S/p emergent HD last night/early this morning for persistent hyperkalemia. Will continue to follow closely. Hyperkalemia - due to #1.  No ECG changes.  Will treat medically and hopefully we can avoid dialysis as on his last admission for similar presentation.  Given IV insulin/D50, IV bicarb, Lokelma 10 grams, IV calcium gluconate, and IV lasix.   S/p urgent HD on 12/28/21 for persistent  hyperkalemia despite good UOP and improving BUN/Cr Continue isotonic bicarb IV.   Recheck K levels and follow closely Continue lokelma 10 grams daily AMS with myoclonic jerking - likely combination of gabapentin toxicity in the setting of ARF.  He also has a history of seizure disorder and may need Keppra. Hyponatremia - due to ARF and likely volume depletion.  Improved with IVF's. 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 Benay Spice. Recent history of Serratia bacteremia - will repeat urine and blood cultures.  He did finish levoquin therapy after he left AMA 3 weeks ago.  HTN - appears to be new and may be related to myoclonic jerking.  Continue to follow.  Given IV metoprolol prn. Hypercalcemia - improved with bicarb.   Donetta Potts, MD [employer] [phone]

## 2021-12-29 NOTE — Plan of Care (Signed)

## 2021-12-29 NOTE — Progress Notes (Addendum)
HOSPITAL MEDICINE OVERNIGHT EVENT NOTE    Notified by nursing that patient exhibited substantial hyperkalemia that persisted yesterday evening.    Dr. Soyla Murphy with nephrology was notified who then proceeded to coordinate with CCM for placement of a hemodialysis catheter.  The hemodialysis catheter was placed successfully and patient underwent hemodialysis successfully with repeat potassium this morning resulting at 4.8.  Nursing is also notified me that patient has been exhibiting persisting sinus tachycardia since yesterday afternoon.  Heart rates this morning have been as high as 130 bpm.  Patient is not experiencing any significant pain at this time.  Patient is not in respiratory distress.  Blood pressures are stable.  Patient is not febrile.  Patient is not exhibiting any significant anxiety.  Twelve-lead EKG obtained confirming sinus tachycardia.    Cause of the sinus tachycardia is unclear at this time.  We will obtain a repeat CBC this morning to ensure patient is not developing any significant acute anemia status post hemodialysis catheter placement.  Continue to monitor on telemetry in the meantime.  Vernelle Emerald  MD [facility]

## 2021-12-29 NOTE — Progress Notes (Addendum)
[facility]                                                                               [name], is a 51 y.o. 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 Presented today with 4-day history of worsening confusion, intermittent shaking and jerking of his extremities.  Has generalized weakness and intermittently has dizziness. On admission, patient was found to have potassium of > 7.5, bicarb 17, creatinine 14.4.  Patient was admitted for further work-up  Due to persistent hyperkalemia, underwent emergent EGD on 2/25 overnight.   Assessment & Plan    Assessment and Plan: * ARF (acute renal failure) ([place])- (present on admission) -Renal ultrasound showed medical renal disease, left greater than right hydronephrosis, seen on the most recent CT but not present on 10/10/2021.  No urinary stone disease is evident. -Foley catheter placed -Nephrology following   Essential hypertension - Continue metoprolol 25 mg twice daily.  Given Cardizem 10 mg IV x1 for sinus tachycardia.  Seizure [facility]) -Patient had MRI on 12/05/2021, showed no cerebral metastatic disease or acute intracranial abnormality  -EEG on 12/04/2021 was within normal limits.  No seizures or epileptiform discharges were seen -Continue Keppra.  -Myoclonic jerking also improved after HD.  Hyponatremia Mild.  Sodium 132 at the time of admission -Improved  Acute metabolic encephalopathy- (present on admission) -Encephalopathy with myoclonic jerking likely multifactorial secondary to underlying infection, gabapentin toxicity and uremia.  CT head negative for any stroke.   History of seizures -Mental status much improved after HD, more alert and awake, mother at the bedside -Myoclonic jerking also improved, likely due to critical hyperkalemia -Currently on Keppra   Hyperkalemia- (present on admission) - Potassium 7.2, creatinine 14.0.  Potassium was 7.5 at the time of admission, was treated with [facility]. 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  -Chemotherapy currently on hold since November, 2022 due to multiple complications, hospitalization at [facility] in 10/2021   Pressure Injury Documentation: Pressure Injury 12/28/21 Buttocks Right Stage 2 -  Partial thickness loss of dermis presenting as a shallow open injury with a red, pink wound bed without slough. (Active)  12/28/21 1532  Location: Buttocks  Location Orientation: Right  Staging: Stage 2 -  Partial thickness loss of dermis presenting as a shallow open injury with a red, pink wound bed without slough.  Wound Description (Comments):   Present on Admission: Yes   Code Status: Full code DVT Prophylaxis:  heparin injection 5,000 Units Start: 12/27/21 2315   Level of Care: Level of care: Progressive Family Communication: Updated patient's mother at the bedside  Disposition Plan:     Remains inpatient appropriate: Started on HD on 2/25 Procedures:  Nontunneled Central venous catheter insertion for HD Hemodialysis  Consultants:   Nephrology  Medications   Chlorhexidine Gluconate Cloth  6 each Topical Q0600   feeding supplement  237 mL Oral BID   heparin  5,000 Units Subcutaneous Q8H   LORazepam  1-2 mg Intravenous Once in dialysis   metoprolol tartrate  25 mg Oral BID   sodium chloride flush  10-40 mL Intracatheter  Q12H   sodium chloride flush  3 mL Intravenous Q12H   sodium zirconium cyclosilicate  10 g Oral Daily   tamsulosin  0.4 mg Oral Daily     Subjective:   Bradley Colon was seen and examined today.  Much more alert and awake, oriented to self and person.  Denies any pain.  No acute chest pain, shortness of breath abdominal pain.  No nausea or vomiting.  Per mother at the bedside, mental status and myoclonic jerking has been improving after HD.  Objective:   Vitals:   12/29/21 0513 12/29/21 0600 12/29/21 0818 12/29/21 1155  BP: (!) 135/102 (!) 105/91 108/73 (!) 146/94  Pulse:   (!) 120 93  Resp:  16 17 17   Temp: 98.4 F (36.9 C)  98.6 F (37 C) 98.5 F (36.9 C)  TempSrc: Axillary  Axillary Oral  SpO2: 95%   97%  Weight:        Intake/Output Summary (Last 24 hours) at 12/29/2021 1344 Last data filed at 12/29/2021 1000 Gross per 24 hour  Intake 1859.7 ml  Output 3180 ml  Net -1320.3 ml   Filed Weights   12/28/21 2309  Weight: 78 kg     Exam General: Alert and oriented x self and person Cardiovascular: S1 S2 auscultated,  RRR Respiratory: Diminished breath sound at the bases Gastrointestinal: Soft, nontender, nondistended, + bowel sounds Ext: no pedal edema bilaterally Neuro: moving all 4 extremities   Data Reviewed:  I have personally reviewed following labs    CBC Lab Results  Component Value Date   WBC 10.7 (H) 12/29/2021   RBC 3.59 (L) 12/29/2021   HGB 9.7 (L) 12/29/2021   HCT 29.5 (L) 12/29/2021   MCV 82.2 12/29/2021   MCH 27.0 12/29/2021   PLT 321 12/29/2021   MCHC 32.9 12/29/2021   RDW 15.3 12/29/2021   LYMPHSABS 1.5 12/27/2021   MONOABS 1.0 12/27/2021   EOSABS 0.2 12/27/2021   BASOSABS 0.1 58/07/9832     Last metabolic panel Lab Results  Component Value Date   NA 139 12/29/2021   K 4.8 12/29/2021   CL 100 12/29/2021   CO2 25 12/29/2021   BUN 26 (H) 12/29/2021   CREATININE 5.35 (H) 12/29/2021   GLUCOSE 100 (H) 12/29/2021   GFRNONAA 12 (L)  12/29/2021   GFRAA >60 01/02/2020   CALCIUM 8.9 12/29/2021   PHOS 4.5 12/29/2021   PROT 8.0 12/27/2021   ALBUMIN 2.5 (L) 12/29/2021   BILITOT 0.5 12/27/2021   ALKPHOS 82 12/27/2021   AST 17 12/27/2021   ALT 10 12/27/2021   ANIONGAP 14 12/29/2021      Radiology Studies:   I have personally reviewed the imaging studies  DG Chest 1 View  Result Date: 12/28/2021 IMPRESSION: Right internal jugular central line tip in the SVC. No pneumothorax. Numerous bilateral pulmonary nodules and masses compatible with metastatic disease, unchanged. Electronically Signed   By: Rolm Baptise M.D.   On: 12/28/2021 22:29    Jawanda Passey  M.D. [facility] 12/29/2021, 1:44 PM  Available via Epic secure chat 7am-7pm After 7 pm, please refer to night coverage provider listed on amion.

## 2021-12-30 DIAGNOSIS — N170 Acute kidney failure with tubular necrosis: Secondary | ICD-10-CM | POA: Diagnosis not present

## 2021-12-30 DIAGNOSIS — C7800 Secondary malignant neoplasm of unspecified lung: Secondary | ICD-10-CM

## 2021-12-30 DIAGNOSIS — C20 Malignant neoplasm of rectum: Secondary | ICD-10-CM | POA: Diagnosis not present

## 2021-12-30 DIAGNOSIS — E875 Hyperkalemia: Secondary | ICD-10-CM

## 2021-12-30 DIAGNOSIS — G9341 Metabolic encephalopathy: Secondary | ICD-10-CM | POA: Diagnosis not present

## 2021-12-30 LAB — RENAL FUNCTION PANEL
Albumin: 2.4 g/dL — ABNORMAL LOW (ref 3.5–5.0)
Anion gap: 10 (ref 5–15)
BUN: 27 mg/dL — ABNORMAL HIGH (ref 6–20)
CO2: 28 mmol/L (ref 22–32)
Calcium: 8.7 mg/dL — ABNORMAL LOW (ref 8.9–10.3)
Chloride: 101 mmol/L (ref 98–111)
Creatinine, Ser: 5.29 mg/dL — ABNORMAL HIGH (ref 0.61–1.24)
GFR, Estimated: 12 mL/min — ABNORMAL LOW (ref >60)
Glucose, Bld: 114 mg/dL — ABNORMAL HIGH (ref 70–99)
Phosphorus: 4 mg/dL (ref 2.5–4.6)
Potassium: 3.8 mmol/L (ref 3.5–5.1)
Sodium: 139 mmol/L (ref 135–145)

## 2021-12-30 LAB — CBC
HCT: 28.4 % — ABNORMAL LOW (ref 39.0–52.0)
Hemoglobin: 9.3 g/dL — ABNORMAL LOW (ref 13.0–17.0)
MCH: 27 pg (ref 26.0–34.0)
MCHC: 32.7 g/dL (ref 30.0–36.0)
MCV: 82.3 fL (ref 80.0–100.0)
Platelets: 295 10*3/uL (ref 150–400)
RBC: 3.45 MIL/uL — ABNORMAL LOW (ref 4.22–5.81)
RDW: 15.3 % (ref 11.5–15.5)
WBC: 11.9 10*3/uL — ABNORMAL HIGH (ref 4.0–10.5)
nRBC: 0 % (ref 0.0–0.2)

## 2021-12-30 LAB — POTASSIUM
Potassium: 3.6 mmol/L (ref 3.5–5.1)
Potassium: 4.4 mmol/L (ref 3.5–5.1)

## 2021-12-30 LAB — HEPATITIS B SURFACE ANTIBODY, QUANTITATIVE: Hep B S AB Quant (Post): <3.1 m[IU]/mL — ABNORMAL LOW (ref >9.9)

## 2021-12-30 MED ORDER — SODIUM ZIRCONIUM CYCLOSILICATE 10 G PO PACK: 10.0000 g | PACK | Freq: Every day | ORAL | Status: DC

## 2021-12-30 MED ORDER — OXYCODONE HCL 5 MG PO TABS
20.0000 mg | ORAL_TABLET | Freq: Once | ORAL | Status: AC
  Administered 2021-12-30: 20 mg via ORAL
  Filled 2021-12-30: qty 4

## 2021-12-30 MED ORDER — OXYCODONE HCL 5 MG PO TABS
10.0000 mg | ORAL_TABLET | ORAL | Status: DC | PRN
  Administered 2021-12-30 – 2022-01-01 (×9): 10 mg via ORAL
  Filled 2021-12-30 (×9): qty 2

## 2021-12-30 NOTE — Progress Notes (Signed)
Patient ID: Bradley Colon, male   DOB: May 04, 1971, 51 y.o.   MRN: [id number] S:  SCr stable at 5.3 UOP not fully recorded I believe, plenty in the foley bag K down to 3.6, still on Lokelma Remais on IVF NaHCO3.    O:BP (!) 150/98 (BP Location: Right Arm)    Pulse (!) 105    Temp 98.1 F (36.7 C) (Oral)    Resp 18    Wt 78 kg    SpO2 94%    BMI 26.93 kg/m   Intake/Output Summary (Last 24 hours) at 12/30/2021 1128 Last data filed at 12/30/2021 0300 Gross per 24 hour  Intake 856.7 ml  Output --  Net 856.7 ml    Intake/Output: I/O last 3 completed shifts: In: 1913.6 [I.V.:1913.6] Out: 5 [Urine:305]  Intake/Output this shift:  No intake/output data recorded. Weight change:   Gen: NAD, lying in bed CVS:RRR, no rub Resp:CTA Abd: +BS, soft, NT/ND Ext: no edema  Recent Labs  Lab 12/27/21 1857 12/28/21 0420 12/28/21 1501 12/28/21 1754 12/29/21 0000 12/29/21 0500 12/29/21 0530 12/29/21 1233 12/29/21 2010 12/30/21 0015 12/30/21 0400 12/30/21 0803  NA 132* 138   137 139 137  --  139  --   --   --   --  139  --   K >7.5* 7.2*   7.2* >7.5* >7.5*   < > 4.8 4.8 5.3* 4.2 4.4 3.8 3.6  CL 98 102   102 104 102  --  100  --   --   --   --  101  --   CO2 17* 17*   19* 19* 20*  --  25  --   --   --   --  28  --   GLUCOSE 94 70   73 86 119*  --  100*  --   --   --   --  114*  --   BUN 90* 90*   89* 87* 88*  --  26*  --   --   --   --  27*  --   CREATININE 14.40* 14.03*   13.47* 12.71* 12.31*  --  5.35*  --   --   --   --  5.29*  --   ALBUMIN 3.2* 3.3*  --   --   --  2.5*  --   --   --   --  2.4*  --   CALCIUM 10.1 10.5*   10.4* 10.3 9.8  --  8.9  --   --   --   --  8.7*  --   PHOS  --  6.8*  --   --   --  4.5  --   --   --   --  4.0  --   AST 17  --   --   --   --   --   --   --   --   --   --   --   ALT 10  --   --   --   --   --   --   --   --   --   --   --    < > = values in this interval not displayed.    Liver Function Tests: Recent Labs  Lab 12/27/21 1857 12/28/21 0420  12/29/21 0500 12/30/21 0400  AST 17  --   --   --   ALT 10  --   --   --  ALKPHOS 82  --   --   --   BILITOT 0.5  --   --   --   PROT 8.0  --   --   --   ALBUMIN 3.2* 3.3* 2.5* 2.4*    No results for input(s): LIPASE, AMYLASE in the last 168 hours. No results for input(s): AMMONIA in the last 168 hours. CBC: Recent Labs  Lab 12/27/21 1857 12/28/21 0419 12/29/21 1233 12/30/21 0400  WBC 9.1 10.1 10.7* 11.9*  NEUTROABS 6.4  --   --   --   HGB 10.2* 11.3* 9.7* 9.3*  HCT 31.2* 35.1* 29.5* 28.4*  MCV 83.2 83.2 82.2 82.3  PLT 439* 473* 321 295    Cardiac Enzymes: No results for input(s): CKTOTAL, CKMB, CKMBINDEX, TROPONINI in the last 168 hours. CBG: Recent Labs  Lab 12/29/21 2034  GLUCAP 136*    Iron Studies: No results for input(s): IRON, TIBC, TRANSFERRIN, FERRITIN in the last 72 hours. Studies/Results: DG Chest 1 View  Result Date: 12/28/2021 CLINICAL DATA:  Central line placement EXAM: CHEST  1 VIEW COMPARISON:  12/03/2021 FINDINGS: Right Port-A-Cath remains in place with the tip in the upper right atrium. Interval placement of right internal jugular central line with the tip in the SVC. No pneumothorax. Numerous bilateral nodules and masses compatible with metastatic disease, unchanged. Heart is normal size. No effusions. IMPRESSION: Right internal jugular central line tip in the SVC. No pneumothorax. Numerous bilateral pulmonary nodules and masses compatible with metastatic disease, unchanged. Electronically Signed   By: Rolm Baptise M.D.   On: 12/28/2021 22:29    Chlorhexidine Gluconate Cloth  6 each Topical Q0600   feeding supplement  237 mL Oral BID   heparin  5,000 Units Subcutaneous Q8H   LORazepam  1-2 mg Intravenous Once in dialysis   metoprolol tartrate  25 mg Oral BID   sodium chloride flush  10-40 mL Intracatheter Q12H   sodium chloride flush  3 mL Intravenous Q12H   tamsulosin  0.4 mg Oral Daily    BMET    Component Value Date/Time   NA 139  12/30/2021 0400   NA 143 04/04/2021 1039   K 3.6 12/30/2021 0803   CL 101 12/30/2021 0400   CO2 28 12/30/2021 0400   GLUCOSE 114 (H) 12/30/2021 0400   BUN 27 (H) 12/30/2021 0400   BUN 15 04/04/2021 1039   CREATININE 5.29 (H) 12/30/2021 0400   CREATININE 0.81 10/07/2021 1335   CREATININE 1.10 07/22/2016 1540   CALCIUM 8.7 (L) 12/30/2021 0400   GFRNONAA 12 (L) 12/30/2021 0400   GFRNONAA >60 10/07/2021 1335   GFRNONAA 80 07/22/2016 1540   GFRAA >60 01/02/2020 2046   GFRAA >89 07/22/2016 1540   CBC    Component Value Date/Time   WBC 11.9 (H) 12/30/2021 0400   RBC 3.45 (L) 12/30/2021 0400   HGB 9.3 (L) 12/30/2021 0400   HGB 11.4 (L) 10/07/2021 1335   HCT 28.4 (L) 12/30/2021 0400   PLT 295 12/30/2021 0400   PLT 477 (H) 10/07/2021 1335   MCV 82.3 12/30/2021 0400   MCH 27.0 12/30/2021 0400   MCHC 32.7 12/30/2021 0400   RDW 15.3 12/30/2021 0400   LYMPHSABS 1.5 12/27/2021 1857   MONOABS 1.0 12/27/2021 1857   EOSABS 0.2 12/27/2021 1857   BASOSABS 0.1 12/27/2021 1857    Assessment/Plan:  ARF, non-oliguric - recurrent and presumably due to obstructive uropathy and likely pre-renal injury as well.  Real [place] with bilateral hydronephrosis.  Has improved  with placement of foley catheter. Cont Foley Held baclofen and gabapentin in setting of ARF S/p emergent HD over the weekend for persistent hyperkalemia. No further HD needed at this time Will continue to follow closely. Hyperkalemia - due to #1.  S/p urgent HD on 12/28/21 for persistent hyperkalemia despite good UOP and improving BUN/Cr Hold Lokelma stop freq K checks AMS with myoclonic jerking - likely combination of gabapentin toxicity in the setting of ARF.  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 Benay Spice. Recent history of Serratia bacteremia - will repeat urine and blood  cultures.  He did finish levoquin therapy after he left AMA 3 weeks ago.  HTN - stable CTM Hypercalcemia - resolved  Rexene Agent, MD  [facility]

## 2021-12-30 NOTE — Progress Notes (Signed)
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 Presented today with 4-day history of worsening confusion, intermittent shaking and jerking of his extremities.  Has generalized weakness and intermittently has dizziness. On admission, patient was found to have potassium of > 7.5, bicarb 17, creatinine 14.4.  Patient was admitted for further work-up   Due to persistent hyperkalemia, underwent emergent HD on 2/25 overnight.       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  No urinary stone disease is evident. -Foley catheter placed -Management per nephrology, currently nonoliguric, -Continue with Foley catheter -Gabapentin due to AKI   Essential hypertension - Continue metoprolol 25 mg twice daily.  Given Cardizem 10 mg IV x1 for sinus tachycardia.   Seizure [facility]) -Patient had MRI on 12/05/2021, showed no cerebral metastatic disease or acute intracranial abnormality  -EEG on 12/04/2021 was within normal limits.  No seizures or epileptiform discharges were seen -Continue Keppra.  -Myoclonic jerking also improved after HD.(Is most likely related to gabapentin/baclofen) with AKI.    Hyponatremia Mild.  Sodium 132 at the time of admission -Improved   Acute metabolic encephalopathy- (present on admission) -Encephalopathy with myoclonic jerking likely multifactorial secondary to underlying infection, gabapentin toxicity and uremia.  CT head negative for any stroke.  History of seizures -Mental status much improved after HD, more alert and awake, mother at the bedside -Myoclonic jerking also improved, this is due to gabapentin/baclofen with worsening renal function. -Currently on Keppra     Hyperkalemia- (present on admission) - Potassium 7.2, creatinine 14.0.  Potassium was 7.5 at the time of admission, was treated with [facility]. -However potassium remains persistently elevated, nontunneled central venous catheter was placed by CCM on 2/25 -Patient received emergent HD.     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  -Chemotherapy currently on hold since November, 2022 due to multiple complications, hospitalization at [facility] in 10/2021  Chronic pain syndrome -Due to cancer -Patient on oxycodone 20 mg 3 times daily as needed, I will change to 10 mg p.o. every 4 hours.  As needed.    Pressure Injury Documentation: Pressure Injury 12/28/21 Buttocks Right Stage 2 -  Partial thickness loss of dermis presenting as a shallow open injury with a red, pink wound bed without slough. (Active)  12/28/21 1532  Location: Buttocks  Location Orientation: Right  Staging: Stage 2 -  Partial thickness loss of dermis presenting as a shallow open injury with a red, pink wound bed without slough.  Wound Description (Comments):   Present on Admission: Yes         DVT prophylaxis: Heparin Code Status: Full Family Communication: mother  at [place]ide Disposition:   Status is: Inpatient Remains inpatient appropriate because: AKI, hyperkalemia           Consultants:   Renal   Subjective:  Complaints of rectal pain, otherwise no nausea, vomiting, diarrhea.  Objective: Vitals:   12/30/21 0000 12/30/21 0400 12/30/21 0836 12/30/21 1218  BP: 133/78  (!) 150/98 (!) 151/96  Pulse:   (!) 105 79  Resp:  18 18 18   Temp: 98.6 F (37 C)  98.1 F (36.7 C) 98 F (36.7 C)  TempSrc: Oral  Oral Oral  SpO2:   94% 96%  Weight:        Intake/Output Summary (Last 24 hours) at 12/30/2021 1223 Last data filed at 12/30/2021 0300 Gross per 24 hour  Intake 798.62 ml  Output --  Net 798.62 ml   Filed Weights   12/28/21 2309  Weight: 78 kg    Examination:  Awake Alert, Oriented X 3, No new F.N deficits, Normal affect Symmetrical Chest wall movement, Good air movement bilaterally, CTAB RRR,No Gallops,Rubs or new Murmurs, No Parasternal Heave +ve B.Sounds, Abd Soft, No tenderness, No rebound - guarding or rigidity. No Cyanosis, Clubbing or edema, No new Rash or bruise      Data Reviewed: I have personally reviewed following labs and imaging studies  CBC: Recent Labs  Lab 12/27/21 1857 12/28/21 0419 12/29/21 1233 12/30/21 0400  WBC 9.1 10.1 10.7* 11.9*  NEUTROABS 6.4  --   --   --   HGB 10.2* 11.3* 9.7* 9.3*  HCT 31.2* 35.1* 29.5* 28.4*  MCV 83.2 83.2 82.2 82.3  PLT 439* 473* 321 185    Basic Metabolic Panel: Recent Labs  Lab 12/28/21 0420 12/28/21 1501 12/28/21 1754 12/29/21 0000 12/29/21 0500 12/29/21 0530 12/29/21 1233 12/29/21 2010 12/30/21 0015 12/30/21 0400 12/30/21 0803  NA 138   137 139 137  --  139  --   --   --   --  139  --   K 7.2*   7.2* >7.5* >7.5*   < > 4.8   < > 5.3* 4.2 4.4 3.8 3.6  CL 102   102 104 102  --  100  --   --   --   --  101  --   CO2 17*   19* 19* 20*  --  25  --   --   --   --  28  --   GLUCOSE 70   73 86 119*  --  100*  --   --   --   --  114*  --   BUN 90*   89* 87* 88*  --  26*  --   --   --   --  27*  --   CREATININE 14.03*   13.47* 12.71* 12.31*  --  5.35*  --   --   --   --  5.29*  --   CALCIUM  10.5*   10.4* 10.3 9.8  --  8.9  --   --   --   --  8.7*  --   PHOS 6.8*  --   --   --  4.5  --   --   --   --  4.0  --    < > = values in this interval not displayed.    GFR: Estimated Creatinine Clearance: 15.3 mL/min (A) (by C-G formula based on SCr of 5.29 mg/dL (H)).  Liver Function Tests: Recent Labs  Lab 12/27/21 1857  12/28/21 0420 12/29/21 0500 12/30/21 0400  AST 17  --   --   --   ALT 10  --   --   --   ALKPHOS 82  --   --   --   BILITOT 0.5  --   --   --   PROT 8.0  --   --   --   ALBUMIN 3.2* 3.3* 2.5* 2.4*    CBG: Recent Labs  Lab 12/29/21 2034  GLUCAP 136*     Recent Results (from the past 240 hour(s))  Blood culture (routine x 2)     Status: None (Preliminary result)   Collection Time: 12/27/21  6:38 PM   Specimen: BLOOD  Result Value Ref Range Status   Specimen Description BLOOD RIGHT ANTECUBITAL  Final   Special Requests   Final    BOTTLES DRAWN AEROBIC AND ANAEROBIC Blood Culture adequate volume   Culture   Final    NO GROWTH 3 DAYS Performed at [facility], [place] [address] [place], [place] [address]    Report Status PENDING  Incomplete  Blood culture (routine x 2)     Status: None (Preliminary result)   Collection Time: 12/27/21  6:58 PM   Specimen: BLOOD  Result Value Ref Range Status   Specimen Description BLOOD SITE NOT SPECIFIED  Final   Special Requests   Final    BOTTLES DRAWN AEROBIC AND ANAEROBIC Blood Culture adequate volume   Culture   Final    NO GROWTH 3 DAYS Performed at [facility], [address], [place], [place] [address]    Report Status PENDING  Incomplete  Resp Panel by RT-PCR (Flu A&B, Covid) Nasopharyngeal Swab     Status: None   Collection Time: 12/27/21  9:50 PM   Specimen: Nasopharyngeal Swab; Nasopharyngeal(NP) swabs in vial transport medium  Result Value Ref Range Status   SARS Coronavirus 2 by RT PCR NEGATIVE NEGATIVE Final    Comment: (NOTE) SARS-CoV-2 target nucleic acids are NOT  DETECTED.  The SARS-CoV-2 RNA is generally detectable in upper respiratory specimens during the acute phase of infection. The lowest concentration of SARS-CoV-2 viral copies this assay can detect is 138 copies/mL. A negative result does not preclude SARS-Cov-2 infection and should not be used as the sole basis for treatment or other patient management decisions. A negative result may occur with  improper specimen collection/handling, submission of specimen other than nasopharyngeal swab, presence of viral mutation(s) within the areas targeted by this assay, and inadequate number of viral copies(<138 copies/mL). A negative result must be combined with clinical observations, patient history, and epidemiological information. The expected result is Negative.  Fact Sheet for Patients:  EntrepreneurPulse.com.au  Fact Sheet for Healthcare Providers:  IncredibleEmployment.be  This test is no t yet approved or cleared by the [place] FDA and  has been authorized for detection and/or diagnosis of SARS-CoV-2 by FDA under an Emergency Use Authorization (EUA). This EUA will remain  in effect (meaning this test can be used) for the duration of the COVID-19 declaration under Section 564(b)(1) of the Act, 21 U.S.C.section 360bbb-3(b)(1), unless the authorization is terminated  or revoked sooner.       Influenza A by PCR NEGATIVE NEGATIVE Final   Influenza B by PCR NEGATIVE NEGATIVE Final    Comment: (NOTE) The Xpert Xpress SARS-CoV-2/FLU/RSV plus assay is intended as an aid in the diagnosis of influenza from Nasopharyngeal swab specimens and should not be used as a  sole basis for treatment. Nasal washings and aspirates are unacceptable for Xpert Xpress SARS-CoV-2/FLU/RSV testing.  Fact Sheet for Patients: EntrepreneurPulse.com.au  Fact Sheet for Healthcare Providers: IncredibleEmployment.be  This test is not yet  approved or cleared by the [place] FDA and has been authorized for detection and/or diagnosis of SARS-CoV-2 by FDA under an Emergency Use Authorization (EUA). This EUA will remain in effect (meaning this test can be used) for the duration of the COVID-19 declaration under Section 564(b)(1) of the Act, 21 U.S.C. section 360bbb-3(b)(1), unless the authorization is terminated or revoked.  Performed at [facility], [place] [address] [place], [place] [address]   Urine Culture     Status: None   Collection Time: 12/27/21 10:24 PM   Specimen: Urine, Catheterized  Result Value Ref Range Status   Specimen Description URINE, CATHETERIZED  Final   Special Requests NONE  Final   Culture   Final    NO GROWTH Performed at [facility], [address] [place], [place] [address]    Report Status 12/29/2021 FINAL  Final         Radiology Studies: DG Chest 1 View  Result Date: 12/28/2021 CLINICAL DATA:  Central line placement EXAM: CHEST  1 VIEW COMPARISON:  12/03/2021 FINDINGS: Right Port-A-Cath remains in place with the tip in the upper right atrium. Interval placement of right internal jugular central line with the tip in the SVC. No pneumothorax. Numerous bilateral nodules and masses compatible with metastatic disease, unchanged. Heart is normal size. No effusions. IMPRESSION: Right internal jugular central line tip in the SVC. No pneumothorax. Numerous bilateral pulmonary nodules and masses compatible with metastatic disease, unchanged. Electronically Signed   By: Rolm Baptise M.D.   On: 12/28/2021 22:29        Scheduled Meds:  Chlorhexidine Gluconate Cloth  6 each Topical Q0600   feeding supplement  237 mL Oral BID   heparin  5,000 Units Subcutaneous Q8H   LORazepam  1-2 mg Intravenous Once in dialysis   metoprolol tartrate  25 mg Oral BID   sodium chloride flush  10-40 mL Intracatheter Q12H   sodium chloride flush  3 mL Intravenous Q12H   tamsulosin  0.4 mg Oral  Daily   Continuous Infusions:  sodium chloride     levETIRAcetam 500 mg (12/30/21 0940)   sodium bicarbonate in 0.45 NS mL infusion 75 mL/hr at 12/30/21 0204     LOS: 3 days       Phillips Climes, MD [facility]   To contact the attending provider between 7A-7P or the covering provider during after hours 7P-7A, please log into the web site [email or web address] and access using universal [place] password for that web site. If you do not have the password, please call the hospital operator.  12/30/2021, 12:23 PM

## 2021-12-31 DIAGNOSIS — G9341 Metabolic encephalopathy: Secondary | ICD-10-CM | POA: Diagnosis not present

## 2021-12-31 DIAGNOSIS — I10 Essential (primary) hypertension: Secondary | ICD-10-CM | POA: Diagnosis not present

## 2021-12-31 DIAGNOSIS — N170 Acute kidney failure with tubular necrosis: Secondary | ICD-10-CM | POA: Diagnosis not present

## 2021-12-31 DIAGNOSIS — E875 Hyperkalemia: Secondary | ICD-10-CM | POA: Diagnosis not present

## 2021-12-31 LAB — RENAL FUNCTION PANEL
Albumin: 2.6 g/dL — ABNORMAL LOW (ref 3.5–5.0)
Anion gap: 10 (ref 5–15)
BUN: 21 mg/dL — ABNORMAL HIGH (ref 6–20)
CO2: 26 mmol/L (ref 22–32)
Calcium: 8.8 mg/dL — ABNORMAL LOW (ref 8.9–10.3)
Chloride: 101 mmol/L (ref 98–111)
Creatinine, Ser: 4.51 mg/dL — ABNORMAL HIGH (ref 0.61–1.24)
GFR, Estimated: 15 mL/min — ABNORMAL LOW (ref >60)
Glucose, Bld: 96 mg/dL (ref 70–99)
Phosphorus: 3.3 mg/dL (ref 2.5–4.6)
Potassium: 3.8 mmol/L (ref 3.5–5.1)
Sodium: 137 mmol/L (ref 135–145)

## 2021-12-31 MED ORDER — SENNOSIDES-DOCUSATE SODIUM 8.6-50 MG PO TABS
2.0000 | ORAL_TABLET | Freq: Two times a day (BID) | ORAL | Status: DC
  Administered 2021-12-31 – 2022-01-01 (×3): 2 via ORAL
  Filled 2021-12-31 (×3): qty 2

## 2021-12-31 MED ORDER — POLYETHYLENE GLYCOL 3350 17 G PO PACK
34.0000 g | PACK | Freq: Once | ORAL | Status: AC
  Administered 2021-12-31: 34 g via ORAL
  Filled 2021-12-31: qty 2

## 2021-12-31 MED ORDER — TAMSULOSIN HCL 0.4 MG PO CAPS
0.8000 mg | ORAL_CAPSULE | Freq: Every day | ORAL | Status: DC
  Administered 2021-12-31 – 2022-01-01 (×2): 0.8 mg via ORAL
  Filled 2021-12-31 (×2): qty 2

## 2021-12-31 NOTE — Plan of Care (Signed)
°  Problem: Education: Goal: Knowledge of General Education information will improve Description: Including pain rating scale, medication(s)/side effects and non-pharmacologic comfort measures Outcome: Progressing   Problem: Health Behavior/Discharge Planning: Goal: Ability to manage health-related needs will improve Outcome: Progressing   Problem: Clinical Measurements: Goal: Ability to maintain clinical measurements within normal limits will improve Outcome: Progressing Goal: Will remain free from infection Outcome: Progressing Goal: Diagnostic test results will improve Outcome: Progressing Goal: Respiratory complications will improve Outcome: Progressing Goal: Cardiovascular complication will be avoided Outcome: Progressing   Problem: Nutrition: Goal: Adequate nutrition will be maintained Outcome: Progressing   Problem: Activity: Goal: Risk for activity intolerance will decrease Outcome: Progressing   Problem: Elimination: Goal: Will not experience complications related to bowel motility Outcome: Progressing Goal: Will not experience complications related to urinary retention Outcome: Progressing   Problem: Coping: Goal: Level of anxiety will decrease Outcome: Progressing

## 2021-12-31 NOTE — Progress Notes (Signed)
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 Presented today with 4-day history of worsening confusion, intermittent shaking and jerking of his extremities.  Has generalized weakness and intermittently has dizziness. On admission, patient was found to have potassium of > 7.5, bicarb 17, creatinine 14.4.  Patient was admitted for further work-up - Due to persistent hyperkalemia, underwent emergent HD on 2/25 overnight.       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 -After Foley catheter insertion, please see discussion below under urinary retention -Management per renal, continue to hold baclofen and gabapentin, -So far no indication for hemodialysis.  Hyperkalemia- (present on admission) - Potassium 7.2, creatinine 14.0.  Potassium was 7.5 at the time of admission, was treated with [facility] -However potassium remains persistently elevated, nontunneled central venous catheter was placed by CCM on 2/25 -Patient received  emergent HD 2/25.  Urinary retention -Patient with recent retention, following with [facility], supposed to have studies in the near future, he supposed to be doing in and out cath.  Patient reports he is compliant with it. -Continue with Foley catheter for now, and will DC before discharge where he can continue his in and out cath -He was instructed to keep his follow-up with [facility] as an outpatient.   Essential hypertension - Continue metoprolol 25 mg twice daily.  Given Cardizem 10 mg IV x1 for sinus tachycardia.   Seizure [facility]) -Patient had MRI on 12/05/2021, showed no cerebral metastatic disease or acute intracranial abnormality  -EEG on 12/04/2021 was within normal limits.  No seizures or epileptiform discharges were seen -Continue Keppra.  -Myoclonic jerking also improved after HD.(Is most likely related to gabapentin/baclofen) with AKI.   Hyponatremia Mild.  Sodium 132 at the time of admission -Improved   Acute metabolic encephalopathy- (present on admission) -Encephalopathy with myoclonic jerking likely multifactorial secondary to underlying infection, gabapentin toxicity and uremia.  CT head negative for any stroke.  History of seizures -Mental status much improved after HD, more alert and awake, mother at the bedside -Myoclonic jerking also improved, this is due to gabapentin/baclofen with worsening renal function. 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  -Chemotherapy currently on hold since November, 2022 due to multiple complications, hospitalization at [facility] in 10/2021  Chronic pain syndrome -Due to cancer -Patient on oxycodone 20 mg 3 times daily as needed, did change it  to 10 mg p.o. every 4 hours.  As needed.    Pressure  Injury Documentation: Pressure Injury 12/28/21 Buttocks Right Stage 2 -  Partial thickness loss of  dermis presenting as a shallow open injury with a red, pink wound bed without slough. (Active)  12/28/21 1532  Location: Buttocks  Location Orientation: Right  Staging: Stage 2 -  Partial thickness loss of dermis presenting as a shallow open injury with a red, pink wound bed without slough.  Wound Description (Comments):   Present on Admission: Yes         DVT prophylaxis: Heparin Code Status: Full Family Communication: Not at bedside Disposition:   Status is: Inpatient Remains inpatient appropriate because: AKI, hyperkalemia           Consultants:  Renal   Subjective:  Patient reports is feeling better today, no nausea, no vomiting, he reports constipation, pain is controlled.  Objective: Vitals:   12/30/21 1702 12/30/21 2154 12/31/21 0000 12/31/21 0819  BP: (!) 156/94 (!) 151/102 (!) 144/95 (!) 150/107  Pulse: 100 100 92 97  Resp: 19 17  19   Temp: 98.3 F (36.8 C) 99.1 F (37.3 C)  98.2 F (36.8 C)  TempSrc: Oral Oral  Oral  SpO2:  97% 95% 96%  Weight:        Intake/Output Summary (Last 24 hours) at 12/31/2021 1143 Last data filed at 12/31/2021 0537 Gross per 24 hour  Intake 576.11 ml  Output 3675 ml  Net -3098.89 ml   Filed Weights   12/28/21 2309  Weight: 78 kg    Examination:  Awake Alert, Oriented X 3, No new F.N deficits, Normal affect Symmetrical Chest wall movement, Good air movement bilaterally, CTAB RRR,No Gallops,Rubs or new Murmurs, No Parasternal Heave +ve B.Sounds, Abd Soft, No tenderness, No rebound - guarding or rigidity. No Cyanosis, Clubbing or edema, No new Rash or bruise      Data Reviewed: I have personally reviewed following labs and imaging studies  CBC: Recent Labs  Lab 12/27/21 1857 12/28/21 0419 12/29/21 1233 12/30/21 0400  WBC 9.1 10.1 10.7* 11.9*  NEUTROABS 6.4  --   --   --   HGB 10.2* 11.3* 9.7* 9.3*  HCT 31.2* 35.1* 29.5* 28.4*  MCV 83.2 83.2 82.2 82.3  PLT 439* 473* 321 174    Basic Metabolic  Panel: Recent Labs  Lab 12/28/21 0420 12/28/21 1501 12/28/21 1754 12/29/21 0000 12/29/21 0500 12/29/21 0530 12/29/21 2010 12/30/21 0015 12/30/21 0400 12/30/21 0803 12/31/21 0341  NA 138   137 139 137  --  139  --   --   --  139  --  137  K 7.2*   7.2* >7.5* >7.5*   < > 4.8   < > 4.2 4.4 3.8 3.6 3.8  CL 102   102 104 102  --  100  --   --   --  101  --  101  CO2 17*   19* 19* 20*  --  25  --   --   --  28  --  26  GLUCOSE 70   73 86 119*  --  100*  --   --   --  114*  --  96  BUN 90*   89* 87* 88*  --  26*  --   --   --  27*  --  21*  CREATININE 14.03*   13.47* 12.71* 12.31*  --  5.35*  --   --   --  5.29*  --  4.51*  CALCIUM 10.5*   10.4* 10.3 9.8  --  8.9  --   --   --  8.7*  --  8.8*  PHOS 6.8*  --   --   --  4.5  --   --   --  4.0  --  3.3   < > = values in this interval not displayed.    GFR: Estimated Creatinine Clearance: 17.9 mL/min (A) (by C-G formula based on SCr of 4.51 mg/dL (H)).  Liver Function Tests: Recent Labs  Lab 12/27/21 1857 12/28/21 0420 12/29/21 0500 12/30/21 0400 12/31/21 0341  AST 17  --   --   --   --   ALT 10  --   --   --   --   ALKPHOS 82  --   --   --   --   BILITOT 0.5  --   --   --   --   PROT 8.0  --   --   --   --   ALBUMIN 3.2* 3.3* 2.5* 2.4* 2.6*    CBG: Recent Labs  Lab 12/29/21 2034  GLUCAP 136*     Recent Results (from the past 240 hour(s))  Blood culture (routine x 2)     Status: None (Preliminary result)   Collection Time: 12/27/21  6:38 PM   Specimen: BLOOD  Result Value Ref Range Status   Specimen Description BLOOD RIGHT ANTECUBITAL  Final   Special Requests   Final    BOTTLES DRAWN AEROBIC AND ANAEROBIC Blood Culture adequate volume   Culture   Final    NO GROWTH 4 DAYS Performed at [facility], [place] [address], [place], [place] [address]    Report Status PENDING  Incomplete  Blood culture (routine x 2)     Status: None (Preliminary result)   Collection Time: 12/27/21  6:58 PM   Specimen: BLOOD   Result Value Ref Range Status   Specimen Description BLOOD SITE NOT SPECIFIED  Final   Special Requests   Final    BOTTLES DRAWN AEROBIC AND ANAEROBIC Blood Culture adequate volume   Culture   Final    NO GROWTH 4 DAYS Performed at [facility], [address], [place], [place] [address]    Report Status PENDING  Incomplete  Resp Panel by RT-PCR (Flu A&B, Covid) Nasopharyngeal Swab     Status: None   Collection Time: 12/27/21  9:50 PM   Specimen: Nasopharyngeal Swab; Nasopharyngeal(NP) swabs in vial transport medium  Result Value Ref Range Status   SARS Coronavirus 2 by RT PCR NEGATIVE NEGATIVE Final    Comment: (NOTE) SARS-CoV-2 target nucleic acids are NOT DETECTED.  The SARS-CoV-2 RNA is generally detectable in upper respiratory specimens during the acute phase of infection. The lowest concentration of SARS-CoV-2 viral copies this assay can detect is 138 copies/mL. A negative result does not preclude SARS-Cov-2 infection and should not be used as the sole basis for treatment or other patient management decisions. A negative result may occur with  improper specimen collection/handling, submission of specimen other than nasopharyngeal swab, presence of viral mutation(s) within the areas targeted by this assay, and inadequate number of viral copies(<138 copies/mL). A negative result must be combined with clinical observations, patient history, and epidemiological information. The expected result is Negative.  Fact Sheet for Patients:  EntrepreneurPulse.com.au  Fact Sheet for Healthcare Providers:  IncredibleEmployment.be  This test is no t yet approved or cleared by the [place] FDA and  has been authorized for detection and/or diagnosis of  SARS-CoV-2 by FDA under an Emergency Use Authorization (EUA). This EUA will remain  in effect (meaning this test can be used) for the duration of the COVID-19 declaration under Section  564(b)(1) of the Act, 21 U.S.C.section 360bbb-3(b)(1), unless the authorization is terminated  or revoked sooner.       Influenza A by PCR NEGATIVE NEGATIVE Final   Influenza B by PCR NEGATIVE NEGATIVE Final    Comment: (NOTE) The Xpert Xpress SARS-CoV-2/FLU/RSV plus assay is intended as an aid in the diagnosis of influenza from Nasopharyngeal swab specimens and should not be used as a sole basis for treatment. Nasal washings and aspirates are unacceptable for Xpert Xpress SARS-CoV-2/FLU/RSV testing.  Fact Sheet for Patients: EntrepreneurPulse.com.au  Fact Sheet for Healthcare Providers: IncredibleEmployment.be  This test is not yet approved or cleared by the [place] FDA and has been authorized for detection and/or diagnosis of SARS-CoV-2 by FDA under an Emergency Use Authorization (EUA). This EUA will remain in effect (meaning this test can be used) for the duration of the COVID-19 declaration under Section 564(b)(1) of the Act, 21 U.S.C. section 360bbb-3(b)(1), unless the authorization is terminated or revoked.  Performed at [facility], [place] [address] [place], [place] [address]   Urine Culture     Status: None   Collection Time: 12/27/21 10:24 PM   Specimen: Urine, Catheterized  Result Value Ref Range Status   Specimen Description URINE, CATHETERIZED  Final   Special Requests NONE  Final   Culture   Final    NO GROWTH Performed at [facility], [address], [place], [place] [address]    Report Status 12/29/2021 FINAL  Final         Radiology Studies: No results found.      Scheduled Meds:  Chlorhexidine Gluconate Cloth  6 each Topical Q0600   feeding supplement  237 mL Oral BID   heparin  5,000 Units Subcutaneous Q8H   LORazepam  1-2 mg Intravenous Once in dialysis   metoprolol tartrate  25 mg Oral BID   sodium chloride flush  10-40 mL Intracatheter Q12H   sodium chloride flush  3 mL  Intravenous Q12H   tamsulosin  0.8 mg Oral Daily   Continuous Infusions:  sodium chloride     levETIRAcetam Stopped (12/31/21 1040)     LOS: 4 days       Phillips Climes, MD [facility]   To contact the attending provider between 7A-7P or the covering provider during after hours 7P-7A, please log into the web site [email or web address] and access using universal  password for that web site. If you do not have the password, please call the hospital operator.  12/31/2021, 11:43 AM

## 2021-12-31 NOTE — Progress Notes (Signed)
Temporary right IJ HDC removed per protocol per MD order. Manual pressure applied for 10 mins. Vaseline gauze, gauze, and Tegaderm applied over insertion site. No bleeding or swelling noted. Instructed patient to remain in bed for thirty mins. Educated patient about S/S of infection and when to call MD; no heavy lifting or pressure on right side for 24 hours; keep dressing dry and intact for 24 hours. Pt verbalized comprehension.

## 2021-12-31 NOTE — Progress Notes (Signed)
Patient ID: Bradley Colon, male   DOB: 06/28/1971, 51 y.o.   MRN: [id number] S:  SCr down to 4.5, K 3.8, 3.5L UOP Still has R IJ Temp HD cath NO c/o from patient  O:BP (!) 158/93 (BP Location: Right Arm)    Pulse 85    Temp 98.3 F (36.8 C) (Oral)    Resp 20    Wt 78 kg    SpO2 97%    BMI 26.93 kg/m   Intake/Output Summary (Last 24 hours) at 12/31/2021 1301 Last data filed at 12/31/2021 0537 Gross per 24 hour  Intake 576.11 ml  Output 3675 ml  Net -3098.89 ml    Intake/Output: I/O last 3 completed shifts: In: 1052.3 [P.O.:240; I.V.:476.1; IV Piggyback:336.1] Out: 3675 [Urine:3675]  Intake/Output this shift:  No intake/output data recorded. Weight change:   Gen: NAD, lying in bed CVS:RRR, no rub Resp:CTA Abd: +BS, soft, NT/ND Ext: no edema  Recent Labs  Lab 12/27/21 1857 12/28/21 0420 12/28/21 1501 12/28/21 1754 12/29/21 0000 12/29/21 0500 12/29/21 0530 12/29/21 1233 12/29/21 2010 12/30/21 0015 12/30/21 0400 12/30/21 0803 12/31/21 0341  NA 132* 138   137 139 137  --  139  --   --   --   --  139  --  137  K >7.5* 7.2*   7.2* >7.5* >7.5*   < > 4.8 4.8 5.3* 4.2 4.4 3.8 3.6 3.8  CL 98 102   102 104 102  --  100  --   --   --   --  101  --  101  CO2 17* 17*   19* 19* 20*  --  25  --   --   --   --  28  --  26  GLUCOSE 94 70   73 86 119*  --  100*  --   --   --   --  114*  --  96  BUN 90* 90*   89* 87* 88*  --  26*  --   --   --   --  27*  --  21*  CREATININE 14.40* 14.03*   13.47* 12.71* 12.31*  --  5.35*  --   --   --   --  5.29*  --  4.51*  ALBUMIN 3.2* 3.3*  --   --   --  2.5*  --   --   --   --  2.4*  --  2.6*  CALCIUM 10.1 10.5*   10.4* 10.3 9.8  --  8.9  --   --   --   --  8.7*  --  8.8*  PHOS  --  6.8*  --   --   --  4.5  --   --   --   --  4.0  --  3.3  AST 17  --   --   --   --   --   --   --   --   --   --   --   --   ALT 10  --   --   --   --   --   --   --   --   --   --   --   --    < > = values in this interval not displayed.    Liver Function  Tests: Recent Labs  Lab 12/27/21 1857 12/28/21 0420 12/29/21 0500 12/30/21 0400 12/31/21 0341  AST 17  --   --   --   --  ALT 10  --   --   --   --   ALKPHOS 82  --   --   --   --   BILITOT 0.5  --   --   --   --   PROT 8.0  --   --   --   --   ALBUMIN 3.2*   < > 2.5* 2.4* 2.6*   < > = values in this interval not displayed.    No results for input(s): LIPASE, AMYLASE in the last 168 hours. No results for input(s): AMMONIA in the last 168 hours. CBC: Recent Labs  Lab 12/27/21 1857 12/28/21 0419 12/29/21 1233 12/30/21 0400  WBC 9.1 10.1 10.7* 11.9*  NEUTROABS 6.4  --   --   --   HGB 10.2* 11.3* 9.7* 9.3*  HCT 31.2* 35.1* 29.5* 28.4*  MCV 83.2 83.2 82.2 82.3  PLT 439* 473* 321 295    Cardiac Enzymes: No results for input(s): CKTOTAL, CKMB, CKMBINDEX, TROPONINI in the last 168 hours. CBG: Recent Labs  Lab 12/29/21 2034  GLUCAP 136*     Iron Studies: No results for input(s): IRON, TIBC, TRANSFERRIN, FERRITIN in the last 72 hours. Studies/Results: No results found.  Chlorhexidine Gluconate Cloth  6 each Topical Q0600   feeding supplement  237 mL Oral BID   heparin  5,000 Units Subcutaneous Q8H   LORazepam  1-2 mg Intravenous Once in dialysis   metoprolol tartrate  25 mg Oral BID   polyethylene glycol  34 g Oral Once   senna-docusate  2 tablet Oral BID   sodium chloride flush  10-40 mL Intracatheter Q12H   sodium chloride flush  3 mL Intravenous Q12H   tamsulosin  0.8 mg Oral Daily    BMET    Component Value Date/Time   NA 137 12/31/2021 0341   NA 143 04/04/2021 1039   K 3.8 12/31/2021 0341   CL 101 12/31/2021 0341   CO2 26 12/31/2021 0341   GLUCOSE 96 12/31/2021 0341   BUN 21 (H) 12/31/2021 0341   BUN 15 04/04/2021 1039   CREATININE 4.51 (H) 12/31/2021 0341   CREATININE 0.81 10/07/2021 1335   CREATININE 1.10 07/22/2016 1540   CALCIUM 8.8 (L) 12/31/2021 0341   GFRNONAA 15 (L) 12/31/2021 0341   GFRNONAA >60 10/07/2021 1335   GFRNONAA 80 07/22/2016  1540   GFRAA >60 01/02/2020 2046   GFRAA >89 07/22/2016 1540   CBC    Component Value Date/Time   WBC 11.9 (H) 12/30/2021 0400   RBC 3.45 (L) 12/30/2021 0400   HGB 9.3 (L) 12/30/2021 0400   HGB 11.4 (L) 10/07/2021 1335   HCT 28.4 (L) 12/30/2021 0400   PLT 295 12/30/2021 0400   PLT 477 (H) 10/07/2021 1335   MCV 82.3 12/30/2021 0400   MCH 27.0 12/30/2021 0400   MCHC 32.7 12/30/2021 0400   RDW 15.3 12/30/2021 0400   LYMPHSABS 1.5 12/27/2021 1857   MONOABS 1.0 12/27/2021 1857   EOSABS 0.2 12/27/2021 1857   BASOSABS 0.1 12/27/2021 1857    Assessment/Plan:  ARF, non-oliguric - recurrent and presumably due to obstructive uropathy and likely pre-renal injury as well.  Real [place] with bilateral hydronephrosis.  Has improved with placement of foley catheter. Cont Foley, will need urology input for self I&O cath mgmt upon discharge Held baclofen and gabapentin in setting of ARF S/p emergent HD over the weekend for persistent hyperkalemia. No further RRT needs Removed HD cath today If stable tomorrow ok for  DC and close fu Hyperkalemia - due to #1. Resolved S/p urgent HD on 12/28/21 for persistent hyperkalemia despite good UOP and improving BUN/Cr Hold Lokelma stop freq K checks AMS with myoclonic jerking - likely combination of gabapentin toxicity in the setting of ARF.  Resolved, suspect HD and inc GFR cleared gabapentin/baclofen Hyponatremia - resolved Anemia - presumably due to malignancy and ongoing chemo Metastatic adenocarcinoma of the rectum - needs to follow up with Dr. Benay Spice. Recent history of Serratia bacteremia - will repeat urine and blood cultures.  He did finish levoquin therapy after he left [facility] 3 weeks ago.  HTN - stable CTM Hypercalcemia - resolved  Rexene Agent, MD  [facility]

## 2022-01-01 ENCOUNTER — Other Ambulatory Visit (HOSPITAL_COMMUNITY): Payer: Self-pay

## 2022-01-01 DIAGNOSIS — N170 Acute kidney failure with tubular necrosis: Secondary | ICD-10-CM | POA: Diagnosis not present

## 2022-01-01 DIAGNOSIS — G9341 Metabolic encephalopathy: Secondary | ICD-10-CM | POA: Diagnosis not present

## 2022-01-01 DIAGNOSIS — E875 Hyperkalemia: Secondary | ICD-10-CM | POA: Diagnosis not present

## 2022-01-01 DIAGNOSIS — I10 Essential (primary) hypertension: Secondary | ICD-10-CM | POA: Diagnosis not present

## 2022-01-01 LAB — RENAL FUNCTION PANEL
Albumin: 2.5 g/dL — ABNORMAL LOW (ref 3.5–5.0)
Anion gap: 9 (ref 5–15)
BUN: 20 mg/dL (ref 6–20)
CO2: 24 mmol/L (ref 22–32)
Calcium: 8.6 mg/dL — ABNORMAL LOW (ref 8.9–10.3)
Chloride: 102 mmol/L (ref 98–111)
Creatinine, Ser: 3.57 mg/dL — ABNORMAL HIGH (ref 0.61–1.24)
GFR, Estimated: 20 mL/min — ABNORMAL LOW (ref >60)
Glucose, Bld: 85 mg/dL (ref 70–99)
Phosphorus: 3.4 mg/dL (ref 2.5–4.6)
Potassium: 3.9 mmol/L (ref 3.5–5.1)
Sodium: 135 mmol/L (ref 135–145)

## 2022-01-01 LAB — CBC
HCT: 25.8 % — ABNORMAL LOW (ref 39.0–52.0)
Hemoglobin: 8.4 g/dL — ABNORMAL LOW (ref 13.0–17.0)
MCH: 27.1 pg (ref 26.0–34.0)
MCHC: 32.6 g/dL (ref 30.0–36.0)
MCV: 83.2 fL (ref 80.0–100.0)
Platelets: 285 10*3/uL (ref 150–400)
RBC: 3.1 MIL/uL — ABNORMAL LOW (ref 4.22–5.81)
RDW: 14.9 % (ref 11.5–15.5)
WBC: 9.5 10*3/uL (ref 4.0–10.5)
nRBC: 0 % (ref 0.0–0.2)

## 2022-01-01 LAB — CULTURE, BLOOD (ROUTINE X 2)
Culture: NO GROWTH
Culture: NO GROWTH
Special Requests: ADEQUATE
Special Requests: ADEQUATE

## 2022-01-01 MED ORDER — METOPROLOL TARTRATE 25 MG PO TABS: 25.0000 mg | ORAL_TABLET | Freq: Two times a day (BID) | ORAL | 2 refills | Status: DC

## 2022-01-01 MED ORDER — LEVETIRACETAM 500 MG PO TABS: 500.0000 mg | ORAL_TABLET | Freq: Two times a day (BID) | ORAL | Status: DC

## 2022-01-01 MED ORDER — ALUM & MAG HYDROXIDE-SIMETH 200-200-20 MG/5ML PO SUSP
30.0000 mL | Freq: Four times a day (QID) | ORAL | Status: DC | PRN
  Administered 2022-01-01: 30 mL via ORAL
  Filled 2022-01-01: qty 30

## 2022-01-01 MED ORDER — HEPARIN SOD (PORK) LOCK FLUSH 100 UNIT/ML IV SOLN
500.0000 [IU] | INTRAVENOUS | Status: AC | PRN
  Administered 2022-01-01: 500 [IU]
  Filled 2022-01-01: qty 5

## 2022-01-01 NOTE — Progress Notes (Signed)
Patient ID: Bradley Colon, male   DOB: [birth year], 51 y.o.   MRN: [id number] ?S:  ?SCr down to 3.5, K 3.9, 3.0L UOP ?Temp HD cath removed ?NO c/o from patient ?For DC today ?Foley to be removed, will return to more freq I&O catheterization ? ?O:BP (!) 154/93 (BP Location: Left Arm)   Pulse 91   Temp 98.5 ?F (36.9 ?C) (Oral)   Resp 19   Wt 78 kg   SpO2 100%   BMI 26.93 kg/m?  ? ?Intake/Output Summary (Last 24 hours) at 01/01/2022 1216 ?Last data filed at 01/01/2022 1125 ?Gross per 24 hour  ?Intake 110 ml  ?Output 3000 ml  ?Net -2890 ml  ? ? ?Intake/Output: ?I/O last 3 completed shifts: ?In: 340 [P.O.:240; IV Piggyback:100] ?Out: 4425 [UUVOZ:3664] ? Intake/Output this shift: ? Total I/O ?In: 10 [I.V.:10] ?Out: -  ?Weight change:  ? ?Gen: NAD, lying in bed ?CVS:RRR, no rub ?Resp:CTA ?Abd: +BS, soft, NT/ND ?Ext: no edema ? ?Recent Labs  ?Lab 12/27/21 ?1857 12/28/21 ?0420 12/28/21 ?1501 12/28/21 ?1754 12/29/21 ?0000 12/29/21 ?0500 12/29/21 ?0530 12/29/21 ?1233 12/29/21 ?2010 12/30/21 ?0015 12/30/21 ?0400 12/30/21 ?0803 12/31/21 ?4034 01/01/22 ?0354  ?NA 132* 138  137 139 137  --  139  --   --   --   --  139  --  137 135  ?K >7.5* 7.2*  7.2* >7.5* >7.5*   < > 4.8   < > 5.3* 4.2 4.4 3.8 3.6 3.8 3.9  ?CL 98 102  102 104 102  --  100  --   --   --   --  101  --  101 102  ?CO2 17* 17*  19* 19* 20*  --  25  --   --   --   --  28  --  26 24  ?GLUCOSE 94 70  73 86 119*  --  100*  --   --   --   --  114*  --  96 85  ?BUN 90* 90*  89* 87* 88*  --  26*  --   --   --   --  27*  --  21* 20  ?CREATININE 14.40* 14.03*  13.47* 12.71* 12.31*  --  5.35*  --   --   --   --  5.29*  --  4.51* 3.57*  ?ALBUMIN 3.2* 3.3*  --   --   --  2.5*  --   --   --   --  2.4*  --  2.6* 2.5*  ?CALCIUM 10.1 10.5*  10.4* 10.3 9.8  --  8.9  --   --   --   --  8.7*  --  8.8* 8.6*  ?PHOS  --  6.8*  --   --   --  4.5  --   --   --   --  4.0  --  3.3 3.4  ?AST 17  --   --   --   --   --   --   --   --   --   --   --   --   --   ?ALT 10  --   --   --   --    --   --   --   --   --   --   --   --   --   ? < > = values in this interval not displayed.  ? ? ?Liver  Function Tests: ?Recent Labs  ?Lab 12/27/21 ?1857 12/28/21 ?0420 12/30/21 ?0400 12/31/21 ?3500 01/01/22 ?0354  ?AST 17  --   --   --   --   ?ALT 10  --   --   --   --   ?ALKPHOS 82  --   --   --   --   ?BILITOT 0.5  --   --   --   --   ?PROT 8.0  --   --   --   --   ?ALBUMIN 3.2*   < > 2.4* 2.6* 2.5*  ? < > = values in this interval not displayed.  ? ? ?No results for input(s): LIPASE, AMYLASE in the last 168 hours. ?No results for input(s): AMMONIA in the last 168 hours. ?CBC: ?Recent Labs  ?Lab 12/27/21 ?1857 12/28/21 ?0419 12/29/21 ?1233 12/30/21 ?0400 01/01/22 ?0354  ?WBC 9.1 10.1 10.7* 11.9* 9.5  ?NEUTROABS 6.4  --   --   --   --   ?HGB 10.2* 11.3* 9.7* 9.3* 8.4*  ?HCT 31.2* 35.1* 29.5* 28.4* 25.8*  ?MCV 83.2 83.2 82.2 82.3 83.2  ?PLT 439* 473* 321 295 285  ? ? ?Cardiac Enzymes: ?No results for input(s): CKTOTAL, CKMB, CKMBINDEX, TROPONINI in the last 168 hours. ?CBG: ?Recent Labs  ?Lab 12/29/21 ?2034  ?GLUCAP 136*  ? ? ? ?Iron Studies: No results for input(s): IRON, TIBC, TRANSFERRIN, FERRITIN in the last 72 hours. ?Studies/Results: ?No results found. ? Chlorhexidine Gluconate Cloth  6 each Topical Q0600  ? feeding supplement  237 mL Oral BID  ? heparin  5,000 Units Subcutaneous Q8H  ? levETIRAcetam  500 mg Oral BID  ? metoprolol tartrate  25 mg Oral BID  ? senna-docusate  2 tablet Oral BID  ? sodium chloride flush  10-40 mL Intracatheter Q12H  ? sodium chloride flush  3 mL Intravenous Q12H  ? tamsulosin  0.8 mg Oral Daily  ? ? ?BMET ?   ?Component Value Date/Time  ? NA 135 01/01/2022 0354  ? NA 143 04/04/2021 1039  ? K 3.9 01/01/2022 0354  ? CL 102 01/01/2022 0354  ? CO2 24 01/01/2022 0354  ? GLUCOSE 85 01/01/2022 0354  ? BUN 20 01/01/2022 0354  ? BUN 15 04/04/2021 1039  ? CREATININE 3.57 (H) 01/01/2022 0354  ? CREATININE 0.81 10/07/2021 1335  ? CREATININE 1.10 07/22/2016 1540  ? CALCIUM 8.6 (L) 01/01/2022  0354  ? GFRNONAA 20 (L) 01/01/2022 0354  ? GFRNONAA >60 10/07/2021 1335  ? GFRNONAA 80 07/22/2016 1540  ? GFRAA >60 01/02/2020 2046  ? GFRAA >89 07/22/2016 1540  ? ?CBC ?   ?Component Value Date/Time  ? WBC 9.5 01/01/2022 0354  ? RBC 3.10 (L) 01/01/2022 0354  ? HGB 8.4 (L) 01/01/2022 0354  ? HGB 11.4 (L) 10/07/2021 1335  ? HCT 25.8 (L) 01/01/2022 0354  ? PLT 285 01/01/2022 0354  ? PLT 477 (H) 10/07/2021 1335  ? MCV 83.2 01/01/2022 0354  ? MCH 27.1 01/01/2022 0354  ? MCHC 32.6 01/01/2022 0354  ? RDW 14.9 01/01/2022 0354  ? LYMPHSABS 1.5 12/27/2021 1857  ? MONOABS 1.0 12/27/2021 1857  ? EOSABS 0.2 12/27/2021 1857  ? BASOSABS 0.1 12/27/2021 1857  ? ? ?Assessment/Plan: ? Resolving ARF, non-oliguric - recurrent and presumably due to obstructive uropathy and likely pre-renal injury as well.  Real [place] with bilateral hydronephrosis.  Has improved with placement of foley catheter. ?Will inc freq of I&O cath at DC and has close f/u with  urology ?Held baclofen and gabapentin in setting of ARF ?S/p emergent HD over the weekend for persistent hyperkalemia. No further RRT needs ?OK for DC, will arrange CKA f/u ?Hyperkalemia - due to #1. Resolved ?S/p urgent HD on 12/28/21 for persistent hyperkalemia despite good UOP and improving BUN/Cr ?Resolved ?AMS with myoclonic jerking - likely combination of gabapentin toxicity in the setting of ARF.  Resolved, suspect HD and inc GFR cleared gabapentin/baclofen ?Hyponatremia - resolved ?Anemia - presumably due to malignancy and ongoing chemo, per Onc ?Metastatic adenocarcinoma of the rectum - needs to follow up with Onc. ?Recent history of Serratia bacteremia - will repeat urine and blood cultures.  He did finish levoquin therapy after he left AMA 3 weeks ago.  ?HTN - stable CTM ?Hypercalcemia - resolved ? ?Rexene Agent, MD  ?[facility] ? ?

## 2022-01-01 NOTE — Progress Notes (Signed)
Discharge paperwork reviewed with pt by SWAT RN. Pt verbalized understanding. Pt has gathered all of his belongings. Pt alert and oriented x4 in no acute distress upon discharge. Pt rolled to main entrance via wheelchair by RN. Pt's friend transported home. Pt took his belongings with him.  ?

## 2022-01-01 NOTE — Progress Notes (Signed)
Foley catheter removed. Will wait a few hours so pt can self cath. Will make Dr. Sloan Leiter aware.  ?

## 2022-01-01 NOTE — TOC Transition Note (Signed)
Transition of Care (TOC) - CM/SW Discharge Note ? ? ?Patient Details  ?Name: Bradley Colon ?MRN: [id number] ?Date of Birth: 1971/03/27 ? ?Transition of Care (TOC) CM/SW Contact:  ?Cyndi Bender, RN ?Phone Number: ?01/01/2022, 12:40 PM ? ? ?Clinical Narrative:    ?Patient stable for discharge.  ?Patient already has catheter supplies at home for in and out cath.  ?DME order for walker. Patient requesting walker be delivered to his to [address] [place], [place] [address] ?Freda Munro with [employer] notified.  ?No other TOC needs. ? ? ? ? ?Final next level of care: Home/Self Care ?Barriers to Discharge: Barriers Resolved ? ? ?Patient Goals and CMS Choice ?Patient states their goals for this hospitalization and ongoing recovery are:: return home ?  ?  ? ?Discharge Placement ?  ?           ? home ?  ?  ?  ? ?Discharge Plan and Services ?  ?  ?           ?DME Arranged: Walker ?DME Agency: [employer] ?Date DME Agency Contacted: 01/01/22 ?Time DME Agency Contacted: [id number] ?Representative spoke with at DME Agency: Freda Munro ?  ?  ?  ?  ?  ? ?Social Determinants of Health (SDOH) Interventions ?  ? ? ?Readmission Risk Interventions ?Readmission Risk Prevention Plan 01/01/2022  ?Transportation Screening Complete  ?PCP or Specialist Appt within 3-5 Days Complete  ?[place] or Home Care Consult Complete  ?Palliative Care Screening Not Applicable  ?Medication Review Press photographer) Complete  ?Some recent data might be hidden  ? ? ? ? ? ?

## 2022-01-01 NOTE — Discharge Summary (Signed)
PATIENT DETAILS Name: Bradley Colon Age: 51 y.o. Sex: male Date of Birth: [birth year] MRN: [id number]. Admitting Physician: Eben Burow, MD PCP:Pcp, No  Admit Date: 12/27/2021 Discharge date: 01/01/2022  Recommendations for Outpatient Follow-up:  Follow up with PCP in 1-2 weeks Please obtain CMP/CBC in one week Please follow up with [facility]  Admitted From:  Home  Disposition: Home   Discharge Condition: good  CODE STATUS:   Code Status: Full Code   Diet recommendation:  Diet Order             Diet - low sodium heart healthy           Diet renal with fluid restriction Fluid restriction: 1200 mL Fluid; Room service appropriate? Yes; Fluid consistency: Thin  Diet effective now                    Brief Summary: 51 year old with history of urinary retention-who self catheterizes at home-presented to the ED with 4-day history of confusion, myoclonic jerks-he was found to have hyperkalemia, acute renal failure in the setting of obstructive uropathy.  He was subsequently admitted to the hospitalist service-Foley catheter was inserted-with rapid improvement in his renal function.  Per patient-he unfortunately was not emptying his bladder out properly when he was self catheterizing at home.  See below for further details.  Brief Hospital Course: Acute renal failure: Due to obstructive uropathy-although patient was doing self-catheterization at home-he was not emptying all the way.  Foley catheter was placed-rapid improvement in his renal function-nephrology followed closely.  Discussed with Dr. Maricela Bo further recommendations-okay to discharge.  We will discontinue Foley catheter today-we will ensure that patient is able to self catheterize till bladder is empty-and then he will be discharged home.  [facility] has [facility] appointment with [facility] next week that he has been asked to keep.  Per patient-he has already been educated in regards to how to self  catheterize himself prior to this hospitalization.  Hyperkalemia: Resolved-it did require emergent dialysis.  Acute urinary retention: See above-regarding plans to discontinue Foley catheter-and have patient follow-up with [facility].  Myoclonic jerking: Likely due to renal failure-and accumulation of gabapentin/baclofen in the setting of ARF.  Has resolved.  Acute metabolic encephalopathy: Due to AKI/baclofen/Neurontin-has resolved-patient completely awake and alert.  Recent Serratia bacteremia: This was during his last hospitalization-he signed out AMA-before course of IV cefepime could be completed.  Thankfully his repeat blood cultures on 2/24 is negative.  Briefly discussed case with Dr. Emmit Alexanders MD on call-since blood cultures are negative-we do not think that patient needs any further antimicrobial therapy treatment.  Patient has had seizures in the past-and avoiding use of fluoroquinolones due to risk of lowering seizure threshold inducing breakthrough seizures.  HTN: Continue metoprolol  Chronic pain syndrome: Continue as needed narcotics  History of seizure disorder: This was during his last hospitalization in early February.  No further seizures since then.  EEG negative for seizures-MRI brain did not show any metastatic disease or acute intracranial abnormalities.  Reviewed last neurology consultation note on 2/1-since seizures were felt to be due to metabolic derangements-Keppra was only recommended for 1 week.  Hence will not plan on continuing Keppra on discharge.  Note-discussed with Dr. Madelynn Done MD over the phone.  Metastatic rectal adenocarcinoma: Resume usual outpatient follow-up with oncology.  BMI: Estimated body mass index is 26.93 kg/m as calculated from the following:   Height as of 12/04/21: 5\' 7"  (1.702 m).   Weight as of  this encounter: 78 kg.   RN pressure injury documentation: Pressure Injury 12/28/21 Buttocks Right Stage 2 -  Partial thickness  loss of dermis presenting as a shallow open injury with a red, pink wound bed without slough. (Active)  12/28/21 1532  Location: Buttocks  Location Orientation: Right  Staging: Stage 2 -  Partial thickness loss of dermis presenting as a shallow open injury with a red, pink wound bed without slough.  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)   Hyperkalemia   Acute metabolic encephalopathy   Hyponatremia   Seizure (HCC)   Essential hypertension   Pressure injury of skin   Discharge Instructions:  Activity:  As tolerated  Discharge Instructions     Call MD for:  extreme fatigue   Complete by: As directed    Call MD for:  persistant dizziness or light-headedness   Complete by: As directed    Diet - low sodium heart healthy   Complete by: As directed    Discharge instructions   Complete by: As directed    Follow with Primary MD  Pcp, No in 1-2 weeks  Please get a complete blood count and chemistry panel checked by your Primary MD at your next visit, and again as instructed by your Primary MD.  Get Medicines reviewed and adjusted: Please take all your medications with you for your next visit with your Primary MD  Laboratory/radiological data: Please request your Primary MD to go over all hospital tests and procedure/radiological results at the follow up, please ask your Primary MD to get all Hospital records sent to his/her office.  In some cases, they will be blood work, cultures and biopsy results pending at the time of your discharge. Please request that your primary care M.D. follows up on these results.  Also Note the following: If you experience worsening of your admission symptoms, develop shortness of breath, life threatening emergency, suicidal or homicidal thoughts you must seek medical attention  immediately by calling 911 or calling your MD immediately  if symptoms less severe.  You must read complete instructions/literature along with all the possible adverse reactions/side effects for all the Medicines you take and that have been prescribed to you. Take any new Medicines after you have completely understood and accpet all the possible adverse reactions/side effects.   Do not drive when taking Pain medications or sleeping medications (Benzodaizepines)  Do not take more than prescribed Pain, Sleep and Anxiety Medications. It is not advisable to combine anxiety,sleep and pain medications without talking with your primary care practitioner  Special Instructions: If you have smoked or chewed Tobacco  in the last 2 yrs please stop smoking, stop any regular Alcohol  and or any Recreational drug use.  Wear Seat belts while driving.  Please note: You were cared for by a hospitalist during your hospital stay. Once you are discharged, your primary care physician will handle any further medical issues. Please note that NO REFILLS for any discharge medications will be authorized once you are discharged, as it is imperative that you return to your primary care physician (or establish a relationship with a primary care physician if you do not have one) for your post hospital discharge needs so that they can reassess your need for medications and monitor your lab values.   Continue to self catheterize at home  until bladder is empty.  Please keep your appointment with [facility] next week.   Increase activity slowly   Complete by: As directed    No dressing needed   Complete by: As directed       Allergies as of 01/01/2022       Reactions   Bee Venom Anaphylaxis, Swelling   Pt has been stung by honey bees since he became a vegetarian at age 24 yrs old with no reaction.   Other Shortness Of Breath, Itching   Reaction to cut grass, pet dander   Contrast Media [iodinated Contrast Media] Hives    Shrimp [shellfish Allergy] Swelling, Other (See Comments)   Muscle cramps   Vancomycin Itching   Reaction to IV - pt states no reaction if it is infused slowly   Wheat Bran Swelling   Some swelling   Latex Hives, Rash   Reaction to fumes from latex paint        Medication List     STOP taking these medications    baclofen 10 MG tablet Commonly known as: LIORESAL   diazepam 10 MG tablet Commonly known as: VALIUM   diphenoxylate-atropine 2.5-0.025 MG tablet Commonly known as: LOMOTIL   gabapentin 600 MG tablet Commonly known as: NEURONTIN   Hyoscyamine Sulfate SL 0.125 MG Subl Commonly known as: Levsin/SL   ondansetron 8 MG tablet Commonly known as: ZOFRAN   pantoprazole 40 MG tablet Commonly known as: PROTONIX   phenazopyridine 100 MG tablet Commonly known as: Pyridium       TAKE these medications    Ensure Plant-Based Protein Liqd Take 1 Can by mouth 2 (two) times daily.   metoprolol tartrate 25 MG tablet Commonly known as: LOPRESSOR Take 1 tablet (25 mg total) by mouth 2 (two) times daily.   morphine 60 MG 12 hr tablet Commonly known as: MS CONTIN Take 1 tablet by mouth every 12 hours.   MOVANTIK PO Take 1 tablet by mouth daily as needed (constipation). Unsure of mg   Oxycodone HCl 20 MG Tabs Take 1 tablet by mouth 3 times daily as needed for severe pain.   ProAir HFA 108 (90 Base) MCG/ACT inhaler Generic drug: albuterol INHALE 2 PUFFS INTO THE LUNGS EVERY 6 (SIX) HOURS AS NEEDED FOR WHEEZING OR SHORTNESS OF BREATH.   Rectiv 0.4 % Oint Generic drug: Nitroglycerin Place 1 application rectally daily as needed (fissure pain).   tamsulosin 0.4 MG Caps capsule Commonly known as: FLOMAX Take 2 capsules (0.8 mg total) by mouth daily.               Discharge Care Instructions  (From admission, onward)           Start     Ordered   01/01/22 0000  No dressing needed        01/01/22 1037            Follow-up Information      Primary care practitioner. Schedule an appointment as soon as possible for a visit in 1 week(s).          [facility] Follow [facility].   Why: Keep appointment next week.               Allergies  Allergen Reactions   Bee Venom Anaphylaxis and Swelling    Pt has been stung by honey bees since he became a vegetarian at age 25 yrs old with no reaction.   Other Shortness Of Breath and Itching  Reaction to cut grass, pet dander   Contrast Media [Iodinated Contrast Media] Hives   Shrimp [Shellfish Allergy] Swelling and Other (See Comments)    Muscle cramps   Vancomycin Itching    Reaction to IV - pt states no reaction if it is infused slowly   Wheat Bran Swelling    Some swelling   Latex Hives and Rash    Reaction to fumes from latex paint     Other Procedures/Studies: CT ABDOMEN PELVIS WO CONTRAST  Result Date: 12/04/2021 CLINICAL DATA:  Kidney failure, acute AKI. EXAM: CT ABDOMEN AND PELVIS WITHOUT CONTRAST TECHNIQUE: Multidetector CT imaging of the abdomen and pelvis was performed following the standard protocol without IV contrast. RADIATION DOSE REDUCTION: This exam was performed according to the departmental dose-optimization program which includes automated exposure control, adjustment of the mA and/or kV according to patient size and/or use of iterative reconstruction technique. COMPARISON:  10/10/2021. FINDINGS: Lower chest: The heart is enlarged. The distal tip of a central venous catheter terminates in the right atrium. Multiple nodules are present at the lung bases, the largest in the right lower lobe measuring 3.1 cm, increased in size and number as compared with the prior exam. A cavitary nodule is present in the left lower lobe. Hepatobiliary: No focal liver abnormality is seen. No gallstones, gallbladder wall thickening, or biliary dilatation. Pancreas: Pancreatic atrophy is noted. No pancreatic ductal dilatation or surrounding inflammatory changes. Spleen: Normal in  size without focal abnormality. Adrenals/Urinary Tract: The adrenal glands are within normal limits. No renal calculus is seen bilaterally. The collecting systems are prominent bilaterally with no evidence of ureteral calculus. Mild perinephric fat stranding is noted bilaterally. The urinary bladder is distended and perivesicular fat stranding is noted. Stomach/Bowel: The stomach is within normal limits. No bowel obstruction, free air, or pneumatosis. There is a periumbilical hernia and umbilical hernia containing nonobstructed small bowel. The appendix is not visualized on exam. No focal bowel wall thickening. Vascular/Lymphatic: Aortic atherosclerosis. No enlarged abdominal or pelvic lymph nodes. Reproductive: The prostate gland is not well seen on exam and may be small. Other: No free fluid. Fat containing inguinal hernias are present bilaterally. Musculoskeletal: No acute or suspicious osseous abnormality. IMPRESSION: 1. Mild prominence of the renal collecting systems bilaterally with perinephric a periureteral fat stranding. No ureteral calculus is identified. Findings may be infectious or inflammatory. 2. The urinary bladder is distended with perivesicular fat stranding, possible infectious or inflammatory cystitis. 3. Increased size and number of pulmonary nodules at the lung bases, compatible with known metastatic disease. 4. Aortic atherosclerosis. 5. Remaining chronic findings as described above. Electronically Signed   By: Brett Fairy M.D.   On: 12/04/2021 00:49   DG Chest 1 View  Result Date: 12/28/2021 CLINICAL DATA:  Central line placement EXAM: CHEST  1 VIEW COMPARISON:  12/03/2021 FINDINGS: Right Port-A-Cath remains in place with the tip in the upper right atrium. Interval placement of right internal jugular central line with the tip in the SVC. No pneumothorax. Numerous bilateral nodules and masses compatible with metastatic disease, unchanged. Heart is normal size. No effusions. IMPRESSION:  Right internal jugular central line tip in the SVC. No pneumothorax. Numerous bilateral pulmonary nodules and masses compatible with metastatic disease, unchanged. Electronically Signed   By: Rolm Baptise M.D.   On: 12/28/2021 22:29   CT HEAD WO CONTRAST (5MM)  Result Date: 12/27/2021 CLINICAL DATA:  Mental status change EXAM: CT HEAD WITHOUT CONTRAST TECHNIQUE: Contiguous axial images were obtained from the base  of the skull through the vertex without intravenous contrast. RADIATION DOSE REDUCTION: This exam was performed according to the departmental dose-optimization program which includes automated exposure control, adjustment of the mA and/or kV according to patient size and/or use of iterative reconstruction technique. COMPARISON:  MRI brain 12/05/2021, CT brain 08/17/2010 FINDINGS: Brain: No acute territorial infarction, hemorrhage or intracranial mass. The ventricles are nonenlarged. Vascular: No hyperdense vessels.  No unexpected calcification. Skull: Normal. Negative for fracture or focal lesion. Sinuses/Orbits: Small fluid level left maxillary sinus Other: None IMPRESSION: 1. No CT evidence for acute intracranial abnormality. 2. Left maxillary sinusitis Electronically Signed   By: Donavan Foil M.D.   On: 12/27/2021 19:50   CT Head Wo Contrast  Result Date: 12/03/2021 CLINICAL DATA:  New onset seizure. EXAM: CT HEAD WITHOUT CONTRAST TECHNIQUE: Contiguous axial images were obtained from the base of the skull through the vertex without intravenous contrast. RADIATION DOSE REDUCTION: This exam was performed according to the departmental dose-optimization program which includes automated exposure control, adjustment of the mA and/or kV according to patient size and/or use of iterative reconstruction technique. COMPARISON:  CT head 08/17/2010. FINDINGS: Brain: No evidence of acute infarction, hemorrhage, hydrocephalus, extra-axial collection or mass lesion/mass effect. Vascular: No hyperdense vessel or  unexpected calcification. Skull: Normal. Negative for fracture or focal lesion. Sinuses/Orbits: No acute finding. Other: None. IMPRESSION: No acute intracranial abnormality. Electronically Signed   By: Ronney Asters M.D.   On: 12/03/2021 23:54   MR BRAIN WO CONTRAST  Result Date: 12/05/2021 CLINICAL DATA:  51 year old male with metastatic rectal adenocarcinoma. Seizure. Query brain metastasis. EXAM: MRI HEAD WITHOUT CONTRAST TECHNIQUE: Multiplanar, multiecho pulse sequences of the brain and surrounding structures were obtained without intravenous contrast. COMPARISON:  Head CT 12/03/2021. FINDINGS: Brain: No contrast administered. No cerebral vasogenic edema or mass effect identified. No restricted diffusion to suggest acute infarction. No midline shift, ventriculomegaly, extra-axial collection or acute intracranial hemorrhage. Cervicomedullary junction and pituitary are within normal limits. On thin slice coronal imaging hippocampal formations and other mesial temporal lobe structures appear symmetric and within normal limits. Mild for age scattered nonspecific cerebral white matter T2 and FLAIR hyperintensity, mostly periventricular and subcortical. No cortical encephalomalacia identified. No chronic cerebral blood products identified. Negative deep gray nuclei, brainstem and cerebellum. Vascular: Major intracranial vascular flow voids are preserved. Skull and upper cervical spine: Nonspecific decreased T1 marrow signal in the cervical spine beginning at C2. Skull bone marrow signal is within normal limits. Sinuses/Orbits: Mildly Disconjugate gaze, otherwise negative orbits. Mild paranasal sinus mucosal thickening. Other: Mastoids are well aerated. Visible internal auditory structures appear normal. Negative visible scalp and face. IMPRESSION: 1. No cerebral metastatic disease or acute intracranial abnormality is evident on this non-contrast exam. But note that early metastatic disease cannot be excluded. 2.  Nonspecific decreased bone marrow signal in the visible cervical spine, perhaps treatment related red marrow reactivation rather than due to bony metastatic disease. Electronically Signed   By: Genevie Ann M.D.   On: 12/05/2021 11:16   US Renal  Result Date: 12/27/2021 CLINICAL DATA:  Renal failure. EXAM: RENAL / URINARY TRACT ULTRASOUND COMPLETE COMPARISON:  CTs without contrast 12/04/2021 , 10/10/2021 FINDINGS: Factors affecting image quality: Patient body habitus and bowel gas shadowing, with deep location of the kidneys attenuating the ultrasound beam. Right Kidney: Renal measurements: 9.6 x 5.4 x 5.5 cm = volume: 150.47 mL. Echogenicity mildly increased without appreciable cortical volume loss. No mass or stone is seen. Mild hydronephrosis is again noted. Left Kidney:  Renal measurements: 10.6 x 7.6 x 5.9 cm = volume: 250.08 mL. Echogenicity mildly increased without cortical volume loss. No mass or stone visualized. There is mild-to-moderate left hydronephrosis which was also seen previously. Bladder: Appears normal for degree of bladder distention. Calculated bladder volume 571.25 mL. Ureteral jets could not be demonstrated. Other: None. IMPRESSION: 1. Mild bilateral increased cortical echogenicity consistent with medical renal disease. 2. Left-greater-than-right hydronephrosis, which was seen on the most recent CT but was not present on 10/10/2021. No urinary stone disease is evident. 3. Technically limited exam. Electronically Signed   By: Telford Nab M.D.   On: 12/27/2021 22:04   CT TIBIA FIBULA RIGHT WO CONTRAST  Result Date: 12/07/2021 CLINICAL DATA:  Indeterminate bone lesion, sepsis EXAM: CT OF THE LOWER RIGHT EXTREMITY WITHOUT CONTRAST TECHNIQUE: Multidetector CT imaging of the right lower extremity was performed according to the standard protocol. RADIATION DOSE REDUCTION: This exam was performed according to the departmental dose-optimization program which includes automated exposure control,  adjustment of the mA and/or kV according to patient size and/or use of iterative reconstruction technique. COMPARISON:  None. FINDINGS: Bones/Joint/Cartilage A well-circumscribed subarticular cystic lesion demonstrating sclerotic margins and no significant matrix is seen within the proximal tibia subjacent to the a medial intercondylar spine most in keeping with a simple bone cyst. No other focal osseous rib lesions or erosions are identified. No abnormal periosteal reaction. No fracture. Mild degenerative arthritis is present within the medial compartment and, to a lesser extent, the patellofemoral compartment with asymmetric joint space narrowing and subchondral sclerosis. Ligaments Suboptimally assessed by CT. Muscles and Tendons Unremarkable. Soft tissues Mild prepatellar subcutaneous edema. Mild circumferential subcutaneous edema within the right ankle in visualized right hindfoot. No discrete drainable subcutaneous fluid collection identified. 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 No aggressive bone lesions or erosions identified. Prepatellar and peripheral subcutaneous edema. No subcutaneous loculated fluid collections identified. Electronically Signed   By: Fidela Salisbury M.D.   On: 12/07/2021 03:09   DG Chest Port 1 View  Result Date: 12/03/2021 CLINICAL DATA:  Shortness of breath, seizure. EXAM: PORTABLE CHEST 1 VIEW COMPARISON:  Chest x-ray 07/18/2021. CT chest 07/17/2021. FINDINGS: Right chest port catheter tip projects over the cavoatrial junction, unchanged. Lung volumes are low. Bilateral pulmonary nodules are grossly unchanged. Airspace disease has increased in the right lower lung. There is no definite pleural effusion or pneumothorax. No acute fractures are seen. The heart is enlarged, unchanged. IMPRESSION: 1. Bilateral pulmonary nodules are grossly unchanged. 2. New patchy airspace disease in the right lower lung worrisome for infection. Followup PA and lateral  chest X-ray is recommended in 3-4 weeks following trial of antibiotic therapy to ensure resolution and exclude underlying malignancy. Electronically Signed   By: Ronney Asters M.D.   On: 12/03/2021 23:17   EEG adult  Result Date: 12/04/2021 Lora Havens, MD     12/04/2021 10:21 AM Patient Name: Jamesmichael Shadd MRN: [id number] Epilepsy Attending: Lora Havens Referring Physician/Provider: Donnetta Simpers, MD Date: 12/04/2021 Duration: 22.34 mins Patient history:  51 y.o. male with PMH significant for metastatic rectal cancer with pulm mets and multiple rounds of chemo and s/p radiation who presented to the ED with first seizure in the setting of significant electrolyte derangement from potential obstructive uropathy, urosepsis, AKI, uremia and hyperkalemia. EEG to evaluate for seizure Level of alertness: Awake, drowsy AEDs during EEG study: None Technical aspects: This EEG study was done with scalp electrodes positioned according to the 10-20 International system  of electrode placement. Electrical activity was acquired at a sampling rate of 500Hz  and reviewed with a high frequency filter of 70Hz  and a low frequency filter of 1Hz . EEG data were recorded continuously and digitally stored. Description: The posterior dominant rhythm consists of 7.5 Hz activity of moderate voltage (25-35 uV) seen predominantly in posterior head regions, symmetric and reactive to eye opening and eye closing. Drowsiness was characterized by attenuation of the posterior background rhythm.  Sharp transients were noted in left occipital region.  Hyperventilation and photic stimulation were not performed.   IMPRESSION: This study is within normal limits. No seizures or epileptiform discharges were seen throughout the recording. Priyanka O Yadav   VAS [place] LOWER EXTREMITY VENOUS (DVT)  Result Date: 12/09/2021  Lower Venous DVT Study Patient Name:  TYRIAN PEART  Date of Exam:   12/08/2021 Medical Rec #: [id number]   Accession #:    [id number]  Date of Birth: 1971-10-15  Patient Gender: M Patient Age:   52 years Exam Location:  [facility] Procedure:      VAS [place] LOWER EXTREMITY VENOUS (DVT) Referring Phys: Gean Birchwood --------------------------------------------------------------------------------  Indications: Pain.  Comparison Study: prior neg-12/04/21 Performing Technologist: Archie Patten RVS  Examination Guidelines: A complete evaluation includes B-mode imaging, spectral Doppler, color Doppler, and power Doppler as needed of all accessible portions of each vessel. Bilateral testing is considered an integral part of a complete examination. Limited examinations for reoccurring indications may be performed as noted. The reflux portion of the exam is performed with the patient in reverse Trendelenburg.  +---------+---------------+---------+-----------+----------+--------------+  RIGHT     Compressibility Phasicity Spontaneity Properties Thrombus Aging  +---------+---------------+---------+-----------+----------+--------------+  CFV       Full            Yes       Yes                                    +---------+---------------+---------+-----------+----------+--------------+  SFJ       Full                                                             +---------+---------------+---------+-----------+----------+--------------+  FV Prox   Full                                                             +---------+---------------+---------+-----------+----------+--------------+  FV Mid    Full                                                             +---------+---------------+---------+-----------+----------+--------------+  FV Distal Full                                                             +---------+---------------+---------+-----------+----------+--------------+  PFV       Full                                                             +---------+---------------+---------+-----------+----------+--------------+  POP       Full             Yes       Yes                                    +---------+---------------+---------+-----------+----------+--------------+  PTV       Full                                                             +---------+---------------+---------+-----------+----------+--------------+  PERO      Full                                                             +---------+---------------+---------+-----------+----------+--------------+   +----+---------------+---------+-----------+----------+--------------+  LEFT Compressibility Phasicity Spontaneity Properties Thrombus Aging  +----+---------------+---------+-----------+----------+--------------+  CFV  Full            Yes       Yes                                    +----+---------------+---------+-----------+----------+--------------+     Summary: RIGHT: - Findings appear essentially unchanged compared to previous examination. - There is no evidence of deep vein thrombosis in the lower extremity.  - No cystic structure found in the popliteal fossa.  LEFT: - No evidence of common femoral vein obstruction.  *See table(s) above for measurements and observations. Electronically signed by Orlie Pollen on 12/09/2021 at 26:48:15 PM.    Final    VAS [place] LOWER EXTREMITY VENOUS (DVT)  Result Date: 12/04/2021  Lower Venous DVT Study Patient Name:  ALBERTO PINA  Date of Exam:   12/04/2021 Medical Rec #: [id number]   Accession #:    [id number] Date of Birth: Dec 20, 1970  Patient Gender: M Patient Age:   23 years Exam Location:  [facility] Procedure:      VAS [place] LOWER EXTREMITY VENOUS (DVT) Referring Phys: Marzetta Board --------------------------------------------------------------------------------  Indications: Swelling, and Edema.  Comparison Study: 03/21/21 prior Performing Technologist: Archie Patten RVS  Examination Guidelines: A complete evaluation includes B-mode imaging, spectral Doppler, color Doppler, and power Doppler as needed of all accessible portions of each  vessel. Bilateral testing is considered an integral part of a complete examination. Limited examinations for reoccurring indications may be performed as noted. The reflux portion of the exam is performed with the patient in reverse Trendelenburg.  +---------+---------------+---------+-----------+----------+--------------+  RIGHT     Compressibility Phasicity Spontaneity Properties Thrombus Aging  +---------+---------------+---------+-----------+----------+--------------+  CFV       Full  Yes       Yes                                    +---------+---------------+---------+-----------+----------+--------------+  SFJ       Full                                                             +---------+---------------+---------+-----------+----------+--------------+  FV Prox   Full                                                             +---------+---------------+---------+-----------+----------+--------------+  FV Mid    Full                                                             +---------+---------------+---------+-----------+----------+--------------+  FV Distal Full                                                             +---------+---------------+---------+-----------+----------+--------------+  PFV       Full                                                             +---------+---------------+---------+-----------+----------+--------------+  POP       Full            Yes       Yes                                    +---------+---------------+---------+-----------+----------+--------------+  PTV       Full                                                             +---------+---------------+---------+-----------+----------+--------------+  PERO      Full                                                             +---------+---------------+---------+-----------+----------+--------------+   +---------+---------------+---------+-----------+----------+--------------+  LEFT       Compressibility Phasicity  Spontaneity Properties Thrombus Aging  +---------+---------------+---------+-----------+----------+--------------+  CFV       Full            Yes       Yes                                    +---------+---------------+---------+-----------+----------+--------------+  SFJ       Full                                                             +---------+---------------+---------+-----------+----------+--------------+  FV Prox   Full                                                             +---------+---------------+---------+-----------+----------+--------------+  FV Mid    Full                                                             +---------+---------------+---------+-----------+----------+--------------+  FV Distal Full                                                             +---------+---------------+---------+-----------+----------+--------------+  PFV       Full                                                             +---------+---------------+---------+-----------+----------+--------------+  POP       Full            Yes       Yes                                    +---------+---------------+---------+-----------+----------+--------------+  PTV       Full                                                             +---------+---------------+---------+-----------+----------+--------------+  PERO      Full                                                             +---------+---------------+---------+-----------+----------+--------------+  Summary: BILATERAL: - No evidence of deep vein thrombosis seen in the lower extremities, bilaterally. -No evidence of popliteal cyst, bilaterally.   *See table(s) above for measurements and observations. Electronically signed by Jamelle Haring on 12/04/2021 at 3:34:12 PM.    Final      TODAY-DAY OF DISCHARGE:  Subjective:   Bradley Colon today has no headache,no chest abdominal pain,no new weakness tingling or numbness, feels much  better wants to go home today.  Objective:   Blood pressure (!) 154/93, pulse 91, temperature 98.5 F (36.9 C), temperature source Oral, resp. rate 19, weight 78 kg, SpO2 100 %.  Intake/Output Summary (Last 24 hours) at 01/01/2022 1046 Last data filed at 01/01/2022 0331 Gross per 24 hour  Intake 100 ml  Output 3000 ml  Net -2900 ml   Filed Weights   12/28/21 2309  Weight: 78 kg    Exam: Awake Alert, Oriented *3, No new F.N deficits, Normal affect [place].AT,PERRAL Supple Neck,No JVD, No cervical lymphadenopathy appriciated.  Symmetrical Chest wall movement, Good air movement bilaterally, CTAB RRR,No Gallops,Rubs or new Murmurs, No Parasternal Heave +ve B.Sounds, Abd Soft, Non tender, No organomegaly appriciated, No rebound -guarding or rigidity. No Cyanosis, Clubbing or edema, No new Rash or bruise   PERTINENT RADIOLOGIC STUDIES: No results found.   PERTINENT LAB RESULTS: CBC: Recent Labs    12/30/21 0400 01/01/22 0354  WBC 11.9* 9.5  HGB 9.3* 8.4*  HCT 28.4* 25.8*  PLT 295 285   CMET CMP     Component Value Date/Time   NA 135 01/01/2022 0354   NA 143 04/04/2021 1039   K 3.9 01/01/2022 0354   CL 102 01/01/2022 0354   CO2 24 01/01/2022 0354   GLUCOSE 85 01/01/2022 0354   BUN 20 01/01/2022 0354   BUN 15 04/04/2021 1039   CREATININE 3.57 (H) 01/01/2022 0354   CREATININE 0.81 10/07/2021 1335   CREATININE 1.10 07/22/2016 1540   CALCIUM 8.6 (L) 01/01/2022 0354   PROT 8.0 12/27/2021 1857   ALBUMIN 2.5 (L) 01/01/2022 0354   AST 17 12/27/2021 1857   AST 16 10/07/2021 1335   ALT 10 12/27/2021 1857   ALT 16 10/07/2021 1335   ALKPHOS 82 12/27/2021 1857   BILITOT 0.5 12/27/2021 1857   BILITOT 0.5 10/07/2021 1335   GFRNONAA 20 (L) 01/01/2022 0354   GFRNONAA >60 10/07/2021 1335   GFRNONAA 80 07/22/2016 1540   GFRAA >60 01/02/2020 2046   GFRAA >89 07/22/2016 1540    GFR Estimated Creatinine Clearance: 22.6 mL/min (A) (by C-G formula based on SCr of 3.57 mg/dL  (H)). No results for input(s): LIPASE, AMYLASE in the last 72 hours. No results for input(s): CKTOTAL, CKMB, CKMBINDEX, TROPONINI in the last 72 hours. Invalid input(s): POCBNP No results for input(s): DDIMER in the last 72 hours. No results for input(s): HGBA1C in the last 72 hours. No results for input(s): CHOL, HDL, LDLCALC, TRIG, CHOLHDL, LDLDIRECT in the last 72 hours. No results for input(s): TSH, T4TOTAL, T3FREE, THYROIDAB in the last 72 hours.  Invalid input(s): FREET3 No results for input(s): VITAMINB12, FOLATE, FERRITIN, TIBC, IRON, RETICCTPCT in the last 72 hours. Coags: No results for input(s): INR in the last 72 hours.  Invalid input(s): PT Microbiology: Recent Results (from the past 240 hour(s))  Blood culture (routine x 2)     Status: None (Preliminary result)   Collection Time: 12/27/21  6:38 PM   Specimen: BLOOD  Result Value Ref Range Status   Specimen Description BLOOD RIGHT ANTECUBITAL  Final   Special Requests   Final    BOTTLES DRAWN AEROBIC AND ANAEROBIC Blood Culture adequate volume   Culture   Final    NO GROWTH 4 DAYS Performed at [facility], [address], [place], [place] [address]    Report Status PENDING  Incomplete  Blood culture (routine x 2)     Status: None (Preliminary result)   Collection Time: 12/27/21  6:58 PM   Specimen: BLOOD  Result Value Ref Range Status   Specimen Description BLOOD SITE NOT SPECIFIED  Final   Special Requests   Final    BOTTLES DRAWN AEROBIC AND ANAEROBIC Blood Culture adequate volume   Culture   Final    NO GROWTH 4 DAYS Performed at [facility], [address], [place], [place] [address]    Report Status PENDING  Incomplete  Resp Panel by RT-PCR (Flu A&B, Covid) Nasopharyngeal Swab     Status: None   Collection Time: 12/27/21  9:50 PM   Specimen: Nasopharyngeal Swab; Nasopharyngeal(NP) swabs in vial transport medium  Result Value Ref Range Status   SARS Coronavirus 2 by RT PCR NEGATIVE NEGATIVE  Final    Comment: (NOTE) SARS-CoV-2 target nucleic acids are NOT DETECTED.  The SARS-CoV-2 RNA is generally detectable in upper respiratory specimens during the acute phase of infection. The lowest concentration of SARS-CoV-2 viral copies this assay can detect is 138 copies/mL. A negative result does not preclude SARS-Cov-2 infection and should not be used as the sole basis for treatment or other patient management decisions. A negative result may occur with  improper specimen collection/handling, submission of specimen other than nasopharyngeal swab, presence of viral mutation(s) within the areas targeted by this assay, and inadequate number of viral copies(<138 copies/mL). A negative result must be combined with clinical observations, patient history, and epidemiological information. The expected result is Negative.  Fact Sheet for Patients:  EntrepreneurPulse.com.au  Fact Sheet for Healthcare Providers:  IncredibleEmployment.be  This test is no t yet approved or cleared by the [place] FDA and  has been authorized for detection and/or diagnosis of SARS-CoV-2 by FDA under an Emergency Use Authorization (EUA). This EUA will remain  in effect (meaning this test can be used) for the duration of the COVID-19 declaration under Section 564(b)(1) of the Act, 21 U.S.C.section 360bbb-3(b)(1), unless the authorization is terminated  or revoked sooner.       Influenza A by PCR NEGATIVE NEGATIVE Final   Influenza B by PCR NEGATIVE NEGATIVE Final    Comment: (NOTE) The Xpert Xpress SARS-CoV-2/FLU/RSV plus assay is intended as an aid in the diagnosis of influenza from Nasopharyngeal swab specimens and should not be used as a sole basis for treatment. Nasal washings and aspirates are unacceptable for Xpert Xpress SARS-CoV-2/FLU/RSV testing.  Fact Sheet for Patients: EntrepreneurPulse.com.au  Fact Sheet for Healthcare  Providers: IncredibleEmployment.be  This test is not yet approved or cleared by the [place] FDA and has been authorized for detection and/or diagnosis of SARS-CoV-2 by FDA under an Emergency Use Authorization (EUA). This EUA will remain in effect (meaning this test can be used) for the duration of the COVID-19 declaration under Section 564(b)(1) of the Act, 21 U.S.C. section 360bbb-3(b)(1), unless the authorization is terminated or revoked.  Performed at [facility], [address], [place], [place] [address]   Urine Culture     Status: None   Collection Time: 12/27/21 10:24 PM   Specimen: Urine, Catheterized  Result Value  Ref Range Status   Specimen Description URINE, CATHETERIZED  Final   Special Requests NONE  Final   Culture   Final    NO GROWTH Performed at [facility], [address], [place], [place] [address]    Report Status 12/29/2021 FINAL  Final    FURTHER DISCHARGE INSTRUCTIONS:  Get Medicines reviewed and adjusted: Please take all your medications with you for your next visit with your Primary MD  Laboratory/radiological data: Please request your Primary MD to go over all hospital tests and procedure/radiological results at the follow up, please ask your Primary MD to get all Hospital records sent to his/her office.  In some cases, they will be blood work, cultures and biopsy results pending at the time of your discharge. Please request that your primary care M.D. goes through all the records of your hospital data and follows up on these results.  Also Note the following: If you experience worsening of your admission symptoms, develop shortness of breath, life threatening emergency, suicidal or homicidal thoughts you must seek medical attention immediately by calling 911 or calling your MD immediately  if symptoms less severe.  You must read complete instructions/literature along with all the possible adverse reactions/side  effects for all the Medicines you take and that have been prescribed to you. Take any new Medicines after you have completely understood and accpet all the possible adverse reactions/side effects.   Do not drive when taking Pain medications or sleeping medications (Benzodaizepines)  Do not take more than prescribed Pain, Sleep and Anxiety Medications. It is not advisable to combine anxiety,sleep and pain medications without talking with your primary care practitioner  Special Instructions: If you have smoked or chewed Tobacco  in the last 2 yrs please stop smoking, stop any regular Alcohol  and or any Recreational drug use.  Wear Seat belts while driving.  Please note: You were cared for by a hospitalist during your hospital stay. Once you are discharged, your primary care physician will handle any further medical issues. Please note that NO REFILLS for any discharge medications will be authorized once you are discharged, as it is imperative that you return to your primary care physician (or establish a relationship with a primary care physician if you do not have one) for your post hospital discharge needs so that they can reassess your need for medications and monitor your lab values.  Total Time spent coordinating discharge including counseling, education and face to face time equals greater than 30 minutes.  SignedOren Binet 01/01/2022 10:46 AM

## 2022-01-01 NOTE — Progress Notes (Signed)
Explained the discharge summary to the patient. Port was deaccessed by IV Team. All belongings were in the patient's possession when he was discharged. Transported the patient downstairs for discharge.  ?

## 2022-01-09 ENCOUNTER — Other Ambulatory Visit: Payer: Self-pay | Admitting: Internal Medicine

## 2022-01-10 ENCOUNTER — Other Ambulatory Visit: Payer: Self-pay | Admitting: Internal Medicine

## 2022-01-10 ENCOUNTER — Other Ambulatory Visit (HOSPITAL_COMMUNITY): Payer: Self-pay

## 2022-01-10 MED ORDER — OXYCODONE HCL 20 MG PO TABS: 20.0000 mg | ORAL_TABLET | Freq: Three times a day (TID) | ORAL | 0 refills | Status: DC | PRN

## 2022-01-10 MED ORDER — MORPHINE SULFATE ER 60 MG PO TBCR: 60.0000 mg | EXTENDED_RELEASE_TABLET | Freq: Two times a day (BID) | ORAL | 0 refills | Status: DC

## 2022-01-14 ENCOUNTER — Ambulatory Visit (INDEPENDENT_AMBULATORY_CARE_PROVIDER_SITE_OTHER): Payer: Medicaid Other | Admitting: Family Medicine

## 2022-01-14 ENCOUNTER — Inpatient Hospital Stay: Payer: Medicaid Other

## 2022-01-14 ENCOUNTER — Other Ambulatory Visit: Payer: Self-pay

## 2022-01-14 ENCOUNTER — Encounter: Payer: Self-pay | Admitting: Family Medicine

## 2022-01-14 ENCOUNTER — Inpatient Hospital Stay: Payer: Medicaid Other | Admitting: Oncology

## 2022-01-14 VITALS — BP 119/81 | HR 115 | Temp 98.0°F | Resp 16 | Wt <= 1120 oz

## 2022-01-14 DIAGNOSIS — Z87448 Personal history of other diseases of urinary system: Secondary | ICD-10-CM

## 2022-01-14 DIAGNOSIS — I10 Essential (primary) hypertension: Secondary | ICD-10-CM | POA: Diagnosis not present

## 2022-01-14 DIAGNOSIS — E875 Hyperkalemia: Secondary | ICD-10-CM

## 2022-01-14 DIAGNOSIS — R7989 Other specified abnormal findings of blood chemistry: Secondary | ICD-10-CM

## 2022-01-14 DIAGNOSIS — C20 Malignant neoplasm of rectum: Secondary | ICD-10-CM

## 2022-01-14 DIAGNOSIS — F32A Depression, unspecified: Secondary | ICD-10-CM
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
## 2022-01-14 NOTE — Progress Notes (Signed)
Patient is here to est care w/PCP. Patient has multiple issues going on with him. Patient has htn, rectal issues, kidney issues and others. Patient would like blood work done today, for his c/o fatigue ?

## 2022-01-15 ENCOUNTER — Encounter: Payer: Self-pay | Admitting: Family Medicine

## 2022-01-15 ENCOUNTER — Encounter: Payer: Self-pay | Admitting: Oncology

## 2022-01-15 ENCOUNTER — Encounter: Payer: Self-pay | Admitting: *Deleted

## 2022-01-15 LAB — CMP14+EGFR
ALT: 10 IU/L (ref 0–44)
AST: 17 IU/L (ref 0–40)
Albumin/Globulin Ratio: 1 — ABNORMAL LOW (ref 1.2–2.2)
Albumin: 4.3 g/dL (ref 3.8–4.9)
Alkaline Phosphatase: 131 IU/L — ABNORMAL HIGH (ref 44–121)
BUN/Creatinine Ratio: 11 (ref 9–20)
BUN: 37 mg/dL — ABNORMAL HIGH (ref 6–24)
Bilirubin Total: 0.4 mg/dL (ref 0.0–1.2)
CO2: 18 mmol/L — ABNORMAL LOW (ref 20–29)
Calcium: 10.6 mg/dL — ABNORMAL HIGH (ref 8.7–10.2)
Chloride: 98 mmol/L (ref 96–106)
Creatinine, Ser: 3.37 mg/dL — ABNORMAL HIGH (ref 0.76–1.27)
Globulin, Total: 4.1 g/dL (ref 1.5–4.5)
Glucose: 96 mg/dL (ref 70–99)
Potassium: 6 mmol/L — ABNORMAL HIGH (ref 3.5–5.2)
Sodium: 138 mmol/L (ref 134–144)
Total Protein: 8.4 g/dL (ref 6.0–8.5)
eGFR: 21 mL/min/{1.73_m2} — ABNORMAL LOW (ref >59)

## 2022-01-15 LAB — CBC WITH DIFFERENTIAL/PLATELET
Basophils Absolute: 0.1 10*3/uL (ref 0.0–0.2)
Basos: 1 %
EOS (ABSOLUTE): 0.6 10*3/uL — ABNORMAL HIGH (ref 0.0–0.4)
Eos: 6 %
Hematocrit: 34.2 % — ABNORMAL LOW (ref 37.5–51.0)
Hemoglobin: 10.7 g/dL — ABNORMAL LOW (ref 13.0–17.7)
Immature Grans (Abs): 0.1 10*3/uL (ref 0.0–0.1)
Immature Granulocytes: 1 %
Lymphocytes Absolute: 2.2 10*3/uL (ref 0.7–3.1)
Lymphs: 23 %
MCH: 26 pg — ABNORMAL LOW (ref 26.6–33.0)
MCHC: 31.3 g/dL — ABNORMAL LOW (ref 31.5–35.7)
MCV: 83 fL (ref 79–97)
Monocytes Absolute: 0.7 10*3/uL (ref 0.1–0.9)
Monocytes: 7 %
Neutrophils Absolute: 6 10*3/uL (ref 1.4–7.0)
Neutrophils: 62 %
Platelets: 677 10*3/uL — ABNORMAL HIGH (ref 150–450)
RBC: 4.11 x10E6/uL — ABNORMAL LOW (ref 4.14–5.80)
RDW: 14.4 % (ref 11.6–15.4)
WBC: 9.7 10*3/uL (ref 3.4–10.8)

## 2022-01-30 DIAGNOSIS — R339 Retention of urine, unspecified: Secondary | ICD-10-CM | POA: Diagnosis not present

## 2022-02-10 ENCOUNTER — Other Ambulatory Visit: Payer: Self-pay | Admitting: Internal Medicine

## 2022-02-10 MED ORDER — MORPHINE SULFATE ER 60 MG PO TBCR: 60.0000 mg | EXTENDED_RELEASE_TABLET | Freq: Two times a day (BID) | ORAL | 0 refills | Status: DC

## 2022-02-10 MED ORDER — OXYCODONE HCL 20 MG PO TABS: 20.0000 mg | ORAL_TABLET | Freq: Three times a day (TID) | ORAL | 0 refills | Status: DC | PRN

## 2022-02-11 ENCOUNTER — Encounter: Payer: Self-pay | Admitting: Nurse Practitioner

## 2022-02-11 ENCOUNTER — Other Ambulatory Visit: Payer: Self-pay

## 2022-02-11 ENCOUNTER — Inpatient Hospital Stay: Payer: Medicaid Other | Attending: Nurse Practitioner | Admitting: Nurse Practitioner

## 2022-02-11 VITALS — BP 111/83 | HR 112 | Temp 98.4°F | Resp 20 | Ht 67.0 in | Wt 166.0 lb

## 2022-02-11 DIAGNOSIS — Z08 Encounter for follow-up examination after completed treatment for malignant neoplasm: Secondary | ICD-10-CM | POA: Diagnosis present

## 2022-02-11 DIAGNOSIS — Z85048 Personal history of other malignant neoplasm of rectum, rectosigmoid junction, and anus: Secondary | ICD-10-CM | POA: Diagnosis present

## 2022-02-11 DIAGNOSIS — G629 Polyneuropathy, unspecified: Secondary | ICD-10-CM | POA: Diagnosis not present

## 2022-02-11 DIAGNOSIS — N19 Unspecified kidney failure: Secondary | ICD-10-CM | POA: Diagnosis not present

## 2022-02-11 DIAGNOSIS — Z515 Encounter for palliative care: Secondary | ICD-10-CM | POA: Diagnosis not present

## 2022-02-11 DIAGNOSIS — C20 Malignant neoplasm of rectum: Secondary | ICD-10-CM | POA: Diagnosis not present

## 2022-02-11 DIAGNOSIS — K5900 Constipation, unspecified: Secondary | ICD-10-CM

## 2022-02-11 DIAGNOSIS — C7800 Secondary malignant neoplasm of unspecified lung: Secondary | ICD-10-CM

## 2022-02-11 DIAGNOSIS — G893 Neoplasm related pain (acute) (chronic): Secondary | ICD-10-CM

## 2022-02-11 MED ORDER — GABAPENTIN 300 MG PO CAPS: 300.0000 mg | ORAL_CAPSULE | Freq: Two times a day (BID) | ORAL | 1 refills | Status: DC
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
## 2022-02-12 ENCOUNTER — Telehealth: Payer: Self-pay | Admitting: Nurse Practitioner

## 2022-02-12 NOTE — Telephone Encounter (Signed)
Scheduled per 4/11 los, pt has been called and confirmed  ?

## 2022-02-14 ENCOUNTER — Telehealth: Payer: Self-pay

## 2022-02-14 NOTE — Telephone Encounter (Signed)
Bradley Colon called our Palliative Care Office at [facility] to report that he was still having pain in his leg, hip, and bottom. He stated that this has cleared but is still having twitching. Dr. Hilma Favors notified and wanting to see patient. Patient scheduled for Monday, 4/17 at 1pm and notified. Understanding verbalized.  ?

## 2022-02-17 ENCOUNTER — Other Ambulatory Visit: Payer: Self-pay

## 2022-02-17 ENCOUNTER — Inpatient Hospital Stay (HOSPITAL_BASED_OUTPATIENT_CLINIC_OR_DEPARTMENT_OTHER): Payer: Medicaid Other | Admitting: Internal Medicine

## 2022-02-17 ENCOUNTER — Encounter: Payer: Self-pay | Admitting: Internal Medicine

## 2022-02-17 ENCOUNTER — Telehealth: Payer: Self-pay

## 2022-02-17 VITALS — BP 122/84 | HR 137 | Temp 99.1°F | Resp 20 | Wt 164.1 lb

## 2022-02-17 DIAGNOSIS — C20 Malignant neoplasm of rectum: Secondary | ICD-10-CM

## 2022-02-17 DIAGNOSIS — Z515 Encounter for palliative care: Secondary | ICD-10-CM

## 2022-02-17 DIAGNOSIS — C7800 Secondary malignant neoplasm of unspecified lung: Secondary | ICD-10-CM

## 2022-02-17 DIAGNOSIS — K5900 Constipation, unspecified: Secondary | ICD-10-CM | POA: Diagnosis not present

## 2022-02-17 DIAGNOSIS — G893 Neoplasm related pain (acute) (chronic): Secondary | ICD-10-CM | POA: Diagnosis not present

## 2022-02-17 DIAGNOSIS — Z08 Encounter for follow-up examination after completed treatment for malignant neoplasm: Secondary | ICD-10-CM | POA: Diagnosis not present

## 2022-02-17 LAB — CBC WITH DIFFERENTIAL (CANCER CENTER ONLY)
Abs Immature Granulocytes: 0.46 K/uL — ABNORMAL HIGH (ref 0.00–0.07)
Basophils Absolute: 0.1 K/uL (ref 0.0–0.1)
Basophils Relative: 0 %
Eosinophils Absolute: 0.1 K/uL (ref 0.0–0.5)
Eosinophils Relative: 0 %
HCT: 25.7 % — ABNORMAL LOW (ref 39.0–52.0)
Hemoglobin: 8.7 g/dL — ABNORMAL LOW (ref 13.0–17.0)
Immature Granulocytes: 2 %
Lymphocytes Relative: 6 %
Lymphs Abs: 1.4 K/uL (ref 0.7–4.0)
MCH: 26.5 pg (ref 26.0–34.0)
MCHC: 33.9 g/dL (ref 30.0–36.0)
MCV: 78.4 fL — ABNORMAL LOW (ref 80.0–100.0)
Monocytes Absolute: 1.5 K/uL — ABNORMAL HIGH (ref 0.1–1.0)
Monocytes Relative: 6 %
Neutro Abs: 20.4 K/uL — ABNORMAL HIGH (ref 1.7–7.7)
Neutrophils Relative %: 86 %
Platelet Count: 540 K/uL — ABNORMAL HIGH (ref 150–400)
RBC: 3.28 MIL/uL — ABNORMAL LOW (ref 4.22–5.81)
RDW: 15 % (ref 11.5–15.5)
WBC Count: 23.9 K/uL — ABNORMAL HIGH (ref 4.0–10.5)
nRBC: 0 % (ref 0.0–0.2)

## 2022-02-17 LAB — COMPREHENSIVE METABOLIC PANEL WITH GFR
ALT: 25 U/L (ref 0–44)
AST: 23 U/L (ref 15–41)
Albumin: 3.5 g/dL (ref 3.5–5.0)
Alkaline Phosphatase: 217 U/L — ABNORMAL HIGH (ref 38–126)
Anion gap: 13 (ref 5–15)
BUN: 64 mg/dL — ABNORMAL HIGH (ref 6–20)
CO2: 20 mmol/L — ABNORMAL LOW (ref 22–32)
Calcium: 9.9 mg/dL (ref 8.9–10.3)
Chloride: 96 mmol/L — ABNORMAL LOW (ref 98–111)
Creatinine, Ser: 5.3 mg/dL (ref 0.61–1.24)
GFR, Estimated: 12 mL/min — ABNORMAL LOW (ref >60)
Glucose, Bld: 136 mg/dL — ABNORMAL HIGH (ref 70–99)
Potassium: 4.5 mmol/L (ref 3.5–5.1)
Sodium: 129 mmol/L — ABNORMAL LOW (ref 135–145)
Total Bilirubin: 0.9 mg/dL (ref 0.3–1.2)
Total Protein: 8.4 g/dL — ABNORMAL HIGH (ref 6.5–8.1)

## 2022-02-17 LAB — URINALYSIS, ROUTINE W REFLEX MICROSCOPIC
Bilirubin Urine: NEGATIVE
Glucose, UA: NEGATIVE mg/dL
Ketones, ur: NEGATIVE mg/dL
Nitrite: NEGATIVE
Protein, ur: NEGATIVE mg/dL
Specific Gravity, Urine: 1.006 (ref 1.005–1.030)
WBC, UA: >50 WBC/hpf — ABNORMAL HIGH (ref 0–5)
pH: 5 (ref 5.0–8.0)

## 2022-02-17 MED ORDER — GABAPENTIN 300 MG PO CAPS
600.0000 mg | ORAL_CAPSULE | Freq: Two times a day (BID) | ORAL | 1 refills | Status: DC
Start: 2022-02-17 — End: 2022-04-23

## 2022-02-17 MED ORDER — CEPHALEXIN 250 MG PO CAPS: 250.0000 mg | ORAL_CAPSULE | Freq: Two times a day (BID) | ORAL | 0 refills | Status: AC

## 2022-02-17 NOTE — Telephone Encounter (Signed)
CRITICAL VALUE STICKER ? ?CRITICAL VALUE: creat 5.30 ? ?RECEIVER (on-site recipient of call):Leanne Chang RN ? ?DATE & TIME NOTIFIED: 02/17/22 16:05 ? ?MESSENGER (representative from lab): Lauren ? ?MD NOTIFIED: Dr. Hilma Favors ? ?TIME OF NOTIFICATION:16:07 ? ?RESPONSE:  no verbal order received, Dr. Hilma Favors will communicate to nephrology ?

## 2022-02-17 NOTE — Progress Notes (Signed)
Lab orders placed per Dr.Golding.  ?

## 2022-02-18 ENCOUNTER — Inpatient Hospital Stay: Payer: Medicaid Other

## 2022-02-18 LAB — CALCIUM, IONIZED: Calcium, Ionized, Serum: 5.2 mg/dL (ref 4.5–5.6)

## 2022-02-24 ENCOUNTER — Telehealth: Payer: Self-pay | Admitting: *Deleted

## 2022-02-24 LAB — BASIC METABOLIC PANEL
BUN: 40 — AB (ref 4–21)
CO2: 19 (ref 13–22)
Chloride: 98 — AB (ref 99–108)
Creatinine: 2.7 — AB (ref 0.6–1.3)
Glucose: 105
Potassium: 5.5 mEq/L — AB (ref 3.5–5.1)
Sodium: 135 — AB (ref 137–147)

## 2022-02-24 LAB — IRON,TIBC AND FERRITIN PANEL
Ferritin: 477
Iron: 32
UIBC: 203

## 2022-02-24 LAB — CBC AND DIFFERENTIAL
HCT: 27 — AB (ref 41–53)
Hemoglobin: 8.8 — AB (ref 13.5–17.5)
Neutrophils Absolute: 70
Platelets: 973 10*3/uL — AB (ref 150–400)
WBC: 13.3

## 2022-02-24 LAB — CBC: RBC: 3.28 — AB (ref 3.87–5.11)

## 2022-02-24 LAB — COMPREHENSIVE METABOLIC PANEL
Calcium: 10 (ref 8.7–10.7)
eGFR: 27

## 2022-02-24 NOTE — Telephone Encounter (Signed)
PC to patient, no answer, left VM - informed patient N. Pickenpack-Cousar NP can see him for appointment today at 1300 or 1400.  Instructed patient to call palliative care office & let us know when he wants to come in, [phone]. ?

## 2022-02-25 ENCOUNTER — Encounter: Payer: Medicaid Other | Admitting: Nurse Practitioner

## 2022-02-25 ENCOUNTER — Telehealth: Payer: Self-pay

## 2022-02-25 ENCOUNTER — Encounter: Payer: Self-pay | Admitting: Internal Medicine

## 2022-02-25 NOTE — Progress Notes (Signed)
Patient presented today with complaint of increased tremors and spasm like pain in his rectum and penis. 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 He is not currently undergoing treatment for his cancer and explains that he is trying to establish his cancer care at [facility] due to reported barriers with his med onc team here at [facility]. Palliative care has been supporting his pain and symptom management.  ? ?Tremors: Likely related to progression of his renal failure vs. Hypercalcemia or other electrolyte abnormality. Will obtain stat labwork today.  ?Labs returned and show significant progression of renal failure ?Urine shows probable infection ?Will treat with Cephalxin renally dosed for 14 days. ?Instructed him to I/O cath a minimum of every 4 hours-he had been going for long periods of time without cathing and getting over 800cc of urine out. ?Confirmed he is using sterile technique with IO cath and gave him tips on minimizing his infection risks. ? ?Continue his current pain regimen- He restarted gabapentin a a low dose based on his GFR-he can go up on this if needed as long as he does not have side effects to a max of 600 BID and discuss with nephrology. He has a follow up soon with kidney care. ?I dicussed his goals of care and encouraged him to establish with med onc or resume being seen here at [facility]-otherwise if he is not going to pursue additional treatment his cancer will continue to progress to the terminal stage and he would need hospice care. Continued to provide empathetic and supportive presence as he discussed his difficult journey. ? ?Follow-up in 2 weeks-sooner if his symptoms get worse. ? ?Lane Hacker, DO ? ?Time: 50 minutes ?Palliative Medicine ?  ?

## 2022-02-25 NOTE — Telephone Encounter (Signed)
Pt was a no-show for Palliative apt today. Called and left VM requesting that pt call [place] to reschedule. ?

## 2022-03-07 ENCOUNTER — Telehealth: Payer: Self-pay | Admitting: Nurse Practitioner

## 2022-03-07 NOTE — Telephone Encounter (Signed)
Attempted several times this week to connect with patient and follow-up on how he is doing and answer any questions he may have regarding medications. Unable to reach. Voicemails left.  ?

## 2022-03-11 ENCOUNTER — Other Ambulatory Visit (HOSPITAL_BASED_OUTPATIENT_CLINIC_OR_DEPARTMENT_OTHER): Payer: Medicaid Other | Admitting: Nurse Practitioner

## 2022-03-11 DIAGNOSIS — G893 Neoplasm related pain (acute) (chronic): Secondary | ICD-10-CM

## 2022-03-11 DIAGNOSIS — M792 Neuralgia and neuritis, unspecified: Secondary | ICD-10-CM

## 2022-03-11 MED ORDER — MORPHINE SULFATE ER 60 MG PO TBCR: 60.0000 mg | EXTENDED_RELEASE_TABLET | Freq: Two times a day (BID) | ORAL | 0 refills | Status: DC

## 2022-03-11 MED ORDER — OXYCODONE HCL 20 MG PO TABS: 20.0000 mg | ORAL_TABLET | Freq: Three times a day (TID) | ORAL | 0 refills | Status: DC | PRN

## 2022-03-11 NOTE — Telephone Encounter (Signed)
Spoke to Bradley Colon via phone. He reports he is feeling somewhat better however is still having challenges walking due to severe neuropathic pain. Due to this worsening renal disease his nephrologist recommended decreasing gabapentin dosing to 300 mg twice daily. Unfortunately this was working for him at higher doses and now the pain, numbness, and tingling has returned. He is unable to put a shoe on his right foot due to the discomfort.  ? ?Education provided on trying to treat his pain while also not causing any additional complications to his kidneys. He expressed understanding but with concern regarding his poor quality of life and not being able to do the things he wants and enjoys doing. Emotional support provided.  ? ?Discussed the use of capsaicin cream with hopes of offering some relief. He verbalized understanding and appreciation.  ? ? ?Visit consisted of counseling and education dealing with the complex and emotionally intense issues of symptom management and palliative care in the setting of serious and potentially life-threatening illness.Greater than 50%  of this time was spent counseling and coordinating care related to the above assessment and plan. ? ?Bradley Colon, AGPCNP-BC  ?[place] ? ? ?

## 2022-03-14 ENCOUNTER — Other Ambulatory Visit: Payer: Self-pay | Admitting: Family Medicine

## 2022-03-14 ENCOUNTER — Encounter: Payer: Self-pay | Admitting: Family Medicine

## 2022-03-18 ENCOUNTER — Encounter: Payer: Self-pay | Admitting: Nurse Practitioner

## 2022-03-18 ENCOUNTER — Inpatient Hospital Stay: Payer: Medicaid Other | Attending: Nurse Practitioner | Admitting: Nurse Practitioner

## 2022-03-18 ENCOUNTER — Other Ambulatory Visit: Payer: Self-pay

## 2022-03-18 VITALS — BP 124/91 | HR 130 | Temp 98.4°F | Resp 17 | Wt 156.6 lb

## 2022-03-18 DIAGNOSIS — I10 Essential (primary) hypertension: Secondary | ICD-10-CM | POA: Diagnosis not present

## 2022-03-18 DIAGNOSIS — Z87891 Personal history of nicotine dependence: Secondary | ICD-10-CM | POA: Insufficient documentation

## 2022-03-18 DIAGNOSIS — K5900 Constipation, unspecified: Secondary | ICD-10-CM | POA: Insufficient documentation

## 2022-03-18 DIAGNOSIS — G893 Neoplasm related pain (acute) (chronic): Secondary | ICD-10-CM

## 2022-03-18 DIAGNOSIS — K6289 Other specified diseases of anus and rectum: Secondary | ICD-10-CM | POA: Insufficient documentation

## 2022-03-18 DIAGNOSIS — G8929 Other chronic pain: Secondary | ICD-10-CM | POA: Insufficient documentation

## 2022-03-18 DIAGNOSIS — Z91030 Bee allergy status: Secondary | ICD-10-CM | POA: Insufficient documentation

## 2022-03-18 DIAGNOSIS — K429 Umbilical hernia without obstruction or gangrene: Secondary | ICD-10-CM | POA: Diagnosis not present

## 2022-03-18 DIAGNOSIS — C787 Secondary malignant neoplasm of liver and intrahepatic bile duct: Secondary | ICD-10-CM | POA: Diagnosis not present

## 2022-03-18 DIAGNOSIS — Z8379 Family history of other diseases of the digestive system: Secondary | ICD-10-CM | POA: Diagnosis not present

## 2022-03-18 DIAGNOSIS — Z5986 Financial insecurity: Secondary | ICD-10-CM | POA: Insufficient documentation

## 2022-03-18 DIAGNOSIS — Z803 Family history of malignant neoplasm of breast: Secondary | ICD-10-CM | POA: Insufficient documentation

## 2022-03-18 DIAGNOSIS — Z515 Encounter for palliative care: Secondary | ICD-10-CM | POA: Diagnosis not present

## 2022-03-18 DIAGNOSIS — C20 Malignant neoplasm of rectum: Secondary | ICD-10-CM | POA: Diagnosis present

## 2022-03-18 DIAGNOSIS — Z8249 Family history of ischemic heart disease and other diseases of the circulatory system: Secondary | ICD-10-CM | POA: Insufficient documentation

## 2022-03-18 DIAGNOSIS — Z87442 Personal history of urinary calculi: Secondary | ICD-10-CM | POA: Diagnosis not present

## 2022-03-18 DIAGNOSIS — Z79899 Other long term (current) drug therapy: Secondary | ICD-10-CM | POA: Insufficient documentation

## 2022-03-18 DIAGNOSIS — Z833 Family history of diabetes mellitus: Secondary | ICD-10-CM | POA: Insufficient documentation

## 2022-03-18 DIAGNOSIS — C7800 Secondary malignant neoplasm of unspecified lung: Secondary | ICD-10-CM | POA: Insufficient documentation

## 2022-03-18 DIAGNOSIS — Z881 Allergy status to other antibiotic agents status: Secondary | ICD-10-CM | POA: Insufficient documentation

## 2022-03-18 DIAGNOSIS — Z923 Personal history of irradiation: Secondary | ICD-10-CM | POA: Diagnosis not present

## 2022-03-18 DIAGNOSIS — Z801 Family history of malignant neoplasm of trachea, bronchus and lung: Secondary | ICD-10-CM | POA: Diagnosis not present

## 2022-03-18 DIAGNOSIS — F1290 Cannabis use, unspecified, uncomplicated: Secondary | ICD-10-CM | POA: Diagnosis not present
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
## 2022-03-18 NOTE — Progress Notes (Signed)
RN provided education on incentive spirometer use, pt used 3x with RN in room, pt verbalized understanding and use at home. ?

## 2022-03-24 DIAGNOSIS — R339 Retention of urine, unspecified: Secondary | ICD-10-CM | POA: Diagnosis not present

## 2022-03-26 ENCOUNTER — Telehealth: Payer: Self-pay | Admitting: *Deleted

## 2022-03-26 NOTE — Telephone Encounter (Signed)
Dr. Benay Spice made aware by palliative team that Bradley Colon wishes to resume his oncology care locally with [facility]. Called and left voice mail requesting return call to determine if he wishes to return to see Dr. Benay Spice.  Also sent Mychart message.

## 2022-03-27 ENCOUNTER — Encounter: Payer: Self-pay | Admitting: *Deleted

## 2022-03-27 ENCOUNTER — Other Ambulatory Visit: Payer: Self-pay | Admitting: *Deleted

## 2022-03-27 DIAGNOSIS — C20 Malignant neoplasm of rectum: Secondary | ICD-10-CM

## 2022-03-27 NOTE — Progress Notes (Signed)
Patient reports he does not wish to return to Dr. Benay Spice and wants referral to Dr. Burr Medico at [facility] to continue his oncology care. He also does not wish to return to [facility]. Notified GI Navigator at [employer].

## 2022-03-28 ENCOUNTER — Telehealth: Payer: Self-pay | Admitting: *Deleted

## 2022-03-28 ENCOUNTER — Telehealth: Payer: Self-pay | Admitting: Hematology and Oncology

## 2022-03-28 ENCOUNTER — Other Ambulatory Visit: Payer: Self-pay | Admitting: Internal Medicine

## 2022-03-28 NOTE — Telephone Encounter (Signed)
.  Called patient to schedule appointment per 5/26 inbasket, patient is aware of date and time.

## 2022-03-28 NOTE — Telephone Encounter (Signed)
Scheduled appt per 5/26 secure chat from [facility]. Pearson Grippe spoke to pt about appt, he is aware of appt date and time.

## 2022-03-28 NOTE — Telephone Encounter (Signed)
Received call from pt asking if he needed to get iron supplements for his anemia. This pt is transferring to [place] from Dr. Benay Spice and will be seen here with Dr. Lorenso Courier for the 1st on 04/02/22. Also received call from pt's nephrologist asking [place] to manage pt's anemia.  TCT patient and spoke with him. Pt is very concerned about his anemia  (last hgb appears to be 8.7, last creatinine is 2.74 on 02/24/22 with K+ of 5.5)  He asked if he should get any iron supplements. Advised that his last iron studies did not show iron deficiency so advised that he does not need any oral iron at this time. Pt very anxious about his total health situation-cancer, kidneys, occasional SOB, anemia.  Advised that I have spoken to Dr. Lorenso Courier about these concerns and that we will do a full lab work up next week and exam by Dr. Lorenso Courier. This way we will be able to determine what is needed in his care. Pt states he feels better knowing that we will thoroughly review his case, do labs and figure things out for him.  Port flush with labs added to appts on 04/02/22

## 2022-04-02 ENCOUNTER — Ambulatory Visit: Payer: Medicaid Other | Admitting: Hematology and Oncology

## 2022-04-02 ENCOUNTER — Inpatient Hospital Stay: Payer: Medicaid Other

## 2022-04-02 ENCOUNTER — Other Ambulatory Visit: Payer: Self-pay | Admitting: Hematology and Oncology

## 2022-04-02 ENCOUNTER — Inpatient Hospital Stay (HOSPITAL_BASED_OUTPATIENT_CLINIC_OR_DEPARTMENT_OTHER): Payer: Medicaid Other | Admitting: Hematology and Oncology

## 2022-04-02 ENCOUNTER — Other Ambulatory Visit: Payer: Self-pay

## 2022-04-02 VITALS — BP 122/78 | HR 105 | Temp 98.3°F | Resp 15 | Ht 67.0 in | Wt 156.6 lb

## 2022-04-02 DIAGNOSIS — C7800 Secondary malignant neoplasm of unspecified lung: Secondary | ICD-10-CM

## 2022-04-02 DIAGNOSIS — Z95828 Presence of other vascular implants and grafts: Secondary | ICD-10-CM

## 2022-04-02 DIAGNOSIS — C20 Malignant neoplasm of rectum: Secondary | ICD-10-CM | POA: Diagnosis not present

## 2022-04-02 LAB — CBC WITH DIFFERENTIAL (CANCER CENTER ONLY)
Abs Immature Granulocytes: 0.06 10*3/uL (ref 0.00–0.07)
Basophils Absolute: 0.1 10*3/uL (ref 0.0–0.1)
Basophils Relative: 1 %
Eosinophils Absolute: 0.4 10*3/uL (ref 0.0–0.5)
Eosinophils Relative: 3 %
HCT: 22.8 % — ABNORMAL LOW (ref 39.0–52.0)
Hemoglobin: 7.7 g/dL — ABNORMAL LOW (ref 13.0–17.0)
Immature Granulocytes: 1 %
Lymphocytes Relative: 10 %
Lymphs Abs: 1.3 10*3/uL (ref 0.7–4.0)
MCH: 26.4 pg (ref 26.0–34.0)
MCHC: 33.8 g/dL (ref 30.0–36.0)
MCV: 78.1 fL — ABNORMAL LOW (ref 80.0–100.0)
Monocytes Absolute: 1.1 10*3/uL — ABNORMAL HIGH (ref 0.1–1.0)
Monocytes Relative: 9 %
Neutro Abs: 10.3 10*3/uL — ABNORMAL HIGH (ref 1.7–7.7)
Neutrophils Relative %: 76 %
Platelet Count: 584 10*3/uL — ABNORMAL HIGH (ref 150–400)
RBC: 2.92 MIL/uL — ABNORMAL LOW (ref 4.22–5.81)
RDW: 15.5 % (ref 11.5–15.5)
WBC Count: 13.1 10*3/uL — ABNORMAL HIGH (ref 4.0–10.5)
nRBC: 0 % (ref 0.0–0.2)

## 2022-04-02 LAB — CMP (CANCER CENTER ONLY)
ALT: 20 U/L (ref 0–44)
AST: 20 U/L (ref 15–41)
Albumin: 3 g/dL — ABNORMAL LOW (ref 3.5–5.0)
Alkaline Phosphatase: 157 U/L — ABNORMAL HIGH (ref 38–126)
Anion gap: 11 (ref 5–15)
BUN: 71 mg/dL — ABNORMAL HIGH (ref 6–20)
CO2: 24 mmol/L (ref 22–32)
Calcium: 9.7 mg/dL (ref 8.9–10.3)
Chloride: 98 mmol/L (ref 98–111)
Creatinine: 4.3 mg/dL (ref 0.60–1.20)
GFR, Estimated: 16 mL/min — ABNORMAL LOW (ref >60)
Glucose, Bld: 164 mg/dL — ABNORMAL HIGH (ref 70–99)
Potassium: 4.2 mmol/L (ref 3.5–5.1)
Sodium: 133 mmol/L — ABNORMAL LOW (ref 135–145)
Total Bilirubin: 0.6 mg/dL (ref 0.3–1.2)
Total Protein: 8.3 g/dL — ABNORMAL HIGH (ref 6.5–8.1)

## 2022-04-02 LAB — IRON AND IRON BINDING CAPACITY (CC-WL,HP ONLY)
Iron: 37 ug/dL — ABNORMAL LOW (ref 45–182)
Saturation Ratios: 14 % — ABNORMAL LOW (ref 17.9–39.5)
TIBC: 265 ug/dL (ref 250–450)
UIBC: 228 ug/dL

## 2022-04-02 LAB — RETIC PANEL
Immature Retic Fract: 18 % — ABNORMAL HIGH (ref 2.3–15.9)
RBC.: 2.88 MIL/uL — ABNORMAL LOW (ref 4.22–5.81)
Retic Count, Absolute: 32 10*3/uL (ref 19.0–186.0)
Retic Ct Pct: 1.1 % (ref 0.4–3.1)
Reticulocyte Hemoglobin: 23.2 pg — ABNORMAL LOW (ref >27.9)

## 2022-04-02 LAB — MAGNESIUM: Magnesium: 1.9 mg/dL (ref 1.7–2.4)

## 2022-04-02 MED ORDER — HEPARIN SOD (PORK) LOCK FLUSH 100 UNIT/ML IV SOLN
500.0000 [IU] | Freq: Once | INTRAVENOUS | Status: AC
  Administered 2022-04-02: 500 [IU]

## 2022-04-02 MED ORDER — SODIUM CHLORIDE 0.9% FLUSH
10.0000 mL | Freq: Once | INTRAVENOUS | Status: AC
  Administered 2022-04-02: 10 mL
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
## 2022-04-02 NOTE — Progress Notes (Signed)
Pt does self cath every 6 hours. Does c/o bladder spasms afterwards. Encouraged to talk to his urologist

## 2022-04-03 ENCOUNTER — Telehealth: Payer: Self-pay | Admitting: *Deleted

## 2022-04-03 LAB — CEA (IN HOUSE-CHCC): CEA (CHCC-In House): 120.57 ng/mL — ABNORMAL HIGH (ref 0.00–5.00)

## 2022-04-03 LAB — FERRITIN: Ferritin: 249 ng/mL (ref 24–336)

## 2022-04-03 NOTE — Telephone Encounter (Signed)
TCT patient regarding lab results from yesterday. Spoke with him. Advised that his kidney function has gotten worse since the last time they were checked. His creatinine went from 2.7 1 month ago to 4.30 yesterday. Also advised that his HGB is low @ 7.7, likely related to his decreased kidney function. Advised that his iron studies are fine so it is not an iron deficiency issue. Advised that he needs to contact his nephrologist. He voiced understanding and stated he would call today.

## 2022-04-03 NOTE — Telephone Encounter (Signed)
-----   Message from Orson Slick, MD sent at 04/03/2022  9:21 AM EDT ----- Please let [name] know that his Cr levels have increase to 4.3. He needs to discuss this recent increase with his nephrologist.   ----- Message ----- From: Buel Ream, Lab In [place] Sent: 04/02/2022   4:25 PM EDT To: Orson Slick, MD

## 2022-04-05 ENCOUNTER — Encounter: Payer: Self-pay | Admitting: Family

## 2022-04-06 ENCOUNTER — Encounter: Payer: Self-pay | Admitting: Oncology

## 2022-04-07 ENCOUNTER — Telehealth: Payer: Self-pay | Admitting: *Deleted

## 2022-04-07 ENCOUNTER — Telehealth: Payer: Self-pay | Admitting: Hematology and Oncology

## 2022-04-07 ENCOUNTER — Encounter: Payer: Self-pay | Admitting: Licensed Clinical Social Worker

## 2022-04-07 DIAGNOSIS — C20 Malignant neoplasm of rectum: Secondary | ICD-10-CM

## 2022-04-07 NOTE — Telephone Encounter (Signed)
Per 6/5 inbasket called and left message for pt about appointment.  Details and call back number left

## 2022-04-07 NOTE — Telephone Encounter (Signed)
TCT made to [place], Dr. Shane Crutch. Pt states he cannot get in to see his doctor for a month. Call made to see if that appt can get moved up. Pt's creatinine has almost doubled in 1 month. Pt is very anxious as he cannot have any chemotherapy with the current state of his kidneys. Message left for Sidonie Dickens, Dr. Teressa Lower nurse. Relayed the above information on her vm  and requested call back from her and to the patient.

## 2022-04-07 NOTE — Progress Notes (Signed)
[facility] CSW Progress Note  Clinical Education officer, museum contacted patient by phone to check in on pt following last meeting w/ oncologist.  Pt verbalized frustration regarding follow up w/ both urology and nephrology as he is extremely concerned about progression of cancer while waiting for a response from both specialists.  RN informed and call placed to [facility] relaying pt's concerns to advocate for a more timely response.  CSW provided pt w/ contact information and will remain available as appropriate to address concerns as they arise.     Henriette Combs, LCSW    Patient is participating in a Managed Medicaid Plan:  Yes

## 2022-04-08 ENCOUNTER — Encounter: Payer: Self-pay | Admitting: Hematology and Oncology

## 2022-04-08 ENCOUNTER — Telehealth: Payer: Self-pay

## 2022-04-08 NOTE — Telephone Encounter (Signed)
Called pt to discuss upcoming appts, LVM and callback number

## 2022-04-09 ENCOUNTER — Other Ambulatory Visit: Payer: Self-pay | Admitting: Nurse Practitioner

## 2022-04-09 MED ORDER — OXYCODONE HCL 20 MG PO TABS
20.0000 mg | ORAL_TABLET | Freq: Three times a day (TID) | ORAL | 0 refills | Status: DC | PRN
Start: 2022-04-10 — End: 2022-05-09

## 2022-04-09 MED ORDER — MORPHINE SULFATE ER 60 MG PO TBCR: 60.0000 mg | EXTENDED_RELEASE_TABLET | Freq: Two times a day (BID) | ORAL | 0 refills | Status: DC

## 2022-04-09 NOTE — Progress Notes (Deleted)
Patient ID: Bradley Colon, male    DOB: 12-12-1970  MRN: [id number]  CC: No chief complaint on file.   Subjective: Bradley Colon is a 51 y.o. male who presents for His concerns today include: ***   Hello I am Bradley Colon  mother and I have permission to contact you. I'm concerned about his ongoing issue with his urinary condition.He didn't tell  his urologist that he was having retention  This after straight cathing himself    He's had two infections over a month   His creatinine level high and he is trying to do as instructed. I'm also concerned about his bloodwork  His Hb is less than 3 and no one thought he may need blood??? I may not be addressing this to the correct doctor but you were in his file and I didn't see urologist that he had before  That  situation  was a disaster.  Please contact  him to let him know  how he should proceed .He has a large family who [place] supportive if he allows and it's hard. .Thank you for taking time out to read my message                                          Regards                                           Bradley Colon  Bradley Colon,   After review of his chart, it looks like the Urologist that he is followed by is Hillery Hunter, MD with [facility] phone: [phone] . You may want to contact them to advise of Bradley Colon urinary condition. If any questions or concerns, feel free to contact our office.      Oncology drew labs where hgb was low  Patient Active Problem List   Diagnosis Date Noted   Pressure injury of skin 12/28/2021   ARF (acute renal failure) ([place]) 12/27/2021   Hyponatremia 12/27/2021   Seizure ([place]) 12/27/2021   Essential hypertension 12/27/2021   Bacterial infection due to [place] 12/05/2021   Severe sepsis ([place]) due to UTI / pyelonephritis 12/04/2021   Hyperkalemia 12/04/2021   Acute kidney injury (AKI) with acute tubular necrosis (ATN) ([place]) 82/95/6213   Acute metabolic encephalopathy 08/65/7846   Grand mal  seizure ([place]) 12/04/2021   Acute bacterial pyelonephritis 96/29/5284   Metabolic acidosis 13/24/4010   Hypoglycemia 12/04/2021   Urinary incontinence 11/27/2021   Small bowel obstruction ([place]) 10/15/2021   Palliative care patient 10/11/2021   Lung nodules 07/10/2021   Goals of care, counseling/discussion 10/25/2020   Pelvic and perineal pain 09/12/2020   Post-procedural erectile dysfunction 04/25/2020   Port-A-Cath in place 04/12/2020   Hydrocele 04/11/2020   On antineoplastic chemotherapy 08/19/2019   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[place]) 03/02/2019   Reactive airway disease 03/02/2019   Vegetarian 03/02/2019   Adjustment disorder with mixed anxiety and depressed mood 03/02/2019   Chronic pain after traumatic injury 03/02/2019   Change in bowel habits 01/31/2019   Constipation 01/31/2019   Grade III hemorrhoids 01/31/2019   Rectal pain 01/31/2019   Fibromyalgia 12/18/2017   Dysesthesia affecting both sides of body 12/18/2017   Spondylosis without myelopathy or radiculopathy, lumbar  region 12/18/2017   Chronic post-traumatic stress disorder (PTSD) 05/25/2017   Other fatigue 07/27/2016   Chronic left shoulder pain 07/22/2016   Chronic neck pain 11/19/2015   Chronic low back pain 09/18/2014     Current Outpatient Medications on File Prior to Visit  Medication Sig Dispense Refill   acetaminophen (TYLENOL) 500 MG tablet Take by mouth.     albuterol (VENTOLIN HFA) 108 (90 Base) MCG/ACT inhaler INHALE 2 PUFFS INTO THE LUNGS EVERY 6 (SIX) HOURS AS NEEDED FOR WHEEZING OR SHORTNESS OF BREATH. 8.5 g 1   ciprofloxacin (CIPRO) 250 MG tablet Take 250 mg by mouth 2 (two) times daily.     furosemide (LASIX) 40 MG tablet 1 TAB BY MOUTH DAILY, PLEASE TAKE 1 TABLET DAILY FOR LOWER EXTREMITY SWELLING.     gabapentin (NEURONTIN) 300 MG capsule Take 2 capsules (600 mg total) by mouth 2 (two) times daily. 60 capsule 1   loratadine (CLARITIN) 10 MG tablet Take by mouth.     morphine (MS  CONTIN) 60 MG 12 hr tablet Take 1 tablet by mouth every 12 hours. 60 tablet 0   Naloxegol Oxalate (MOVANTIK PO) Take 1 tablet by mouth daily as needed (constipation). Unsure of mg (Patient not taking: Reported on 04/02/2022)     Nutritional Supplements (ENSURE PLANT-BASED PROTEIN) LIQD Take 1 Can by mouth 2 (two) times daily.     Oxycodone HCl 20 MG TABS Take 1 tablet by mouth 3 times daily as needed for severe pain. 90 tablet 0   tamsulosin (FLOMAX) 0.4 MG CAPS capsule TAKE 2 CAPSULES EVERY DAY 180 capsule 1   Turmeric 400 MG CAPS Take by mouth.     No current facility-administered medications on file prior to visit.    Allergies  Allergen Reactions   Bee Venom Anaphylaxis and Swelling    Pt has been stung by honey bees since he became a vegetarian at age 71 yrs old with no reaction.   Other Shortness Of Breath and Itching    Reaction to cut grass, pet dander   Contrast Media [Iodinated Contrast Media] Hives   Shrimp [Shellfish Allergy] Swelling and Other (See Comments)    Muscle cramps   Vancomycin Itching    Reaction to IV - pt states no reaction if it is infused slowly   Wheat Bran Swelling    Some swelling   Latex Hives and Rash    Reaction to fumes from latex paint    Social History   Socioeconomic History   Marital status: Married    Spouse name: Not on file   Number of children: Not on file   Years of education: Not on file   Highest education level: Not on file  Occupational History   Not on file  Tobacco Use   Smoking status: Former    Types: Cigars   Smokeless tobacco: Never  Vaping Use   Vaping Use: Former   Substances: CBD  Substance and Sexual Activity   Alcohol use: No    Comment:     Drug use: Yes    Types: Marijuana    Comment: States marijuana at 12 midnight, not smoking it now (as of 07/2021) now using edible   Sexual activity: Not on file  Other Topics Concern   Not on file  Social History Narrative   Lives with partner   Has 2 sons with autism    1 daughter with ADD   Vegetarian   Social Determinants of Health   Financial  Resource Strain: High Risk   Difficulty of Paying Living Expenses: Very hard  Food Insecurity: Not on file  Transportation Needs: Not on file  Physical Activity: Not on file  Stress: Stress Concern Present   Feeling of Stress : Very much  Social Connections: Not on file  Intimate Partner Violence: Not on file    Family History  Problem Relation Age of Onset   Hypertension Maternal Grandmother    Diabetes Maternal Grandmother    Lung cancer Maternal Grandfather    Breast cancer Paternal Grandmother    Colon cancer Neg Hx    Esophageal cancer Neg Hx    Rectal cancer Neg Hx    Stomach cancer Neg Hx    Pancreatic cancer Neg Hx    Liver disease Neg Hx    Inflammatory bowel disease Neg Hx     Past Surgical History:  Procedure Laterality Date   BRONCHIAL BIOPSY  07/18/2021   Procedure: BRONCHIAL BIOPSIES;  Surgeon: Garner Nash, DO;  Location: [facility];  Service: Pulmonary;;   BRONCHIAL BRUSHINGS  07/18/2021   Procedure: BRONCHIAL BRUSHINGS;  Surgeon: Garner Nash, DO;  Location: [facility];  Service: Pulmonary;;   BRONCHIAL NEEDLE ASPIRATION BIOPSY  07/18/2021   Procedure: BRONCHIAL NEEDLE ASPIRATION BIOPSIES;  Surgeon: Garner Nash, DO;  Location: [facility];  Service: Pulmonary;;   COLONOSCOPY     EPIDURAL BLOCK INJECTION     ILEOSTOMY REVISION  06/2020   laproscopic anterior resection for rectal cancer   11/18/2019   SHOULDER SURGERY     VIDEO BRONCHOSCOPY WITH ENDOBRONCHIAL NAVIGATION Bilateral 07/18/2021   Procedure: VIDEO BRONCHOSCOPY WITH ENDOBRONCHIAL NAVIGATION;  Surgeon: Garner Nash, DO;  Location: [facility];  Service: Pulmonary;  Laterality: Bilateral;  ION   VIDEO BRONCHOSCOPY WITH RADIAL ENDOBRONCHIAL ULTRASOUND  07/18/2021   Procedure: RADIAL ENDOBRONCHIAL ULTRASOUND;  Surgeon: Garner Nash, DO;  Location: [facility];  Service: Pulmonary;;     ROS: Review of Systems Negative except as stated above  PHYSICAL EXAM: There were no vitals taken for this visit.  Physical Exam  {male adult master:310786} {male adult master:310785}     Latest Ref Rng & Units 04/02/2022    4:03 PM 02/24/2022   12:00 AM 02/17/2022    2:32 PM  CMP  Glucose 70 - 99 mg/dL 164    136    BUN 6 - 20 mg/dL 71   40      64    Creatinine 0.60 - 1.20 mg/dL 4.30   2.7      5.30    Sodium 135 - 145 mmol/L 133   135      129    Potassium 3.5 - 5.1 mmol/L 4.2   5.5      4.5    Chloride 98 - 111 mmol/L 98   98      96    CO2 22 - 32 mmol/L 24   19      20     Calcium 8.9 - 10.3 mg/dL 9.7   10.0      9.9    Total Protein 6.5 - 8.1 g/dL 8.3    8.4    Total Bilirubin 0.3 - 1.2 mg/dL 0.6    0.9    Alkaline Phos 38 - 126 U/L 157    217    AST 15 - 41 U/L 20    23    ALT 0 - 44 U/L 20    25  This result is from an external source.   Lipid Panel     Component Value Date/Time   CHOL 104 09/18/2014 1733   TRIG 57 09/18/2014 1733   HDL 33 (L) 09/18/2014 1733   CHOLHDL 3.2 09/18/2014 1733   VLDL 11 09/18/2014 1733   LDLCALC 60 09/18/2014 1733    CBC    Component Value Date/Time   WBC 13.1 (H) 04/02/2022 1603   WBC 9.5 01/01/2022 0354   RBC 2.88 (L) 04/02/2022 1603   RBC 2.92 (L) 04/02/2022 1603   HGB 7.7 (L) 04/02/2022 1603   HGB 10.7 (L) 01/14/2022 1454   HCT 22.8 (L) 04/02/2022 1603   HCT 34.2 (L) 01/14/2022 1454   PLT 584 (H) 04/02/2022 1603   PLT 677 (H) 01/14/2022 1454   MCV 78.1 (L) 04/02/2022 1603   MCV 83 01/14/2022 1454   MCH 26.4 04/02/2022 1603   MCHC 33.8 04/02/2022 1603   RDW 15.5 04/02/2022 1603   RDW 14.4 01/14/2022 1454   LYMPHSABS 1.3 04/02/2022 1603   LYMPHSABS 2.2 01/14/2022 1454   MONOABS 1.1 (H) 04/02/2022 1603   EOSABS 0.4 04/02/2022 1603   EOSABS 0.6 (H) 01/14/2022 1454   BASOSABS 0.1 04/02/2022 1603   BASOSABS 0.1 01/14/2022 1454    ASSESSMENT AND PLAN:  There are no diagnoses linked to this  encounter.   Patient was given the opportunity to ask questions.  Patient verbalized understanding of the plan and was able to repeat key elements of the plan. Patient was given clear instructions to go to Emergency Department or return to medical center if symptoms don't improve, worsen, or new problems develop.The patient verbalized understanding.   No orders of the defined types were placed in this encounter.    Requested Prescriptions    No prescriptions requested or ordered in this encounter    No follow-ups on file.  Camillia Herter, NP

## 2022-04-10 ENCOUNTER — Telehealth: Payer: Self-pay

## 2022-04-10 ENCOUNTER — Ambulatory Visit: Payer: Medicaid Other | Admitting: Family

## 2022-04-10 NOTE — Telephone Encounter (Signed)
RN returned pt call and informed him that his prescriptions have been called in and that when/if he travels his prescriptions can be sent to a pharmacy near where he has traveled to. Pt reports that he plans on traveling [place] to find a second opinion. Pt verbalized understanding to call us when he needs a refill pt given call back number

## 2022-04-14 ENCOUNTER — Ambulatory Visit: Payer: Self-pay | Admitting: *Deleted

## 2022-04-14 ENCOUNTER — Telehealth: Payer: Self-pay

## 2022-04-14 NOTE — Telephone Encounter (Signed)
Per agent: "  Pt called [facility] to speak with Butch Penny about helping him with an authorization to see an out of state doctor for his cancer/ he states his numbers for his kidneys are extremely low and his cancer is growing while [employer] are doing nothing / he stated this is a life or death situation and declined speaking to a nurse stating it is nothing a nurse can do and that he needs to speak with Butch Penny urgently / he was working with her and the out of state provider and now he cant seem to get any word from Butch Penny about the authorization / please advise if needed"      Pt declines triage, speaking to NT. Please advise. Reason for Disposition  Non-urgent call redirected to PCP's office because it is open    Pt states urgent  Protocols used: No Contact or Duplicate Contact Call-A-AH

## 2022-04-14 NOTE — Telephone Encounter (Signed)
Pt called to request a lab appointment for his next apt, lab appt previously scheduled. Pt reported being short of breath and using his inhaler. Pt made aware of appointment in 2 days and reported we could talk about it then.  Pt also talked about upcoming appointments with other providers with [place]. All questions answered, no further needs at this time.

## 2022-04-15 ENCOUNTER — Ambulatory Visit: Payer: Self-pay | Admitting: *Deleted

## 2022-04-15 NOTE — Telephone Encounter (Signed)
Message from Bayard Beaver sent at 04/15/2022 12:45 PM EDT  Summary: insurance authorization   Pt called back about urgency and   speak ing with Butch Penny about helping him with an authorization to see an out of state doctor for his cancer/ he states his numbers for his kidneys are extremely low and his cancer is growing while [employer] are doing nothing / he stated this is a life or death situation and declined speaking to a nurse stating it is nothing a nurse can do and that he needs to speak with Butch Penny urgently / he was working with her and the out of state provider and now he cant seem to get any word from Butch Penny about the authorization / please advise if needed  Needs Butch Penny to f to contact [employer] and [employer] for authorization of service at  out of state Dr in [place] from [facility],   ----- Message from Sharene Skeans sent at 04/14/2022  8:30 AM EDT -----  Pt called [facility] to speak with Butch Penny about helping him with an authorization to see an out of state doctor for his cancer/ he states his numbers for his kidneys are extremely low and his cancer is growing while [employer] are doing nothing / he stated this is a life or death situation and declined speaking to a nurse stating it is nothing a nurse can do and that he needs to speak with Butch Penny urgently / he was working with her and the out of state provider and now he cant seem to get any word from [facility] about the authorization / please advise if needed          Reason for Disposition  [1] Follow-up call to recent contact AND [2] information only call, no triage required  Answer Assessment - Initial Assessment Questions 1. REASON FOR CALL or QUESTION: "What is your reason for calling today?" or "How can I best help you?" or "What question do you have that I can help answer?"     See phone call note from pt in documentation.   The message is for Dr. Dorna Mai at [facility]  Protocols used: Information Only Call - No Triage-A-AH

## 2022-04-16 ENCOUNTER — Encounter: Payer: Self-pay | Admitting: Nurse Practitioner

## 2022-04-16 ENCOUNTER — Other Ambulatory Visit: Payer: Self-pay

## 2022-04-16 ENCOUNTER — Inpatient Hospital Stay: Payer: Medicaid Other

## 2022-04-16 ENCOUNTER — Inpatient Hospital Stay: Payer: Medicaid Other | Attending: Nurse Practitioner | Admitting: Nurse Practitioner

## 2022-04-16 ENCOUNTER — Ambulatory Visit: Payer: Medicaid Other | Admitting: Family

## 2022-04-16 VITALS — BP 126/84 | HR 107 | Temp 98.2°F | Resp 18 | Ht 67.0 in | Wt 150.0 lb

## 2022-04-16 DIAGNOSIS — C7800 Secondary malignant neoplasm of unspecified lung: Secondary | ICD-10-CM

## 2022-04-16 DIAGNOSIS — G893 Neoplasm related pain (acute) (chronic): Secondary | ICD-10-CM | POA: Insufficient documentation

## 2022-04-16 DIAGNOSIS — Z515 Encounter for palliative care: Secondary | ICD-10-CM | POA: Insufficient documentation

## 2022-04-16 DIAGNOSIS — C20 Malignant neoplasm of rectum: Secondary | ICD-10-CM | POA: Diagnosis not present

## 2022-04-16 DIAGNOSIS — G629 Polyneuropathy, unspecified: Secondary | ICD-10-CM | POA: Insufficient documentation

## 2022-04-16 DIAGNOSIS — R530 Neoplastic (malignant) related fatigue: Secondary | ICD-10-CM

## 2022-04-16 DIAGNOSIS — K5900 Constipation, unspecified: Secondary | ICD-10-CM | POA: Insufficient documentation

## 2022-04-16 DIAGNOSIS — Z7189 Other specified counseling: Secondary | ICD-10-CM

## 2022-04-16 DIAGNOSIS — R634 Abnormal weight loss: Secondary | ICD-10-CM

## 2022-04-16 LAB — CBC WITH DIFFERENTIAL (CANCER CENTER ONLY)
Abs Immature Granulocytes: 0.08 10*3/uL — ABNORMAL HIGH (ref 0.00–0.07)
Basophils Absolute: 0.1 10*3/uL (ref 0.0–0.1)
Basophils Relative: 1 %
Eosinophils Absolute: 0.4 10*3/uL (ref 0.0–0.5)
Eosinophils Relative: 3 %
HCT: 26.9 % — ABNORMAL LOW (ref 39.0–52.0)
Hemoglobin: 8.7 g/dL — ABNORMAL LOW (ref 13.0–17.0)
Immature Granulocytes: 1 %
Lymphocytes Relative: 14 %
Lymphs Abs: 1.8 10*3/uL (ref 0.7–4.0)
MCH: 26.1 pg (ref 26.0–34.0)
MCHC: 32.3 g/dL (ref 30.0–36.0)
MCV: 80.8 fL (ref 80.0–100.0)
Monocytes Absolute: 1.1 10*3/uL — ABNORMAL HIGH (ref 0.1–1.0)
Monocytes Relative: 8 %
Neutro Abs: 9.7 10*3/uL — ABNORMAL HIGH (ref 1.7–7.7)
Neutrophils Relative %: 73 %
Platelet Count: 764 10*3/uL — ABNORMAL HIGH (ref 150–400)
RBC: 3.33 MIL/uL — ABNORMAL LOW (ref 4.22–5.81)
RDW: 16 % — ABNORMAL HIGH (ref 11.5–15.5)
WBC Count: 13.1 10*3/uL — ABNORMAL HIGH (ref 4.0–10.5)
nRBC: 0 % (ref 0.0–0.2)

## 2022-04-16 LAB — CMP (CANCER CENTER ONLY)
ALT: 17 U/L (ref 0–44)
AST: 26 U/L (ref 15–41)
Albumin: 3.3 g/dL — ABNORMAL LOW (ref 3.5–5.0)
Alkaline Phosphatase: 137 U/L — ABNORMAL HIGH (ref 38–126)
Anion gap: 12 (ref 5–15)
BUN: 50 mg/dL — ABNORMAL HIGH (ref 6–20)
CO2: 23 mmol/L (ref 22–32)
Calcium: 10.1 mg/dL (ref 8.9–10.3)
Chloride: 99 mmol/L (ref 98–111)
Creatinine: 3.17 mg/dL (ref 0.61–1.24)
GFR, Estimated: 23 mL/min — ABNORMAL LOW (ref >60)
Glucose, Bld: 123 mg/dL — ABNORMAL HIGH (ref 70–99)
Potassium: 5 mmol/L (ref 3.5–5.1)
Sodium: 134 mmol/L — ABNORMAL LOW (ref 135–145)
Total Bilirubin: 0.5 mg/dL (ref 0.3–1.2)
Total Protein: 9.1 g/dL — ABNORMAL HIGH (ref 6.5–8.1)

## 2022-04-16 NOTE — Progress Notes (Unsigned)
CRITICAL ALERT VALUE  CREAT: 3.17  CALLED TO READ BACK AND VERIFIED WITH Drucie Ip, RN @ 0569 ON 06.14.2023 BY Festus Holts
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
## 2022-04-17 LAB — CALCIUM, IONIZED: Calcium, Ionized, Serum: 5.6 mg/dL (ref 4.5–5.6)

## 2022-04-23 ENCOUNTER — Telehealth: Payer: Self-pay

## 2022-04-23 DIAGNOSIS — R339 Retention of urine, unspecified: Secondary | ICD-10-CM | POA: Diagnosis not present

## 2022-04-23 MED ORDER — GABAPENTIN 300 MG PO CAPS: 300.0000 mg | ORAL_CAPSULE | Freq: Two times a day (BID) | ORAL | 1 refills | Status: DC

## 2022-04-23 NOTE — Telephone Encounter (Signed)
Confirmed with pharmacy pt flomax available for pick up , pt made aware.

## 2022-04-23 NOTE — Telephone Encounter (Signed)
Pt called describing an increase in inhaler use for his SOB, and increase in fatigue, and a decrease in appetite and weight.  Pt reports needing a refill of gabapentin for pain, refill sent. Pt was informed that he needed to contact his kidney doctor for these symptoms, as they pertain to his kidney function. RN educated pt on palliative care for pain management, pt verbalized understanding. Pt verbalized that he will contact kidney doctor or PCP. No further needs.

## 2022-04-24 ENCOUNTER — Telehealth: Payer: Self-pay

## 2022-04-24 NOTE — Telephone Encounter (Signed)
Regarding preauth

## 2022-04-25 ENCOUNTER — Telehealth: Payer: Self-pay

## 2022-04-25 ENCOUNTER — Telehealth: Payer: Self-pay | Admitting: Family Medicine

## 2022-04-25 NOTE — Telephone Encounter (Signed)
Patient checking status of authorization of service  Patient states it is very important to start his treatment because his sob and cancer is progressing   Patient declined being transferred to triage for sob  Please follow up w/ patient

## 2022-04-25 NOTE — Telephone Encounter (Signed)
After speaking with pt and advising him that Dr. Andrey Campanile didn't feel that she should be the one to do the Out-of-State Authz. To the hospital in [place], I called [facility] and spoke w/Jennifer [phone].Marland Kitchen She took all of the info provided from [place] at the [facility] in [place], re: codes, etc. I explained to her that we are being told that the PCP needs to request the out-of-state Bradley Colon, even though we did not refer the patient to the [facility], nor did the patients Specialist. All of this has been arranged by the patient.  Victorino Dike advised that if we, the PCP had referred the patient, then we would need to do the authz request. She did say that the Oncologist could do it, as well as the Hospital can request it from their end. I will call Judie Grieve at the [facility] on Monday, June 26 to see if they will request it from their end. I will also let the patient know what I was told by [facility]. Call Ref# [id number].

## 2022-05-01 ENCOUNTER — Telehealth: Payer: Self-pay

## 2022-05-01 ENCOUNTER — Telehealth: Payer: Self-pay | Admitting: Hematology and Oncology

## 2022-05-01 NOTE — Telephone Encounter (Signed)
Patient called to ask if our facility could "authorize his insurance to pay for visit to [facility]." Explained that [facility] cannot do that.

## 2022-05-01 NOTE — Telephone Encounter (Signed)
.  Called patient to schedule appointment per 6/29 inbasket, patient is aware of date and time.   

## 2022-05-02 ENCOUNTER — Other Ambulatory Visit: Payer: Self-pay

## 2022-05-02 ENCOUNTER — Telehealth: Payer: Self-pay

## 2022-05-02 MED ORDER — VENTOLIN HFA 108 (90 BASE) MCG/ACT IN AERS: 2.0000 | INHALATION_SPRAY | Freq: Four times a day (QID) | RESPIRATORY_TRACT | 2 refills | Status: AC | PRN

## 2022-05-02 NOTE — Telephone Encounter (Signed)
Pt lVM requesting a refill on his inhaler that was previously prescribed by his oncologist. Refill sent to preferred pharmacy, called pt LVM.

## 2022-05-05 ENCOUNTER — Ambulatory Visit: Payer: Medicaid Other | Admitting: Hematology and Oncology

## 2022-05-05 ENCOUNTER — Inpatient Hospital Stay: Payer: Medicaid Other

## 2022-05-08 ENCOUNTER — Telehealth: Payer: Self-pay | Admitting: *Deleted

## 2022-05-08 NOTE — Telephone Encounter (Signed)
Fax'd office notes from Dr. Roma Schanz, NP in [place] and most recent discharge summary to [facility] @ [phone] at the request of [place] @ [facility] in [place]. Pt is independently seeking another opinion on his cancer care there

## 2022-05-09 ENCOUNTER — Other Ambulatory Visit: Payer: Self-pay | Admitting: Nurse Practitioner

## 2022-05-09 ENCOUNTER — Telehealth: Payer: Self-pay

## 2022-05-09 MED ORDER — MORPHINE SULFATE ER 60 MG PO TBCR: 60.0000 mg | EXTENDED_RELEASE_TABLET | Freq: Two times a day (BID) | ORAL | 0 refills | Status: DC

## 2022-05-09 MED ORDER — OXYCODONE HCL 20 MG PO TABS: 20.0000 mg | ORAL_TABLET | Freq: Three times a day (TID) | ORAL | 0 refills | Status: DC | PRN

## 2022-05-09 NOTE — Telephone Encounter (Signed)
Called pt to notify him of refill sent in. Pt made RN aware that he is having increased trouble breathing. He reports that his inhaler still helps but it is worse than before. Pt reported that he felt that she should go to the ED, this RN agreed that if he felt so poorly that he needed to go, then he should go to the ED. Pt verbalized understanding, no further needs.

## 2022-05-10 ENCOUNTER — Inpatient Hospital Stay (HOSPITAL_COMMUNITY): Payer: Medicaid Other

## 2022-05-10 ENCOUNTER — Other Ambulatory Visit: Payer: Self-pay

## 2022-05-10 ENCOUNTER — Emergency Department (HOSPITAL_COMMUNITY): Payer: Medicaid Other

## 2022-05-10 ENCOUNTER — Inpatient Hospital Stay (HOSPITAL_COMMUNITY)
Admission: EM | Admit: 2022-05-10 | Discharge: 2022-05-13 | DRG: 193 | Disposition: A | Discharge status: Home / Self Care | Payer: Medicaid Other | Attending: Internal Medicine | Admitting: Internal Medicine

## 2022-05-10 ENCOUNTER — Encounter (HOSPITAL_COMMUNITY): Payer: Self-pay | Admitting: Emergency Medicine

## 2022-05-10 DIAGNOSIS — J189 Pneumonia, unspecified organism: Principal | ICD-10-CM | POA: Diagnosis present

## 2022-05-10 DIAGNOSIS — F4310 Post-traumatic stress disorder, unspecified: Secondary | ICD-10-CM | POA: Diagnosis not present

## 2022-05-10 DIAGNOSIS — M5412 Radiculopathy, cervical region: Secondary | ICD-10-CM | POA: Diagnosis not present

## 2022-05-10 DIAGNOSIS — Z8249 Family history of ischemic heart disease and other diseases of the circulatory system: Secondary | ICD-10-CM | POA: Diagnosis not present

## 2022-05-10 DIAGNOSIS — Z91018 Allergy to other foods: Secondary | ICD-10-CM

## 2022-05-10 DIAGNOSIS — J45909 Unspecified asthma, uncomplicated: Secondary | ICD-10-CM | POA: Diagnosis present

## 2022-05-10 DIAGNOSIS — Z881 Allergy status to other antibiotic agents status: Secondary | ICD-10-CM | POA: Diagnosis not present

## 2022-05-10 DIAGNOSIS — Z91030 Bee allergy status: Secondary | ICD-10-CM

## 2022-05-10 DIAGNOSIS — C20 Malignant neoplasm of rectum: Secondary | ICD-10-CM | POA: Diagnosis present

## 2022-05-10 DIAGNOSIS — Z91041 Radiographic dye allergy status: Secondary | ICD-10-CM

## 2022-05-10 DIAGNOSIS — D638 Anemia in other chronic diseases classified elsewhere: Secondary | ICD-10-CM | POA: Diagnosis present

## 2022-05-10 DIAGNOSIS — E43 Unspecified severe protein-calorie malnutrition: Secondary | ICD-10-CM | POA: Diagnosis present

## 2022-05-10 DIAGNOSIS — R0602 Shortness of breath: Secondary | ICD-10-CM | POA: Diagnosis not present

## 2022-05-10 DIAGNOSIS — G893 Neoplasm related pain (acute) (chronic): Secondary | ICD-10-CM | POA: Diagnosis not present

## 2022-05-10 DIAGNOSIS — Z91013 Allergy to seafood: Secondary | ICD-10-CM

## 2022-05-10 DIAGNOSIS — N179 Acute kidney failure, unspecified: Secondary | ICD-10-CM | POA: Diagnosis present

## 2022-05-10 DIAGNOSIS — R918 Other nonspecific abnormal finding of lung field: Secondary | ICD-10-CM | POA: Diagnosis not present

## 2022-05-10 DIAGNOSIS — I129 Hypertensive chronic kidney disease with stage 1 through stage 4 chronic kidney disease, or unspecified chronic kidney disease: Secondary | ICD-10-CM | POA: Diagnosis not present

## 2022-05-10 DIAGNOSIS — R0600 Dyspnea, unspecified: Principal | ICD-10-CM

## 2022-05-10 DIAGNOSIS — C7800 Secondary malignant neoplasm of unspecified lung: Secondary | ICD-10-CM | POA: Diagnosis not present

## 2022-05-10 DIAGNOSIS — G894 Chronic pain syndrome: Secondary | ICD-10-CM | POA: Diagnosis present

## 2022-05-10 DIAGNOSIS — Z833 Family history of diabetes mellitus: Secondary | ICD-10-CM

## 2022-05-10 DIAGNOSIS — E871 Hypo-osmolality and hyponatremia: Secondary | ICD-10-CM | POA: Diagnosis present

## 2022-05-10 DIAGNOSIS — C787 Secondary malignant neoplasm of liver and intrahepatic bile duct: Secondary | ICD-10-CM | POA: Diagnosis not present

## 2022-05-10 DIAGNOSIS — Z85048 Personal history of other malignant neoplasm of rectum, rectosigmoid junction, and anus: Secondary | ICD-10-CM

## 2022-05-10 DIAGNOSIS — N319 Neuromuscular dysfunction of bladder, unspecified: Secondary | ICD-10-CM | POA: Diagnosis present

## 2022-05-10 DIAGNOSIS — Z91040 Latex allergy status: Secondary | ICD-10-CM

## 2022-05-10 DIAGNOSIS — Z803 Family history of malignant neoplasm of breast: Secondary | ICD-10-CM

## 2022-05-10 DIAGNOSIS — I2609 Other pulmonary embolism with acute cor pulmonale: Secondary | ICD-10-CM | POA: Diagnosis not present

## 2022-05-10 DIAGNOSIS — Z801 Family history of malignant neoplasm of trachea, bronchus and lung: Secondary | ICD-10-CM

## 2022-05-10 DIAGNOSIS — E875 Hyperkalemia: Secondary | ICD-10-CM | POA: Diagnosis present

## 2022-05-10 DIAGNOSIS — I2699 Other pulmonary embolism without acute cor pulmonale: Secondary | ICD-10-CM | POA: Diagnosis not present

## 2022-05-10 DIAGNOSIS — R64 Cachexia: Secondary | ICD-10-CM | POA: Diagnosis not present

## 2022-05-10 DIAGNOSIS — J9601 Acute respiratory failure with hypoxia: Secondary | ICD-10-CM | POA: Diagnosis present

## 2022-05-10 DIAGNOSIS — N184 Chronic kidney disease, stage 4 (severe): Secondary | ICD-10-CM | POA: Diagnosis not present

## 2022-05-10 DIAGNOSIS — N138 Other obstructive and reflux uropathy: Secondary | ICD-10-CM | POA: Diagnosis not present

## 2022-05-10 DIAGNOSIS — Z6824 Body mass index (BMI) 24.0-24.9, adult: Secondary | ICD-10-CM

## 2022-05-10 DIAGNOSIS — N170 Acute kidney failure with tubular necrosis: Secondary | ICD-10-CM | POA: Diagnosis not present

## 2022-05-10 DIAGNOSIS — Z87891 Personal history of nicotine dependence: Secondary | ICD-10-CM | POA: Diagnosis not present

## 2022-05-10 LAB — URINALYSIS, ROUTINE W REFLEX MICROSCOPIC
Bilirubin Urine: NEGATIVE
Glucose, UA: NEGATIVE mg/dL
Ketones, ur: NEGATIVE mg/dL
Nitrite: NEGATIVE
Protein, ur: 100 mg/dL — AB
Specific Gravity, Urine: 1.011 (ref 1.005–1.030)
WBC, UA: >50 WBC/hpf — ABNORMAL HIGH (ref 0–5)
pH: 5 (ref 5.0–8.0)

## 2022-05-10 LAB — TROPONIN I (HIGH SENSITIVITY)
Troponin I (High Sensitivity): 13 ng/L (ref <18)
Troponin I (High Sensitivity): 9 ng/L (ref <18)

## 2022-05-10 LAB — BASIC METABOLIC PANEL
Anion gap: 16 — ABNORMAL HIGH (ref 5–15)
BUN: 51 mg/dL — ABNORMAL HIGH (ref 6–20)
CO2: 20 mmol/L — ABNORMAL LOW (ref 22–32)
Calcium: 10.6 mg/dL — ABNORMAL HIGH (ref 8.9–10.3)
Chloride: 97 mmol/L — ABNORMAL LOW (ref 98–111)
Creatinine, Ser: 3.87 mg/dL — ABNORMAL HIGH (ref 0.61–1.24)
GFR, Estimated: 18 mL/min — ABNORMAL LOW (ref >60)
Glucose, Bld: 134 mg/dL — ABNORMAL HIGH (ref 70–99)
Potassium: 5 mmol/L (ref 3.5–5.1)
Sodium: 133 mmol/L — ABNORMAL LOW (ref 135–145)

## 2022-05-10 LAB — CBC WITH DIFFERENTIAL/PLATELET
Abs Immature Granulocytes: 0.17 10*3/uL — ABNORMAL HIGH (ref 0.00–0.07)
Basophils Absolute: 0.1 10*3/uL (ref 0.0–0.1)
Basophils Relative: 1 %
Eosinophils Absolute: 0.7 10*3/uL — ABNORMAL HIGH (ref 0.0–0.5)
Eosinophils Relative: 3 %
HCT: 29.5 % — ABNORMAL LOW (ref 39.0–52.0)
Hemoglobin: 9 g/dL — ABNORMAL LOW (ref 13.0–17.0)
Immature Granulocytes: 1 %
Lymphocytes Relative: 7 %
Lymphs Abs: 1.5 10*3/uL (ref 0.7–4.0)
MCH: 25.1 pg — ABNORMAL LOW (ref 26.0–34.0)
MCHC: 30.5 g/dL (ref 30.0–36.0)
MCV: 82.2 fL (ref 80.0–100.0)
Monocytes Absolute: 1.5 10*3/uL — ABNORMAL HIGH (ref 0.1–1.0)
Monocytes Relative: 7 %
Neutro Abs: 18.8 10*3/uL — ABNORMAL HIGH (ref 1.7–7.7)
Neutrophils Relative %: 81 %
Platelets: 644 10*3/uL — ABNORMAL HIGH (ref 150–400)
RBC: 3.59 MIL/uL — ABNORMAL LOW (ref 4.22–5.81)
RDW: 16.2 % — ABNORMAL HIGH (ref 11.5–15.5)
WBC: 22.8 10*3/uL — ABNORMAL HIGH (ref 4.0–10.5)
nRBC: 0 % (ref 0.0–0.2)

## 2022-05-10 LAB — BRAIN NATRIURETIC PEPTIDE: B Natriuretic Peptide: 75 pg/mL (ref 0.0–100.0)

## 2022-05-10 LAB — D-DIMER, QUANTITATIVE: D-Dimer, Quant: 12.6 ug/mL-FEU — ABNORMAL HIGH (ref 0.00–0.50)

## 2022-05-10 LAB — ABO/RH: ABO/RH(D): A POS

## 2022-05-10 LAB — TYPE AND SCREEN
ABO/RH(D): A POS
Antibody Screen: NEGATIVE

## 2022-05-10 MED ORDER — VANCOMYCIN HCL 1250 MG/250ML IV SOLN
1250.0000 mg | Freq: Once | INTRAVENOUS | Status: AC
  Administered 2022-05-11: 1250 mg via INTRAVENOUS
  Filled 2022-05-10: qty 250

## 2022-05-10 MED ORDER — SODIUM CHLORIDE 0.9 % IV SOLN
2.0000 g | Freq: Once | INTRAVENOUS | Status: AC
  Administered 2022-05-10: 2 g via INTRAVENOUS
  Filled 2022-05-10: qty 12.5

## 2022-05-10 MED ORDER — LACTATED RINGERS IV BOLUS
1000.0000 mL | Freq: Once | INTRAVENOUS | Status: AC
  Administered 2022-05-10: 1000 mL via INTRAVENOUS

## 2022-05-10 NOTE — ED Notes (Signed)
PT O2 saturation 89% RA. PT placed on 2LPM [place] and saturation improved to 96%

## 2022-05-10 NOTE — Assessment & Plan Note (Addendum)
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 -Bladder scan to assess for urinary retention -If retaining then place Foley catheter -If not retaining then placed on IV fluids -Monitor strict UOP -Abnormal urinalysis, follow urine culture -On empiric antibiotics as above -Continue Flomax

## 2022-05-10 NOTE — Assessment & Plan Note (Signed)
Continue home MS Contin 60 mg q12h and oxycodone 20 mg TID prn with hold parameters.  Continue bowel regiment.

## 2022-05-10 NOTE — Assessment & Plan Note (Addendum)
Complex history of metastatic rectal adenocarcinoma to the lungs and liver.  Not on chemotherapy since November 2022 due to multiple complication including renal dysfunction.  Following with oncology, Dr. Leonides Schanz.  Apparently patient is not candidate for further cancer related therapy. He has progressive disease and has a very poor prognosis. Follow up with palliative care. He would like to visit his sister in [place].

## 2022-05-10 NOTE — ED Triage Notes (Signed)
Patient reports worsening exertional dyspnea for with dry cough onset this week , history of rectal CA metastasis to lungs . No fever or chills .
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
## 2022-05-10 NOTE — Assessment & Plan Note (Signed)
Patient with worsening respiratory failure due to pulmonary metastasis, can not ruled out super imposed community acquired pneumonia (present on admission).  CT chest with no signs of pulmonary embolism but limited study. His prognosis is very poor considering progressive cancer.   Plan to continue supplemental 02 per [place] to keep 02 saturation 92% or greater.  Continue antibiotic therapy with ceftriaxone and azithromycin Bronchodilator therapy and airway clearing techniques Patient follows with palliative care as outpatient.  Pending V/Q scan to rule out PE.

## 2022-05-10 NOTE — Assessment & Plan Note (Signed)
Hemoglobin stable.  Continue to monitor.
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
## 2022-05-10 NOTE — ED Provider Triage Note (Signed)
Emergency Medicine Provider Triage Evaluation Note  Colbin Jovel , a 51 y.o. male  was evaluated in triage.  Pt complains of with shortness of breath.  He has a past medical history of metastatic lung cancer and chronic kidney disease.  Patient states that yesterday he leaned down to grab something off the floor and had a coughing fit and could not catch his breath.  He used his inhaler.  He had increasingly worsening shortness of breath was found to be hypoxic upon arrival with tachycardia.  Currently on home 2 L via nasal cannula.  History of right leg swelling but has had DVT studies in the past that of been negative.  Review of Systems  Positive: sob Negative: fever  Physical Exam  BP 118/81 (BP Location: Left Arm)   Pulse (!) 132   Temp 98.8 F (37.1 C) (Oral)   Resp 18   SpO2 96%  Gen:   Awake, no distress   Resp:  Normal effort  MSK:   Moves extremities without difficulty  Other:  Rhonchi that clear with cough  Medical Decision Making  Medically screening exam initiated at 7:36 PM.  Appropriate orders placed.  Izac Faulkenberry was informed that the remainder of the evaluation will be completed by another provider, this initial triage assessment does not replace that evaluation, and the importance of remaining in the ED until their evaluation is complete.  Work-up initiated   Margarita Mail, PA-C 05/10/22 1947

## 2022-05-11 DIAGNOSIS — J9601 Acute respiratory failure with hypoxia: Secondary | ICD-10-CM | POA: Diagnosis not present

## 2022-05-11 DIAGNOSIS — G893 Neoplasm related pain (acute) (chronic): Secondary | ICD-10-CM | POA: Diagnosis not present

## 2022-05-11 DIAGNOSIS — N319 Neuromuscular dysfunction of bladder, unspecified: Secondary | ICD-10-CM

## 2022-05-11 DIAGNOSIS — C7800 Secondary malignant neoplasm of unspecified lung: Secondary | ICD-10-CM | POA: Diagnosis not present

## 2022-05-11 DIAGNOSIS — C20 Malignant neoplasm of rectum: Secondary | ICD-10-CM | POA: Diagnosis not present

## 2022-05-11 DIAGNOSIS — D638 Anemia in other chronic diseases classified elsewhere: Secondary | ICD-10-CM

## 2022-05-11 DIAGNOSIS — N138 Other obstructive and reflux uropathy: Secondary | ICD-10-CM

## 2022-05-11 DIAGNOSIS — N170 Acute kidney failure with tubular necrosis: Secondary | ICD-10-CM | POA: Diagnosis not present

## 2022-05-11 LAB — CBC
HCT: 25.8 % — ABNORMAL LOW (ref 39.0–52.0)
Hemoglobin: 8 g/dL — ABNORMAL LOW (ref 13.0–17.0)
MCH: 25.6 pg — ABNORMAL LOW (ref 26.0–34.0)
MCHC: 31 g/dL (ref 30.0–36.0)
MCV: 82.4 fL (ref 80.0–100.0)
Platelets: 498 10*3/uL — ABNORMAL HIGH (ref 150–400)
RBC: 3.13 MIL/uL — ABNORMAL LOW (ref 4.22–5.81)
RDW: 16.4 % — ABNORMAL HIGH (ref 11.5–15.5)
WBC: 16 10*3/uL — ABNORMAL HIGH (ref 4.0–10.5)
nRBC: 0 % (ref 0.0–0.2)

## 2022-05-11 LAB — BASIC METABOLIC PANEL
Anion gap: 12 (ref 5–15)
BUN: 50 mg/dL — ABNORMAL HIGH (ref 6–20)
CO2: 21 mmol/L — ABNORMAL LOW (ref 22–32)
Calcium: 9.9 mg/dL (ref 8.9–10.3)
Chloride: 100 mmol/L (ref 98–111)
Creatinine, Ser: 3.66 mg/dL — ABNORMAL HIGH (ref 0.61–1.24)
GFR, Estimated: 19 mL/min — ABNORMAL LOW (ref >60)
Glucose, Bld: 89 mg/dL (ref 70–99)
Potassium: 5.2 mmol/L — ABNORMAL HIGH (ref 3.5–5.1)
Sodium: 133 mmol/L — ABNORMAL LOW (ref 135–145)

## 2022-05-11 LAB — PROCALCITONIN: Procalcitonin: 1.87 ng/mL

## 2022-05-11 MED ORDER — ONDANSETRON HCL 4 MG PO TABS: 4.0000 mg | ORAL_TABLET | Freq: Four times a day (QID) | ORAL | Status: DC | PRN

## 2022-05-11 MED ORDER — SODIUM CHLORIDE 0.9 % IV SOLN
500.0000 mg | INTRAVENOUS | Status: DC
  Administered 2022-05-11: 500 mg via INTRAVENOUS
  Filled 2022-05-11: qty 5

## 2022-05-11 MED ORDER — ALBUTEROL SULFATE (2.5 MG/3ML) 0.083% IN NEBU
2.5000 mg | INHALATION_SOLUTION | RESPIRATORY_TRACT | Status: DC | PRN
  Administered 2022-05-11 – 2022-05-12 (×2): 2.5 mg via RESPIRATORY_TRACT
  Filled 2022-05-11 (×2): qty 3

## 2022-05-11 MED ORDER — IPRATROPIUM-ALBUTEROL 0.5-2.5 (3) MG/3ML IN SOLN: 3.0000 mL | RESPIRATORY_TRACT | Status: DC | PRN

## 2022-05-11 MED ORDER — DEXTROSE-NACL 5-0.9 % IV SOLN: INTRAVENOUS | Status: DC

## 2022-05-11 MED ORDER — HEPARIN SODIUM (PORCINE) 5000 UNIT/ML IJ SOLN
5000.0000 [IU] | Freq: Three times a day (TID) | INTRAMUSCULAR | Status: DC
  Administered 2022-05-11 – 2022-05-12 (×4): 5000 [IU] via SUBCUTANEOUS
  Filled 2022-05-11 (×4): qty 1

## 2022-05-11 MED ORDER — SODIUM CHLORIDE 0.9% FLUSH
3.0000 mL | Freq: Two times a day (BID) | INTRAVENOUS | Status: DC
  Administered 2022-05-11 – 2022-05-13 (×5): 3 mL via INTRAVENOUS

## 2022-05-11 MED ORDER — GUAIFENESIN ER 600 MG PO TB12
600.0000 mg | ORAL_TABLET | Freq: Two times a day (BID) | ORAL | Status: DC
  Administered 2022-05-11 – 2022-05-13 (×7): 600 mg via ORAL
  Filled 2022-05-11 (×7): qty 1

## 2022-05-11 MED ORDER — ONDANSETRON HCL 4 MG/2ML IJ SOLN: 4.0000 mg | Freq: Four times a day (QID) | INTRAMUSCULAR | Status: DC | PRN

## 2022-05-11 MED ORDER — GABAPENTIN 300 MG PO CAPS
300.0000 mg | ORAL_CAPSULE | Freq: Two times a day (BID) | ORAL | Status: DC
  Administered 2022-05-11 – 2022-05-13 (×6): 300 mg via ORAL
  Filled 2022-05-11 (×6): qty 1

## 2022-05-11 MED ORDER — LACTATED RINGERS IV SOLN: INTRAVENOUS | Status: DC

## 2022-05-11 MED ORDER — SODIUM ZIRCONIUM CYCLOSILICATE 5 G PO PACK
5.0000 g | PACK | Freq: Two times a day (BID) | ORAL | Status: AC
  Administered 2022-05-11 (×2): 5 g via ORAL
  Filled 2022-05-11 (×3): qty 1

## 2022-05-11 MED ORDER — SODIUM CHLORIDE 0.9 % IV SOLN
2.0000 g | INTRAVENOUS | Status: DC
  Administered 2022-05-11 – 2022-05-12 (×2): 2 g via INTRAVENOUS
  Filled 2022-05-11 (×2): qty 20

## 2022-05-11 MED ORDER — IPRATROPIUM-ALBUTEROL 0.5-2.5 (3) MG/3ML IN SOLN
3.0000 mL | Freq: Two times a day (BID) | RESPIRATORY_TRACT | Status: DC
  Filled 2022-05-11: qty 3

## 2022-05-11 MED ORDER — IPRATROPIUM-ALBUTEROL 0.5-2.5 (3) MG/3ML IN SOLN
3.0000 mL | Freq: Four times a day (QID) | RESPIRATORY_TRACT | Status: DC
  Administered 2022-05-11 (×2): 3 mL via RESPIRATORY_TRACT
  Filled 2022-05-11 (×3): qty 3

## 2022-05-11 MED ORDER — ACETAMINOPHEN 325 MG PO TABS
650.0000 mg | ORAL_TABLET | Freq: Four times a day (QID) | ORAL | Status: DC | PRN
  Administered 2022-05-12: 650 mg via ORAL
  Filled 2022-05-11: qty 2

## 2022-05-11 MED ORDER — OXYCODONE HCL 5 MG PO TABS: 20.0000 mg | ORAL_TABLET | Freq: Three times a day (TID) | ORAL | Status: DC | PRN

## 2022-05-11 MED ORDER — SENNOSIDES-DOCUSATE SODIUM 8.6-50 MG PO TABS: 1.0000 | ORAL_TABLET | Freq: Every evening | ORAL | Status: DC | PRN

## 2022-05-11 MED ORDER — ACETAMINOPHEN 650 MG RE SUPP: 650.0000 mg | Freq: Four times a day (QID) | RECTAL | Status: DC | PRN

## 2022-05-11 MED ORDER — AZITHROMYCIN 500 MG PO TABS
500.0000 mg | ORAL_TABLET | Freq: Every day | ORAL | Status: DC
  Administered 2022-05-11: 500 mg via ORAL
  Filled 2022-05-11: qty 2
  Filled 2022-05-11: qty 1

## 2022-05-11 MED ORDER — MORPHINE SULFATE ER 30 MG PO TBCR
60.0000 mg | EXTENDED_RELEASE_TABLET | Freq: Two times a day (BID) | ORAL | Status: DC
  Administered 2022-05-11 – 2022-05-13 (×7): 60 mg via ORAL
  Filled 2022-05-11 (×7): qty 2

## 2022-05-11 MED ORDER — TAMSULOSIN HCL 0.4 MG PO CAPS
0.8000 mg | ORAL_CAPSULE | Freq: Every day | ORAL | Status: DC
  Administered 2022-05-11 – 2022-05-13 (×3): 0.8 mg via ORAL
  Filled 2022-05-11 (×4): qty 2

## 2022-05-11 MED ORDER — BISACODYL 5 MG PO TBEC: 5.0000 mg | DELAYED_RELEASE_TABLET | Freq: Every day | ORAL | Status: DC | PRN

## 2022-05-11 NOTE — Progress Notes (Signed)
Progress Note   Patient: Bradley Colon GGY:[id number] DOB: 1971/10/07 DOA: 05/10/2022     1 DOS: the patient was seen and examined on 05/11/2022   Brief hospital course: Bradley Colon was admitted to the hospital with the working diagnosis of acute hypoxemic respiratory failure.   51 y.o. 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 Reported 2 days of worsening dyspnea, associated with dry cough. Worse symptoms while outdoors in the heat. On his initial physical examination his blood pressure was 118/81, HR 132, RR 18 and 02 saturation 89%, lungs with distant breath sounds, faint rhonchi, no wheezing, heart with S1 and S2 present and rhythmic, abdomen not distended and no lower extremity edema.   NA 133, K 5,0 Cl 97 bicarbonate 20 glucose 134, bun 51 cr 3,87  Wbc 22,8 hgb 9,0 plt 644  SG 1,011 > 50 wbc, 100 protein.   Chest radiograph with multiple lung nodules bilaterally, predominant in the periphery.  CT chest with significant increase in both size and number of pulmonary metastatic lesions as well ad increase in hilar and mediastinal adenopathy compared to September 2022.  Progressive metastatic liver disease. Pulmonary artery as visualized within normal limits.   EKG 127 bpm, right axis deviation, normal intervals, sinus rhythm, with no significant ST segment and T wave changes.     Assessment and Plan: * Acute respiratory failure with hypoxia [facility]) Patient with worsening respiratory failure due to pulmonary metastasis, can not ruled out super imposed community acquired pneumonia (present on  admission).  CT chest with no signs of pulmonary embolism but limited study. His prognosis is very poor considering progressive cancer.   Plan to continue supplemental 02 per [place] to keep 02 saturation 92% or greater.  Continue antibiotic therapy with ceftriaxone and azithromycin Bronchodilator therapy and airway clearing techniques Patient follows with palliative care as outpatient.  Pending V/Q scan to rule out PE.   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 Hyperkalemia, hyponatremia.   Follow up renal function with serum cr at 3.6 with serum bicarbonate at 21 and K at 5,2 Na is 133 and Cl 100.  Plan to continue gently hydration with isotonic saline Continue with in and out bladder catheterization  Add sodium zirconium x2 and follow electrolytes in am.  Avoid hypotension and nephrotoxic medications.   Obstructive nephropathy due to neurogenic bladder Continue in and out catheterizations.   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  Not on chemotherapy since November 2022 due to multiple complication including renal dysfunction.  Following with oncology, Dr. Lorenso Courier.  Apparently patient is not candidate for further cancer related therapy. He has progressive disease and has a very poor prognosis. Follow up with palliative care. He would like to visit his sister in [place].   Anemia of chronic disease Hemoglobin stable.  Continue to monitor.  Cancer associated pain Continue home MS Contin 60 mg q12h and oxycodone 20 mg TID prn with hold parameters.  Continue bowel regimen.        Subjective: Patient with improvement in dyspnea but not back to  baseline, no chest pain, worse symptoms with movement.   Physical Exam: Vitals:   05/11/22 0537 05/11/22 0800 05/11/22 1000 05/11/22 1200  BP: 100/62 109/63 (!) 94/55 99/68   Pulse: 95 91 81 84  Resp: (!) 21 17 19 20   Temp:      TempSrc:      SpO2: 91% 94% 97% 94%  Weight:      Height:       Neurology awake and alert ENT with pallor Cardiovascular with S1 and S2 present and rhythmic, positive murmur at the apex 3/6 with no gallops Respiratory with no wheezing, positive rales bilaterally no rhonchi Abdomen not distended No lower extremity edema  Data Reviewed:    Family Communication: no family at the bedside   Disposition: Status is: Inpatient Remains inpatient appropriate because: IV antibiotic therapy and supplemental -2 per [place]   Planned Discharge Destination: Home      Author: Tawni Millers, MD 05/11/2022 1:22 PM  For on call review [email or web address].

## 2022-05-11 NOTE — Hospital Course (Addendum)
Briana Newman was admitted to the hospital with the working diagnosis of acute hypoxemic respiratory failure.   51 y.o. 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 Reported 2 days of worsening dyspnea, associated with dry cough. Worse symptoms while outdoors in the heat. On his initial physical examination his blood pressure was 118/81, HR 132, RR 18 and 02 saturation 89%, lungs with distant breath sounds, faint rhonchi, no wheezing, heart with S1 and S2 present and rhythmic, abdomen not distended and no lower extremity edema.   NA 133, K 5,0 Cl 97 bicarbonate 20 glucose 134, bun 51 cr 3,87  Wbc 22,8 hgb 9,0 plt 644  SG 1,011 > 50 wbc, 100 protein.   Chest radiograph with multiple lung nodules bilaterally, predominant in the periphery.  CT chest with significant increase in both size and number of pulmonary metastatic lesions as well ad increase in hilar and mediastinal adenopathy compared to September 2022.  Progressive metastatic liver disease. Pulmonary artery as visualized within normal limits.   EKG 127 bpm, right axis deviation, normal intervals, sinus rhythm, with no significant ST segment and T wave changes.

## 2022-05-11 NOTE — Assessment & Plan Note (Signed)
Continue in and out catheterizations.

## 2022-05-12 ENCOUNTER — Inpatient Hospital Stay (HOSPITAL_COMMUNITY): Payer: Medicaid Other

## 2022-05-12 ENCOUNTER — Telehealth: Payer: Self-pay | Admitting: Family Medicine

## 2022-05-12 ENCOUNTER — Telehealth: Payer: Self-pay

## 2022-05-12 ENCOUNTER — Other Ambulatory Visit (HOSPITAL_COMMUNITY): Payer: Self-pay

## 2022-05-12 ENCOUNTER — Inpatient Hospital Stay: Payer: Medicaid Other | Admitting: Hematology and Oncology

## 2022-05-12 DIAGNOSIS — C7800 Secondary malignant neoplasm of unspecified lung: Secondary | ICD-10-CM | POA: Diagnosis not present

## 2022-05-12 DIAGNOSIS — R0602 Shortness of breath: Secondary | ICD-10-CM | POA: Diagnosis not present

## 2022-05-12 DIAGNOSIS — D638 Anemia in other chronic diseases classified elsewhere: Secondary | ICD-10-CM | POA: Diagnosis not present

## 2022-05-12 DIAGNOSIS — C20 Malignant neoplasm of rectum: Secondary | ICD-10-CM | POA: Diagnosis not present

## 2022-05-12 DIAGNOSIS — G893 Neoplasm related pain (acute) (chronic): Secondary | ICD-10-CM | POA: Diagnosis not present

## 2022-05-12 DIAGNOSIS — I2699 Other pulmonary embolism without acute cor pulmonale: Secondary | ICD-10-CM

## 2022-05-12 DIAGNOSIS — R0902 Hypoxemia: Secondary | ICD-10-CM | POA: Diagnosis not present

## 2022-05-12 DIAGNOSIS — J9601 Acute respiratory failure with hypoxia: Secondary | ICD-10-CM | POA: Diagnosis not present

## 2022-05-12 DIAGNOSIS — R7989 Other specified abnormal findings of blood chemistry: Secondary | ICD-10-CM | POA: Diagnosis not present

## 2022-05-12 LAB — BASIC METABOLIC PANEL
Anion gap: 13 (ref 5–15)
BUN: 42 mg/dL — ABNORMAL HIGH (ref 6–20)
CO2: 21 mmol/L — ABNORMAL LOW (ref 22–32)
Calcium: 10 mg/dL (ref 8.9–10.3)
Chloride: 101 mmol/L (ref 98–111)
Creatinine, Ser: 3.37 mg/dL — ABNORMAL HIGH (ref 0.61–1.24)
GFR, Estimated: 21 mL/min — ABNORMAL LOW (ref >60)
Glucose, Bld: 99 mg/dL (ref 70–99)
Potassium: 4.5 mmol/L (ref 3.5–5.1)
Sodium: 135 mmol/L (ref 135–145)

## 2022-05-12 LAB — CBC WITH DIFFERENTIAL/PLATELET
Abs Immature Granulocytes: 0.05 10*3/uL (ref 0.00–0.07)
Basophils Absolute: 0.1 10*3/uL (ref 0.0–0.1)
Basophils Relative: 1 %
Eosinophils Absolute: 0.9 10*3/uL — ABNORMAL HIGH (ref 0.0–0.5)
Eosinophils Relative: 8 %
HCT: 23.8 % — ABNORMAL LOW (ref 39.0–52.0)
Hemoglobin: 7.3 g/dL — ABNORMAL LOW (ref 13.0–17.0)
Immature Granulocytes: 0 %
Lymphocytes Relative: 11 %
Lymphs Abs: 1.3 10*3/uL (ref 0.7–4.0)
MCH: 25.5 pg — ABNORMAL LOW (ref 26.0–34.0)
MCHC: 30.7 g/dL (ref 30.0–36.0)
MCV: 83.2 fL (ref 80.0–100.0)
Monocytes Absolute: 1.1 10*3/uL — ABNORMAL HIGH (ref 0.1–1.0)
Monocytes Relative: 10 %
Neutro Abs: 8.2 10*3/uL — ABNORMAL HIGH (ref 1.7–7.7)
Neutrophils Relative %: 70 %
Platelets: 520 10*3/uL — ABNORMAL HIGH (ref 150–400)
RBC: 2.86 MIL/uL — ABNORMAL LOW (ref 4.22–5.81)
RDW: 16.2 % — ABNORMAL HIGH (ref 11.5–15.5)
WBC: 11.7 10*3/uL — ABNORMAL HIGH (ref 4.0–10.5)
nRBC: 0 % (ref 0.0–0.2)

## 2022-05-12 LAB — GLUCOSE, CAPILLARY: Glucose-Capillary: 129 mg/dL — ABNORMAL HIGH (ref 70–99)

## 2022-05-12 MED ORDER — AMOXICILLIN-POT CLAVULANATE 500-125 MG PO TABS
1.0000 | ORAL_TABLET | Freq: Two times a day (BID) | ORAL | Status: DC
  Administered 2022-05-12 – 2022-05-13 (×4): 500 mg via ORAL
  Filled 2022-05-12 (×4): qty 1

## 2022-05-12 MED ORDER — ENOXAPARIN SODIUM 80 MG/0.8ML IJ SOSY
1.0000 mg/kg | PREFILLED_SYRINGE | INTRAMUSCULAR | Status: DC
  Administered 2022-05-12 – 2022-05-13 (×2): 72.5 mg via SUBCUTANEOUS
  Filled 2022-05-12 (×2): qty 0.8

## 2022-05-12 MED ORDER — TECHNETIUM TO 99M ALBUMIN AGGREGATED
4.3000 | Freq: Once | INTRAVENOUS | Status: AC | PRN
  Administered 2022-05-12: 4.3 via INTRAVENOUS

## 2022-05-12 NOTE — Telephone Encounter (Signed)
Bradley Colon Case manager at [facility]  Pt has terminal cancer and wants to go to [facility] for care.  He is going currently to [facility] but there is nothing else they can do.  She wants to know how she help him navigate his care.  CB# [phone]

## 2022-05-12 NOTE — Progress Notes (Signed)
PROGRESS NOTE        PATIENT DETAILS Name: Bradley Colon Age: 51 y.o. Sex: male Date of Birth: 1971/05/22 Admit Date: 05/10/2022 Admitting Physician Lenore Cordia, MD JJO:[id number], [name], MD  Brief Summary: Patient is a 51 y.o.  male metastatic rectal adenocarcinoma-no longer candidate for chemotherapy due to CKD-presented with shortness of breath-and admitted to the hospitalist service for further evaluation and treatment.  Significant events: 7/8>> presented with shortness of breath-significant parenchymal metastatic disease on CT x-ray-admit to [facility].  Significant studies: 7/8>> CT chest: Significant increase in size/number of pulmonary metastatic lesions, increasing hilar/mediastinal lymphadenopathy.  Progressive hepatic metastatic disease. 7/10>> VQ scan: High probability  Significant microbiology data: 7/8>> blood culture: Negative 7/8>> urine culture: Gram-negative rods  Procedures:   Consults: Palliative care.  Subjective: Has a multitude of complaints-mostly against his outpatient oncologist/PCP-wants to know if I could start him on immunotherapy in the hospital.  Main complaint that brought him to the hospital is exertional dyspnea.  Objective: Vitals: Blood pressure 106/70, pulse 90, temperature 98 F (36.7 C), temperature source Oral, resp. rate 20, height 5\' 7"  (1.702 m), weight 71.4 kg, SpO2 97 %.   Exam: Gen Exam:Alert awake-not in any distress.  Looks cachectic. HEENT:atraumatic, normocephalic Chest: B/L clear to auscultation anteriorly CVS:S1S2 regular Abdomen:soft non tender, non distended Extremities:+ edema Neurology: Non focal Skin: no rash  Pertinent Labs/Radiology:    Latest Ref Rng & Units 05/12/2022    2:01 AM 05/11/2022    4:43 AM 05/10/2022    8:06 PM  CBC  WBC 4.0 - 10.5 K/uL 11.7  16.0  22.8   Hemoglobin 13.0 - 17.0 g/dL 7.3  8.0  9.0   Hematocrit 39.0 - 52.0 % 23.8  25.8  29.5   Platelets 150 - 400 K/uL 520  498   644     Lab Results  Component Value Date   NA 135 05/12/2022   K 4.5 05/12/2022   CL 101 05/12/2022   CO2 21 (L) 05/12/2022      Assessment/Plan: Acute hypoxic respiratory failure: Multifactorial etiology-due to extensive pulmonary metastatic disease-possibly postobstructive pneumonia and pulmonary embolism.  Suspect will require home O2-on discharge.  Starting anticoagulation with therapeutic Lovenox-we will transition from IV antibiotics to Augmentin.    CKD stage IV-neurogenic bladder-self-catheterization at home: No evidence of AKI (ruled out)--creatinine close to baseline.  Continue self-catheterization here in the hospital.  Pulmonary embolism: VQ scan with high probability-checking echo and lower extremity Doppler.  Given malignancy-placing on therapeutic Lovenox per pharmacy.  Normocytic anemia: Due to a combination of CKD and underlying extensive malignancy.  Transfuse if hemoglobin less than 7.  Chronic pain syndrome: Continue usual narcotic regimen.  Metastatic rectal adenocarcinoma: Reviewed prior oncology note-not a candidate for chemotherapy.  Patient asking about immunotherapy-I told him that he will need to talk to his outpatient oncologist in this regard.  He apparently is trying to get himself referred to [facility]-Per chart review-his demographics/medical information has been faxed to [facility] [employer].  At patient's request-I have asked our social worker to evaluate to see if anything can be done to assist this patient.  In the [place] very poor overall prognosis-we will place palliative care consult-he does follow with palliative care at [facility].  Severe protein calorie malnutrition: In the context of advanced malignancy-we will get nutrition evaluation.  BMI: Estimated body mass  index is 24.65 kg/m as calculated from the following:   Height as of this encounter: 5\' 7"  (1.702 m).   Weight as of this encounter: 71.4 kg.    Code status:   Code Status: Full Code   DVT Prophylaxis: heparin injection 5,000 Units Start: 05/11/22 0600   Family Communication: Mother at bedside.   Disposition Plan: Status is: Inpatient Remains inpatient appropriate because: Ongoing hypoxia-due to PE/PNA/lung mets-not yet stable for discharge-probably requires another day or so in the hospital.   Planned Discharge Destination:Home   Diet: Diet Order             Diet regular Room service appropriate? Yes; Fluid consistency: Thin  Diet effective now                     Antimicrobial agents: Anti-infectives (From admission, onward)    Start     Dose/Rate Route Frequency Ordered Stop   05/11/22 1330  azithromycin (ZITHROMAX) tablet 500 mg        500 mg Oral Daily 05/11/22 1325     05/11/22 0800  cefTRIAXone (ROCEPHIN) 2 g in sodium chloride 0.9 % 100 mL IVPB        2 g 200 mL/hr over 30 Minutes Intravenous Every 24 hours 05/11/22 0017 05/16/22 0759   05/11/22 0400  azithromycin (ZITHROMAX) 500 mg in sodium chloride 0.9 % 250 mL IVPB  Status:  Discontinued        500 mg 250 mL/hr over 60 Minutes Intravenous Every 24 hours 05/11/22 0017 05/11/22 1325   05/10/22 2300  vancomycin (VANCOREADY) IVPB 1250 mg/250 mL        1,250 mg 83.3 mL/hr over 180 Minutes Intravenous  Once 05/10/22 2258 05/11/22 0406   05/10/22 2300  ceFEPIme (MAXIPIME) 2 g in sodium chloride 0.9 % 100 mL IVPB        2 g 200 mL/hr over 30 Minutes Intravenous  Once 05/10/22 2258 05/10/22 2354        MEDICATIONS: Scheduled Meds:  azithromycin  500 mg Oral Daily   gabapentin  300 mg Oral BID   guaiFENesin  600 mg Oral BID   heparin  5,000 Units Subcutaneous Q8H   morphine  60 mg Oral Q12H   sodium chloride flush  3 mL Intravenous Q12H   tamsulosin  0.8 mg Oral Daily   Continuous Infusions:  cefTRIAXone (ROCEPHIN)  IV 2 g (05/12/22 0904)   dextrose 5 % and 0.9% NaCl 75 mL/hr at 05/12/22 0307   PRN Meds:.acetaminophen **OR**  acetaminophen, albuterol, bisacodyl, ondansetron **OR** ondansetron (ZOFRAN) IV, oxyCODONE, senna-docusate   I have personally reviewed following labs and imaging studies  LABORATORY DATA: CBC: Recent Labs  Lab 05/10/22 2006 05/11/22 0443 05/12/22 0201  WBC 22.8* 16.0* 11.7*  NEUTROABS 18.8*  --  8.2*  HGB 9.0* 8.0* 7.3*  HCT 29.5* 25.8* 23.8*  MCV 82.2 82.4 83.2  PLT 644* 498* 520*    Basic Metabolic Panel: Recent Labs  Lab 05/10/22 2006 05/11/22 0443 05/12/22 0201  NA 133* 133* 135  K 5.0 5.2* 4.5  CL 97* 100 101  CO2 20* 21* 21*  GLUCOSE 134* 89 99  BUN 51* 50* 42*  CREATININE 3.87* 3.66* 3.37*  CALCIUM 10.6* 9.9 10.0    GFR: Estimated Creatinine Clearance: 24 mL/min (A) (by C-G formula based on SCr of 3.37 mg/dL (H)).  Liver Function Tests: No results for input(s): "AST", "ALT", "ALKPHOS", "BILITOT", "PROT", "ALBUMIN" in the last 168 hours. No results  for input(s): "LIPASE", "AMYLASE" in the last 168 hours. No results for input(s): "AMMONIA" in the last 168 hours.  Coagulation Profile: No results for input(s): "INR", "PROTIME" in the last 168 hours.  Cardiac Enzymes: No results for input(s): "CKTOTAL", "CKMB", "CKMBINDEX", "TROPONINI" in the last 168 hours.  BNP (last 3 results) No results for input(s): "PROBNP" in the last 8760 hours.  Lipid Profile: No results for input(s): "CHOL", "HDL", "LDLCALC", "TRIG", "CHOLHDL", "LDLDIRECT" in the last 72 hours.  Thyroid Function Tests: No results for input(s): "TSH", "T4TOTAL", "FREET4", "T3FREE", "THYROIDAB" in the last 72 hours.  Anemia Panel: No results for input(s): "VITAMINB12", "FOLATE", "FERRITIN", "TIBC", "IRON", "RETICCTPCT" in the last 72 hours.  Urine analysis:    Component Value Date/Time   COLORURINE YELLOW 05/10/2022 2223   APPEARANCEUR TURBID (A) 05/10/2022 2223   LABSPEC 1.011 05/10/2022 2223   PHURINE 5.0 05/10/2022 2223   GLUCOSEU NEGATIVE 05/10/2022 2223   HGBUR SMALL (A) 05/10/2022  2223   BILIRUBINUR NEGATIVE 05/10/2022 2223   KETONESUR NEGATIVE 05/10/2022 2223   PROTEINUR 100 (A) 05/10/2022 2223   UROBILINOGEN 0.2 01/09/2014 0008   NITRITE NEGATIVE 05/10/2022 2223   LEUKOCYTESUR LARGE (A) 05/10/2022 2223    Sepsis Labs: Lactic Acid, Venous    Component Value Date/Time   LATICACIDVEN 1.9 12/27/2021 2235    MICROBIOLOGY: Recent Results (from the past 240 hour(s))  Urine Culture     Status: Abnormal (Preliminary result)   Collection Time: 05/10/22  9:57 PM   Specimen: Urine, Catheterized  Result Value Ref Range Status   Specimen Description URINE, CATHETERIZED  Final   Special Requests NONE  Final   Culture (A)  Final    >=100,000 COLONIES/mL GRAM NEGATIVE RODS SUSCEPTIBILITIES TO FOLLOW CULTURE REINCUBATED FOR BETTER GROWTH Performed at [facility], [place] [address], [place], [place] [address]    Report Status PENDING  Incomplete  Blood culture (routine x 2)     Status: None (Preliminary result)   Collection Time: 05/10/22 10:14 PM   Specimen: BLOOD  Result Value Ref Range Status   Specimen Description BLOOD LEFT ANTECUBITAL  Final   Special Requests   Final    BOTTLES DRAWN AEROBIC AND ANAEROBIC Blood Culture adequate volume   Culture   Final    NO GROWTH 1 DAY Performed at [facility], [place] [address], [place], [place] [address]    Report Status PENDING  Incomplete  Blood culture (routine x 2)     Status: None (Preliminary result)   Collection Time: 05/10/22 10:19 PM   Specimen: BLOOD  Result Value Ref Range Status   Specimen Description BLOOD RIGHT ANTECUBITAL  Final   Special Requests   Final    BOTTLES DRAWN AEROBIC AND ANAEROBIC Blood Culture adequate volume   Culture   Final    NO GROWTH 1 DAY Performed at [facility], [place] [address], [place], [place] [address]    Report Status PENDING  Incomplete    RADIOLOGY STUDIES/RESULTS: NM Pulmonary Perfusion  Result Date: 05/12/2022 CLINICAL DATA:  Pulmonary embolism  (PE) suspected, positive D-dimer EXAM: NUCLEAR MEDICINE PERFUSION LUNG SCAN TECHNIQUE: Perfusion images were obtained in multiple projections after intravenous injection of radiopharmaceutical. Ventilation scans intentionally deferred if perfusion scan and chest x-ray adequate for interpretation during COVID 19 epidemic. RADIOPHARMACEUTICALS:  4.3 mCi Tc-53m MAA IV COMPARISON:  Chest x-ray 05/12/2022 FINDINGS: Heterogeneous distribution of radiotracer within both lung fields with multiple superimposed mismatched wedge-shaped segmental perfusion defects bilaterally. IMPRESSION: High probability  for pulmonary embolism. These results will be called to the ordering clinician or representative by the Radiologist Assistant, and communication documented in the PACS or [employer]. Electronically Signed   By: Davina Poke D.O.   On: 05/12/2022 11:04   DG CHEST PORT 1 VIEW  Result Date: 05/12/2022 CLINICAL DATA:  Shortness of breath, hypoxia, cancer EXAM: PORTABLE CHEST 1 VIEW COMPARISON:  Radiograph 05/10/2022 FINDINGS: Chest port catheter tip overlies the right atrium. Unchanged cardiomediastinal silhouette. There are innumerable pulmonary metastases as seen on prior exams. There is no definite new airspace disease. There are trace pleural effusions. No pneumothorax. No acute osseous abnormality. IMPRESSION: Extensive pulmonary metastases.  No definite new airspace disease. Electronically Signed   By: Maurine Simmering M.D.   On: 05/12/2022 08:33   CT Chest Wo Contrast  Result Date: 05/11/2022 CLINICAL DATA:  History of metastatic lung carcinoma EXAM: CT CHEST WITHOUT CONTRAST TECHNIQUE: Multidetector CT imaging of the chest was performed following the standard protocol without IV contrast. RADIATION DOSE REDUCTION: This exam was performed according to the departmental dose-optimization program which includes automated exposure control, adjustment of the mA and/or kV according to patient size and/or use of  iterative reconstruction technique. COMPARISON:  Chest x-ray from earlier in the same day, CT of the chest from 07/17/2021. FINDINGS: Cardiovascular: Somewhat limited due to lack of IV contrast. Right chest wall port is noted in satisfactory position. Atherosclerotic calcifications of the thoracic aorta are noted. Generalized decreased attenuation is noted in the cardiac blood pool likely related to anemia. Pulmonary artery as visualized is within normal limits. Mediastinum/Nodes: Thoracic inlet is unremarkable. Multiple mediastinal lymph nodes are identified some of which demonstrates some calcification these have increased in both size and number when compared with the prior exam. Pretracheal node measures 18 mm in short axis. Subcarinal node measures 18 mm in short axis as well. Scattered hilar nodes are seen also increased from the prior exam. Esophagus is within normal limits. Lungs/Pleura: Small effusion is noted bilaterally. Multiple pulmonary nodules are identified throughout both lungs significantly increased in both size and number when compared with the prior exam. Dominant lesion in the left upper lobe measures up to 2.8 cm. Dominant lesion on the right in the lower lobe measures approximately 2.9 cm in greatest dimension. Upper Abdomen: Upper abdomen is limited by the lack of IV contrast although a large geographic area of decreased attenuation is noted in the right lobe of the liver measuring 11 x 6.8 cm this would also be consistent with hepatic metastatic disease increased from prior exams. Musculoskeletal: Degenerative changes of the thoracic spine are seen. No rib fracture is noted. No definitive metastatic disease is seen. IMPRESSION: Significant increase in both size and number of pulmonary metastatic lesions as well as increase in hilar and mediastinal adenopathy when compared with the prior exam of September of 2022. Large area of decreased attenuation is noted in the posterior aspect of the  right lobe of the liver as described consistent with progressive hepatic metastatic disease. Evaluation is somewhat limited due to the lack of IV contrast. Aortic Atherosclerosis (ICD10-I70.0). Electronically Signed   By: Inez Catalina M.D.   On: 05/11/2022 00:00   DG Chest 2 View  Result Date: 05/10/2022 CLINICAL DATA:  Shortness of breath EXAM: CHEST - 2 VIEW COMPARISON:  Chest x-ray 12/28/2021 FINDINGS: Right-sided central venous port stable. Heart size is normal. Mediastinum appears unchanged. Interval increased diffuse ill-defined rounded nodular opacities seen throughout both lungs consistent with known metastases. Largest  lobulated/nodular masslike opacity in the right lateral lung is enlarged since previous study as well. Likely trace right pleural effusion. No pneumothorax. IMPRESSION: Innumerable pulmonary nodular opacities bilaterally as described consistent with known metastases, increased since previous study. Superimposed pneumonia infiltrates can not be excluded. Electronically Signed   By: Ofilia Neas M.D.   On: 05/10/2022 21:00     LOS: 2 days   Oren Binet, MD  [facility]    To contact the attending provider between 7A-7P or the covering provider during after hours 7P-7A, please log into the web site [email or web address] and access using universal [place] password for that web site. If you do not have the password, please call the hospital operator.  05/12/2022, 11:45 AM
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
## 2022-05-12 NOTE — Progress Notes (Signed)
PHARMACY NOTE:  ANTIMICROBIAL RENAL DOSAGE ADJUSTMENT  Current antimicrobial regimen includes a mismatch between antimicrobial dosage and estimated renal function.  As per policy approved by the Pharmacy & Therapeutics and Medical Executive Committees, the antimicrobial dosage will be adjusted accordingly.  Current antimicrobial dosage:  Augmentin 875-125 mg PO every 12 hours   Indication: Acute hypoxic respiratory failure: Multifactorial etiology-due to extensive pulmonary metastatic disease-possibly postobstructive pneumonia.  Renal Function: CKD stage IV,  SCr close to baseline. Estimated Creatinine Clearance: 24 mL/min (A) (by C-G formula based on SCr of 3.37 mg/dL (H)). []      On intermittent HD, scheduled: []      On CRRT    Antimicrobial dosage has been changed to:  Augmentin 500-125 mg po q12 hours    Thank you for allowing pharmacy to be a part of this patient's care.  Nicole Cella, RPh Clinical Pharmacist  05/12/2022 12:29 PM

## 2022-05-12 NOTE — Progress Notes (Signed)
OT Cancellation Note  Patient Details Name: Bradley Colon MRN: [id number] DOB: 20-Jun-1971   Cancelled Treatment:    Reason Eval/Treat Not Completed: Patient at procedure or test/ unavailable.  Malka So 05/12/2022, 12:46 PM Cleta Alberts, OTR/L Acute Rehabilitation Services Office: [phone]

## 2022-05-12 NOTE — Progress Notes (Signed)
SATURATION QUALIFICATIONS: (This note is used to comply with regulatory documentation for home oxygen)  Patient Saturations on Room Air at Rest = 95%  Patient Saturations on Room Air while Ambulating = 86%  Patient Saturations on 3 Liters of oxygen while Ambulating = 97%  Patient SpO2 drops significantly with exertion.

## 2022-05-12 NOTE — Progress Notes (Signed)
  Transition of Care [facility]) Screening Note   Patient Details  Name: Bradley Colon Date of Birth: 07-Feb-1971   [facility] CM/SW Contact:    Cyndi Bender, RN Phone Number: 05/12/2022, 8:56 AM    Transition of Care Department [facility]) has reviewed patient. Patient admit for shortness of breath. History of metastatic rectal adenocarcinoma (with metastases to liver and lungs-NOT candidate for chemotherapy due to poor renal function, self caths at home. We will continue to monitor patient advancement through interdisciplinary progression rounds. If new patient transition needs arise, please place a TOC consult.

## 2022-05-12 NOTE — Telephone Encounter (Signed)
Pt called requesting his [facility]codone [facility] [place] since he was unable to pick up his script at preferred pharmacy d/t itr being on backorder. Pt informed that once he is d/c'ed form hospital we can send in to the outpatient pharmacy for pick up. Pt verbalized understanding, no further needs at this time.

## 2022-05-12 NOTE — TOC Initial Note (Signed)
Transition of Care [facility]) - Initial/Assessment Note    Patient Details  Name: Bradley Colon MRN: [id number] Date of Birth: 09-14-1971  Transition of Care [facility]) CM/SW Contact:    Cyndi Bender, RN Phone Number: 05/12/2022, 2:22 PM  Clinical Narrative:                 Spoke to patient regarding transition needs. Patient wants to go to  [facility] in [place]. Patient has family in [place] he can stay with.Patient requested this RNCM to call his PCP. Spoke to [employer] at Dr. [name] Wilson's office. Butch Penny stated she is going to ask Dr. Redmond Pulling if she will sign the authorization but She spoke to patient's insurnace and found out [facility] cancer [facility] in [place] can do an out of state authorization request as well. This RNCM notified the patient of the above.  Orders for home 02. Patient agreeable to use in house provider [employer]. Spoke to Melvina with [employer] and 02 will be delivered to the room. [employer] delivers a travel oxygen concentrator but replaces it with home concentrator within 4 days. TOC will continue to follow for needs.  Expected Discharge Plan: Home/Self Care Barriers to Discharge: Continued Medical Work up   Patient Goals and CMS Choice Patient states their goals for this hospitalization and ongoing recovery are:: go to cancer hospital in [facility]      Expected Discharge Plan and Services Expected Discharge Plan: Home/Self Care   Discharge Planning Services: CM Consult   Living arrangements for the past 2 months: Single Family Home                 DME Arranged: Oxygen DME Agency: [employer] Date DME Agency Contacted: 05/12/22 Time DME Agency Contacted: 1950 Representative spoke with at DME Agency: Mardene Celeste            Prior Living Arrangements/Services Living arrangements for the past 2 months: Black Lives with:: Minor Children, Spouse Patient language and need for interpreter reviewed:: Yes Do you feel safe going back to the place where  you live?: Yes      Need for Family Participation in Patient Care: Yes (Comment) Care giver support system in place?: Yes (comment)   Criminal Activity/Legal Involvement Pertinent to Current Situation/Hospitalization: No - Comment as needed  Activities of Daily Living      Permission Sought/Granted                  Emotional Assessment   Attitude/Demeanor/Rapport: Engaged Affect (typically observed): Accepting Orientation: : Oriented to Self, Oriented to Place, Oriented to  Time, Oriented to Situation Alcohol / Substance Use: Not Applicable Psych Involvement: No (comment)  Admission diagnosis:  Acute respiratory failure with hypoxia (HCC) [J96.01] Dyspnea, unspecified type [R06.00] Patient Active Problem List   Diagnosis Date Noted   Acute respiratory failure with hypoxia ([place]) 05/10/2022   Obstructive nephropathy due to neurogenic bladder 05/10/2022   Cancer associated pain 05/10/2022   Anemia of chronic disease 05/10/2022   Pressure injury of skin 12/28/2021   ARF (acute renal failure) ([place]) 12/27/2021   Hyponatremia 12/27/2021   Seizure ([place]) 12/27/2021   Essential hypertension 12/27/2021   Bacterial infection due to [place] 12/05/2021   Severe sepsis ([place]) due to UTI / pyelonephritis 12/04/2021   Hyperkalemia 12/04/2021   Acute kidney injury (AKI) with acute tubular necrosis (ATN) ([place]) 93/26/7124   Acute metabolic encephalopathy 58/07/9832   Grand mal seizure ([place]) 12/04/2021   Acute bacterial pyelonephritis 82/50/5397   Metabolic  acidosis 12/04/2021   Hypoglycemia 12/04/2021   Urinary incontinence 11/27/2021   Small bowel obstruction ([place]) 10/15/2021   Palliative care patient 10/11/2021   Lung nodules 07/10/2021   Goals of care, counseling/discussion 10/25/2020   Pelvic and perineal pain 09/12/2020   Post-procedural erectile dysfunction 04/25/2020   Port-A-Cath in place 04/12/2020   Hydrocele 04/11/2020   On antineoplastic chemotherapy 08/19/2019    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[place]) 03/02/2019   Reactive airway disease 03/02/2019   Vegetarian 03/02/2019   Adjustment disorder with mixed anxiety and depressed mood 03/02/2019   Chronic pain after traumatic injury 03/02/2019   Change in bowel habits 01/31/2019   Constipation 01/31/2019   Grade III hemorrhoids 01/31/2019   Rectal pain 01/31/2019   Fibromyalgia 12/18/2017   Dysesthesia affecting both sides of body 12/18/2017   Spondylosis without myelopathy or radiculopathy, lumbar region 12/18/2017   Chronic post-traumatic stress disorder (PTSD) 05/25/2017   Other fatigue 07/27/2016   Chronic left shoulder pain 07/22/2016   Chronic neck pain 11/19/2015   Chronic low back pain 09/18/2014   PCP:  Dorna Mai, MD Pharmacy:   [id number] - [place], [place] [address] [place] [address] Phone: [phone] Fax: [phone]     Social Determinants of Health (SDOH) Interventions    Readmission Risk Interventions    01/01/2022   12:39 PM  Readmission Risk Prevention Plan  Transportation Screening Complete  PCP or Specialist Appt within 3-5 Days Complete  HRI or Home Care Consult Complete  Palliative Care Screening Not Applicable  Medication Review (RN Care Manager) Complete

## 2022-05-12 NOTE — Progress Notes (Signed)
Bilateral LE venous duplex study completed. Please see CV Proc for preliminary results.  Denielle Bayard BS, RVT 05/12/2022 2:57 PM

## 2022-05-12 NOTE — Progress Notes (Signed)
PT Cancellation Note  Patient Details Name: Bradley Colon MRN: [id number] DOB: 29-Jun-1971   Cancelled Treatment:    Reason Eval/Treat Not Completed: Patient at procedure or test/unavailable - pt having bedside test. PT will continue to f/u with pt acutely as available.    Bradley Colon [name] 05/12/2022, 9:05 AM

## 2022-05-13 ENCOUNTER — Other Ambulatory Visit (HOSPITAL_COMMUNITY): Payer: Self-pay

## 2022-05-13 ENCOUNTER — Inpatient Hospital Stay: Payer: Medicaid Other

## 2022-05-13 ENCOUNTER — Encounter: Payer: Self-pay | Admitting: Oncology

## 2022-05-13 ENCOUNTER — Inpatient Hospital Stay (HOSPITAL_COMMUNITY): Payer: Medicaid Other

## 2022-05-13 ENCOUNTER — Telehealth (HOSPITAL_COMMUNITY): Payer: Self-pay | Admitting: Pharmacy Technician

## 2022-05-13 ENCOUNTER — Inpatient Hospital Stay: Payer: Medicaid Other | Admitting: Hematology and Oncology

## 2022-05-13 DIAGNOSIS — I2609 Other pulmonary embolism with acute cor pulmonale: Secondary | ICD-10-CM

## 2022-05-13 DIAGNOSIS — R0600 Dyspnea, unspecified: Secondary | ICD-10-CM

## 2022-05-13 DIAGNOSIS — G893 Neoplasm related pain (acute) (chronic): Secondary | ICD-10-CM | POA: Diagnosis not present

## 2022-05-13 DIAGNOSIS — J9601 Acute respiratory failure with hypoxia: Secondary | ICD-10-CM | POA: Diagnosis not present

## 2022-05-13 DIAGNOSIS — D638 Anemia in other chronic diseases classified elsewhere: Secondary | ICD-10-CM | POA: Diagnosis not present

## 2022-05-13 LAB — URINE CULTURE: Culture: >=100000 — AB

## 2022-05-13 LAB — ECHOCARDIOGRAM COMPLETE
AR max vel: 2.32 cm2
AV Area VTI: 2.1 cm2
AV Area mean vel: 2.28 cm2
AV Mean grad: 4 mmHg
AV Peak grad: 8.4 mmHg
Ao pk vel: 1.45 m/s
Area-P 1/2: 3.65 cm2
Height: 67 in
S' Lateral: 2.6 cm
Weight: 2518.54 oz

## 2022-05-13 LAB — CBC
HCT: 24 % — ABNORMAL LOW (ref 39.0–52.0)
Hemoglobin: 7.6 g/dL — ABNORMAL LOW (ref 13.0–17.0)
MCH: 25.5 pg — ABNORMAL LOW (ref 26.0–34.0)
MCHC: 31.7 g/dL (ref 30.0–36.0)
MCV: 80.5 fL (ref 80.0–100.0)
Platelets: 555 10*3/uL — ABNORMAL HIGH (ref 150–400)
RBC: 2.98 MIL/uL — ABNORMAL LOW (ref 4.22–5.81)
RDW: 15.9 % — ABNORMAL HIGH (ref 11.5–15.5)
WBC: 11.9 10*3/uL — ABNORMAL HIGH (ref 4.0–10.5)
nRBC: 0 % (ref 0.0–0.2)

## 2022-05-13 LAB — RENAL FUNCTION PANEL
Albumin: 2.3 g/dL — ABNORMAL LOW (ref 3.5–5.0)
Anion gap: 12 (ref 5–15)
BUN: 39 mg/dL — ABNORMAL HIGH (ref 6–20)
CO2: 21 mmol/L — ABNORMAL LOW (ref 22–32)
Calcium: 10.2 mg/dL (ref 8.9–10.3)
Chloride: 100 mmol/L (ref 98–111)
Creatinine, Ser: 3.08 mg/dL — ABNORMAL HIGH (ref 0.61–1.24)
GFR, Estimated: 23 mL/min — ABNORMAL LOW (ref >60)
Glucose, Bld: 100 mg/dL — ABNORMAL HIGH (ref 70–99)
Phosphorus: 3.8 mg/dL (ref 2.5–4.6)
Potassium: 4.4 mmol/L (ref 3.5–5.1)
Sodium: 133 mmol/L — ABNORMAL LOW (ref 135–145)

## 2022-05-13 MED ORDER — APIXABAN 5 MG PO TABS
ORAL_TABLET | ORAL | 3 refills | Status: DC
  Filled 2022-05-13: qty 60, 30d supply, fill #0
  Filled 2022-05-13: qty 90, fill #0

## 2022-05-13 MED ORDER — APIXABAN 5 MG PO TABS: 5.0000 mg | ORAL_TABLET | Freq: Two times a day (BID) | ORAL | Status: DC

## 2022-05-13 MED ORDER — APIXABAN 5 MG PO TABS: ORAL_TABLET | ORAL | 3 refills | Status: DC

## 2022-05-13 MED ORDER — APIXABAN (ELIQUIS) VTE STARTER PACK (10MG AND 5MG)
ORAL_TABLET | ORAL | 0 refills | Status: DC
  Filled 2022-05-13: qty 74, 30d supply, fill #0

## 2022-05-13 MED ORDER — AMOXICILLIN-POT CLAVULANATE 500-125 MG PO TABS
1.0000 | ORAL_TABLET | Freq: Two times a day (BID) | ORAL | 0 refills | Status: AC
  Filled 2022-05-13: qty 10, 5d supply, fill #0

## 2022-05-13 MED ORDER — APIXABAN 5 MG PO TABS: 10.0000 mg | ORAL_TABLET | Freq: Two times a day (BID) | ORAL | Status: DC

## 2022-05-13 MED ORDER — CLONAZEPAM 0.5 MG PO TABS: 0.5000 mg | ORAL_TABLET | Freq: Three times a day (TID) | ORAL | Status: DC | PRN

## 2022-05-13 MED ORDER — FOSFOMYCIN TROMETHAMINE 3 G PO PACK
3.0000 g | PACK | Freq: Once | ORAL | Status: AC
Start: 2022-05-13 — End: 2022-05-13
  Administered 2022-05-13: 3 g via ORAL
  Filled 2022-05-13: qty 3

## 2022-05-13 NOTE — Discharge Instructions (Signed)
Information on my medicine - ELIQUIS (apixaban)  This medication education was reviewed with me or my healthcare representative as part of my discharge preparation.   Why was Eliquis prescribed for you? Eliquis was prescribed to treat blood clots that may have been found in the veins of your legs (deep vein thrombosis) or in your lungs (pulmonary embolism) and to reduce the risk of them occurring again.  What do You need to know about Eliquis ? The starting dose is 10 mg (two 5 mg tablets) taken TWICE daily for the FIRST SEVEN (7) DAYS, then on  05-21-2022  the dose is reduced to ONE 5 mg tablet taken TWICE daily.  Eliquis may be taken with or without food.   Try to take the dose about the same time in the morning and in the evening. If you have difficulty swallowing the tablet whole please discuss with your pharmacist how to take the medication safely.  Take Eliquis exactly as prescribed and DO NOT stop taking Eliquis without talking to the doctor who prescribed the medication.  Stopping may increase your risk of developing a new blood clot.  Refill your prescription before you run out.  After discharge, you should have regular check-up appointments with your healthcare provider that is prescribing your Eliquis.    What do you do if you miss a dose? If a dose of ELIQUIS is not taken at the scheduled time, take it as soon as possible on the same day and twice-daily administration should be resumed. The dose should not be doubled to make up for a missed dose.  Important Safety Information A possible side effect of Eliquis is bleeding. You should call your healthcare provider right away if you experience any of the following: Bleeding from an injury or your nose that does not stop. Unusual colored urine (red or dark brown) or unusual colored stools (red or black). Unusual bruising for unknown reasons. A serious fall or if you hit your head (even if there is no bleeding).  Some  medicines may interact with Eliquis and might increase your risk of bleeding or clotting while on Eliquis. To help avoid this, consult your healthcare provider or pharmacist prior to using any new prescription or non-prescription medications, including herbals, vitamins, non-steroidal anti-inflammatory drugs (NSAIDs) and supplements.  This website has more information on Eliquis (apixaban): http://www.eliquis.com/eliquis/home

## 2022-05-13 NOTE — Progress Notes (Addendum)
PROGRESS NOTE        PATIENT DETAILS Name: Bradley Colon Age: 51 y.o. Sex: male Date of Birth: Apr 07, 1971 Admit Date: 05/10/2022 Admitting Physician Lenore Cordia, MD [id number], [name], MD  Brief Summary: Patient is a 51 y.o.  male metastatic rectal adenocarcinoma-no longer candidate for chemotherapy due to CKD-presented with shortness of breath-and admitted to the hospitalist service for further evaluation and treatment.  Significant events: 7/8>> presented with shortness of breath-significant parenchymal metastatic disease on CT x-ray-admit to [facility].  Significant studies: 7/8>> CT chest: Significant increase in size/number of pulmonary metastatic lesions, increasing hilar/mediastinal lymphadenopathy.  Progressive hepatic metastatic disease. 7/10>> VQ scan: High probability 7/10>> bilateral lower extremity Dopplers: Negative for DVT 7/11>> Echo: Pending  Significant microbiology data: 7/8>> blood culture: Negative 7/8>> urine culture: E. coli/Klebsiella (likely contaminant)  Procedures:   Consults: Palliative care.  Subjective: Seen twice today-this morning he was relatively pleasant-very anxious that he is short of breath with activity (I explained that this was to be expected with the amount of underlying lung mets).  Later-I was informed that he and his mother wanted to stay another day in the hospital-I subsequently went to his room to reevaluate him-he is very anxious but argumentative-about discharge plans.  Saying he is the patient-and that he has been healthy-I had to remind him that he has stage IV/advanced cancer.  This MD spent significant amount of time with patient/family yesterday-reviewing images of x-ray/CT scans and showing them how significant his disease burden was-especially in his lungs.  Both family/patient aware that he is likely to worsen- will likely have significant shortness of breath for the foreseeable  future.  Objective: Vitals: Blood pressure 102/63, pulse 81, temperature 97.7 F (36.5 C), temperature source Oral, resp. rate 16, height 5\' 7"  (1.702 m), weight 71.4 kg, SpO2 99 %.   Exam: Gen Exam:Alert awake-not in any distress.  Comfortable but anxious-not in any distress. HEENT:atraumatic, normocephalic Chest: B/L clear to auscultation anteriorly CVS:S1S2 regular Abdomen:soft non tender, non distended Extremities:no edema Neurology: Non focal Skin: no rash   Pertinent Labs/Radiology:    Latest Ref Rng & Units 05/13/2022    3:00 AM 05/12/2022    2:01 AM 05/11/2022    4:43 AM  CBC  WBC 4.0 - 10.5 K/uL 11.9  11.7  16.0   Hemoglobin 13.0 - 17.0 g/dL 7.6  7.3  8.0   Hematocrit 39.0 - 52.0 % 24.0  23.8  25.8   Platelets 150 - 400 K/uL 555  520  498     Lab Results  Component Value Date   NA 133 (L) 05/13/2022   K 4.4 05/13/2022   CL 100 05/13/2022   CO2 21 (L) 05/13/2022      Assessment/Plan: Acute hypoxic respiratory failure: Multifactorial etiology-due to extensive pulmonary metastatic disease-PE and possible postobstructive PNA.  Qualifies for home O2-continue Augmentin-switch from Lovenox to Eliquis (discussed with oncologist Dr. Lorenso Courier).  Given extent of parenchymal lung disease-both patient/mother aware that he will likely have shortness of breath going forward.  CKD stage IV-neurogenic bladder-self-catheterization at home: No evidence of AKI (ruled out)--creatinine close to baseline.  Continue self-catheterization here in the hospital.  Pulmonary embolism: VQ scan with high probability-lower extremity Doppler negative for DVT-awaiting echo.  Given history of malignancy-was on Lovenox-discussed with oncologist-Dr. Dorsey-switching to Eliquis.  Normocytic anemia: Due to a combination of CKD and underlying  extensive malignancy.  Transfuse if hemoglobin less than 7.  Chronic pain syndrome: Continue usual narcotic regimen.  Metastatic rectal adenocarcinoma: Reviewed  prior oncology note-not a candidate for chemotherapy.  Patient/family plan to go to [place] and get another opinion from [facility].  Unfortunately given anxiety-prior history of PTSD-patient seems to be in some sort of denial about his advanced disease process (spoke at length with patient's mother over the phone today-she is a RN-a former oncology nurse-and very well aware of his dire prognostic situation)  Anxiety/history of PTSD: Significant component of anxiety-frustration with his health.  However significant amount of denial as well-not sure if he understands his extensive cancer burden-and advanced disease process.  He is very argumentative, brings up significant prior issues with other physicians-and is very difficult to have a conversation.  This morning-I did tell him that he is on short acting narcotic medication that he could use if he felt excessively short of breath or anxious.  I will go ahead and add a low-dose Klonopin as well.  Severe protein calorie malnutrition: In the context of advanced malignancy-we will get nutrition evaluation.  Palliative care: Full code for now-advanced malignancy-with very poor prognosis.  Followed at the cancer center by oncology and also by the palliative care team.  Got a message from Dr. Hilma Favors on 7/10-palliative care who knows this patient well and has engaged with this patient in the past-he has had similar issues with anxiety/denial/unable to make decisions-and advises that we continue supportive care and consult palliative care if his situation worsens.  Family plans to take him to [place] to get another opinion at [facility].  As noted above-I had a very frank conversation with the patient's mother-who is a former oncology nurse-she very well understands his dire prognosis-and behavioral issues that have interfered with his medical care.  I strongly recommended that after that he gets a another opinion at  [facility] consider initiation of hospice care.    BMI: Estimated body mass index is 24.65 kg/m as calculated from the following:   Height as of this encounter: 5\' 7"  (1.702 m).   Weight as of this encounter: 71.4 kg.   Code status:   Code Status: Full Code   DVT Prophylaxis:    Family Communication:    Disposition Plan: Status is: Inpatient Remains inpatient appropriate because: Awaiting echo-following which he could probably be discharged home.   Planned Discharge Destination:Home later today or tomorrow.   Diet: Diet Order             Diet regular Room service appropriate? Yes; Fluid consistency: Thin  Diet effective now                     Antimicrobial agents: Anti-infectives (From admission, onward)    Start     Dose/Rate Route Frequency Ordered Stop   05/12/22 1245  amoxicillin-clavulanate (AUGMENTIN) 500-125 MG per tablet 500 mg        1 tablet Oral Every 12 hours 05/12/22 1156     05/11/22 1330  azithromycin (ZITHROMAX) tablet 500 mg  Status:  Discontinued        500 mg Oral Daily 05/11/22 1325 05/12/22 1156   05/11/22 0800  cefTRIAXone (ROCEPHIN) 2 g in sodium chloride 0.9 % 100 mL IVPB  Status:  Discontinued        2 g 200 mL/hr over 30 Minutes Intravenous Every 24 hours 05/11/22 0017 05/12/22 1156   05/11/22 0400  azithromycin (ZITHROMAX) 500  mg in sodium chloride 0.9 % 250 mL IVPB  Status:  Discontinued        500 mg 250 mL/hr over 60 Minutes Intravenous Every 24 hours 05/11/22 0017 05/11/22 1325   05/10/22 2300  vancomycin (VANCOREADY) IVPB 1250 mg/250 mL        1,250 mg 83.3 mL/hr over 180 Minutes Intravenous  Once 05/10/22 2258 05/11/22 0406   05/10/22 2300  ceFEPIme (MAXIPIME) 2 g in sodium chloride 0.9 % 100 mL IVPB        2 g 200 mL/hr over 30 Minutes Intravenous  Once 05/10/22 2258 05/10/22 2354        MEDICATIONS: Scheduled Meds:  amoxicillin-clavulanate  1 tablet Oral Q12H   enoxaparin (LOVENOX) injection  1 mg/kg  Subcutaneous Q24H   gabapentin  300 mg Oral BID   guaiFENesin  600 mg Oral BID   morphine  60 mg Oral Q12H   sodium chloride flush  3 mL Intravenous Q12H   tamsulosin  0.8 mg Oral Daily   Continuous Infusions:   PRN Meds:.acetaminophen **OR** acetaminophen, albuterol, bisacodyl, clonazePAM, ondansetron **OR** ondansetron (ZOFRAN) IV, oxyCODONE, senna-docusate   I have personally reviewed following labs and imaging studies  LABORATORY DATA: CBC: Recent Labs  Lab 05/10/22 2006 05/11/22 0443 05/12/22 0201 05/13/22 0300  WBC 22.8* 16.0* 11.7* 11.9*  NEUTROABS 18.8*  --  8.2*  --   HGB 9.0* 8.0* 7.3* 7.6*  HCT 29.5* 25.8* 23.8* 24.0*  MCV 82.2 82.4 83.2 80.5  PLT 644* 498* 520* 555*    Basic Metabolic Panel: Recent Labs  Lab 05/10/22 2006 05/11/22 0443 05/12/22 0201 05/13/22 0300  NA 133* 133* 135 133*  K 5.0 5.2* 4.5 4.4  CL 97* 100 101 100  CO2 20* 21* 21* 21*  GLUCOSE 134* 89 99 100*  BUN 51* 50* 42* 39*  CREATININE 3.87* 3.66* 3.37* 3.08*  CALCIUM 10.6* 9.9 10.0 10.2  PHOS  --   --   --  3.8    GFR: Estimated Creatinine Clearance: 26.2 mL/min (A) (by C-G formula based on SCr of 3.08 mg/dL (H)).  Liver Function Tests: Recent Labs  Lab 05/13/22 0300  ALBUMIN 2.3*   No results for input(s): "LIPASE", "AMYLASE" in the last 168 hours. No results for input(s): "AMMONIA" in the last 168 hours.  Coagulation Profile: No results for input(s): "INR", "PROTIME" in the last 168 hours.  Cardiac Enzymes: No results for input(s): "CKTOTAL", "CKMB", "CKMBINDEX", "TROPONINI" in the last 168 hours.  BNP (last 3 results) No results for input(s): "PROBNP" in the last 8760 hours.  Lipid Profile: No results for input(s): "CHOL", "HDL", "LDLCALC", "TRIG", "CHOLHDL", "LDLDIRECT" in the last 72 hours.  Thyroid Function Tests: No results for input(s): "TSH", "T4TOTAL", "FREET4", "T3FREE", "THYROIDAB" in the last 72 hours.  Anemia Panel: No results for input(s):  "VITAMINB12", "FOLATE", "FERRITIN", "TIBC", "IRON", "RETICCTPCT" in the last 72 hours.  Urine analysis:    Component Value Date/Time   COLORURINE YELLOW 05/10/2022 2223   APPEARANCEUR TURBID (A) 05/10/2022 2223   LABSPEC 1.011 05/10/2022 2223   PHURINE 5.0 05/10/2022 2223   GLUCOSEU NEGATIVE 05/10/2022 2223   HGBUR SMALL (A) 05/10/2022 2223   BILIRUBINUR NEGATIVE 05/10/2022 2223   KETONESUR NEGATIVE 05/10/2022 2223   PROTEINUR 100 (A) 05/10/2022 2223   UROBILINOGEN 0.2 01/09/2014 0008   NITRITE NEGATIVE 05/10/2022 2223   LEUKOCYTESUR LARGE (A) 05/10/2022 2223    Sepsis Labs: Lactic Acid, Venous    Component Value Date/Time   LATICACIDVEN 1.9 12/27/2021  2235    MICROBIOLOGY: Recent Results (from the past 240 hour(s))  Urine Culture     Status: Abnormal   Collection Time: 05/10/22  9:57 PM   Specimen: Urine, Catheterized  Result Value Ref Range Status   Specimen Description URINE, CATHETERIZED  Final   Special Requests   Final    NONE Performed at [facility], [address], [place], [place] [address]    Culture (A)  Final    >=100,000 COLONIES/mL KLEBSIELLA PNEUMONIAE >=100,000 COLONIES/mL ESCHERICHIA COLI    Report Status 05/13/2022 FINAL  Final   Organism ID, Bacteria KLEBSIELLA PNEUMONIAE (A)  Final   Organism ID, Bacteria ESCHERICHIA COLI (A)  Final      Susceptibility   Escherichia coli - MIC*    AMPICILLIN >=32 RESISTANT Resistant     CEFAZOLIN >=64 RESISTANT Resistant     CEFEPIME <=0.12 SENSITIVE Sensitive     CEFTRIAXONE 32 RESISTANT Resistant     CIPROFLOXACIN <=0.25 SENSITIVE Sensitive     GENTAMICIN <=1 SENSITIVE Sensitive     IMIPENEM <=0.25 SENSITIVE Sensitive     NITROFURANTOIN <=16 SENSITIVE Sensitive     TRIMETH/SULFA <=20 SENSITIVE Sensitive     AMPICILLIN/SULBACTAM 16 INTERMEDIATE Intermediate     PIP/TAZO <=4 SENSITIVE Sensitive     * >=100,000 COLONIES/mL ESCHERICHIA COLI   Klebsiella pneumoniae - MIC*    AMPICILLIN >=32 RESISTANT  Resistant     CEFAZOLIN <=4 SENSITIVE Sensitive     CEFEPIME <=0.12 SENSITIVE Sensitive     CEFTRIAXONE <=0.25 SENSITIVE Sensitive     CIPROFLOXACIN <=0.25 SENSITIVE Sensitive     GENTAMICIN <=1 SENSITIVE Sensitive     IMIPENEM <=0.25 SENSITIVE Sensitive     NITROFURANTOIN 32 SENSITIVE Sensitive     TRIMETH/SULFA <=20 SENSITIVE Sensitive     AMPICILLIN/SULBACTAM 4 SENSITIVE Sensitive     PIP/TAZO <=4 SENSITIVE Sensitive     * >=100,000 COLONIES/mL KLEBSIELLA PNEUMONIAE  Blood culture (routine x 2)     Status: None (Preliminary result)   Collection Time: 05/10/22 10:14 PM   Specimen: BLOOD  Result Value Ref Range Status   Specimen Description BLOOD LEFT ANTECUBITAL  Final   Special Requests   Final    BOTTLES DRAWN AEROBIC AND ANAEROBIC Blood Culture adequate volume   Culture   Final    NO GROWTH 2 DAYS Performed at [facility], [address], [place], [place] [address]    Report Status PENDING  Incomplete  Blood culture (routine x 2)     Status: None (Preliminary result)   Collection Time: 05/10/22 10:19 PM   Specimen: BLOOD  Result Value Ref Range Status   Specimen Description BLOOD RIGHT ANTECUBITAL  Final   Special Requests   Final    BOTTLES DRAWN AEROBIC AND ANAEROBIC Blood Culture adequate volume   Culture   Final    NO GROWTH 2 DAYS Performed at [facility], [place] [address] [place], [place] [address]    Report Status PENDING  Incomplete    RADIOLOGY STUDIES/RESULTS: VAS [place] LOWER EXTREMITY VENOUS (DVT)  Result Date: 05/12/2022  Lower Venous DVT Study Patient Name:  ALMIN LIVINGSTONE  Date of Exam:   05/12/2022 Medical Rec #: [id number]   Accession #:    [id number] Date of Birth: 08/21/1971  Patient Gender: M Patient Age:   53 years Exam Location:  [facility] Procedure:      VAS [place] LOWER EXTREMITY VENOUS (DVT) Referring Phys: Oren Binet --------------------------------------------------------------------------------  Indications: Pulmonary  embolism.   Comparison Study: No previous exam noted. Performing Technologist: Bobetta Lime BS, RVT  Examination Guidelines: A complete evaluation includes B-mode imaging, spectral Doppler, color Doppler, and power Doppler as needed of all accessible portions of each vessel. Bilateral testing is considered an integral part of a complete examination. Limited examinations for reoccurring indications may be performed as noted. The reflux portion of the exam is performed with the patient in reverse Trendelenburg.  +---------+---------------+---------+-----------+----------+--------------+ RIGHT    CompressibilityPhasicitySpontaneityPropertiesThrombus Aging +---------+---------------+---------+-----------+----------+--------------+ CFV      Full           Yes      Yes                                 +---------+---------------+---------+-----------+----------+--------------+ SFJ      Full                                                        +---------+---------------+---------+-----------+----------+--------------+ FV Prox  Full                                                        +---------+---------------+---------+-----------+----------+--------------+ FV Mid   Full                                                        +---------+---------------+---------+-----------+----------+--------------+ FV DistalFull                                                        +---------+---------------+---------+-----------+----------+--------------+ PFV      Full                                                        +---------+---------------+---------+-----------+----------+--------------+ POP      Full           Yes      Yes                                 +---------+---------------+---------+-----------+----------+--------------+ PTV      Full                                                        +---------+---------------+---------+-----------+----------+--------------+  PERO     Full                                                        +---------+---------------+---------+-----------+----------+--------------+   +---------+---------------+---------+-----------+----------+--------------+  LEFT     CompressibilityPhasicitySpontaneityPropertiesThrombus Aging +---------+---------------+---------+-----------+----------+--------------+ CFV      Full           Yes      Yes                                 +---------+---------------+---------+-----------+----------+--------------+ SFJ      Full                                                        +---------+---------------+---------+-----------+----------+--------------+ FV Prox  Full                                                        +---------+---------------+---------+-----------+----------+--------------+ FV Mid   Full                                                        +---------+---------------+---------+-----------+----------+--------------+ FV DistalFull                                                        +---------+---------------+---------+-----------+----------+--------------+ PFV      Full                                                        +---------+---------------+---------+-----------+----------+--------------+ POP      Full           Yes      Yes                                 +---------+---------------+---------+-----------+----------+--------------+ PTV      Full                                                        +---------+---------------+---------+-----------+----------+--------------+ PERO     Full                                                        +---------+---------------+---------+-----------+----------+--------------+    Summary: BILATERAL: - No evidence of deep vein thrombosis seen in the lower extremities, bilaterally. -No evidence of popliteal cyst, bilaterally.   *See table(s) above for measurements and  observations.    Preliminary    NM Pulmonary Perfusion  Result Date: 05/12/2022  CLINICAL DATA:  Pulmonary embolism (PE) suspected, positive D-dimer EXAM: NUCLEAR MEDICINE PERFUSION LUNG SCAN TECHNIQUE: Perfusion images were obtained in multiple projections after intravenous injection of radiopharmaceutical. Ventilation scans intentionally deferred if perfusion scan and chest x-ray adequate for interpretation during COVID 19 epidemic. RADIOPHARMACEUTICALS:  4.3 mCi Tc-66m MAA IV COMPARISON:  Chest x-ray 05/12/2022 FINDINGS: Heterogeneous distribution of radiotracer within both lung fields with multiple superimposed mismatched wedge-shaped segmental perfusion defects bilaterally. IMPRESSION: High probability for pulmonary embolism. These results will be called to the ordering clinician or representative by the Radiologist Assistant, and communication documented in the PACS or [employer]. Electronically Signed   By: Davina Poke D.O.   On: 05/12/2022 11:04   DG CHEST PORT 1 VIEW  Result Date: 05/12/2022 CLINICAL DATA:  Shortness of breath, hypoxia, cancer EXAM: PORTABLE CHEST 1 VIEW COMPARISON:  Radiograph 05/10/2022 FINDINGS: Chest port catheter tip overlies the right atrium. Unchanged cardiomediastinal silhouette. There are innumerable pulmonary metastases as seen on prior exams. There is no definite new airspace disease. There are trace pleural effusions. No pneumothorax. No acute osseous abnormality. IMPRESSION: Extensive pulmonary metastases.  No definite new airspace disease. Electronically Signed   By: Maurine Simmering M.D.   On: 05/12/2022 08:33     LOS: 3 days   Oren Binet, MD  [facility]    To contact the attending provider between 7A-7P or the covering provider during after hours 7P-7A, please log into the web site [email or web address] and access using universal [place] password for that web site. If you do not have the password, please call the hospital  operator.  05/13/2022, 2:35 PM

## 2022-05-13 NOTE — TOC Benefit Eligibility Note (Signed)
Patient Teacher, English as a foreign language completed.    The patient is currently admitted and upon discharge could be taking Eliquis 5 mg.  The current 30 day co-pay is, $4.00.   The patient is currently admitted and upon discharge could be taking Xarelto 20 mg.  The current 30 day co-pay is, $4.00.   The patient is currently admitted and upon discharge could be taking warfarin (Coumadin) 5 mg.  The current 30 day co-pay is, $4.00.   The patient is insured through [place] [place] Patient Advocate Specialist [place] Patient Advocate Team Direct Number: [phone]  Fax: [phone]

## 2022-05-13 NOTE — Discharge Summary (Addendum)
PATIENT DETAILS Name: Bradley Colon Age: 51 y.o. Sex: male Date of Birth: 02/18/1971 MRN: [id number]. Admitting Physician: Lenore Cordia, MD JIR:CVELFY, [name], MD  Admit Date: 05/10/2022 Discharge date: 05/13/2022  Recommendations for Outpatient Follow-up:  Follow up with PCP in 1-2 weeks Please obtain CMP/CBC in one week Patient intends to follow at [facility] contemplating driving him to [place] area. Strongly recommend initiation of hospice/palliative care at this point.  Admitted From:  Home  Disposition: Home   Discharge Condition: poor  CODE STATUS:   Code Status: Full Code   Diet recommendation:  Diet Order             Diet general           Diet regular Room service appropriate? Yes; Fluid consistency: Thin  Diet effective now                    Brief Summary: Patient is a 51 y.o.  male metastatic rectal adenocarcinoma-no longer candidate for chemotherapy due to CKD-presented with shortness of breath-and admitted to the hospitalist service for further evaluation and treatment.   Significant events: 7/8>> presented with shortness of breath-significant parenchymal metastatic disease on CT x-ray-admit to [facility]   Significant studies: 7/8>> CT chest: Significant increase in size/number of pulmonary metastatic lesions, increasing hilar/mediastinal lymphadenopathy.  Progressive hepatic metastatic disease. 7/10>> VQ scan: High probability 7/10>> bilateral lower extremity Dopplers: Negative for DVT 7/11>> Echo:EF 60-65%,RV syst function is normal   Significant microbiology data: 7/8>> blood culture: Negative 7/8>> urine culture: E. coli/Klebsiella (likely contaminant)   Procedures:     Consults: Palliative care.   Brief Hospital Course: Acute hypoxic respiratory failure: Multifactorial etiology-due to extensive pulmonary metastatic disease-PE and possible postobstructive PNA.  Qualifies for home O2-continue Augmentin-switch  from Lovenox to Eliquis (discussed with oncologist Dr. Lorenso Courier).  Given extent of parenchymal lung disease-both patient/mother aware that he will likely have shortness of breath going forward.  Home O2 arranged-and he will be discharged home with oxygen.   CKD stage IV-neurogenic bladder-self-catheterization at home: No evidence of AKI (ruled out)--creatinine close to baseline.  Continue to self catheterize as previous.  Polymicrobial UTI: he self caths-suspect this is a contamination-as he has no signs of UTI.but was already on antibiotics in any event.    Pulmonary embolism: VQ scan with high probability-lower extremity Doppler negative for DVT.Given history of malignancy-was on Lovenox-discussed with oncologist-Dr. Dorsey-switching to Eliquis (starter pack sent to [facility], and refills sent to his regular pharmacy by [facility]).   Normocytic anemia: Due to a combination of CKD and underlying extensive malignancy.  Continue to monitor CBC closely while on Eliquis.   Chronic pain syndrome: Continue usual narcotic regimen.   Metastatic rectal adenocarcinoma: Reviewed prior oncology note-not a candidate for chemotherapy-no longer follows with Dr. Louanne Skye seen Dr. Lorenso Courier recently.  Patient/family plan to go to [place] area and get another opinion from [facility].  See issues with anxiety/behavioral issues below.   Anxiety/history of PTSD: Significant component of anxiety-frustration with his health-denial and significant other issues unfortunately have played a big role in his clinical course of the past few months/years-he no longer follows with Dr. Leveda Anna has seen Dr. Lorenso Courier recently-and continues to have multiple grievances with his physicians/nurses that he has seen.  Long conversation with the patient's mother at the patient's request this afternoon-family plans to get him to [facility]-but understands poor prognosis-and recommendations to start hospice care at some  point.   Severe protein calorie  malnutrition: In the context of advanced malignancy   Palliative care: Remains a full code-has had extensive outpatient follow-up with palliative care at [place] who I discussed to on 7/10 nausea very well-due to issues with denial/anxiety/psych issues-he has been unable to get past multiple grievances/complaints.  Per Dr. Kela Millin that palliative care can add at this point-they will continue to follow with patient in the outpatient setting.  As noted above-long discussion by this MD with patient's mother today-she is very well aware of his behavioral/psych issues and the extensive his underlying cancer burden.  I explicitly recommended initiation of hospice care once he gets another opinion at [facility].  She is a former oncology nurse-and seems to understand his extensive disease burden/prognosis.    BMI: Estimated body mass index is 24.65 kg/m as calculated from the following:   Height as of this encounter: 5\' 7"  (1.702 m).   Weight as of this encounter: 71.4 kg.   RN pressure injury documentation: Pressure Injury 12/28/21 Buttocks Right Stage 2 -  Partial thickness loss of dermis presenting as a shallow open injury with a red, pink wound bed without slough. (Active)  12/28/21 1532  Location: Buttocks  Location Orientation: Right  Staging: Stage 2 -  Partial thickness loss of dermis presenting as a shallow open injury with a red, pink wound bed without slough.  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  Get Medicines reviewed and adjusted: Please take all your medications with you for your next visit with your Primary MD  Laboratory/radiological data: Please request your Primary MD to go over all hospital tests and procedure/radiological results at the follow up, please ask your Primary MD to get all Hospital records sent to his/her office.  In some cases, they will be blood work, cultures and biopsy results pending at the time of your discharge. Please request that your primary care M.D. follows up on these results.  Also Note the following: If you experience worsening of your admission symptoms, develop shortness of breath, life threatening emergency, suicidal or homicidal thoughts you must seek medical attention immediately by calling 911 or calling your MD immediately  if symptoms less severe.  You must read complete instructions/literature along with all the possible adverse reactions/side effects for all the Medicines you take and that have been prescribed to you. Take any new Medicines after you have completely understood and accpet all the possible adverse reactions/side effects.   Do not drive when taking Pain medications or sleeping medications (Benzodaizepines)  Do not take more than prescribed Pain, Sleep and Anxiety Medications. It is not advisable to combine anxiety,sleep and pain medications without talking with your primary  care practitioner  Special Instructions: If you have smoked or chewed Tobacco  in the last 2 yrs please stop smoking, stop any regular Alcohol  and or any Recreational drug use.  Wear Seat belts while  driving.  Please note: You were cared for by a hospitalist during your hospital stay. Once you are discharged, your primary care physician will handle any further medical issues. Please note that NO REFILLS for any discharge medications will be authorized once you are discharged, as it is imperative that you return to your primary care physician (or establish a relationship with a primary care physician if you do not have one) for your post hospital discharge needs so that they can reassess your need for medications and monitor your lab values.   1.  Use home O2 24/7.  2.  Please call your primary oncologist/primary care practitioner for refills regarding your narcotic medications.  There is a note in the chart that they will send prescriptions once you let them know that you are out of the hospital.  3.  Continue Eliquis-starting on 7/12 take 2 tablets twice daily for 7 days, starting on 7/19-switch to 1 tablet twice daily and stay on it indefinitely.   Increase activity slowly   Complete by: As directed       Allergies as of 05/13/2022       Reactions   Bee Venom Anaphylaxis, Swelling   Pt has been stung by honey bees since he became a vegetarian at age 54 yrs old with no reaction.   Other Shortness Of Breath, Itching   Cut grass - SOB, itching Pet dander - SOB, itching   Contrast Media [iodinated Contrast Media] Hives   Shrimp [shellfish Allergy] Swelling, Other (See Comments)   Muscle cramps   Vancocin [vancomycin] Itching   Reaction to IV - pt states no reaction if it is infused slowly   Wheat Bran Swelling   Latex Hives, Rash   Reaction to fumes from latex paint        Medication List     STOP taking these medications    furosemide 40 MG tablet Commonly known as: LASIX       TAKE these medications    acetaminophen 500 MG tablet Commonly known as: TYLENOL Take 1,000 mg by mouth 2 (two) times daily.   amoxicillin-clavulanate 500-125 MG tablet Commonly known as:  AUGMENTIN Take 1 tablet (500 mg total) by mouth every 12 (twelve) hours for 5 days.   Arnicare Arnica Crea Apply 1 Application topically 2 (two) times daily as needed (leg pain).   Eliquis DVT/PE Starter Pack Generic drug: Apixaban Starter Pack (10mg  and 5mg ) Take as directed per package   apixaban 5 MG Tabs tablet Commonly known as: ELIQUIS Take one tablet by mouth twice daily. Start taking on: May 14, 2022   gabapentin 300 MG capsule Commonly known as: NEURONTIN Take 1 capsule (300 mg total) by mouth 2 (two) times daily. Pt is taking one cap in AM and one in PM per nephrologist What changed: additional instructions   IRON PO Take 1 tablet by mouth daily.   loratadine 10 MG tablet Commonly known as: CLARITIN Take 10 mg by mouth daily as needed for allergies.   morphine 60 MG 12 hr tablet Commonly known as: MS CONTIN Take 1 tablet by mouth every 12 hours.   Oxycodone HCl 20 MG Tabs Take 1 tablet by mouth 3 times daily as needed for severe pain.   sodium bicarbonate 650 MG  tablet Take 650 mg by mouth 3 (three) times daily.   tamsulosin 0.4 MG Caps capsule Commonly known as: FLOMAX TAKE 2 CAPSULES EVERY DAY   Ventolin HFA 108 (90 Base) MCG/ACT inhaler Generic drug: albuterol Inhale 2 puffs into the lungs every 6 (six) hours as needed for wheezing or shortness of breath.               Durable Medical Equipment  (From admission, onward)           Start     Ordered   05/12/22 1344  For home use only DME oxygen  Once       Question Answer Comment  Length of Need Lifetime   Mode or (Route) Nasal cannula   Liters per Minute 3   Frequency Continuous (stationary and portable oxygen unit needed)   Oxygen conserving device Yes   Oxygen delivery system Gas      05/12/22 1343            Follow-up Information     [facility] Follow up.   Why: home 02 Contact information: [address] [place] [address] [phone]          Dorna Mai, MD. Schedule an appointment as soon as possible for a visit in 1 week(s).   Specialty: Family Medicine Contact information: [address] [place] [address] [phone]                Allergies  Allergen Reactions   Bee Venom Anaphylaxis and Swelling    Pt has been stung by honey bees since he became a vegetarian at age 42 yrs old with no reaction.   Other Shortness Of Breath and Itching    Cut grass - SOB, itching Pet dander - SOB, itching   Contrast Media [Iodinated Contrast Media] Hives   Shrimp [Shellfish Allergy] Swelling and Other (See Comments)    Muscle cramps   Vancocin [Vancomycin] Itching    Reaction to IV - pt states no reaction if it is infused slowly   Wheat Bran Swelling   Latex Hives and Rash    Reaction to fumes from latex paint     Other Procedures/Studies: ECHOCARDIOGRAM COMPLETE  Result Date: 05/13/2022    ECHOCARDIOGRAM REPORT   Patient Name:   Bradley Colon Date of Exam: 05/13/2022 Medical Rec #:  [id number]  Height:       67.0 in Accession #:    [id number] Weight:       157.4 lb Date of Birth:  Sep 24, 1971 BSA:          1.827 m Patient Age:    59 years   BP:           102/65 mmHg Patient Gender: M          HR:           85 bpm. Exam Location:  Inpatient Procedure: 2D Echo, Cardiac Doppler and Color Doppler Indications:    Pulmonary Embolus I26.09  History:        Patient has no prior history of Echocardiogram examinations. VQ                 Scan 05/12/22 which showed high probability of pulmonary embolus;                 Signs/Symptoms:Shortness of Breath. Metastatic rectal cancer                 with history of  chemotherapy stopped due to renal failure.  Sonographer:    Merrie Roof RDCS Referring Phys: [place]  1. Left ventricular ejection fraction, by estimation, is 60 to 65%. The left ventricle has normal function. The left ventricle has no regional wall motion abnormalities. Left ventricular  diastolic parameters are consistent with Grade I diastolic dysfunction (impaired relaxation).  2. Right ventricular systolic function is normal. The right ventricular size is normal.  3. The mitral valve is normal in structure. No evidence of mitral valve regurgitation. No evidence of mitral stenosis.  4. The aortic valve is normal in structure. There is mild calcification of the aortic valve. There is mild thickening of the aortic valve. Aortic valve regurgitation is not visualized. Aortic valve sclerosis is present, with no evidence of aortic valve stenosis.  5. The inferior vena cava is normal in size with greater than 50% respiratory variability, suggesting right atrial pressure of 3 mmHg. FINDINGS  Left Ventricle: Left ventricular ejection fraction, by estimation, is 60 to 65%. The left ventricle has normal function. The left ventricle has no regional wall motion abnormalities. The left ventricular internal cavity size was normal in size. There is  no left ventricular hypertrophy. Left ventricular diastolic parameters are consistent with Grade I diastolic dysfunction (impaired relaxation). Normal left ventricular filling pressure. Right Ventricle: The right ventricular size is normal. No increase in right ventricular wall thickness. Right ventricular systolic function is normal. Left Atrium: Left atrial size was normal in size. Right Atrium: Right atrial size was normal in size. Pericardium: There is no evidence of pericardial effusion. Mitral Valve: The mitral valve is normal in structure. No evidence of mitral valve regurgitation. No evidence of mitral valve stenosis. Tricuspid Valve: The tricuspid valve is normal in structure. Tricuspid valve regurgitation is not demonstrated. No evidence of tricuspid stenosis. Aortic Valve: The aortic valve is normal in structure. There is mild calcification of the aortic valve. There is mild thickening of the aortic valve. Aortic valve regurgitation is not visualized.  Aortic valve sclerosis is present, with no evidence of aortic valve stenosis. Aortic valve mean gradient measures 4.0 mmHg. Aortic valve peak gradient measures 8.4 mmHg. Aortic valve area, by VTI measures 2.10 cm. Pulmonic Valve: The pulmonic valve was normal in structure. Pulmonic valve regurgitation is not visualized. No evidence of pulmonic stenosis. Aorta: The aortic root is normal in size and structure. Venous: The inferior vena cava is normal in size with greater than 50% respiratory variability, suggesting right atrial pressure of 3 mmHg. IAS/Shunts: No atrial level shunt detected by color flow Doppler.  LEFT VENTRICLE PLAX 2D LVIDd:         4.30 cm   Diastology LVIDs:         2.60 cm   LV e' medial:    6.99 cm/s LV PW:         1.00 cm   LV E/e' medial:  10.6 LV IVS:        0.90 cm   LV e' lateral:   8.86 cm/s LVOT diam:     2.10 cm   LV E/e' lateral: 8.4 LV SV:         52 LV SV Index:   28 LVOT Area:     3.46 cm  RIGHT VENTRICLE RV Basal diam:  3.20 cm RV S prime:     13.50 cm/s TAPSE (M-mode): 2.2 cm LEFT ATRIUM             Index  RIGHT ATRIUM           Index LA diam:        3.50 cm 1.92 cm/m   RA Area:     15.70 cm LA Vol (A2C):   25.2 ml 13.80 ml/m  RA Volume:   42.80 ml  23.43 ml/m LA Vol (A4C):   26.0 ml 14.23 ml/m LA Biplane Vol: 26.6 ml 14.56 ml/m  AORTIC VALVE AV Area (Vmax):    2.32 cm AV Area (Vmean):   2.28 cm AV Area (VTI):     2.10 cm AV Vmax:           145.00 cm/s AV Vmean:          93.000 cm/s AV VTI:            0.247 m AV Peak Grad:      8.4 mmHg AV Mean Grad:      4.0 mmHg LVOT Vmax:         97.30 cm/s LVOT Vmean:        61.100 cm/s LVOT VTI:          0.150 m LVOT/AV VTI ratio: 0.61  AORTA Ao Root diam: 3.40 cm Ao Asc diam:  2.70 cm MITRAL VALVE MV Area (PHT): 3.65 cm    SHUNTS MV Decel Time: 208 msec    Systemic VTI:  0.15 m MV E velocity: 74.20 cm/s  Systemic Diam: 2.10 cm MV A velocity: 85.90 cm/s MV E/A ratio:  0.86 Mihai Croitoru MD Electronically signed by Sanda Klein MD Signature Date/Time: 05/13/2022/3:32:43 PM    Final    VAS [place] LOWER EXTREMITY VENOUS (DVT)  Result Date: 05/13/2022  Lower Venous DVT Study Patient Name:  Bradley Colon  Date of Exam:   05/12/2022 Medical Rec #: [id number]   Accession #:    [id number] Date of Birth: 26-Aug-1971  Patient Gender: M Patient Age:   70 years Exam Location:  [facility] Procedure:      VAS [place] LOWER EXTREMITY VENOUS (DVT) Referring Phys: Oren Binet --------------------------------------------------------------------------------  Indications: Pulmonary embolism.  Comparison Study: No previous exam noted. Performing Technologist: Bobetta Lime BS, RVT  Examination Guidelines: A complete evaluation includes B-mode imaging, spectral Doppler, color Doppler, and power Doppler as needed of all accessible portions of each vessel. Bilateral testing is considered an integral part of a complete examination. Limited examinations for reoccurring indications may be performed as noted. The reflux portion of the exam is performed with the patient in reverse Trendelenburg.  +---------+---------------+---------+-----------+----------+--------------+ RIGHT    CompressibilityPhasicitySpontaneityPropertiesThrombus Aging +---------+---------------+---------+-----------+----------+--------------+ CFV      Full           Yes      Yes                                 +---------+---------------+---------+-----------+----------+--------------+ SFJ      Full                                                        +---------+---------------+---------+-----------+----------+--------------+ FV Prox  Full                                                        +---------+---------------+---------+-----------+----------+--------------+  FV Mid   Full                                                        +---------+---------------+---------+-----------+----------+--------------+ FV DistalFull                                                         +---------+---------------+---------+-----------+----------+--------------+ PFV      Full                                                        +---------+---------------+---------+-----------+----------+--------------+ POP      Full           Yes      Yes                                 +---------+---------------+---------+-----------+----------+--------------+ PTV      Full                                                        +---------+---------------+---------+-----------+----------+--------------+ PERO     Full                                                        +---------+---------------+---------+-----------+----------+--------------+   +---------+---------------+---------+-----------+----------+--------------+ LEFT     CompressibilityPhasicitySpontaneityPropertiesThrombus Aging +---------+---------------+---------+-----------+----------+--------------+ CFV      Full           Yes      Yes                                 +---------+---------------+---------+-----------+----------+--------------+ SFJ      Full                                                        +---------+---------------+---------+-----------+----------+--------------+ FV Prox  Full                                                        +---------+---------------+---------+-----------+----------+--------------+ FV Mid   Full                                                        +---------+---------------+---------+-----------+----------+--------------+  FV DistalFull                                                        +---------+---------------+---------+-----------+----------+--------------+ PFV      Full                                                        +---------+---------------+---------+-----------+----------+--------------+ POP      Full           Yes      Yes                                  +---------+---------------+---------+-----------+----------+--------------+ PTV      Full                                                        +---------+---------------+---------+-----------+----------+--------------+ PERO     Full                                                        +---------+---------------+---------+-----------+----------+--------------+     Summary: BILATERAL: - No evidence of deep vein thrombosis seen in the lower extremities, bilaterally. -No evidence of popliteal cyst, bilaterally.   *See table(s) above for measurements and observations. Electronically signed by Servando Snare MD on 05/13/2022 at 2:58:23 PM.    Final    NM Pulmonary Perfusion  Result Date: 05/12/2022 CLINICAL DATA:  Pulmonary embolism (PE) suspected, positive D-dimer EXAM: NUCLEAR MEDICINE PERFUSION LUNG SCAN TECHNIQUE: Perfusion images were obtained in multiple projections after intravenous injection of radiopharmaceutical. Ventilation scans intentionally deferred if perfusion scan and chest x-ray adequate for interpretation during COVID 19 epidemic. RADIOPHARMACEUTICALS:  4.3 mCi Tc-61m MAA IV COMPARISON:  Chest x-ray 05/12/2022 FINDINGS: Heterogeneous distribution of radiotracer within both lung fields with multiple superimposed mismatched wedge-shaped segmental perfusion defects bilaterally. IMPRESSION: High probability for pulmonary embolism. These results will be called to the ordering clinician or representative by the Radiologist Assistant, and communication documented in the PACS or [employer]. Electronically Signed   By: Davina Poke D.O.   On: 05/12/2022 11:04   DG CHEST PORT 1 VIEW  Result Date: 05/12/2022 CLINICAL DATA:  Shortness of breath, hypoxia, cancer EXAM: PORTABLE CHEST 1 VIEW COMPARISON:  Radiograph 05/10/2022 FINDINGS: Chest port catheter tip overlies the right atrium. Unchanged cardiomediastinal silhouette. There are innumerable pulmonary metastases as seen on prior  exams. There is no definite new airspace disease. There are trace pleural effusions. No pneumothorax. No acute osseous abnormality. IMPRESSION: Extensive pulmonary metastases.  No definite new airspace disease. Electronically Signed   By: Maurine Simmering M.D.   On: 05/12/2022 08:33   CT Chest Wo Contrast  Result Date: 05/11/2022 CLINICAL DATA:  History of metastatic lung carcinoma EXAM: CT CHEST WITHOUT CONTRAST TECHNIQUE: Multidetector  CT imaging of the chest was performed following the standard protocol without IV contrast. RADIATION DOSE REDUCTION: This exam was performed according to the departmental dose-optimization program which includes automated exposure control, adjustment of the mA and/or kV according to patient size and/or use of iterative reconstruction technique. COMPARISON:  Chest x-ray from earlier in the same day, CT of the chest from 07/17/2021. FINDINGS: Cardiovascular: Somewhat limited due to lack of IV contrast. Right chest wall port is noted in satisfactory position. Atherosclerotic calcifications of the thoracic aorta are noted. Generalized decreased attenuation is noted in the cardiac blood pool likely related to anemia. Pulmonary artery as visualized is within normal limits. Mediastinum/Nodes: Thoracic inlet is unremarkable. Multiple mediastinal lymph nodes are identified some of which demonstrates some calcification these have increased in both size and number when compared with the prior exam. Pretracheal node measures 18 mm in short axis. Subcarinal node measures 18 mm in short axis as well. Scattered hilar nodes are seen also increased from the prior exam. Esophagus is within normal limits. Lungs/Pleura: Small effusion is noted bilaterally. Multiple pulmonary nodules are identified throughout both lungs significantly increased in both size and number when compared with the prior exam. Dominant lesion in the left upper lobe measures up to 2.8 cm. Dominant lesion on the right in the lower  lobe measures approximately 2.9 cm in greatest dimension. Upper Abdomen: Upper abdomen is limited by the lack of IV contrast although a large geographic area of decreased attenuation is noted in the right lobe of the liver measuring 11 x 6.8 cm this would also be consistent with hepatic metastatic disease increased from prior exams. Musculoskeletal: Degenerative changes of the thoracic spine are seen. No rib fracture is noted. No definitive metastatic disease is seen. IMPRESSION: Significant increase in both size and number of pulmonary metastatic lesions as well as increase in hilar and mediastinal adenopathy when compared with the prior exam of September of 2022. Large area of decreased attenuation is noted in the posterior aspect of the right lobe of the liver as described consistent with progressive hepatic metastatic disease. Evaluation is somewhat limited due to the lack of IV contrast. Aortic Atherosclerosis (ICD10-I70.0). Electronically Signed   By: Inez Catalina M.D.   On: 05/11/2022 00:00   DG Chest 2 View  Result Date: 05/10/2022 CLINICAL DATA:  Shortness of breath EXAM: CHEST - 2 VIEW COMPARISON:  Chest x-ray 12/28/2021 FINDINGS: Right-sided central venous port stable. Heart size is normal. Mediastinum appears unchanged. Interval increased diffuse ill-defined rounded nodular opacities seen throughout both lungs consistent with known metastases. Largest lobulated/nodular masslike opacity in the right lateral lung is enlarged since previous study as well. Likely trace right pleural effusion. No pneumothorax. IMPRESSION: Innumerable pulmonary nodular opacities bilaterally as described consistent with known metastases, increased since previous study. Superimposed pneumonia infiltrates can not be excluded. Electronically Signed   By: Ofilia Neas M.D.   On: 05/10/2022 21:00     TODAY-DAY OF DISCHARGE:  Subjective:   Gillis Messineo today remains stable on 2-3 L of O2  Objective:   Blood pressure  107/63, pulse 80, temperature 98.3 F (36.8 C), temperature source Oral, resp. rate 16, height 5\' 7"  (1.702 m), weight 71.4 kg, SpO2 99 %. No intake or output data in the 24 hours ending 05/13/22 1701 Filed Weights   05/10/22 2215 05/12/22 0022  Weight: 68 kg 71.4 kg    Exam: Awake Alert, Oriented *3, No new F.N deficits, Normal affect [place].AT,PERRAL Supple Neck,No JVD, No cervical lymphadenopathy  appriciated.  Symmetrical Chest wall movement, Good air movement bilaterally, CTAB RRR,No Gallops,Rubs or new Murmurs, No Parasternal Heave +ve B.Sounds, Abd Soft, Non tender, No organomegaly appriciated, No rebound -guarding or rigidity. No Cyanosis, Clubbing or edema, No new Rash or bruise   PERTINENT RADIOLOGIC STUDIES: ECHOCARDIOGRAM COMPLETE  Result Date: 05/13/2022    ECHOCARDIOGRAM REPORT   Patient Name:   Bradley Colon Date of Exam: 05/13/2022 Medical Rec #:  [id number]  Height:       67.0 in Accession #:    [id number] Weight:       157.4 lb Date of Birth:  February 03, 1971 BSA:          1.827 m Patient Age:    7 years   BP:           102/65 mmHg Patient Gender: M          HR:           85 bpm. Exam Location:  Inpatient Procedure: 2D Echo, Cardiac Doppler and Color Doppler Indications:    Pulmonary Embolus I26.09  History:        Patient has no prior history of Echocardiogram examinations. VQ                 Scan 05/12/22 which showed high probability of pulmonary embolus;                 Signs/Symptoms:Shortness of Breath. Metastatic rectal cancer                 with history of chemotherapy stopped due to renal failure.  Sonographer:    Merrie Roof RDCS Referring Phys: [place]  1. Left ventricular ejection fraction, by estimation, is 60 to 65%. The left ventricle has normal function. The left ventricle has no regional wall motion abnormalities. Left ventricular diastolic parameters are consistent with Grade I diastolic dysfunction (impaired relaxation).  2. Right ventricular  systolic function is normal. The right ventricular size is normal.  3. The mitral valve is normal in structure. No evidence of mitral valve regurgitation. No evidence of mitral stenosis.  4. The aortic valve is normal in structure. There is mild calcification of the aortic valve. There is mild thickening of the aortic valve. Aortic valve regurgitation is not visualized. Aortic valve sclerosis is present, with no evidence of aortic valve stenosis.  5. The inferior vena cava is normal in size with greater than 50% respiratory variability, suggesting right atrial pressure of 3 mmHg. FINDINGS  Left Ventricle: Left ventricular ejection fraction, by estimation, is 60 to 65%. The left ventricle has normal function. The left ventricle has no regional wall motion abnormalities. The left ventricular internal cavity size was normal in size. There is  no left ventricular hypertrophy. Left ventricular diastolic parameters are consistent with Grade I diastolic dysfunction (impaired relaxation). Normal left ventricular filling pressure. Right Ventricle: The right ventricular size is normal. No increase in right ventricular wall thickness. Right ventricular systolic function is normal. Left Atrium: Left atrial size was normal in size. Right Atrium: Right atrial size was normal in size. Pericardium: There is no evidence of pericardial effusion. Mitral Valve: The mitral valve is normal in structure. No evidence of mitral valve regurgitation. No evidence of mitral valve stenosis. Tricuspid Valve: The tricuspid valve is normal in structure. Tricuspid valve regurgitation is not demonstrated. No evidence of tricuspid stenosis. Aortic Valve: The aortic valve is normal in structure. There is mild calcification of the aortic valve. There  is mild thickening of the aortic valve. Aortic valve regurgitation is not visualized. Aortic valve sclerosis is present, with no evidence of aortic valve stenosis. Aortic valve mean gradient measures 4.0  mmHg. Aortic valve peak gradient measures 8.4 mmHg. Aortic valve area, by VTI measures 2.10 cm. Pulmonic Valve: The pulmonic valve was normal in structure. Pulmonic valve regurgitation is not visualized. No evidence of pulmonic stenosis. Aorta: The aortic root is normal in size and structure. Venous: The inferior vena cava is normal in size with greater than 50% respiratory variability, suggesting right atrial pressure of 3 mmHg. IAS/Shunts: No atrial level shunt detected by color flow Doppler.  LEFT VENTRICLE PLAX 2D LVIDd:         4.30 cm   Diastology LVIDs:         2.60 cm   LV e' medial:    6.99 cm/s LV PW:         1.00 cm   LV E/e' medial:  10.6 LV IVS:        0.90 cm   LV e' lateral:   8.86 cm/s LVOT diam:     2.10 cm   LV E/e' lateral: 8.4 LV SV:         52 LV SV Index:   28 LVOT Area:     3.46 cm  RIGHT VENTRICLE RV Basal diam:  3.20 cm RV S prime:     13.50 cm/s TAPSE (M-mode): 2.2 cm LEFT ATRIUM             Index        RIGHT ATRIUM           Index LA diam:        3.50 cm 1.92 cm/m   RA Area:     15.70 cm LA Vol (A2C):   25.2 ml 13.80 ml/m  RA Volume:   42.80 ml  23.43 ml/m LA Vol (A4C):   26.0 ml 14.23 ml/m LA Biplane Vol: 26.6 ml 14.56 ml/m  AORTIC VALVE AV Area (Vmax):    2.32 cm AV Area (Vmean):   2.28 cm AV Area (VTI):     2.10 cm AV Vmax:           145.00 cm/s AV Vmean:          93.000 cm/s AV VTI:            0.247 m AV Peak Grad:      8.4 mmHg AV Mean Grad:      4.0 mmHg LVOT Vmax:         97.30 cm/s LVOT Vmean:        61.100 cm/s LVOT VTI:          0.150 m LVOT/AV VTI ratio: 0.61  AORTA Ao Root diam: 3.40 cm Ao Asc diam:  2.70 cm MITRAL VALVE MV Area (PHT): 3.65 cm    SHUNTS MV Decel Time: 208 msec    Systemic VTI:  0.15 m MV E velocity: 74.20 cm/s  Systemic Diam: 2.10 cm MV A velocity: 85.90 cm/s MV E/A ratio:  0.86 Mihai Croitoru MD Electronically signed by Sanda Klein MD Signature Date/Time: 05/13/2022/3:32:43 PM    Final    VAS [place] LOWER EXTREMITY VENOUS (DVT)  Result Date:  05/13/2022  Lower Venous DVT Study Patient Name:  Bradley Colon  Date of Exam:   05/12/2022 Medical Rec #: [id number]   Accession #:    [id number] Date of Birth: [birth year]  Patient Gender: M Patient Age:  50 years Exam Location[facility] Procedure:      VAS [place] LOWER EXTREMITY VENOUS (DVT) Referring Phys: Oren Binet --------------------------------------------------------------------------------  Indications: Pulmonary embolism.  Comparison Study: No previous exam noted. Performing Technologist: Bobetta Lime BS, RVT  Examination Guidelines: A complete evaluation includes B-mode imaging, spectral Doppler, color Doppler, and power Doppler as needed of all accessible portions of each vessel. Bilateral testing is considered an integral part of a complete examination. Limited examinations for reoccurring indications may be performed as noted. The reflux portion of the exam is performed with the patient in reverse Trendelenburg.  +---------+---------------+---------+-----------+----------+--------------+ RIGHT    CompressibilityPhasicitySpontaneityPropertiesThrombus Aging +---------+---------------+---------+-----------+----------+--------------+ CFV      Full           Yes      Yes                                 +---------+---------------+---------+-----------+----------+--------------+ SFJ      Full                                                        +---------+---------------+---------+-----------+----------+--------------+ FV Prox  Full                                                        +---------+---------------+---------+-----------+----------+--------------+ FV Mid   Full                                                        +---------+---------------+---------+-----------+----------+--------------+ FV DistalFull                                                        +---------+---------------+---------+-----------+----------+--------------+ PFV       Full                                                        +---------+---------------+---------+-----------+----------+--------------+ POP      Full           Yes      Yes                                 +---------+---------------+---------+-----------+----------+--------------+ PTV      Full                                                        +---------+---------------+---------+-----------+----------+--------------+ PERO     Full                                                        +---------+---------------+---------+-----------+----------+--------------+   +---------+---------------+---------+-----------+----------+--------------+  LEFT     CompressibilityPhasicitySpontaneityPropertiesThrombus Aging +---------+---------------+---------+-----------+----------+--------------+ CFV      Full           Yes      Yes                                 +---------+---------------+---------+-----------+----------+--------------+ SFJ      Full                                                        +---------+---------------+---------+-----------+----------+--------------+ FV Prox  Full                                                        +---------+---------------+---------+-----------+----------+--------------+ FV Mid   Full                                                        +---------+---------------+---------+-----------+----------+--------------+ FV DistalFull                                                        +---------+---------------+---------+-----------+----------+--------------+ PFV      Full                                                        +---------+---------------+---------+-----------+----------+--------------+ POP      Full           Yes      Yes                                 +---------+---------------+---------+-----------+----------+--------------+ PTV      Full                                                         +---------+---------------+---------+-----------+----------+--------------+ PERO     Full                                                        +---------+---------------+---------+-----------+----------+--------------+     Summary: BILATERAL: - No evidence of deep vein thrombosis seen in the lower extremities, bilaterally. -No evidence of popliteal cyst, bilaterally.   *See table(s) above for measurements and observations. Electronically signed by Servando Snare MD on 05/13/2022 at 2:58:23 PM.  Final    NM Pulmonary Perfusion  Result Date: 05/12/2022 CLINICAL DATA:  Pulmonary embolism (PE) suspected, positive D-dimer EXAM: NUCLEAR MEDICINE PERFUSION LUNG SCAN TECHNIQUE: Perfusion images were obtained in multiple projections after intravenous injection of radiopharmaceutical. Ventilation scans intentionally deferred if perfusion scan and chest x-ray adequate for interpretation during COVID 19 epidemic. RADIOPHARMACEUTICALS:  4.3 mCi Tc-19m MAA IV COMPARISON:  Chest x-ray 05/12/2022 FINDINGS: Heterogeneous distribution of radiotracer within both lung fields with multiple superimposed mismatched wedge-shaped segmental perfusion defects bilaterally. IMPRESSION: High probability for pulmonary embolism. These results will be called to the ordering clinician or representative by the Radiologist Assistant, and communication documented in the PACS or [employer]. Electronically Signed   By: Davina Poke D.O.   On: 05/12/2022 11:04   DG CHEST PORT 1 VIEW  Result Date: 05/12/2022 CLINICAL DATA:  Shortness of breath, hypoxia, cancer EXAM: PORTABLE CHEST 1 VIEW COMPARISON:  Radiograph 05/10/2022 FINDINGS: Chest port catheter tip overlies the right atrium. Unchanged cardiomediastinal silhouette. There are innumerable pulmonary metastases as seen on prior exams. There is no definite new airspace disease. There are trace pleural effusions. No pneumothorax. No acute osseous abnormality.  IMPRESSION: Extensive pulmonary metastases.  No definite new airspace disease. Electronically Signed   By: Maurine Simmering M.D.   On: 05/12/2022 08:33     PERTINENT LAB RESULTS: CBC: Recent Labs    05/12/22 0201 05/13/22 0300  WBC 11.7* 11.9*  HGB 7.3* 7.6*  HCT 23.8* 24.0*  PLT 520* 555*   CMET CMP     Component Value Date/Time   NA 133 (L) 05/13/2022 0300   NA 135 (A) 02/24/2022 0000   K 4.4 05/13/2022 0300   CL 100 05/13/2022 0300   CO2 21 (L) 05/13/2022 0300   GLUCOSE 100 (H) 05/13/2022 0300   BUN 39 (H) 05/13/2022 0300   BUN 40 (A) 02/24/2022 0000   CREATININE 3.08 (H) 05/13/2022 0300   CREATININE 3.17 (HH) 04/16/2022 1412   CREATININE 1.10 07/22/2016 1540   CALCIUM 10.2 05/13/2022 0300   PROT 9.1 (H) 04/16/2022 1412   PROT 8.4 01/14/2022 1454   ALBUMIN 2.3 (L) 05/13/2022 0300   ALBUMIN 4.3 01/14/2022 1454   AST 26 04/16/2022 1412   ALT 17 04/16/2022 1412   ALKPHOS 137 (H) 04/16/2022 1412   BILITOT 0.5 04/16/2022 1412   GFRNONAA 23 (L) 05/13/2022 0300   GFRNONAA 23 (L) 04/16/2022 1412   GFRNONAA 80 07/22/2016 1540   GFRAA >60 01/02/2020 2046   GFRAA >89 07/22/2016 1540    GFR Estimated Creatinine Clearance: 26.2 mL/min (A) (by C-G formula based on SCr of 3.08 mg/dL (H)). No results for input(s): "LIPASE", "AMYLASE" in the last 72 hours. No results for input(s): "CKTOTAL", "CKMB", "CKMBINDEX", "TROPONINI" in the last 72 hours. Invalid input(s): "POCBNP" Recent Labs    05/10/22 2006  DDIMER 12.60*   No results for input(s): "HGBA1C" in the last 72 hours. No results for input(s): "CHOL", "HDL", "LDLCALC", "TRIG", "CHOLHDL", "LDLDIRECT" in the last 72 hours. No results for input(s): "TSH", "T4TOTAL", "T3FREE", "THYROIDAB" in the last 72 hours.  Invalid input(s): "FREET3" No results for input(s): "VITAMINB12", "FOLATE", "FERRITIN", "TIBC", "IRON", "RETICCTPCT" in the last 72 hours. Coags: No results for input(s): "INR" in the last 72 hours.  Invalid  input(s): "PT" Microbiology: Recent Results (from the past 240 hour(s))  Urine Culture     Status: Abnormal   Collection Time: 05/10/22  9:57 PM   Specimen: Urine, Catheterized  Result Value Ref Range Status  Specimen Description URINE, CATHETERIZED  Final   Special Requests   Final    NONE Performed at [facility], [place] [address] [place], [place] [address]    Culture (A)  Final    >=100,000 COLONIES/mL KLEBSIELLA PNEUMONIAE >=100,000 COLONIES/mL ESCHERICHIA COLI    Report Status 05/13/2022 FINAL  Final   Organism ID, Bacteria KLEBSIELLA PNEUMONIAE (A)  Final   Organism ID, Bacteria ESCHERICHIA COLI (A)  Final      Susceptibility   Escherichia coli - MIC*    AMPICILLIN >=32 RESISTANT Resistant     CEFAZOLIN >=64 RESISTANT Resistant     CEFEPIME <=0.12 SENSITIVE Sensitive     CEFTRIAXONE 32 RESISTANT Resistant     CIPROFLOXACIN <=0.25 SENSITIVE Sensitive     GENTAMICIN <=1 SENSITIVE Sensitive     IMIPENEM <=0.25 SENSITIVE Sensitive     NITROFURANTOIN <=16 SENSITIVE Sensitive     TRIMETH/SULFA <=20 SENSITIVE Sensitive     AMPICILLIN/SULBACTAM 16 INTERMEDIATE Intermediate     PIP/TAZO <=4 SENSITIVE Sensitive     * >=100,000 COLONIES/mL ESCHERICHIA COLI   Klebsiella pneumoniae - MIC*    AMPICILLIN >=32 RESISTANT Resistant     CEFAZOLIN <=4 SENSITIVE Sensitive     CEFEPIME <=0.12 SENSITIVE Sensitive     CEFTRIAXONE <=0.25 SENSITIVE Sensitive     CIPROFLOXACIN <=0.25 SENSITIVE Sensitive     GENTAMICIN <=1 SENSITIVE Sensitive     IMIPENEM <=0.25 SENSITIVE Sensitive     NITROFURANTOIN 32 SENSITIVE Sensitive     TRIMETH/SULFA <=20 SENSITIVE Sensitive     AMPICILLIN/SULBACTAM 4 SENSITIVE Sensitive     PIP/TAZO <=4 SENSITIVE Sensitive     * >=100,000 COLONIES/mL KLEBSIELLA PNEUMONIAE  Blood culture (routine x 2)     Status: None (Preliminary result)   Collection Time: 05/10/22 10:14 PM   Specimen: BLOOD  Result Value Ref Range Status   Specimen Description BLOOD LEFT  ANTECUBITAL  Final   Special Requests   Final    BOTTLES DRAWN AEROBIC AND ANAEROBIC Blood Culture adequate volume   Culture   Final    NO GROWTH 2 DAYS Performed at [facility], [address] [place], [place] [address]    Report Status PENDING  Incomplete  Blood culture (routine x 2)     Status: None (Preliminary result)   Collection Time: 05/10/22 10:19 PM   Specimen: BLOOD  Result Value Ref Range Status   Specimen Description BLOOD RIGHT ANTECUBITAL  Final   Special Requests   Final    BOTTLES DRAWN AEROBIC AND ANAEROBIC Blood Culture adequate volume   Culture   Final    NO GROWTH 2 DAYS Performed at [facility], [place] [address], [place],  [address]    Report Status PENDING  Incomplete    FURTHER DISCHARGE INSTRUCTIONS:  Get Medicines reviewed and adjusted: Please take all your medications with you for your next visit with your Primary MD  Laboratory/radiological data: Please request your Primary MD to go over all hospital tests and procedure/radiological results at the follow up, please ask your Primary MD to get all Hospital records sent to his/her office.  In some cases, they will be blood work, cultures and biopsy results pending at the time of your discharge. Please request that your primary care M.D. goes through all the records of your hospital data and follows up on these results.  Also Note the following: If you experience worsening of your admission symptoms, develop shortness of breath, life threatening emergency, suicidal or  homicidal thoughts you must seek medical attention immediately by calling 911 or calling your MD immediately  if symptoms less severe.  You must read complete instructions/literature along with all the possible adverse reactions/side effects for all the Medicines you take and that have been prescribed to you. Take any new Medicines after you have completely understood and accpet all the possible adverse reactions/side  effects.   Do not drive when taking Pain medications or sleeping medications (Benzodaizepines)  Do not take more than prescribed Pain, Sleep and Anxiety Medications. It is not advisable to combine anxiety,sleep and pain medications without talking with your primary care practitioner  Special Instructions: If you have smoked or chewed Tobacco  in the last 2 yrs please stop smoking, stop any regular Alcohol  and or any Recreational drug use.  Wear Seat belts while driving.  Please note: You were cared for by a hospitalist during your hospital stay. Once you are discharged, your primary care physician will handle any further medical issues. Please note that NO REFILLS for any discharge medications will be authorized once you are discharged, as it is imperative that you return to your primary care physician (or establish a relationship with a primary care physician if you do not have one) for your post hospital discharge needs so that they can reassess your need for medications and monitor your lab values.  Total Time spent coordinating discharge including counseling, education and face to face time equals greater than 30 minutes.  SignedOren Binet 05/13/2022 5:01 PM

## 2022-05-13 NOTE — Evaluation (Signed)
Occupational Therapy Evaluation Patient Details Name: Bradley Colon MRN: [id number] DOB: [birth year] Today's Date: 05/13/2022   History of Present Illness Pt is a 51 y/o male admitted secondary to SOB, acute respiratory failure with hypoxia secondary to lung mets and CAP. 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   Clinical Impression   Pt independent at baseline with ADLs and functional mobility, lives with family however states he may stay with his mom at d/c. Pt currently mod I -min A for ADLs, mod I for bed mobility and transfers without AD. Pt with decreased activity tolerance and increased anxiety with SOB. Educated pt on breathing techniques, and energy conservation strategies for home. Pt verbalized understanding. VSS 3L O2 at rest, needing an increase to 6-8L while standing to self cath, SpO2 above 95%. Pt presenting with impairments listed below, will follow. Recommend HHOT at d/c.     Recommendations for follow up therapy are one component of a multi-disciplinary discharge planning process, led by the attending physician.  Recommendations may be updated based on patient status, additional functional criteria and insurance authorization.   Follow Up Recommendations  Home health OT    Assistance Recommended at Discharge Intermittent Supervision/Assistance  Patient can return home with the following Assist for transportation;Help with stairs or ramp for entrance;A little help with walking and/or transfers;A little help with bathing/dressing/bathroom;Assistance with cooking/housework    Functional Status Assessment  Patient has had a recent decline in their functional status and demonstrates the ability to make significant improvements in function in a  reasonable and predictable amount of time.  Equipment Recommendations  BSC/3in1 (as shower seat)    Recommendations for Other Services PT consult     Precautions / Restrictions Precautions Precautions: Fall Precaution Comments: self caths Restrictions Weight Bearing Restrictions: No      Mobility Bed Mobility Overal bed mobility: Modified Independent                  Transfers Overall transfer level: Modified independent Equipment used: None                      Balance Overall balance assessment: Needs assistance Sitting-balance support: Feet supported Sitting balance-Leahy Scale: Normal     Standing balance support: During functional activity, No upper extremity supported Standing balance-Leahy Scale: Good                             ADL either performed or assessed with clinical judgement   ADL Overall ADL's : Needs assistance/impaired Eating/Feeding: Modified independent   Grooming: Modified independent   Upper Body Bathing: Modified independent   Lower Body Bathing: Minimal assistance   Upper Body Dressing : Modified independent   Lower Body Dressing: Minimal assistance   Toilet Transfer: Modified Independent   Toileting- Clothing Manipulation and Hygiene: Modified independent Toileting - Clothing Manipulation Details (indicate cue type and reason): self caths     Functional mobility during ADLs: Modified independent       Vision   Vision Assessment?: No apparent visual deficits     Perception     Praxis      Pertinent Vitals/Pain Pain Assessment Pain Assessment: Faces Pain Score: 4  Faces Pain Scale: Hurts little more Pain Location: RLE Pain Descriptors /  Indicators: Sore Pain Intervention(s): Limited activity within patient's tolerance, Repositioned     Hand Dominance     Extremity/Trunk Assessment Upper Extremity Assessment Upper Extremity Assessment: Generalized weakness   Lower Extremity  Assessment Lower Extremity Assessment: Defer to PT evaluation   Cervical / Trunk Assessment Cervical / Trunk Assessment: Normal   Communication     Cognition Arousal/Alertness: Awake/alert Behavior During Therapy: WFL for tasks assessed/performed, Restless, Anxious Overall Cognitive Status: Within Functional Limits for tasks assessed                                 General Comments: very anxious regarding symptoms and shortness of breath     General Comments  VSS on supplemental O2    Exercises     Shoulder Instructions      Home Living Family/patient expects to be discharged to:: Private residence Living Arrangements: Spouse/significant other;Children Available Help at Discharge: Family Type of Home: House Home Access: Level entry     Home Layout: One level     Bathroom Shower/Tub: Walk-in shower         Home Equipment: Radio producer - single point          Prior Functioning/Environment Prior Level of Function : Independent/Modified Independent             Mobility Comments: only uses cane when fatigued ADLs Comments: self-caths        OT Problem List: Decreased strength;Decreased activity tolerance;Decreased range of motion;Cardiopulmonary status limiting activity      OT Treatment/Interventions: Self-care/ADL training;Therapeutic exercise;Energy conservation;Therapeutic activities;Patient/family education;Balance training;DME and/or AE instruction    OT Goals(Current goals can be found in the care plan section) Acute Rehab OT Goals Patient Stated Goal: none stated OT Goal Formulation: With patient Time For Goal Achievement: 05/27/22 Potential to Achieve Goals: Good ADL Goals Pt Will Perform Tub/Shower Transfer: Tub transfer;Shower transfer;ambulating;3 in 1;with supervision Additional ADL Goal #1: Pt will verbalize 3 energy conservation strategies to improve independence with ADLs  OT Frequency: Min 2X/week    Co-evaluation               AM-PAC OT "6 Clicks" Daily Activity     Outcome Measure Help from another person eating meals?: None Help from another person taking care of personal grooming?: None Help from another person toileting, which includes using toliet, bedpan, or urinal?: None Help from another person bathing (including washing, rinsing, drying)?: A Little Help from another person to put on and taking off regular upper body clothing?: None Help from another person to put on and taking off regular lower body clothing?: A Little 6 Click Score: 22   End of Session Equipment Utilized During Treatment: Gait belt;Oxygen Nurse Communication: Mobility status  Activity Tolerance: Patient tolerated treatment well Patient left: in bed;with call bell/phone within reach;with bed alarm set  OT Visit Diagnosis: Unsteadiness on feet (R26.81);Other abnormalities of gait and mobility (R26.89);Muscle weakness (generalized) (M62.81)                Time: 9373-4287 OT Time Calculation (min): 36 min Charges:  OT General Charges $OT Visit: 1 Visit OT Evaluation $OT Eval Low Complexity: 1 Low OT Treatments $Self Care/Home Management : 8-22 mins  Lynnda Child, OTD, OTR/L Acute Rehab [phone]) [phone] [place] 05/13/2022, 1:22 PM

## 2022-05-13 NOTE — Evaluation (Signed)
Physical Therapy Evaluation Patient Details Name: Bradley Colon MRN: [id number] DOB: Feb 03, 1971 Today's Date: 05/13/2022  History of Present Illness  Pt is a 51 y/o male admitted secondary to SOB, acute respiratory failure with hypoxia secondary to lung mets and CAP. 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  Clinical Impression  Pt presented supine in bed with HOB elevated, awake and initially refusing participation secondary to pain and inability to sleep last night. However, he then expressed that he needed to get up to self-cath and was agreeable to limited amount of mobility. Prior to admission, pt reported that he was independent with all functional mobility and ADLs. Pt initially on 3L of O2 upon PT arrival with SpO2 in the mid to high 90's. He stated that when he gets up to self-cath he has been titrating his O2 up to 6L secondary to increasing dyspnea with minimal activity. Pt able to complete bed mobility and transfers independently. He ambulated a very short distance in his room with supervision without use of an AD. He demonstrated good sitting and standing balance with no reports of increased pain. PT will continue to f/u with pt acutely to progress mobility as tolerated and to ensure a safe d/c home when medically appropriate.      Recommendations for follow up therapy are one component of a multi-disciplinary discharge planning process, led by the attending physician.  Recommendations may be updated based on patient status, additional functional criteria and insurance authorization.  Follow Up Recommendations No PT follow up      Assistance Recommended at Discharge Set up Supervision/Assistance  Patient can return home with the following  Help with stairs  or ramp for entrance    Equipment Recommendations None recommended by PT  Recommendations for Other Services       Functional Status Assessment Patient has had a recent decline in their functional status and demonstrates the ability to make significant improvements in function in a reasonable and predictable amount of time.     Precautions / Restrictions Precautions Precautions: Fall Precaution Comments: self caths Restrictions Weight Bearing Restrictions: No      Mobility  Bed Mobility Overal bed mobility: Independent                  Transfers Overall transfer level: Independent Equipment used: None                    Ambulation/Gait Ambulation/Gait assistance: Supervision Gait Distance (Feet): 3 Feet Assistive device: None Gait Pattern/deviations: Step-through pattern       General Gait Details: pt able to manage lines from O2 and vitals monitor independently while walking a short distance in his room to find a spot to allow him to self-cath; limited in distance secondary to this urgency and requesting privacy  Stairs            Wheelchair Mobility    Modified Rankin (Stroke Patients Only)       Balance Overall balance assessment: Needs assistance Sitting-balance support: Feet supported Sitting balance-Leahy Scale: Normal     Standing balance support: During functional activity, No upper extremity supported Standing balance-Leahy Scale: Good  Pertinent Vitals/Pain Pain Assessment Pain Assessment: Faces Faces Pain Scale: Hurts little more Pain Location: R hip Pain Descriptors / Indicators: Sore Pain Intervention(s): Monitored during session, Repositioned    Home Living Family/patient expects to be discharged to:: Private residence Living Arrangements: Spouse/significant other Available Help at Discharge: Family Type of Home: House Home Access: Level entry       Home Layout: One  level Home Equipment: None      Prior Function Prior Level of Function : Independent/Modified Independent               ADLs Comments: self-caths     Hand Dominance        Extremity/Trunk Assessment   Upper Extremity Assessment Upper Extremity Assessment: Defer to OT evaluation    Lower Extremity Assessment Lower Extremity Assessment: Generalized weakness    Cervical / Trunk Assessment Cervical / Trunk Assessment: Normal  Communication   Communication: No difficulties  Cognition Arousal/Alertness: Awake/alert Behavior During Therapy: WFL for tasks assessed/performed Overall Cognitive Status: Within Functional Limits for tasks assessed                                          General Comments      Exercises     Assessment/Plan    PT Assessment Patient needs continued PT services  PT Problem List Decreased mobility;Pain       PT Treatment Interventions DME instruction;Gait training;Stair training;Functional mobility training;Therapeutic activities;Therapeutic exercise;Balance training;Neuromuscular re-education;Patient/family education    PT Goals (Current goals can be found in the Care Plan section)  Acute Rehab PT Goals Patient Stated Goal: decrease pain PT Goal Formulation: With patient Time For Goal Achievement: 05/27/22 Potential to Achieve Goals: Good    Frequency Min 3X/week     Co-evaluation               AM-PAC PT "6 Clicks" Mobility  Outcome Measure Help needed turning from your back to your side while in a flat bed without using bedrails?: None Help needed moving from lying on your back to sitting on the side of a flat bed without using bedrails?: None Help needed moving to and from a bed to a chair (including a wheelchair)?: None Help needed standing up from a chair using your arms (e.g., wheelchair or bedside chair)?: None Help needed to walk in hospital room?: None Help needed climbing 3-5 steps with a  railing? : A Little 6 Click Score: 23    End of Session Equipment Utilized During Treatment: Oxygen Activity Tolerance: Patient limited by fatigue Patient left: with call bell/phone within reach Nurse Communication: Mobility status;Other (comment) (pt self-cathing and requesting privacy) PT Visit Diagnosis: Other abnormalities of gait and mobility (R26.89)    Time: 1610-9604 PT Time Calculation (min) (ACUTE ONLY): 11 min   Charges:   PT Evaluation $PT Eval Low Complexity: 1 Low          Eduard Clos, PT, DPT  Acute Rehabilitation Services Office [place] 05/13/2022, 9:32 AM

## 2022-05-13 NOTE — Telephone Encounter (Signed)
Pharmacy Patient Advocate Encounter  Insurance verification completed.    The patient is insured through [facility] [place]   The patient is currently admitted and ran test claims for the following: Eliquis, Xarelto, Warfarin.  Copays and coinsurance results were relayed to Inpatient clinical team.

## 2022-05-13 NOTE — TOC Transition Note (Signed)
Transition of Care [facility]) - CM/SW Discharge Note   Patient Details  Name: Bradley Colon MRN: [id number] Date of Birth: 04-Dec-1970  Transition of Care [facility]) CM/SW Contact:  Cyndi Bender, RN Phone Number: 05/13/2022, 4:00 PM   Clinical Narrative:    Patient stable for discharge.  Patient has a portable tank for transport home.  No other TOC needs at this time.     Barriers to Discharge: Continued Medical Work up   Patient Goals and CMS Choice Patient states their goals for this hospitalization and ongoing recovery are:: go to cancer hospital in [facility]      Discharge Placement                       Discharge Plan and Services   Discharge Planning Services: CM Consult            DME Arranged: Oxygen DME Agency: [employer] Date DME Agency Contacted: 05/12/22 Time DME Agency Contacted: [id number] Representative spoke with at DME Agency: [place] Interventions     Readmission Risk Interventions    05/13/2022    3:59 PM 01/01/2022   12:39 PM  Readmission Risk Prevention Plan  Transportation Screening Complete Complete  PCP or Specialist Appt within 3-5 Days Complete Complete  HRI or [place] Complete Complete  Social Work Consult for [place] Planning/Counseling Complete   Palliative Care Screening Complete Not Applicable  Medication Review [employer] photographer) Complete Complete

## 2022-05-13 NOTE — Progress Notes (Signed)
Spoke with Caren Griffins from [employer]. Spoke in regards to 2 new oxygen tanks being delivered within next hour or so. Patient is currently waiting on portable oxygen to be safely discharge. Will relay information over to oncoming PM RN.

## 2022-05-13 NOTE — Progress Notes (Signed)
Pt c/o having sharp burning type pain from base of  right buttock down RLE. Pt states at home he uses a heating pad but is always rubbing it to help decrease the discomfort. Told pt r/t location and type of pain sounds like it could be sciatic or neuropathy pain. Pt stated he used to be on Neuronitn 600 mg but because of Kidney issues the doctor decreased dose to 300 mg and every since he can't comfortable because of the discomfort.

## 2022-05-13 NOTE — Progress Notes (Signed)
  Echocardiogram 2D Echocardiogram has been performed.  Merrie Roof F 05/13/2022, 2:51 PM

## 2022-05-14 ENCOUNTER — Other Ambulatory Visit (HOSPITAL_COMMUNITY): Payer: Self-pay

## 2022-05-14 ENCOUNTER — Other Ambulatory Visit: Payer: Self-pay | Admitting: Nurse Practitioner

## 2022-05-14 ENCOUNTER — Telehealth: Payer: Self-pay

## 2022-05-14 MED ORDER — OXYCODONE HCL 20 MG PO TABS
20.0000 mg | ORAL_TABLET | Freq: Three times a day (TID) | ORAL | 0 refills | Status: DC | PRN
  Filled 2022-05-14: qty 90, 30d supply, fill #0

## 2022-05-14 MED ORDER — MORPHINE SULFATE ER 60 MG PO TBCR
60.0000 mg | EXTENDED_RELEASE_TABLET | Freq: Two times a day (BID) | ORAL | 0 refills | Status: DC
  Filled 2022-05-14 (×2): qty 60, 30d supply, fill #0

## 2022-05-14 NOTE — Telephone Encounter (Signed)
Pt called for medication refill, sent to pharmacy, pt notifed, no further needs.

## 2022-05-14 NOTE — Telephone Encounter (Signed)
Transition Care Management Follow-up Telephone Call Date of discharge and from where: 05/13/2022, [facility]  How have you been since you were released from the hospital? He said he feels like he is suffocating and it is terrifying him.  He has been using the O2 @ 3-4 L but needs to increase to 7-8 L with even minimal exertion.  He has 3 short hair dogs at home and they aggravated his breathing so he went to his mother's home.  He is planning to go to [facility] in [place] tomorrow night and will go straight to ED there and request transfer to [facility].  He has been in contact with [facility] and they are working on getting authorization from his insurance company. [employer] delivered 3 O2 tanks that will each last about 4 hours at the rate he is currently using the O2.  He is fearful of running out of O2 on the way to [facility] and has requested a high flow POC from [place]. If they are not able to provide that he understands he would need to go to call 911/go to ED if e wa sin need of O2.  He also understands that [facility] has offices along his route and he can change out his tanks at those locations but he should discuss this with [place] first.  Any questions or concerns? Yes- noted above  Items Reviewed: Did the pt receive and understand the discharge instructions provided? Yes  Medications obtained and verified? Yes - he didn't have any questions about the med regime Other? No  Any new allergies since your discharge? No  Dietary orders reviewed? No Do you have support at home? Yes   Home Care and Equipment/Supplies: Were home health services ordered? no If so, what is the name of the agency? N/a  Has the agency set up a time to come to the patient's home? not applicable Were any new equipment or medical supplies ordered?  Yes: O2 What is the name of the medical supply agency? [employer] [facility]  Were you able to get the supplies/equipment? yes Do you have any  questions related to the use of the equipment or supplies? Yes: he said that the concentrator is but it will not provide a high enough liter flow for him.He has been in contact with [place] about obtaining a high flow POC. He is not able to tolerate pulse dosing.    Functional Questionnaire: (I = Independent and D = Dependent) ADLs: independent but fatigues very easily with little exertion.    Follow up appointments reviewed:  PCP Hospital f/u appt confirmed?  He is not scheduling an appointment at this time   [facility] f/u appt confirmed?  He plans to follow up at [facility] Are transportation arrangements needed? No  If their condition worsens, is the pt aware to call PCP or go to the Emergency Dept.? Yes Was the patient provided with contact information for the PCP's office or ED? Yes- he has the phone number Was to pt encouraged to call back with questions or concerns? Yes

## 2022-05-15 ENCOUNTER — Encounter (HOSPITAL_COMMUNITY): Payer: Self-pay | Admitting: Emergency Medicine

## 2022-05-15 ENCOUNTER — Emergency Department (HOSPITAL_COMMUNITY)
Admission: EM | Admit: 2022-05-15 | Discharge: 2022-05-15 | Disposition: A | Discharge status: Home / Self Care | Payer: Medicaid Other | Attending: Emergency Medicine | Admitting: Emergency Medicine

## 2022-05-15 DIAGNOSIS — Z7901 Long term (current) use of anticoagulants: Secondary | ICD-10-CM | POA: Insufficient documentation

## 2022-05-15 DIAGNOSIS — Z91040 Latex allergy status: Secondary | ICD-10-CM | POA: Diagnosis not present

## 2022-05-15 DIAGNOSIS — R0602 Shortness of breath: Secondary | ICD-10-CM | POA: Insufficient documentation

## 2022-05-15 DIAGNOSIS — R0902 Hypoxemia: Secondary | ICD-10-CM | POA: Diagnosis not present

## 2022-05-15 DIAGNOSIS — J9601 Acute respiratory failure with hypoxia: Secondary | ICD-10-CM | POA: Diagnosis not present

## 2022-05-15 MED ORDER — LORAZEPAM 1 MG PO TABS: 1.0000 mg | ORAL_TABLET | Freq: Three times a day (TID) | ORAL | 0 refills | Status: DC | PRN

## 2022-05-15 NOTE — ED Provider Notes (Addendum)
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 male. Patient presents with a problem with his oxygen therapy.  He states that he was recently discharged from our facility and was prescribed home oxygen.  He says prior to discharge they ambulated him and he did well on 4 to 6 L of oxygen.  He says that a doctor told him that he was going to get a prescription up to 6 L, but the prescription is only for 4 L.  States when he has been at home he has been using home oxygen anywhere from 4 to 6 L.  He states at times when he is at 4 L he starts experiencing some shortness of breath, but he increase his oxygen and this goes away. He says that this is unchanged from discharge.  He states that the doctors from [facility] health are not offering any further treatment for his cancer.  He is planning on going up to [place] today to get a second opinion, however he is concerned that he will run out of home oxygen. He has no new complaints.   Recent hospitalization information reviewed. He is no longer a candidate for chemo due to CKD. He also was found to have worsening lung and liver nets. They think he likely had a PE so he is now on Eliquis therapy and compliant with this. He has been recommended to consult with Palliative care/Hospice, however patient has had a lot of denial regarding his diagnosis and severity of cancer diagnosis.   HPI     Home Medications Prior to Admission medications   Medication Sig Start Date End Date Taking? Authorizing Provider  acetaminophen (TYLENOL) 500 MG tablet Take 1,000 mg by mouth 2 (two) times daily.    [provider]  albuterol (VENTOLIN HFA) 108 (90 Base) MCG/ACT inhaler Inhale 2 puffs into the lungs every 6 (six) hours as needed for  wheezing or shortness of breath. 05/02/22 05/02/23  Pickenpack-Cousar, Carlena Sax, NP  amoxicillin-clavulanate (AUGMENTIN) 500-125 MG tablet Take 1 tablet (500 mg total) by mouth every 12 (twelve) hours for 5 days. 05/13/22 05/18/22  Ghimire, Henreitta Leber, MD  apixaban (ELIQUIS) 5 MG TABS tablet Take one tablet by mouth twice daily. 05/14/22   Ghimire, Henreitta Leber, MD  APIXABAN Arne Cleveland) VTE STARTER PACK (10MG  AND 5MG ) Take as directed per package 05/13/22   Jonetta Osgood, MD  Ferrous Sulfate (IRON PO) Take 1 tablet by mouth daily.    [provider]  gabapentin (NEURONTIN) 300 MG capsule Take 1 capsule (300 mg total) by mouth 2 (two) times daily. Pt is taking one cap in AM and one in PM per nephrologist Patient taking differently: Take 300 mg by mouth 2 (two) times daily. 04/23/22   Pickenpack-Cousar, Carlena Sax, NP  Homeopathic Products (ARNICARE ARNICA) CREA Apply 1 Application topically 2 (two) times daily as needed (leg pain).    [provider]  loratadine (CLARITIN) 10 MG tablet Take 10 mg by mouth daily as needed for allergies.    [provider]  morphine (MS CONTIN) 60 MG 12 hr tablet Take 1 tablet by mouth every 12 hours. 05/14/22   Pickenpack-Cousar, Carlena Sax, NP  Oxycodone HCl 20 MG TABS Take 1 tablet  by mouth 3 times daily as needed for severe pain. 05/14/22   Pickenpack-Cousar, Carlena Sax, NP  sodium bicarbonate 650 MG tablet Take 650 mg by mouth 3 (three) times daily. 05/08/22   [provider]  tamsulosin (FLOMAX) 0.4 MG CAPS capsule TAKE 2 CAPSULES EVERY DAY Patient taking differently: Take 0.8 mg by mouth daily. 04/03/22   Acquanetta Chain, DO      Allergies    Bee venom, Other, Contrast media [iodinated contrast media], Shrimp [shellfish allergy], Vancocin [vancomycin], Wheat bran, and Latex    Review of Systems   Review of Systems  All other systems reviewed and are negative.   Physical Exam Updated Vital Signs BP 105/79   Pulse 84   Temp 98 F  (36.7 C) (Oral)   Resp 18   SpO2 99%  Physical Exam Vitals and nursing note reviewed.  Constitutional:      General: He is not in acute distress.    Appearance: Normal appearance. He is not ill-appearing, toxic-appearing or diaphoretic.  HENT:     Head: Normocephalic and atraumatic.     Nose: No nasal deformity.     Mouth/Throat:     Lips: Pink. No lesions.     Mouth: Mucous membranes are moist. No injury, lacerations, oral lesions or angioedema.     Pharynx: Oropharynx is clear. Uvula midline. No pharyngeal swelling, oropharyngeal exudate, posterior oropharyngeal erythema or uvula swelling.  Eyes:     General: Gaze aligned appropriately. No scleral icterus.       Right eye: No discharge.        Left eye: No discharge.     Conjunctiva/sclera: Conjunctivae normal.     Right eye: Right conjunctiva is not injected. No exudate or hemorrhage.    Left eye: Left conjunctiva is not injected. No exudate or hemorrhage. Cardiovascular:     Rate and Rhythm: Normal rate and regular rhythm.     Pulses: Normal pulses.          Radial pulses are 2+ on the right side and 2+ on the left side.       Dorsalis pedis pulses are 2+ on the right side and 2+ on the left side.     Heart sounds: Normal heart sounds, S1 normal and S2 normal. Heart sounds not distant. No murmur heard.    No friction rub. No gallop. No S3 or S4 sounds.  Pulmonary:     Effort: Pulmonary effort is normal. No accessory muscle usage or respiratory distress.     Breath sounds: Normal breath sounds. No stridor. No wheezing, rhonchi or rales.     Comments: On 5 L [place] O2 Chest:     Chest wall: No tenderness.  Abdominal:     General: Abdomen is flat. There is no distension.     Palpations: Abdomen is soft. There is no mass or pulsatile mass.     Tenderness: There is no abdominal tenderness. There is no guarding or rebound.  Musculoskeletal:     Right lower leg: No edema.     Left lower leg: No edema.  Skin:    General: Skin is  warm and dry.     Coloration: Skin is not jaundiced or pale.     Findings: No bruising, erythema, lesion or rash.  Neurological:     General: No focal deficit present.     Mental Status: He is alert and oriented to person, place, and time.     GCS: GCS eye subscore is  4. GCS verbal subscore is 5. GCS motor subscore is 6.  Psychiatric:        Mood and Affect: Mood normal.        Behavior: Behavior normal. Behavior is cooperative.     ED Results / Procedures / Treatments   Labs (all labs ordered are listed, but only abnormal results are displayed) Labs Reviewed - No data to display  EKG None  Radiology No results found.  Procedures Procedures  This patient was on telemetry or cardiac monitoring during their time in the ED.    Medications Ordered in ED Medications - No data to display  ED Course/ Medical Decision Making/ A&P Clinical Course as of 05/15/22 1848  Thu May 15, 2022  1606 I discussed case with case Freight forwarder. Plan to do a qualifying ambulation test to assess if he qualifies for increased oxygen needs.  [GL]  1610 I discussed case with Mariann Laster, case Freight forwarder. Plans to call me back regarding if patient qualifies for increased O2 or not.  [GL]  9604 Talked to patient once again. He is willing to do ambulation trial for O2 needs  [GL]    Clinical Course User Index [GL] Trey Gulbranson, Adora Fridge, PA-C                           Medical Decision Making   MDM  This is a 51 y.o. male who presents to the ED with home O2 problem  Initial Impression  Is a patient who has stage IV rectal carcinoma with mets to his liver and lungs and recent hospitalization for worsening respiratory failure secondary to lung mets and likely pulmonary embolism.  He has been on Eliquis since then.  He was discharged from the hospital on 4 L of oxygen, however per patient he states that he was supposed to be up to 6 L at home.  He is running into problems with getting his oxygen filled at home because  the prescription is an accurate.  He has no new complaints today.  He has no respiratory distress. Low suspicion for secondary etiology causing increased O2 need.  I have reviewed discharge summary from 7/11, which does not specify home Oxygen  prescription limits.    I do not think that there is any acute changes and that patient likely needed a higher O2 level at discharge. No labs or further workup indicated.  I have discussed this case with case manager on call. Plan to do ambulation trial to assess O2 needs.  RN and tech ambulated patient and he desaturated to < 88% on 4 L O2. I have placed home O2 order and we are setting up a new O2 device for patient. He is stable for discharge once he gets the correct device and settings.   @1900 , I was pulled into the room to discuss oxygen needs with patient who is concerned that he does not have enough O2 to get to [place] and needs a portable tank now. I have reinvolved case management who will speak with patient as this is not likely to be a possible solution.   Dispo is pending Case Management discussion. I have signed patient out to Dr. Eulis Foster for any further management needed.   Charting Requirements Additional history is obtained from:  Independent historian External Records from outside source obtained and reviewed including: Recent hospitalization Social Determinants of Health:  none Pertinant PMH that complicates patient's illness: Stage IV rectal cancer with  lung and liver mets  Patient Care Problems that were addressed during this visit: - Oxygen Desaturation: Acute illness with complication This patient was maintained on a cardiac monitor/telemetry. I personally viewed and interpreted the cardiac monitor which reveals an underlying rhythm of NSR Medications given in ED: n/a Reevaluation of the patient after these medicines showed that the patient stayed the same I have reviewed home medications and made changes accordingly.   Critical Care Interventions: n/a Consultations: Case Management Disposition: discharge  This is a supervised visit with my attending physician, Dr. Langston Masker. We have discussed this patient and they have altered the plan as needed.  Portions of this note were generated with Lobbyist. Dictation errors may occur despite best attempts at proofreading.     Final Clinical Impression(s) / ED Diagnoses Final diagnoses:  Oxygen desaturation    Rx / DC Orders ED Discharge Orders     None         Adolphus Birchwood, PA-C 05/15/22 1848    Adolphus Birchwood, PA-C 05/15/22 1932    Wyvonnia Dusky, MD 05/16/22 [phone]

## 2022-05-15 NOTE — ED Notes (Addendum)
Tried ambulating patient to bathroom, with oxygen turned off to see oxygen on room air. Patient dropped to 55%, tech put patient on 6L and patients oxygen went to 88%.

## 2022-05-15 NOTE — Discharge Instructions (Signed)
We have gotten your oxygen concerns worked out. You are now able to go up to 6 L O2.

## 2022-05-15 NOTE — Discharge Planning (Signed)
RNCM consulted regarding pt oxygen needs.  RNCM reviews notes form Opal Sidles, [facility] in [facility] and [place] to find that pt was given resources for travel to [place] today.  RNCM reached out to [facility] Pam Drown [phone]) and made him aware of pt concerns.  RNCM will continue to follow.  Rhonda Vangieson J. Clydene Laming, RN, BSN, NCM  Transitions of Care  Nurse Case Manager  [facility] Emergency Departments  Operative Services  [phone]

## 2022-05-15 NOTE — ED Notes (Signed)
Got patient on the bed into a gown on the monitor got patient some warm blankets patient is resting with call bell in reach

## 2022-05-15 NOTE — Progress Notes (Signed)
Transition of Care [facility]) - Emergency Department Mini Assessment   Patient Details  Name: Bradley Colon MRN: [id number] Date of Birth: [birth year]  Transition of Care [facility]) CM/SW Contact:    Fuller Mandril, RN Phone Number: 05/15/2022, 2:33 PM   Clinical Narrative: RNCM met with pt at bedside who says he was told to come to ED to qualify for higher oxygen flow tanks.  RNCM relayed information to EDP and RN and plan to discharge pt with appropriate oxygen tanks after qualifying SATs have been obtained.      ED Mini Assessment: What brought you to the Emergency Department? : (P) need to qualify for higher flow oxygen  Barriers to Discharge: (P) Continued Medical Work up  [employer] interventions: (P) oxygen qualification  Means of departure: (P) Car  Interventions which prevented an admission or readmission: (P) DME Provided    Patient Contact and Communications        ,          Patient states their goals for this hospitalization and ongoing recovery are:: (P) go to cancer hospital in [place] and get appropriate oxygen tanks      Admission diagnosis:  tanks are too low,need a new rx Patient Active Problem List   Diagnosis Date Noted   Acute respiratory failure with hypoxia ([place]) 05/10/2022   Obstructive nephropathy due to neurogenic bladder 05/10/2022   Cancer associated pain 05/10/2022   Anemia of chronic disease 05/10/2022   Pressure injury of skin 12/28/2021   ARF (acute renal failure) ([place]) 12/27/2021   Hyponatremia 12/27/2021   Seizure ([place]) 12/27/2021   Essential hypertension 12/27/2021   Bacterial infection due to [place] 12/05/2021   Severe sepsis ([place]) due to UTI / pyelonephritis 12/04/2021   Hyperkalemia 12/04/2021   Acute kidney injury (AKI) with acute tubular necrosis (ATN) ([place]) 00/86/7619   Acute metabolic encephalopathy 50/93/2671   Grand mal seizure ([place]) 12/04/2021   Acute bacterial pyelonephritis 24/58/0998   Metabolic acidosis 33/82/5053    Hypoglycemia 12/04/2021   Urinary incontinence 11/27/2021   Small bowel obstruction ([place]) 10/15/2021   Palliative care patient 10/11/2021   Lung nodules 07/10/2021   Goals of care, counseling/discussion 10/25/2020   Pelvic and perineal pain 09/12/2020   Post-procedural erectile dysfunction 04/25/2020   Port-A-Cath in place 04/12/2020   Hydrocele 04/11/2020   On antineoplastic chemotherapy 08/19/2019   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[place]) 03/02/2019   Reactive airway disease 03/02/2019   Vegetarian 03/02/2019   Adjustment disorder with mixed anxiety and depressed mood 03/02/2019   Chronic pain after traumatic injury 03/02/2019   Change in bowel habits 01/31/2019   Constipation 01/31/2019   Grade III hemorrhoids 01/31/2019   Rectal pain 01/31/2019   Fibromyalgia 12/18/2017   Dysesthesia affecting both sides of body 12/18/2017   Spondylosis without myelopathy or radiculopathy, lumbar region 12/18/2017   Chronic post-traumatic stress disorder (PTSD) 05/25/2017   Other fatigue 07/27/2016   Chronic left shoulder pain 07/22/2016   Chronic neck pain 11/19/2015   Chronic low back pain 09/18/2014   PCP:  Dorna Mai, MD Pharmacy:   [id number] [place] [id number]: [phone]: [phone] [facility] [address]: [phone]: [phone]

## 2022-05-15 NOTE — ED Notes (Addendum)
Pt o2 dropped to 91 % when standing  from 100 % and pt felt really SOB. Pt self cath and o2 started to go up a little while still on 4 but pt request that he feels really bad and need the o2 to be bumped up.

## 2022-05-15 NOTE — ED Triage Notes (Signed)
Patient here because he says he was recently discharged from the hospital and the prescription for oxygen that was sent to [employer] (company that supplies his O2 tanks) does not match his O2 requirements and the flow regulators on his tanks only increase to 4L/min. Patient called [employer] who attempted multiple times to contact his physician but they have not been able to get his prescription changed.

## 2022-05-15 NOTE — ED Notes (Signed)
SATURATION QUALIFICATIONS: (This note is used to comply with regulatory documentation for home oxygen)  Patient Saturations on Room Air at Rest = 89%  Patient Saturations on Room Air while Ambulating = 55%  Patient Saturations on 6 Liters of oxygen while Ambulating = 88%  Please briefly explain why patient needs home oxygen: With any movement he desats & it takes more O2 than usual to get him above 88%.

## 2022-05-16 DIAGNOSIS — M6281 Muscle weakness (generalized): Secondary | ICD-10-CM | POA: Diagnosis not present

## 2022-05-16 LAB — CULTURE, BLOOD (ROUTINE X 2)
Culture: NO GROWTH
Culture: NO GROWTH
Special Requests: ADEQUATE
Special Requests: ADEQUATE

## 2022-05-17 DIAGNOSIS — R918 Other nonspecific abnormal finding of lung field: Secondary | ICD-10-CM | POA: Diagnosis not present

## 2022-05-17 DIAGNOSIS — Z859 Personal history of malignant neoplasm, unspecified: Secondary | ICD-10-CM | POA: Diagnosis not present

## 2022-05-19 DIAGNOSIS — C20 Malignant neoplasm of rectum: Secondary | ICD-10-CM | POA: Diagnosis not present

## 2022-05-20 DIAGNOSIS — C20 Malignant neoplasm of rectum: Secondary | ICD-10-CM | POA: Diagnosis not present

## 2022-05-20 DIAGNOSIS — N179 Acute kidney failure, unspecified: Secondary | ICD-10-CM | POA: Diagnosis not present

## 2022-05-20 DIAGNOSIS — N1330 Unspecified hydronephrosis: Secondary | ICD-10-CM | POA: Diagnosis not present

## 2022-05-20 DIAGNOSIS — M79605 Pain in left leg: Secondary | ICD-10-CM | POA: Diagnosis not present

## 2022-05-22 DIAGNOSIS — J189 Pneumonia, unspecified organism: Secondary | ICD-10-CM | POA: Diagnosis not present

## 2022-05-22 DIAGNOSIS — C20 Malignant neoplasm of rectum: Secondary | ICD-10-CM | POA: Diagnosis not present

## 2022-05-22 DIAGNOSIS — C787 Secondary malignant neoplasm of liver and intrahepatic bile duct: Secondary | ICD-10-CM | POA: Diagnosis not present

## 2022-05-22 DIAGNOSIS — J90 Pleural effusion, not elsewhere classified: Secondary | ICD-10-CM | POA: Diagnosis not present

## 2022-05-23 ENCOUNTER — Inpatient Hospital Stay: Payer: Medicaid Other | Admitting: Hematology and Oncology

## 2022-05-23 ENCOUNTER — Inpatient Hospital Stay: Payer: Medicaid Other | Attending: Nurse Practitioner

## 2022-05-23 DIAGNOSIS — C20 Malignant neoplasm of rectum: Secondary | ICD-10-CM | POA: Diagnosis not present

## 2022-05-24 DIAGNOSIS — R339 Retention of urine, unspecified: Secondary | ICD-10-CM | POA: Diagnosis not present

## 2022-05-26 ENCOUNTER — Telehealth: Payer: Self-pay

## 2022-05-26 NOTE — Telephone Encounter (Signed)
Pt called to report he is doing well and made it to [place], no further questions or needs at this time

## 2022-05-29 DIAGNOSIS — C20 Malignant neoplasm of rectum: Secondary | ICD-10-CM | POA: Diagnosis not present

## 2022-05-29 DIAGNOSIS — R0602 Shortness of breath: Secondary | ICD-10-CM | POA: Diagnosis not present

## 2022-05-30 DIAGNOSIS — C20 Malignant neoplasm of rectum: Secondary | ICD-10-CM | POA: Diagnosis not present

## 2022-05-31 DIAGNOSIS — C20 Malignant neoplasm of rectum: Secondary | ICD-10-CM | POA: Diagnosis not present

## 2022-06-01 DIAGNOSIS — C20 Malignant neoplasm of rectum: Secondary | ICD-10-CM | POA: Diagnosis not present

## 2022-06-02 DIAGNOSIS — C20 Malignant neoplasm of rectum: Secondary | ICD-10-CM | POA: Diagnosis not present

## 2022-06-03 DIAGNOSIS — C20 Malignant neoplasm of rectum: Secondary | ICD-10-CM | POA: Diagnosis not present

## 2022-06-05 DIAGNOSIS — C20 Malignant neoplasm of rectum: Secondary | ICD-10-CM | POA: Diagnosis not present

## 2022-06-06 DIAGNOSIS — C20 Malignant neoplasm of rectum: Secondary | ICD-10-CM | POA: Diagnosis not present

## 2022-07-04 DEATH — deceased

## 2022-07-18 NOTE — Progress Notes (Signed)
Palliative Care Visit

## 2022-10-17 IMAGING — CT CT HEAD W/O CM
3 of 4 series · 15 of 47 positions shown, 18 images · non-contrast
Comparison: MRI brain 12/05/2021, CT brain 08/17/2010

CLINICAL DATA: Mental status change



[Series 4: head 2.0 h70h · axial · 0.43mm/px · z∈[-188,-60]mm · 9 of 80 slices shown, 12 images]
[im 8/80  brain]
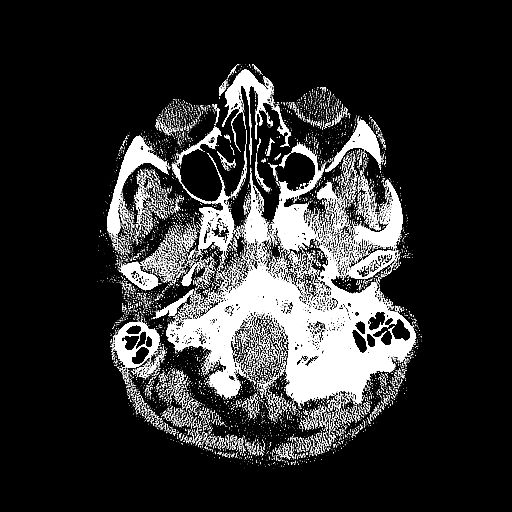
[im 8/80  bone]
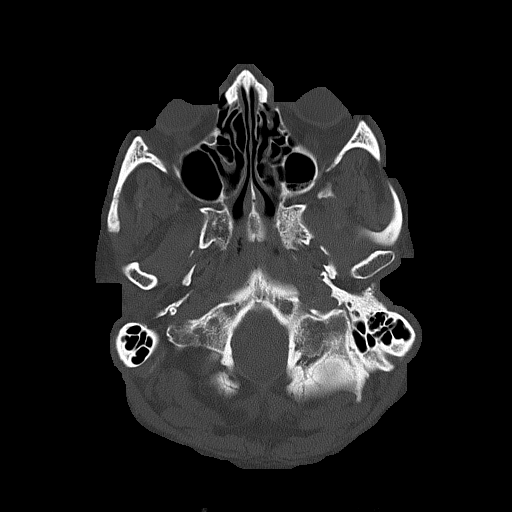
[im 16/80  brain]
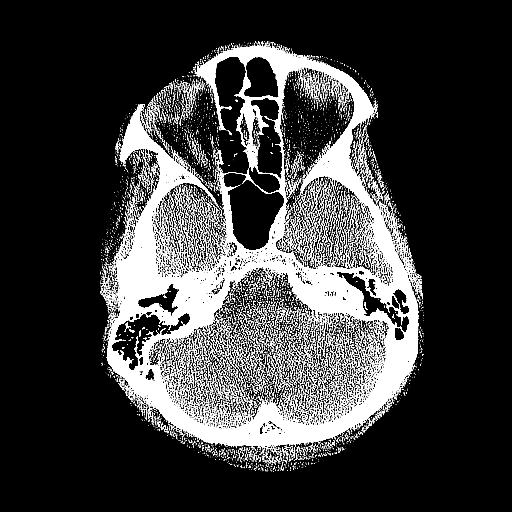
[im 24/80  brain]
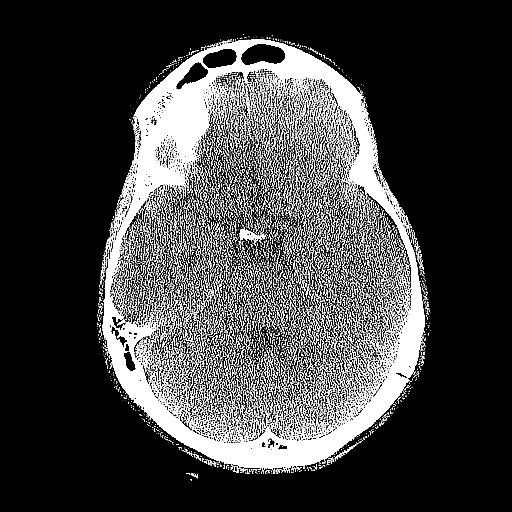
[im 32/80  brain]
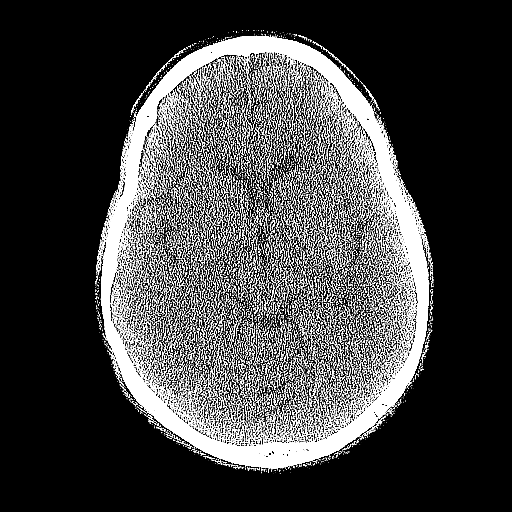
[im 40/80  brain]
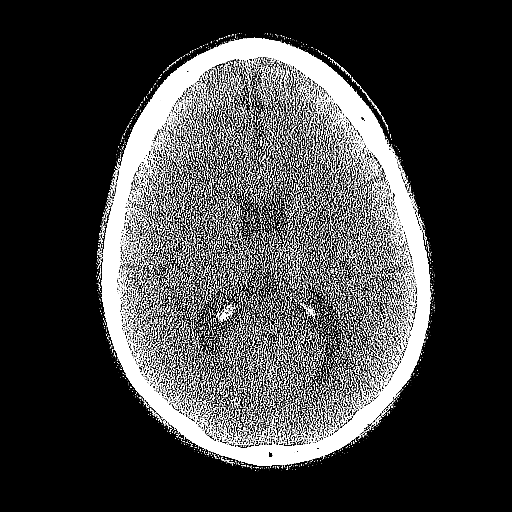
[im 40/80  bone]
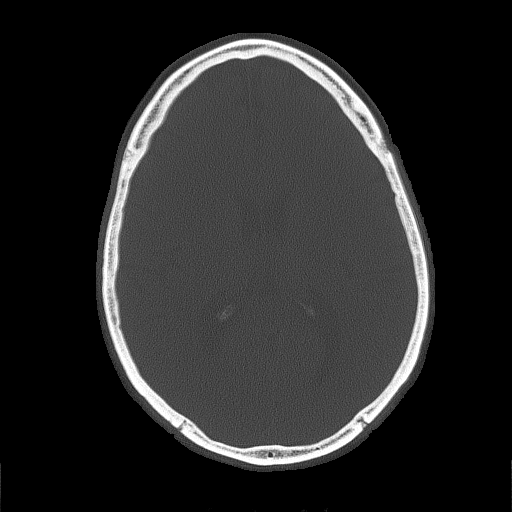
[im 48/80  brain]
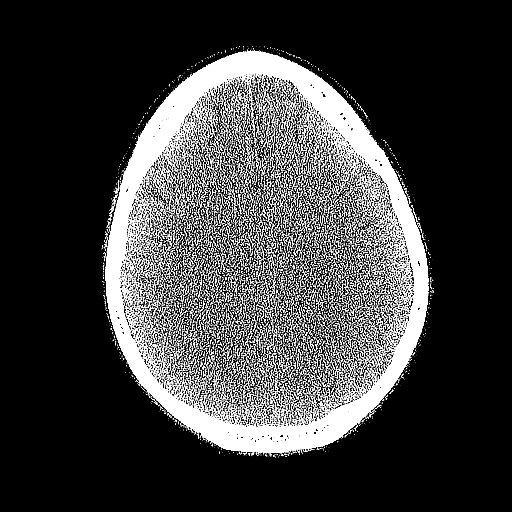
[im 56/80  brain]
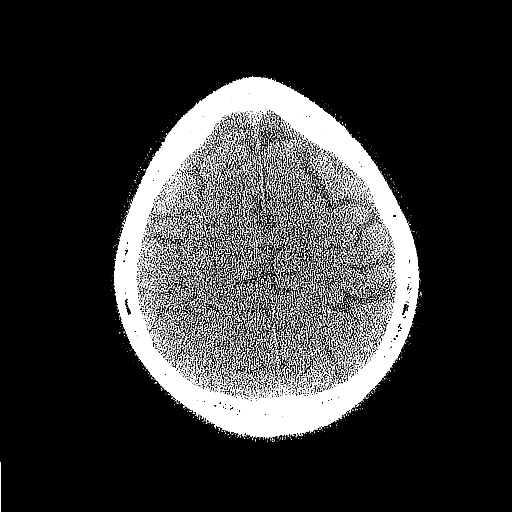
[im 64/80  brain]
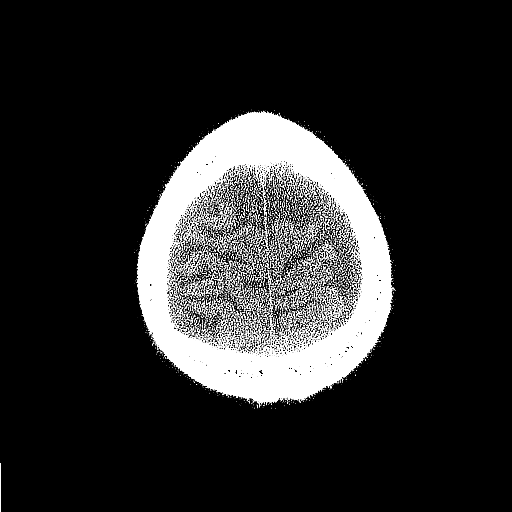
[im 72/80  brain]
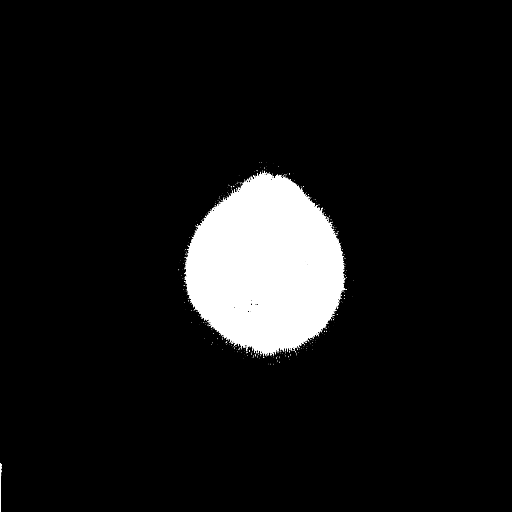
[im 72/80  bone]
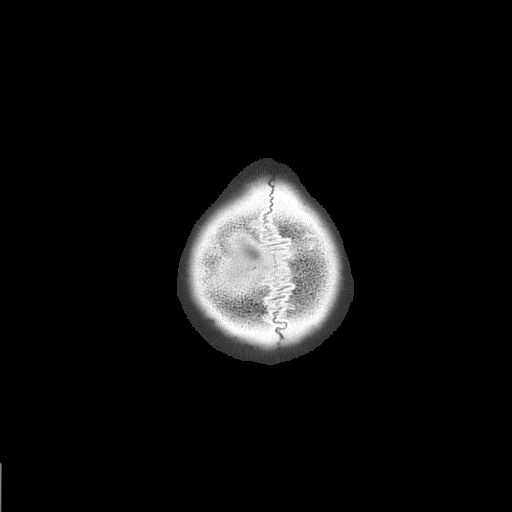

[Series 5: head 3.0 mpr cor · coronal · 0.33mm/px · 3 of 70 slices shown]
[im 24/70  brain]
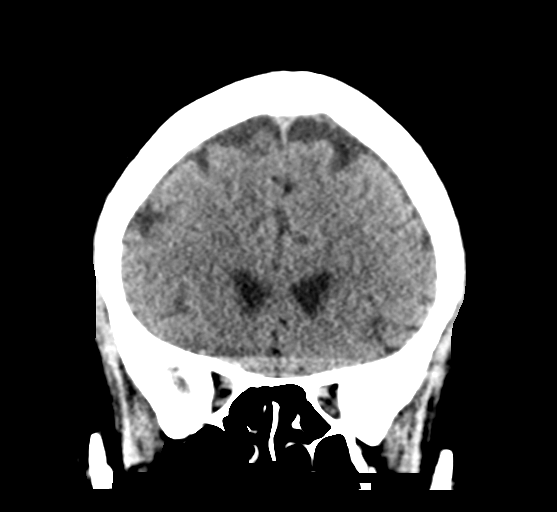
[im 31/70  brain]
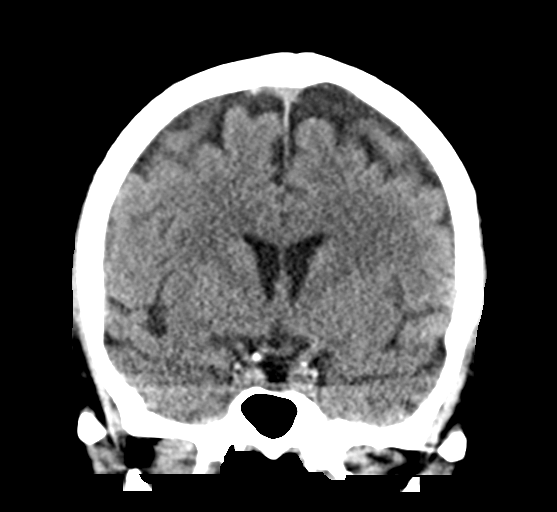
[im 39/70  brain]
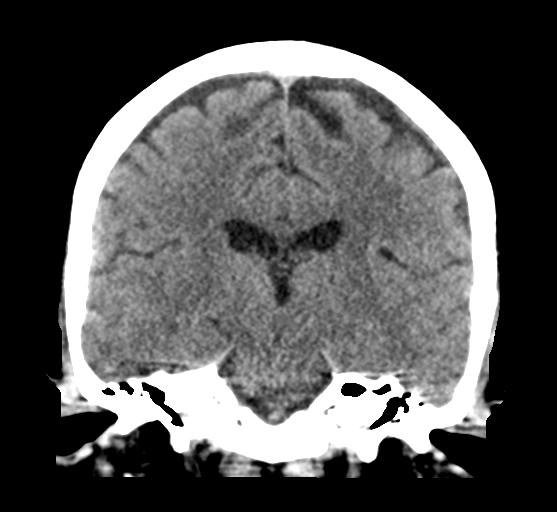

[Series 6: head 3.0 mpr sag · sagittal · 0.33mm/px · 3 of 67 slices shown]
[im 23/67  brain]
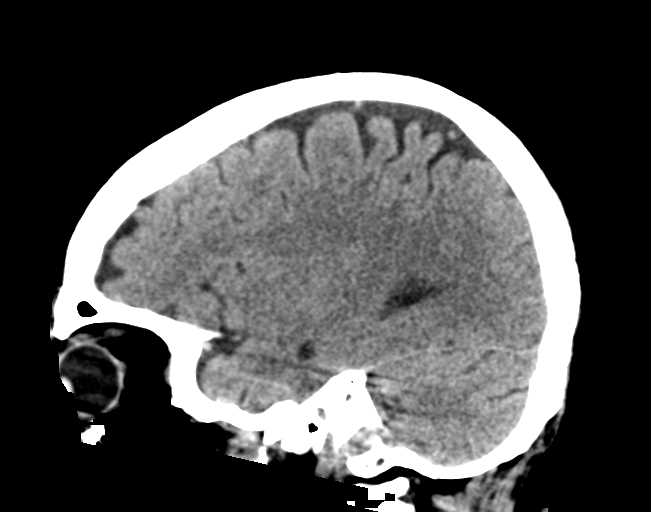
[im 34/67  brain]
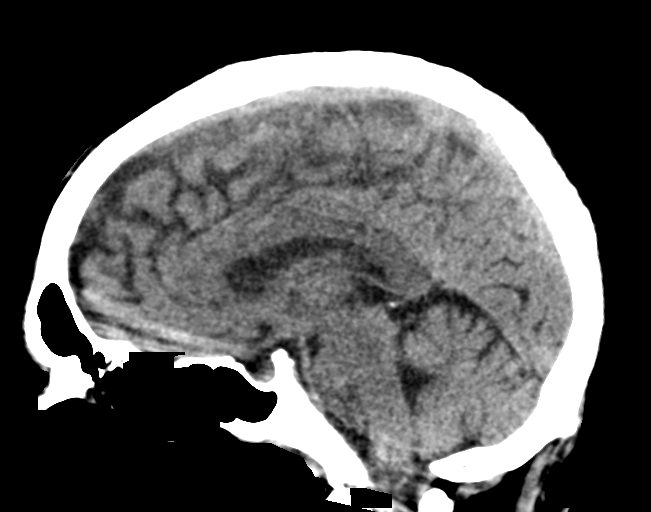
[im 45/67  brain]
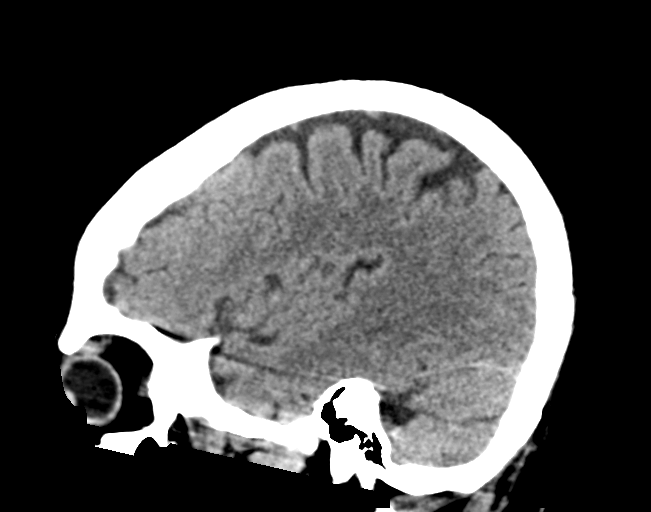

[15 of 47 positions shown; findings below may reference images not displayed]

FINDINGS: Brain: No acute territorial infarction, hemorrhage or intracranial
mass. The ventricles are nonenlarged.

Vascular: No hyperdense vessels.  No unexpected calcification.

Skull: Normal. Negative for fracture or focal lesion.

Sinuses/Orbits: Small fluid level left maxillary sinus

Other: None
IMPRESSION: 1. No CT evidence for acute intracranial abnormality.
2. Left maxillary sinusitis
